# Patient Record
Sex: Female | Born: 1935 | Race: White | Hispanic: No | Marital: Married | Smoking: Former smoker
Health system: Southern US, Community
[De-identification: ages and names within clinical notes are randomized; demographics above are authoritative.]

## PROBLEM LIST (undated history)

## (undated) DIAGNOSIS — I471 Supraventricular tachycardia, unspecified: Secondary | ICD-10-CM

## (undated) DIAGNOSIS — C50919 Malignant neoplasm of unspecified site of unspecified female breast: Secondary | ICD-10-CM

## (undated) DIAGNOSIS — D649 Anemia, unspecified: Secondary | ICD-10-CM

## (undated) DIAGNOSIS — M1990 Unspecified osteoarthritis, unspecified site: Secondary | ICD-10-CM

## (undated) DIAGNOSIS — C9210 Chronic myeloid leukemia, BCR/ABL-positive, not having achieved remission: Secondary | ICD-10-CM

## (undated) HISTORY — PX: EXCISION MORTON'S NEUROMA: SHX5013

## (undated) HISTORY — PX: BREAST SURGERY: SHX581

## (undated) HISTORY — DX: Malignant neoplasm of unspecified site of unspecified female breast: C50.919

## (undated) HISTORY — DX: Unspecified osteoarthritis, unspecified site: M19.90

## (undated) HISTORY — DX: Anemia, unspecified: D64.9

## (undated) HISTORY — PX: KNEE SURGERY: SHX244

## (undated) HISTORY — PX: APPENDECTOMY: SHX54

## (undated) HISTORY — PX: COLONOSCOPY: SHX174

## (undated) HISTORY — DX: Chronic myeloid leukemia, BCR/ABL-positive, not having achieved remission: C92.10

## (undated) HISTORY — PX: ABDOMINAL HYSTERECTOMY: SHX81

## (undated) HISTORY — PX: OTHER SURGICAL HISTORY: SHX169

---

## 1991-05-01 DIAGNOSIS — C50919 Malignant neoplasm of unspecified site of unspecified female breast: Secondary | ICD-10-CM

## 1991-05-01 HISTORY — DX: Malignant neoplasm of unspecified site of unspecified female breast: C50.919

## 1998-06-14 ENCOUNTER — Other Ambulatory Visit: Admission: RE | Admit: 1998-06-14 | Discharge: 1998-06-14 | Payer: Self-pay | Admitting: *Deleted

## 1998-07-01 ENCOUNTER — Ambulatory Visit (HOSPITAL_COMMUNITY): Admission: RE | Admit: 1998-07-01 | Discharge: 1998-07-01 | Payer: Self-pay | Admitting: Gastroenterology

## 1999-08-31 ENCOUNTER — Other Ambulatory Visit: Admission: RE | Admit: 1999-08-31 | Discharge: 1999-08-31 | Payer: Self-pay | Admitting: *Deleted

## 2000-08-30 ENCOUNTER — Other Ambulatory Visit: Admission: RE | Admit: 2000-08-30 | Discharge: 2000-08-30 | Payer: Self-pay | Admitting: *Deleted

## 2001-09-08 ENCOUNTER — Other Ambulatory Visit: Admission: RE | Admit: 2001-09-08 | Discharge: 2001-09-08 | Payer: Self-pay | Admitting: *Deleted

## 2003-02-25 ENCOUNTER — Encounter: Payer: Self-pay | Admitting: Internal Medicine

## 2003-03-18 ENCOUNTER — Other Ambulatory Visit: Admission: RE | Admit: 2003-03-18 | Discharge: 2003-03-18 | Payer: Self-pay | Admitting: Oncology

## 2003-03-18 ENCOUNTER — Encounter (INDEPENDENT_AMBULATORY_CARE_PROVIDER_SITE_OTHER): Payer: Self-pay | Admitting: *Deleted

## 2003-03-19 ENCOUNTER — Ambulatory Visit (HOSPITAL_COMMUNITY): Admission: RE | Admit: 2003-03-19 | Discharge: 2003-03-19 | Payer: Self-pay | Admitting: Oncology

## 2003-07-07 ENCOUNTER — Other Ambulatory Visit: Admission: RE | Admit: 2003-07-07 | Discharge: 2003-07-07 | Payer: Self-pay | Admitting: Oncology

## 2003-08-12 ENCOUNTER — Other Ambulatory Visit: Admission: RE | Admit: 2003-08-12 | Discharge: 2003-08-12 | Payer: Self-pay | Admitting: *Deleted

## 2003-09-03 ENCOUNTER — Encounter: Payer: Self-pay | Admitting: Internal Medicine

## 2004-04-11 ENCOUNTER — Ambulatory Visit: Payer: Self-pay | Admitting: Internal Medicine

## 2004-04-13 ENCOUNTER — Ambulatory Visit: Payer: Self-pay | Admitting: Oncology

## 2004-07-07 ENCOUNTER — Ambulatory Visit: Payer: Self-pay | Admitting: Oncology

## 2004-07-21 ENCOUNTER — Encounter (INDEPENDENT_AMBULATORY_CARE_PROVIDER_SITE_OTHER): Payer: Self-pay | Admitting: Specialist

## 2004-07-21 ENCOUNTER — Ambulatory Visit (HOSPITAL_COMMUNITY): Admission: RE | Admit: 2004-07-21 | Discharge: 2004-07-21 | Payer: Self-pay | Admitting: Oncology

## 2004-08-16 ENCOUNTER — Ambulatory Visit: Payer: Self-pay | Admitting: Internal Medicine

## 2004-09-07 ENCOUNTER — Ambulatory Visit: Payer: Self-pay | Admitting: Oncology

## 2005-04-04 ENCOUNTER — Ambulatory Visit: Payer: Self-pay | Admitting: Oncology

## 2005-04-05 ENCOUNTER — Ambulatory Visit: Payer: Self-pay | Admitting: Internal Medicine

## 2005-05-18 ENCOUNTER — Ambulatory Visit: Payer: Self-pay | Admitting: Oncology

## 2005-08-20 ENCOUNTER — Ambulatory Visit: Payer: Self-pay | Admitting: Oncology

## 2005-08-27 ENCOUNTER — Ambulatory Visit (HOSPITAL_COMMUNITY): Admission: RE | Admit: 2005-08-27 | Discharge: 2005-08-27 | Payer: Self-pay | Admitting: Oncology

## 2005-08-27 ENCOUNTER — Encounter (INDEPENDENT_AMBULATORY_CARE_PROVIDER_SITE_OTHER): Payer: Self-pay | Admitting: Specialist

## 2006-03-29 ENCOUNTER — Ambulatory Visit: Payer: Self-pay | Admitting: Oncology

## 2006-04-02 LAB — CBC WITH DIFFERENTIAL (CANCER CENTER ONLY)
BASO%: 0.3 % (ref 0.0–2.0)
EOS%: 2 % (ref 0.0–7.0)
HCT: 35.2 % (ref 34.8–46.6)
LYMPH#: 1 10*3/uL (ref 0.9–3.3)
LYMPH%: 33.9 % (ref 14.0–48.0)
MCHC: 33.7 g/dL (ref 32.0–36.0)
MCV: 111 fL — ABNORMAL HIGH (ref 81–101)
MONO#: 0.3 10*3/uL (ref 0.1–0.9)
NEUT%: 53.4 % (ref 39.6–80.0)
Platelets: 153 10*3/uL (ref 145–400)
RDW: 11.4 % (ref 10.5–14.6)
WBC: 2.9 10*3/uL — ABNORMAL LOW (ref 3.9–10.0)

## 2006-04-04 LAB — COMPREHENSIVE METABOLIC PANEL
ALT: 18 U/L (ref 0–35)
Albumin: 4.7 g/dL (ref 3.5–5.2)
CO2: 27 mEq/L (ref 19–32)
Calcium: 9 mg/dL (ref 8.4–10.5)
Chloride: 104 mEq/L (ref 96–112)
Creatinine, Ser: 0.96 mg/dL (ref 0.40–1.20)
Potassium: 4.2 mEq/L (ref 3.5–5.3)
Sodium: 141 mEq/L (ref 135–145)
Total Protein: 7 g/dL (ref 6.0–8.3)

## 2006-04-04 LAB — LEUKOCYTE ALKALINE PHOS: Leukocyte Alkaline  Phos Stain: 152 — ABNORMAL HIGH (ref 33–149)

## 2006-06-25 ENCOUNTER — Emergency Department (HOSPITAL_COMMUNITY): Admission: EM | Admit: 2006-06-25 | Discharge: 2006-06-25 | Payer: Self-pay | Admitting: Emergency Medicine

## 2006-06-27 ENCOUNTER — Observation Stay (HOSPITAL_COMMUNITY): Admission: AD | Admit: 2006-06-27 | Discharge: 2006-06-28 | Payer: Self-pay | Admitting: Internal Medicine

## 2006-06-27 ENCOUNTER — Ambulatory Visit: Payer: Self-pay | Admitting: Internal Medicine

## 2006-06-28 ENCOUNTER — Ambulatory Visit: Payer: Self-pay | Admitting: Vascular Surgery

## 2006-07-10 ENCOUNTER — Ambulatory Visit: Payer: Self-pay | Admitting: Internal Medicine

## 2006-07-10 LAB — CONVERTED CEMR LAB
Cholesterol: 170 mg/dL (ref 0–200)
Glucose, Bld: 92 mg/dL (ref 70–99)
HDL: 63.4 mg/dL (ref >39.0)
LDL Cholesterol: 96 mg/dL (ref 0–99)
Total CHOL/HDL Ratio: 2.7
Triglycerides: 52 mg/dL (ref 0–149)
VLDL: 10 mg/dL (ref 0–40)

## 2006-07-15 ENCOUNTER — Ambulatory Visit: Payer: Self-pay | Admitting: Internal Medicine

## 2006-07-15 LAB — CONVERTED CEMR LAB
Basophils Absolute: 0 10*3/uL (ref 0.0–0.1)
Basophils Relative: 0.3 % (ref 0.0–1.0)
Eosinophils Absolute: 0 10*3/uL (ref 0.0–0.6)
Eosinophils Relative: 0.7 % (ref 0.0–5.0)
HCT: 33.1 % — ABNORMAL LOW (ref 36.0–46.0)
Hemoglobin: 11.4 g/dL — ABNORMAL LOW (ref 12.0–15.0)
Lymphocytes Relative: 21.2 % (ref 12.0–46.0)
MCHC: 34.5 g/dL (ref 30.0–36.0)
MCV: 112 fL — ABNORMAL HIGH (ref 78.0–100.0)
Monocytes Absolute: 0.2 10*3/uL (ref 0.2–0.7)
Monocytes Relative: 7.3 % (ref 3.0–11.0)
Neutro Abs: 2.2 10*3/uL (ref 1.4–7.7)
Neutrophils Relative %: 70.5 % (ref 43.0–77.0)
Platelets: 185 10*3/uL (ref 150–400)
RBC: 2.95 M/uL — ABNORMAL LOW (ref 3.87–5.11)
RDW: 13.9 % (ref 11.5–14.6)
WBC: 3.1 10*3/uL — ABNORMAL LOW (ref 4.5–10.5)

## 2006-07-16 ENCOUNTER — Encounter: Payer: Self-pay | Admitting: Internal Medicine

## 2006-07-18 ENCOUNTER — Ambulatory Visit: Payer: Self-pay | Admitting: Internal Medicine

## 2006-09-02 ENCOUNTER — Ambulatory Visit: Payer: Self-pay | Admitting: Oncology

## 2006-09-03 ENCOUNTER — Encounter: Payer: Self-pay | Admitting: Internal Medicine

## 2006-09-03 LAB — CBC WITH DIFFERENTIAL (CANCER CENTER ONLY)
BASO%: 0.3 % (ref 0.0–2.0)
EOS%: 3.6 % (ref 0.0–7.0)
LYMPH#: 0.8 10*3/uL — ABNORMAL LOW (ref 0.9–3.3)
MCHC: 34.2 g/dL (ref 32.0–36.0)
MONO#: 0.3 10*3/uL (ref 0.1–0.9)
NEUT#: 1.4 10*3/uL — ABNORMAL LOW (ref 1.5–6.5)
RDW: 12.4 % (ref 10.5–14.6)
WBC: 2.6 10*3/uL — ABNORMAL LOW (ref 3.9–10.0)

## 2006-09-09 LAB — COMPREHENSIVE METABOLIC PANEL
ALT: 18 U/L (ref 0–35)
AST: 26 U/L (ref 0–37)
Creatinine, Ser: 0.92 mg/dL (ref 0.40–1.20)
Total Bilirubin: 0.6 mg/dL (ref 0.3–1.2)

## 2006-09-13 ENCOUNTER — Ambulatory Visit (HOSPITAL_COMMUNITY): Admission: RE | Admit: 2006-09-13 | Discharge: 2006-09-13 | Payer: Self-pay | Admitting: Oncology

## 2006-09-13 ENCOUNTER — Encounter (INDEPENDENT_AMBULATORY_CARE_PROVIDER_SITE_OTHER): Payer: Self-pay | Admitting: Interventional Radiology

## 2007-02-14 ENCOUNTER — Telehealth (INDEPENDENT_AMBULATORY_CARE_PROVIDER_SITE_OTHER): Payer: Self-pay | Admitting: *Deleted

## 2007-02-18 DIAGNOSIS — L719 Rosacea, unspecified: Secondary | ICD-10-CM

## 2007-02-18 DIAGNOSIS — C9210 Chronic myeloid leukemia, BCR/ABL-positive, not having achieved remission: Secondary | ICD-10-CM

## 2007-02-20 ENCOUNTER — Ambulatory Visit: Payer: Self-pay | Admitting: Internal Medicine

## 2007-03-04 ENCOUNTER — Telehealth (INDEPENDENT_AMBULATORY_CARE_PROVIDER_SITE_OTHER): Payer: Self-pay | Admitting: *Deleted

## 2007-03-05 ENCOUNTER — Telehealth (INDEPENDENT_AMBULATORY_CARE_PROVIDER_SITE_OTHER): Payer: Self-pay | Admitting: *Deleted

## 2007-03-05 ENCOUNTER — Encounter: Payer: Self-pay | Admitting: Internal Medicine

## 2007-03-07 ENCOUNTER — Encounter (INDEPENDENT_AMBULATORY_CARE_PROVIDER_SITE_OTHER): Payer: Self-pay | Admitting: *Deleted

## 2007-03-11 ENCOUNTER — Telehealth (INDEPENDENT_AMBULATORY_CARE_PROVIDER_SITE_OTHER): Payer: Self-pay | Admitting: *Deleted

## 2007-03-12 ENCOUNTER — Telehealth (INDEPENDENT_AMBULATORY_CARE_PROVIDER_SITE_OTHER): Payer: Self-pay | Admitting: *Deleted

## 2007-04-08 ENCOUNTER — Ambulatory Visit: Payer: Self-pay | Admitting: Oncology

## 2007-04-08 LAB — CBC WITH DIFFERENTIAL/PLATELET
Basophils Absolute: 0.1 10*3/uL (ref 0.0–0.1)
Eosinophils Absolute: 0.1 10*3/uL (ref 0.0–0.5)
HGB: 10.7 g/dL — ABNORMAL LOW (ref 11.6–15.9)
MCV: 108.3 fL — ABNORMAL HIGH (ref 81.0–101.0)
MONO#: 0.4 10*3/uL (ref 0.1–0.9)
MONO%: 9.2 % (ref 0.0–13.0)
NEUT#: 2.3 10*3/uL (ref 1.5–6.5)
RBC: 2.81 10*6/uL — ABNORMAL LOW (ref 3.70–5.32)
RDW: 14.8 % — ABNORMAL HIGH (ref 11.3–14.5)
WBC: 4 10*3/uL (ref 3.9–10.0)

## 2007-04-09 ENCOUNTER — Ambulatory Visit: Payer: Self-pay | Admitting: Oncology

## 2007-04-09 ENCOUNTER — Encounter: Payer: Self-pay | Admitting: Internal Medicine

## 2007-04-11 ENCOUNTER — Encounter: Payer: Self-pay | Admitting: Internal Medicine

## 2007-04-14 LAB — COMPREHENSIVE METABOLIC PANEL
AST: 25 U/L (ref 0–37)
Albumin: 4 g/dL (ref 3.5–5.2)
BUN: 22 mg/dL (ref 6–23)
Calcium: 8.9 mg/dL (ref 8.4–10.5)
Chloride: 104 mEq/L (ref 96–112)
Glucose, Bld: 90 mg/dL (ref 70–99)
Potassium: 4 mEq/L (ref 3.5–5.3)
Sodium: 143 mEq/L (ref 135–145)
Total Protein: 6.8 g/dL (ref 6.0–8.3)

## 2007-04-14 LAB — BCR/ABL GENE REARRANGEMENT QNT, PCR: BCR/ABL Previous Quant 1: POSITIVE — AB

## 2007-05-05 ENCOUNTER — Ambulatory Visit: Payer: Self-pay | Admitting: Gastroenterology

## 2007-05-06 ENCOUNTER — Encounter: Payer: Self-pay | Admitting: Oncology

## 2007-05-06 ENCOUNTER — Other Ambulatory Visit: Admission: RE | Admit: 2007-05-06 | Discharge: 2007-05-06 | Payer: Self-pay | Admitting: Oncology

## 2007-05-06 LAB — CBC WITH DIFFERENTIAL/PLATELET
Basophils Absolute: 0 10*3/uL (ref 0.0–0.1)
Eosinophils Absolute: 0.1 10*3/uL (ref 0.0–0.5)
HCT: 31.9 % — ABNORMAL LOW (ref 34.8–46.6)
HGB: 11.2 g/dL — ABNORMAL LOW (ref 11.6–15.9)
LYMPH%: 32.8 % (ref 14.0–48.0)
MCV: 107.1 fL — ABNORMAL HIGH (ref 81.0–101.0)
MONO#: 0.5 10*3/uL (ref 0.1–0.9)
MONO%: 12.4 % (ref 0.0–13.0)
NEUT#: 1.9 10*3/uL (ref 1.5–6.5)
NEUT%: 51.9 % (ref 39.6–76.8)
Platelets: 184 10*3/uL (ref 145–400)
RBC: 2.98 10*6/uL — ABNORMAL LOW (ref 3.70–5.32)
WBC: 3.6 10*3/uL — ABNORMAL LOW (ref 3.9–10.0)

## 2007-05-13 ENCOUNTER — Ambulatory Visit: Payer: Self-pay | Admitting: Gastroenterology

## 2007-05-13 ENCOUNTER — Encounter: Payer: Self-pay | Admitting: Gastroenterology

## 2007-05-13 ENCOUNTER — Encounter: Payer: Self-pay | Admitting: Internal Medicine

## 2007-05-13 LAB — HM COLONOSCOPY

## 2007-05-20 ENCOUNTER — Ambulatory Visit: Payer: Self-pay | Admitting: Gastroenterology

## 2007-06-03 ENCOUNTER — Ambulatory Visit: Payer: Self-pay | Admitting: Oncology

## 2007-06-06 LAB — CBC WITH DIFFERENTIAL (CANCER CENTER ONLY)
BASO%: 0.5 % (ref 0.0–2.0)
HCT: 33.3 % — ABNORMAL LOW (ref 34.8–46.6)
LYMPH%: 26.1 % (ref 14.0–48.0)
MCH: 35.7 pg — ABNORMAL HIGH (ref 26.0–34.0)
MCHC: 33.7 g/dL (ref 32.0–36.0)
MCV: 106 fL — ABNORMAL HIGH (ref 81–101)
MONO#: 0.5 10*3/uL (ref 0.1–0.9)
MONO%: 12.5 % (ref 0.0–13.0)
NEUT%: 56 % (ref 39.6–80.0)
Platelets: 177 10*3/uL (ref 145–400)
RDW: 11.6 % (ref 10.5–14.6)

## 2007-07-01 ENCOUNTER — Encounter: Payer: Self-pay | Admitting: Internal Medicine

## 2007-07-09 ENCOUNTER — Ambulatory Visit: Payer: Self-pay | Admitting: Gastroenterology

## 2007-08-19 ENCOUNTER — Ambulatory Visit: Payer: Self-pay | Admitting: Oncology

## 2007-08-22 LAB — CBC WITH DIFFERENTIAL/PLATELET
BASO%: 1.9 % (ref 0.0–2.0)
HCT: 34.6 % — ABNORMAL LOW (ref 34.8–46.6)
MCHC: 35.3 g/dL (ref 32.0–36.0)
MONO#: 0.4 10*3/uL (ref 0.1–0.9)
NEUT%: 52 % (ref 39.6–76.8)
WBC: 3.8 10*3/uL — ABNORMAL LOW (ref 3.9–10.0)
lymph#: 1.2 10*3/uL (ref 0.9–3.3)

## 2007-08-22 LAB — COMPREHENSIVE METABOLIC PANEL
CO2: 27 mEq/L (ref 19–32)
Creatinine, Ser: 0.83 mg/dL (ref 0.40–1.20)
Glucose, Bld: 97 mg/dL (ref 70–99)
Sodium: 140 mEq/L (ref 135–145)
Total Bilirubin: 0.6 mg/dL (ref 0.3–1.2)
Total Protein: 7.1 g/dL (ref 6.0–8.3)

## 2007-09-01 ENCOUNTER — Ambulatory Visit: Payer: Self-pay | Admitting: Gastroenterology

## 2007-09-08 LAB — BCR/ABL

## 2007-10-01 ENCOUNTER — Ambulatory Visit: Payer: Self-pay | Admitting: Oncology

## 2007-10-06 ENCOUNTER — Encounter: Payer: Self-pay | Admitting: Internal Medicine

## 2007-10-06 ENCOUNTER — Encounter: Payer: Self-pay | Admitting: Gastroenterology

## 2007-10-08 ENCOUNTER — Telehealth: Payer: Self-pay | Admitting: Gastroenterology

## 2008-01-19 IMAGING — CT CT BIOPSY
1 series · 14 of 16 positions shown, 20 images · non-contrast
Comparison: none

CLINICAL DATA: Chronic myelogenous leukemia. The patient requires bone marrow biopsy to establish current status of disease. 
 CT-GUIDED BONE MARROW BIOPSY OF LEFT ILIAC BONE:
 Procedure:  Prior to the procedure, informed consent was obtained. 
 Sedation:  2 mg IV Versed, 100 mcg IV fentanyl 
 Total Moderate Sedation Time:  15 minutes
TECHNIQUE: The patient was placed in a prone position and preliminary CT performed of the bony pelvis without contrast. After choosing a site along the posterior left iliac ring, the skin was sterilely prepped and draped. Local anesthesia was provided with 1% lidocaine. 
 An 11-gauge bone-cutting needle was advanced into the posterior left iliac bone. After confirming position of the needle by CT fluoroscopy, both heparinized and nonheparinized aspirate samples were obtained.  This was followed by core biopsy with coaxial placement of a 14-gauge device.   Two core samples were obtained and submitted. After the procedure, the outer needle was removed. 
 Complications: None.

[Series 4: (hospital) 6.0 b30f · axial · 1.48mm/px · z∈[-88,-84]mm · 14 of 16 slices shown, 20 images]
[im 2/16  soft-tissue]
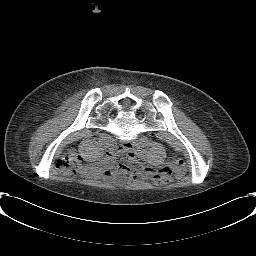
[im 2/16  bone]
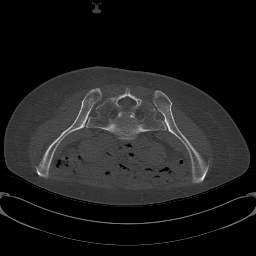
[im 3/16  soft-tissue]
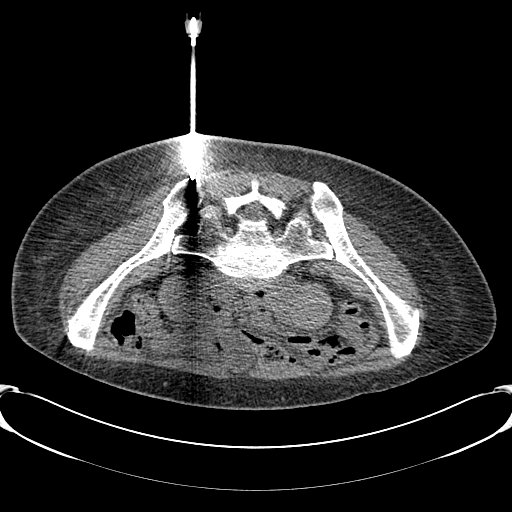
[im 4/16  soft-tissue]
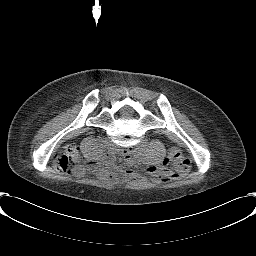
[im 5/16  soft-tissue]
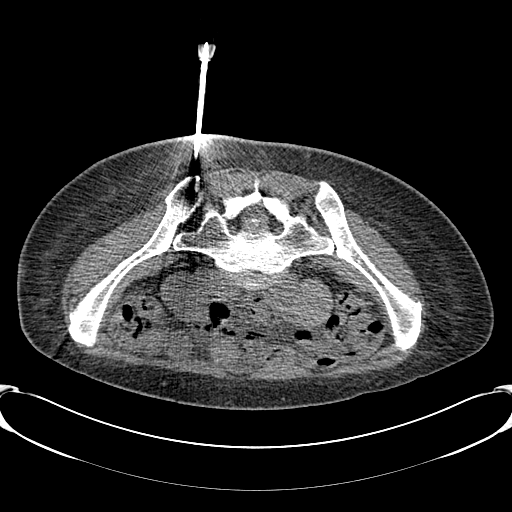
[im 6/16  soft-tissue]
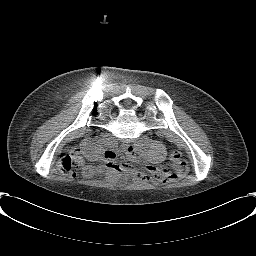
[im 7/16  soft-tissue]
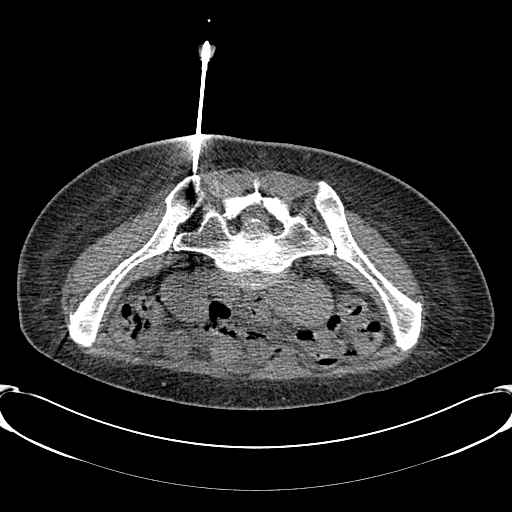
[im 8/16  soft-tissue]
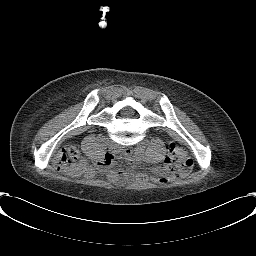
[im 9/16  soft-tissue]
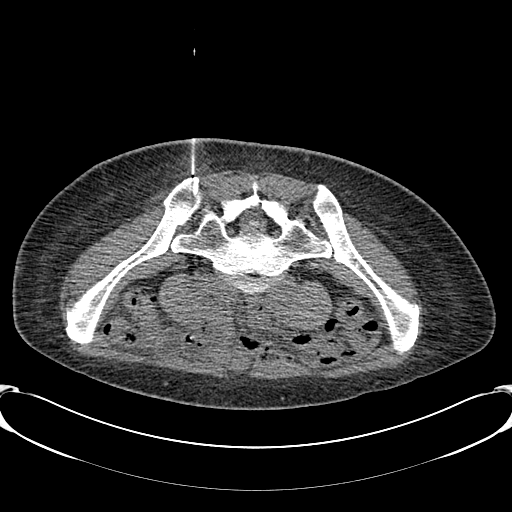
[im 9/16  lung]
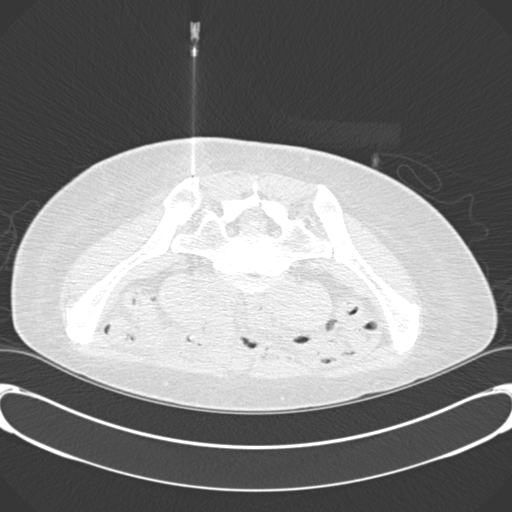
[im 10/16  soft-tissue]
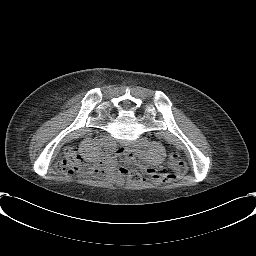
[im 10/16  bone]
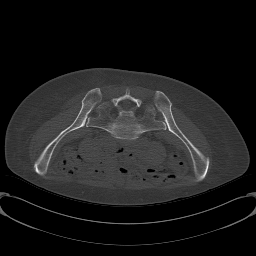
[im 11/16  soft-tissue]
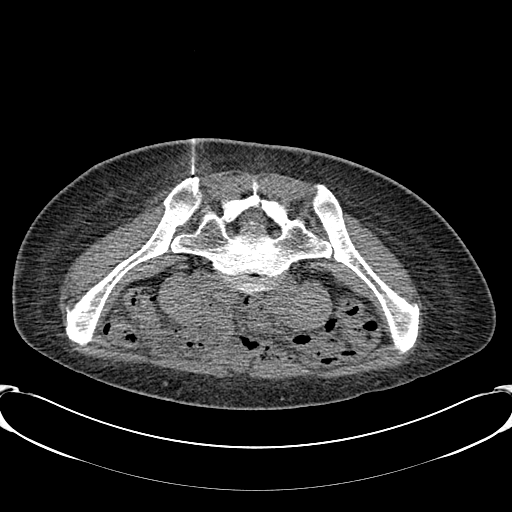
[im 12/16  soft-tissue]
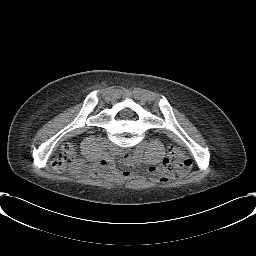
[im 13/16  soft-tissue]
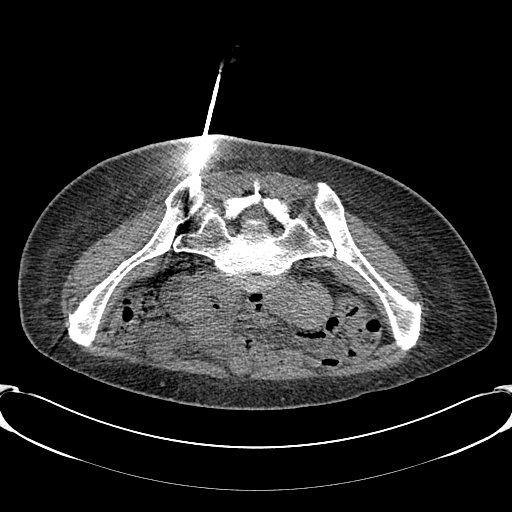
[im 13/16  lung]
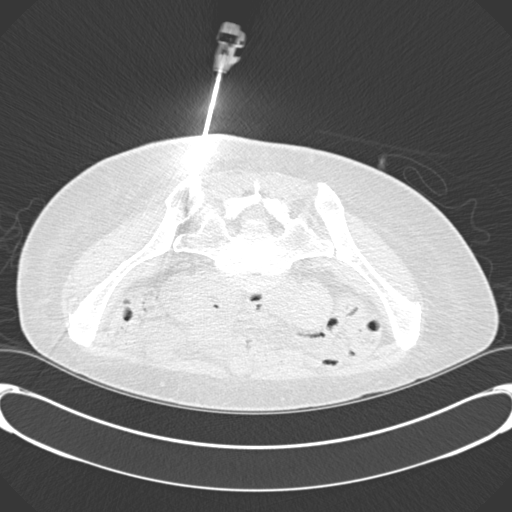
[im 14/16  soft-tissue]
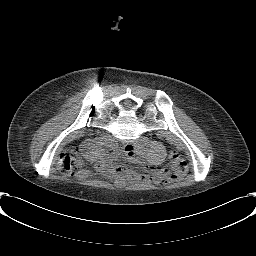
[im 14/16  lung]
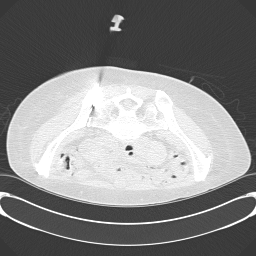
[im 15/16  soft-tissue]
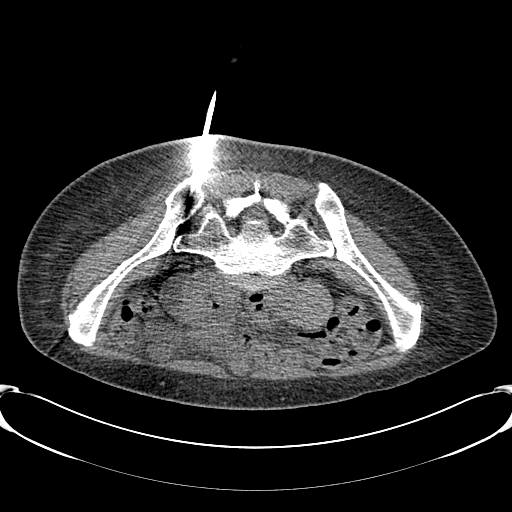
[im 15/16  lung]
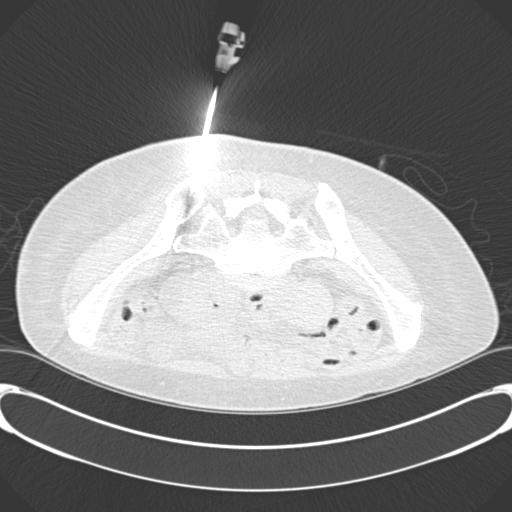

[14 of 16 positions shown; findings below may reference images not displayed]

FINDINGS: Gross inspection of the nonheparinized aspirate showed adequate particulate material. Two intact 14-gauge bone core samples were also able to be obtained.
IMPRESSION: CT-guided bone marrow biopsy of posterior left iliac bone. Both aspirate and core biopsy samples were obtained.

## 2008-04-06 ENCOUNTER — Ambulatory Visit: Payer: Self-pay | Admitting: Internal Medicine

## 2008-04-06 DIAGNOSIS — K5010 Crohn's disease of large intestine without complications: Secondary | ICD-10-CM | POA: Insufficient documentation

## 2008-04-06 DIAGNOSIS — K5730 Diverticulosis of large intestine without perforation or abscess without bleeding: Secondary | ICD-10-CM | POA: Insufficient documentation

## 2008-04-12 ENCOUNTER — Encounter: Payer: Self-pay | Admitting: Internal Medicine

## 2008-04-13 ENCOUNTER — Telehealth: Payer: Self-pay | Admitting: Gastroenterology

## 2008-04-15 ENCOUNTER — Ambulatory Visit: Payer: Self-pay | Admitting: Oncology

## 2008-04-15 ENCOUNTER — Encounter: Payer: Self-pay | Admitting: Gastroenterology

## 2008-04-19 ENCOUNTER — Encounter: Payer: Self-pay | Admitting: Internal Medicine

## 2008-04-27 ENCOUNTER — Ambulatory Visit: Payer: Self-pay | Admitting: Oncology

## 2008-04-30 HISTORY — PX: BILATERAL SALPINGOOPHORECTOMY: SHX1223

## 2008-04-30 HISTORY — PX: OTHER SURGICAL HISTORY: SHX169

## 2008-04-30 LAB — HM COLONOSCOPY

## 2008-05-06 ENCOUNTER — Encounter: Payer: Self-pay | Admitting: Internal Medicine

## 2008-05-06 ENCOUNTER — Encounter: Payer: Self-pay | Admitting: Gastroenterology

## 2008-05-14 DIAGNOSIS — K648 Other hemorrhoids: Secondary | ICD-10-CM | POA: Insufficient documentation

## 2008-05-19 ENCOUNTER — Ambulatory Visit: Payer: Self-pay | Admitting: Gastroenterology

## 2008-05-26 ENCOUNTER — Encounter: Payer: Self-pay | Admitting: Gastroenterology

## 2008-05-26 ENCOUNTER — Ambulatory Visit (HOSPITAL_COMMUNITY): Admission: RE | Admit: 2008-05-26 | Discharge: 2008-05-26 | Payer: Self-pay | Admitting: Gastroenterology

## 2008-06-30 ENCOUNTER — Ambulatory Visit: Payer: Self-pay | Admitting: Gastroenterology

## 2008-08-25 ENCOUNTER — Ambulatory Visit: Payer: Self-pay | Admitting: Hematology

## 2008-09-02 ENCOUNTER — Ambulatory Visit: Payer: Self-pay | Admitting: Oncology

## 2008-09-07 ENCOUNTER — Encounter: Payer: Self-pay | Admitting: Gastroenterology

## 2008-09-20 ENCOUNTER — Telehealth: Payer: Self-pay | Admitting: Gastroenterology

## 2008-10-08 ENCOUNTER — Telehealth: Payer: Self-pay | Admitting: Gastroenterology

## 2008-11-09 ENCOUNTER — Ambulatory Visit: Payer: Self-pay | Admitting: Hematology

## 2008-11-10 ENCOUNTER — Ambulatory Visit: Payer: Self-pay | Admitting: Gastroenterology

## 2008-11-10 DIAGNOSIS — N824 Other female intestinal-genital tract fistulae: Secondary | ICD-10-CM

## 2008-11-11 LAB — CBC WITH DIFFERENTIAL/PLATELET
Basophils Absolute: 0 10*3/uL (ref 0.0–0.1)
Eosinophils Absolute: 0 10*3/uL (ref 0.0–0.5)
HCT: 34.8 % (ref 34.8–46.6)
HGB: 12 g/dL (ref 11.6–15.9)
LYMPH%: 26.5 % (ref 14.0–49.7)
MCV: 111.8 fL — ABNORMAL HIGH (ref 79.5–101.0)
MONO%: 11 % (ref 0.0–14.0)
NEUT#: 2.7 10*3/uL (ref 1.5–6.5)
NEUT%: 61.3 % (ref 38.4–76.8)
Platelets: 175 10*3/uL (ref 145–400)

## 2008-11-15 ENCOUNTER — Encounter: Payer: Self-pay | Admitting: Internal Medicine

## 2008-11-18 ENCOUNTER — Ambulatory Visit: Payer: Self-pay | Admitting: Oncology

## 2008-11-22 ENCOUNTER — Encounter: Payer: Self-pay | Admitting: Gastroenterology

## 2009-01-04 ENCOUNTER — Encounter: Payer: Self-pay | Admitting: Internal Medicine

## 2009-04-04 ENCOUNTER — Ambulatory Visit: Payer: Self-pay | Admitting: Hematology

## 2009-04-06 ENCOUNTER — Encounter: Payer: Self-pay | Admitting: Internal Medicine

## 2009-04-06 LAB — COMPREHENSIVE METABOLIC PANEL
AST: 18 U/L (ref 0–37)
Alkaline Phosphatase: 54 U/L (ref 39–117)
BUN: 24 mg/dL — ABNORMAL HIGH (ref 6–23)
Creatinine, Ser: 1.02 mg/dL (ref 0.40–1.20)

## 2009-04-11 LAB — CONVERTED CEMR LAB
ALT: 16 units/L (ref 0–35)
AST: 17 units/L (ref 0–37)
Albumin: 4.5 g/dL (ref 3.5–5.2)
Alkaline Phosphatase: 53 units/L (ref 39–117)
Basophils Absolute: 0 10*3/uL (ref 0.0–0.1)
Basophils Relative: 0 % (ref 0–1)
Bilirubin, Direct: 0.2 mg/dL (ref 0.0–0.3)
Cholesterol: 176 mg/dL (ref 0–200)
Eosinophils Absolute: 0 10*3/uL (ref 0.0–0.7)
Eosinophils Relative: 1 % (ref 0–5)
HCT: 31.5 % — ABNORMAL LOW (ref 36.0–46.0)
HDL: 92 mg/dL (ref >39)
Hemoglobin: 10.5 g/dL — ABNORMAL LOW (ref 12.0–15.0)
Indirect Bilirubin: 0.5 mg/dL (ref 0.0–0.9)
LDL Cholesterol: 68 mg/dL (ref 0–99)
Lymphocytes Relative: 20 % (ref 12–46)
Lymphs Abs: 0.9 10*3/uL (ref 0.7–4.0)
MCHC: 33.3 g/dL (ref 30.0–36.0)
MCV: 105.7 fL — ABNORMAL HIGH (ref 78.0–100.0)
Monocytes Absolute: 0.6 10*3/uL (ref 0.1–1.0)
Monocytes Relative: 14 % — ABNORMAL HIGH (ref 3–12)
Neutro Abs: 2.9 10*3/uL (ref 1.7–7.7)
Neutrophils Relative %: 65 % (ref 43–77)
Platelets: 151 10*3/uL (ref 150–400)
RBC: 2.98 M/uL — ABNORMAL LOW (ref 3.87–5.11)
RDW: 16 % — ABNORMAL HIGH (ref 11.5–15.5)
TSH: 0.896 microintl units/mL (ref 0.350–4.500)
Total Bilirubin: 0.7 mg/dL (ref 0.3–1.2)
Total CHOL/HDL Ratio: 1.9
Total Protein: 7.1 g/dL (ref 6.0–8.3)
Triglycerides: 79 mg/dL (ref <150)
VLDL: 16 mg/dL (ref 0–40)
WBC: 4.5 10*3/uL (ref 4.0–10.5)

## 2009-04-13 ENCOUNTER — Encounter: Payer: Self-pay | Admitting: Internal Medicine

## 2009-04-14 ENCOUNTER — Encounter: Payer: Self-pay | Admitting: Internal Medicine

## 2009-04-14 DIAGNOSIS — H53129 Transient visual loss, unspecified eye: Secondary | ICD-10-CM

## 2009-04-14 DIAGNOSIS — E785 Hyperlipidemia, unspecified: Secondary | ICD-10-CM

## 2009-04-14 DIAGNOSIS — T451X5A Adverse effect of antineoplastic and immunosuppressive drugs, initial encounter: Secondary | ICD-10-CM

## 2009-04-14 DIAGNOSIS — D61810 Antineoplastic chemotherapy induced pancytopenia: Secondary | ICD-10-CM

## 2009-04-27 ENCOUNTER — Ambulatory Visit: Payer: Self-pay | Admitting: Oncology

## 2009-04-30 HISTORY — PX: OTHER SURGICAL HISTORY: SHX169

## 2009-05-03 ENCOUNTER — Encounter: Payer: Self-pay | Admitting: Gastroenterology

## 2009-05-09 ENCOUNTER — Ambulatory Visit (HOSPITAL_COMMUNITY): Admission: RE | Admit: 2009-05-09 | Discharge: 2009-05-09 | Payer: Self-pay | Admitting: Oncology

## 2009-05-12 ENCOUNTER — Encounter: Payer: Self-pay | Admitting: Gastroenterology

## 2009-05-12 LAB — CBC WITH DIFFERENTIAL (CANCER CENTER ONLY)
BASO#: 0 10*3/uL (ref 0.0–0.2)
BASO%: 0.7 % (ref 0.0–2.0)
EOS%: 11.9 % — ABNORMAL HIGH (ref 0.0–7.0)
Eosinophils Absolute: 0.5 10*3/uL (ref 0.0–0.5)
HCT: 36.2 % (ref 34.8–46.6)
HGB: 12.2 g/dL (ref 11.6–15.9)
LYMPH#: 1.1 10*3/uL (ref 0.9–3.3)
LYMPH%: 26.3 % (ref 14.0–48.0)
MCH: 36.8 pg — ABNORMAL HIGH (ref 26.0–34.0)
MCHC: 33.6 g/dL (ref 32.0–36.0)
MCV: 110 fL — ABNORMAL HIGH (ref 81–101)
MONO#: 0.5 10*3/uL (ref 0.1–0.9)
MONO%: 10.7 % (ref 0.0–13.0)
NEUT#: 2.2 10*3/uL (ref 1.5–6.5)
NEUT%: 50.4 % (ref 39.6–80.0)
Platelets: 189 10*3/uL (ref 145–400)
RBC: 3.31 10*6/uL — ABNORMAL LOW (ref 3.70–5.32)
RDW: 12.9 % (ref 10.5–14.6)
WBC: 4.3 10*3/uL (ref 3.9–10.0)

## 2009-05-26 ENCOUNTER — Encounter: Payer: Self-pay | Admitting: Internal Medicine

## 2009-05-26 ENCOUNTER — Encounter: Payer: Self-pay | Admitting: Gastroenterology

## 2009-06-10 ENCOUNTER — Encounter: Payer: Self-pay | Admitting: Internal Medicine

## 2009-06-17 ENCOUNTER — Ambulatory Visit: Payer: Self-pay | Admitting: Oncology

## 2009-06-23 ENCOUNTER — Encounter: Payer: Self-pay | Admitting: Internal Medicine

## 2009-06-24 ENCOUNTER — Encounter: Payer: Self-pay | Admitting: Internal Medicine

## 2009-06-24 ENCOUNTER — Ambulatory Visit: Payer: Self-pay | Admitting: Oncology

## 2009-06-24 LAB — CBC WITH DIFFERENTIAL/PLATELET
BASO%: 0.2 % (ref 0.0–2.0)
Basophils Absolute: 0 10*3/uL (ref 0.0–0.1)
EOS%: 1.5 % (ref 0.0–7.0)
Eosinophils Absolute: 0.1 10*3/uL (ref 0.0–0.5)
HCT: 31.7 % — ABNORMAL LOW (ref 34.8–46.6)
HGB: 10.9 g/dL — ABNORMAL LOW (ref 11.6–15.9)
LYMPH%: 21.2 % (ref 14.0–49.7)
MCH: 39.5 pg — ABNORMAL HIGH (ref 25.1–34.0)
MCHC: 34.3 g/dL (ref 31.5–36.0)
MCV: 115.4 fL — ABNORMAL HIGH (ref 79.5–101.0)
MONO#: 0.6 10*3/uL (ref 0.1–0.9)
MONO%: 12.7 % (ref 0.0–14.0)
NEUT#: 2.9 10*3/uL (ref 1.5–6.5)
NEUT%: 64.4 % (ref 38.4–76.8)
Platelets: 141 10*3/uL — ABNORMAL LOW (ref 145–400)
RBC: 2.75 10*6/uL — ABNORMAL LOW (ref 3.70–5.45)
RDW: 15 % — ABNORMAL HIGH (ref 11.2–14.5)
WBC: 4.4 10*3/uL (ref 3.9–10.3)
lymph#: 0.9 10*3/uL (ref 0.9–3.3)

## 2009-07-15 LAB — CBC WITH DIFFERENTIAL/PLATELET
BASO%: 0.3 % (ref 0.0–2.0)
Basophils Absolute: 0 10*3/uL (ref 0.0–0.1)
EOS%: 1.9 % (ref 0.0–7.0)
Eosinophils Absolute: 0.1 10*3/uL (ref 0.0–0.5)
HCT: 29.9 % — ABNORMAL LOW (ref 34.8–46.6)
HGB: 10.1 g/dL — ABNORMAL LOW (ref 11.6–15.9)
LYMPH%: 24.2 % (ref 14.0–49.7)
MCH: 38 pg — ABNORMAL HIGH (ref 25.1–34.0)
MCHC: 33.8 g/dL (ref 31.5–36.0)
MCV: 112.4 fL — ABNORMAL HIGH (ref 79.5–101.0)
MONO#: 0.5 10*3/uL (ref 0.1–0.9)
MONO%: 12.4 % (ref 0.0–14.0)
NEUT#: 2.3 10*3/uL (ref 1.5–6.5)
NEUT%: 61.2 % (ref 38.4–76.8)
Platelets: 125 10*3/uL — ABNORMAL LOW (ref 145–400)
RBC: 2.66 10*6/uL — ABNORMAL LOW (ref 3.70–5.45)
RDW: 14.7 % — ABNORMAL HIGH (ref 11.2–14.5)
WBC: 3.7 10*3/uL — ABNORMAL LOW (ref 3.9–10.3)
lymph#: 0.9 10*3/uL (ref 0.9–3.3)

## 2009-08-10 ENCOUNTER — Ambulatory Visit: Payer: Self-pay | Admitting: Oncology

## 2009-09-05 ENCOUNTER — Ambulatory Visit: Payer: Self-pay | Admitting: Oncology

## 2009-09-13 ENCOUNTER — Encounter: Payer: Self-pay | Admitting: Internal Medicine

## 2009-09-13 LAB — CBC WITH DIFFERENTIAL (CANCER CENTER ONLY)
BASO#: 0 10*3/uL (ref 0.0–0.2)
BASO%: 0.8 % (ref 0.0–2.0)
EOS%: 2.2 % (ref 0.0–7.0)
Eosinophils Absolute: 0.1 10*3/uL (ref 0.0–0.5)
HCT: 35.5 % (ref 34.8–46.6)
HGB: 12.3 g/dL (ref 11.6–15.9)
LYMPH#: 1.6 10*3/uL (ref 0.9–3.3)
LYMPH%: 32.5 % (ref 14.0–48.0)
MCH: 38.2 pg — ABNORMAL HIGH (ref 26.0–34.0)
MCHC: 34.7 g/dL (ref 32.0–36.0)
MCV: 110 fL — ABNORMAL HIGH (ref 81–101)
MONO#: 0.5 10*3/uL (ref 0.1–0.9)
MONO%: 11.2 % (ref 0.0–13.0)
NEUT#: 2.6 10*3/uL (ref 1.5–6.5)
NEUT%: 53.3 % (ref 39.6–80.0)
Platelets: 204 10*3/uL (ref 145–400)
RBC: 3.22 10*6/uL — ABNORMAL LOW (ref 3.70–5.32)
RDW: 10.7 % (ref 10.5–14.6)
WBC: 4.8 10*3/uL (ref 3.9–10.0)

## 2009-09-13 LAB — CMP (CANCER CENTER ONLY)
ALT(SGPT): 24 U/L (ref 10–47)
AST: 32 U/L (ref 11–38)
Albumin: 4 g/dL (ref 3.3–5.5)
Alkaline Phosphatase: 72 U/L (ref 26–84)
BUN, Bld: 23 mg/dL — ABNORMAL HIGH (ref 7–22)
CO2: 30 mEq/L (ref 18–33)
Calcium: 9.3 mg/dL (ref 8.0–10.3)
Chloride: 98 mEq/L (ref 98–108)
Creat: 1 mg/dl (ref 0.6–1.2)
Glucose, Bld: 94 mg/dL (ref 73–118)
Potassium: 4.3 mEq/L (ref 3.3–4.7)
Sodium: 135 mEq/L (ref 128–145)
Total Bilirubin: 0.8 mg/dl (ref 0.20–1.60)
Total Protein: 6.9 g/dL (ref 6.4–8.1)

## 2010-03-28 ENCOUNTER — Ambulatory Visit: Payer: Self-pay | Admitting: Oncology

## 2010-03-30 ENCOUNTER — Encounter: Payer: Self-pay | Admitting: Internal Medicine

## 2010-03-30 LAB — CBC WITH DIFFERENTIAL/PLATELET
BASO%: 0.5 % (ref 0.0–2.0)
Basophils Absolute: 0 10*3/uL (ref 0.0–0.1)
EOS%: 5.4 % (ref 0.0–7.0)
Eosinophils Absolute: 0.2 10*3/uL (ref 0.0–0.5)
HCT: 30.3 % — ABNORMAL LOW (ref 34.8–46.6)
HGB: 10.4 g/dL — ABNORMAL LOW (ref 11.6–15.9)
LYMPH%: 23.6 % (ref 14.0–49.7)
MCH: 39.5 pg — ABNORMAL HIGH (ref 25.1–34.0)
MCHC: 34.4 g/dL (ref 31.5–36.0)
MCV: 114.7 fL — ABNORMAL HIGH (ref 79.5–101.0)
MONO#: 0.5 10*3/uL (ref 0.1–0.9)
MONO%: 12.7 % (ref 0.0–14.0)
NEUT#: 2.5 10*3/uL (ref 1.5–6.5)
NEUT%: 57.8 % (ref 38.4–76.8)
Platelets: 158 10*3/uL (ref 145–400)
RBC: 2.64 10*6/uL — ABNORMAL LOW (ref 3.70–5.45)
RDW: 14.2 % (ref 11.2–14.5)
WBC: 4.2 10*3/uL (ref 3.9–10.3)
lymph#: 1 10*3/uL (ref 0.9–3.3)

## 2010-03-30 LAB — COMPREHENSIVE METABOLIC PANEL
ALT: 14 U/L (ref 0–35)
AST: 28 U/L (ref 0–37)
Albumin: 4.1 g/dL (ref 3.5–5.2)
Alkaline Phosphatase: 48 U/L (ref 39–117)
BUN: 23 mg/dL (ref 6–23)
CO2: 29 mEq/L (ref 19–32)
Calcium: 9 mg/dL (ref 8.4–10.5)
Chloride: 104 mEq/L (ref 96–112)
Creatinine, Ser: 0.94 mg/dL (ref 0.40–1.20)
Glucose, Bld: 98 mg/dL (ref 70–99)
Potassium: 4 mEq/L (ref 3.5–5.3)
Sodium: 140 mEq/L (ref 135–145)
Total Bilirubin: 0.6 mg/dL (ref 0.3–1.2)
Total Protein: 6.6 g/dL (ref 6.0–8.3)

## 2010-03-30 LAB — IRON AND TIBC
%SAT: 33 % (ref 20–55)
Iron: 100 ug/dL (ref 42–145)
TIBC: 304 ug/dL (ref 250–470)
UIBC: 204 ug/dL

## 2010-03-30 LAB — FERRITIN: Ferritin: 88 ng/mL (ref 10–291)

## 2010-04-07 LAB — BCR/ABL

## 2010-04-19 ENCOUNTER — Encounter: Payer: Self-pay | Admitting: Internal Medicine

## 2010-05-09 ENCOUNTER — Telehealth (INDEPENDENT_AMBULATORY_CARE_PROVIDER_SITE_OTHER): Payer: Self-pay | Admitting: *Deleted

## 2010-06-01 NOTE — Letter (Signed)
Summary: [name]  [facility]   Imported By: Lanelle Bal 06/27/2009 12:31:07  _____________________________________________________________________  External Attachment:    Type:   Image     Comment:   External Document

## 2010-06-01 NOTE — Letter (Signed)
Summary: [facility]   Imported By: Lanelle Bal 07/18/2009 12:08:40  _____________________________________________________________________  External Attachment:    Type:   Image     Comment:   External Document

## 2010-06-01 NOTE — Letter (Signed)
[facility]   Imported By: Lennie Odor 06/02/2009 11:40:05  _____________________________________________________________________  External Attachment:    Type:   Image     Comment:   External Document

## 2010-06-01 NOTE — Letter (Signed)
[facility]   Imported By: Lennie Odor 06/02/2009 11:38:17  _____________________________________________________________________  External Attachment:    Type:   Image     Comment:   External Document

## 2010-06-01 NOTE — Letter (Signed)
Summary: [facility]  [facility]   Imported By: Lanelle Bal 04/12/2010 12:15:21  _____________________________________________________________________  External Attachment:    Type:   Image     Comment:   External Document

## 2010-06-01 NOTE — Letter (Signed)
Summary: [name]  [facility]   Imported By: Lanelle Bal 06/10/2009 08:16:28  _____________________________________________________________________  External Attachment:    Type:   Image     Comment:   External Document

## 2010-06-01 NOTE — Letter (Signed)
[facility]   Imported By: Lanelle Bal 07/21/2009 10:49:24  _____________________________________________________________________  External Attachment:    Type:   Image     Comment:   External Document

## 2010-06-01 NOTE — Letter (Signed)
[facility]   Imported By: Lanelle Bal 10/06/2009 13:58:31  _____________________________________________________________________  External Attachment:    Type:   Image     Comment:   External Document

## 2010-06-01 NOTE — Progress Notes (Signed)
Summary: need lab orders and codes for 04-Jul-2022  Phone Note Call from Patient   Caller: Patient Reason for Call: Insurance Question Summary of Call: has cpx on 3/6, need lab orders and codes for 2022/07/04  --going to [facility]  thanks Initial call taken by: Jerolyn Shin,  May 09, 2010 4:29 PM  Follow-up for Phone Call        I spoke with patient and informed her that she should check with insurance first because for patient's age 74 and older labs are usually not covered prior to CPX, Patient will call back to reschedule.  Follow-up by: Shonna Chock CMA,  May 10, 2010 9:07 AM

## 2010-06-01 NOTE — Letter (Signed)
Summary: [facility]  [facility]   Imported By: Sherian Rein 06/09/2009 09:22:34  _____________________________________________________________________  External Attachment:    Type:   Image     Comment:   External Document

## 2010-07-04 ENCOUNTER — Encounter: Payer: Self-pay | Admitting: Internal Medicine

## 2010-07-04 ENCOUNTER — Encounter (INDEPENDENT_AMBULATORY_CARE_PROVIDER_SITE_OTHER): Payer: Medicare Other | Admitting: Internal Medicine

## 2010-07-04 ENCOUNTER — Other Ambulatory Visit: Payer: Self-pay | Admitting: Internal Medicine

## 2010-07-04 DIAGNOSIS — R6884 Jaw pain: Secondary | ICD-10-CM | POA: Insufficient documentation

## 2010-07-04 DIAGNOSIS — C9210 Chronic myeloid leukemia, BCR/ABL-positive, not having achieved remission: Secondary | ICD-10-CM

## 2010-07-04 DIAGNOSIS — Z0000 Encounter for general adult medical examination without abnormal findings: Secondary | ICD-10-CM

## 2010-07-04 DIAGNOSIS — E785 Hyperlipidemia, unspecified: Secondary | ICD-10-CM

## 2010-07-04 DIAGNOSIS — R1013 Epigastric pain: Secondary | ICD-10-CM

## 2010-07-04 DIAGNOSIS — E1110 Type 2 diabetes mellitus with ketoacidosis without coma: Secondary | ICD-10-CM

## 2010-07-04 DIAGNOSIS — Z136 Encounter for screening for cardiovascular disorders: Secondary | ICD-10-CM

## 2010-07-04 DIAGNOSIS — K5730 Diverticulosis of large intestine without perforation or abscess without bleeding: Secondary | ICD-10-CM

## 2010-07-04 LAB — CONVERTED CEMR LAB
Bilirubin Urine: NEGATIVE
Glucose, Urine, Semiquant: NEGATIVE
pH: 5

## 2010-07-05 LAB — CBC WITH DIFFERENTIAL/PLATELET
Basophils Absolute: 0 10*3/uL (ref 0.0–0.1)
Basophils Relative: 0.7 % (ref 0.0–3.0)
Eosinophils Absolute: 0.1 10*3/uL (ref 0.0–0.7)
Eosinophils Relative: 4.8 % (ref 0.0–5.0)
HCT: 30.8 % — ABNORMAL LOW (ref 36.0–46.0)
Hemoglobin: 10.4 g/dL — ABNORMAL LOW (ref 12.0–15.0)
Lymphocytes Relative: 48.8 % — ABNORMAL HIGH (ref 12.0–46.0)
Lymphs Abs: 1.1 10*3/uL (ref 0.7–4.0)
MCHC: 33.7 g/dL (ref 30.0–36.0)
MCV: 116.7 fl — ABNORMAL HIGH (ref 78.0–100.0)
Monocytes Absolute: 0.4 10*3/uL (ref 0.1–1.0)
Monocytes Relative: 16.5 % — ABNORMAL HIGH (ref 3.0–12.0)
Neutro Abs: 0.7 10*3/uL — ABNORMAL LOW (ref 1.4–7.7)
Neutrophils Relative %: 29.2 % — ABNORMAL LOW (ref 43.0–77.0)
Platelets: 132 10*3/uL — ABNORMAL LOW (ref 150.0–400.0)
RBC: 2.64 Mil/uL — ABNORMAL LOW (ref 3.87–5.11)
RDW: 15.1 % — ABNORMAL HIGH (ref 11.5–14.6)
WBC: 2.4 10*3/uL — ABNORMAL LOW (ref 4.5–10.5)

## 2010-07-05 LAB — HEPATIC FUNCTION PANEL
ALT: 15 U/L (ref 0–35)
AST: 29 U/L (ref 0–37)
Albumin: 4.1 g/dL (ref 3.5–5.2)
Alkaline Phosphatase: 41 U/L (ref 39–117)
Bilirubin, Direct: 0.1 mg/dL (ref 0.0–0.3)
Total Bilirubin: 0.6 mg/dL (ref 0.3–1.2)
Total Protein: 6 g/dL (ref 6.0–8.3)

## 2010-07-05 LAB — LIPID PANEL
Cholesterol: 181 mg/dL (ref 0–200)
HDL: 85.7 mg/dL (ref >39.00)
LDL Cholesterol: 86 mg/dL (ref 0–99)
Total CHOL/HDL Ratio: 2
Triglycerides: 47 mg/dL (ref 0.0–149.0)
VLDL: 9.4 mg/dL (ref 0.0–40.0)

## 2010-07-05 LAB — BASIC METABOLIC PANEL
BUN: 23 mg/dL (ref 6–23)
CO2: 28 mEq/L (ref 19–32)
Calcium: 9 mg/dL (ref 8.4–10.5)
Chloride: 106 mEq/L (ref 96–112)
Creatinine, Ser: 1.1 mg/dL (ref 0.4–1.2)
GFR: 53.86 mL/min — ABNORMAL LOW (ref >60.00)
Glucose, Bld: 96 mg/dL (ref 70–99)
Potassium: 4.9 mEq/L (ref 3.5–5.1)
Sodium: 140 mEq/L (ref 135–145)

## 2010-07-05 LAB — TSH: TSH: 2.11 u[IU]/mL (ref 0.35–5.50)

## 2010-07-11 NOTE — Assessment & Plan Note (Signed)
Summary: CPX-TO DISCUSS LABS//SPH/PH   Vital Signs:  Patient profile:   76 year old female Height:      61.75 inches Weight:      154.2 pounds BMI:     28.54 Temp:     98.2 degrees F oral Pulse rate:   76 / minute Resp:     14 per minute BP sitting:   114 / 72  (left arm) Cuff size:   regular  Vitals Entered By: Shonna Chock CMA (July 04, 2010 1:04 PM) CC: CPX with fasting labs , Ear pain, Abdominal pain Comments Patient states TDaP up-to-date through GYN. 4 years ago 2008  Vision Screening:Right Eye w/o correction: 20 / 40 Both eyes w/o correction:  20/ 40 Left eye with correction: 20 / 200       Vision Comments: Patient wears contact in left eye only: Left eye for reading only  Vision Entered By: Shonna Chock CMA (July 04, 2010 1:10 PM)   Primary Care Provider:  Marga Melnick, MD  CC:  CPX with fasting labs , Ear pain, and Abdominal pain.  History of Present Illness:    Here for Medicare AWV: 1.Risk factors based on Past M, S, F history:see Diagnoses; chart updated 2.Physical Activities: walks 6X/ week > 30 min w/o symptoms( see jaw pain below) 3.Depression/mood: no issues 4.Hearing: normal to whisper @ 6 ft 5ADL's:no limitations  6.Fall Risk: no issues 7.Home Safety:no issues  8.Height, weight, &visual acuity:see VS 9.Counseling: POA & Living Will in place 10.Labs ordered based on risk factors: see Orders 11.Referral Coordination: Gyn referral discussed; Mammograms 12/11; Glaucoma eval 05/11 Dr Elmer Picker 12. Care Plan: See Instructions 13. Cognitive Assessment: Oriented  X 3 ; memory & recall excellent; "WORLD" spelled backwards; mood & affect normal.     She has had   2 episodes of L jaw pain in  TMJ aea, last 3-4 weeks ago.  The patient denies ear discharge, sensation of fullness, hearing loss, tinnitus, fever, and jaw click.   The pain is described as intermittent.  The patient denies popping or crackling sounds, toothache, dizziness, and vertigo. Each episode  was followed by abd pain;she  denies nausea, vomiting, diarrhea, melena, and anorexia.  The location of the pain is mid  upper quadrant/ epigastric.  The pain is described as cramping in quality.  The patient denies the following symptoms: fever, weight loss, dysuria, and chest pain.  The pain is better with bicarbonate   of soda  in < 10 minutes  . P uncle : MI @ 7  Preventive Screening-Counseling & Management  Alcohol-Tobacco     Alcohol drinks/day: 1-2     Smoking Status: quit > 6 months     Packs/Day: <1.0     Year Started: 1956     Year Quit: 1962  Caffeine-Diet-Exercise     Caffeine use/day: 3 cups 1/2& 1/2  Hep-HIV-STD-Contraception     Dental Visit-last 6 months yes     Sun Exposure-Excessive: no  Safety-Violence-Falls     Seat Belt Use: yes     Smoke Detectors: yes      Blood Transfusions:  no.        Travel History:  2010 [place].    Current Medications (verified): 1)  Gleevec 100 Mg Tabs (Imatinib Mesylate) .... 4 By Mouth Once Daily 2)  Aspir-Low 81 Mg Tbec (Aspirin) .... Take 1 Tablet By Mouth Once A Day 3)  Multivitamins With Iron Liquid (Multiple Vitamin) .... Take 1 Tablet By  Mouth Once A Day 4)  Calcium 1200 Plus Vit D .... Take 1 Tablet By Mouth Once A Day 5)  Folic Acid 1 Mg Tabs (Folic Acid) .... Take 1 Tablet By Mouth Once A Day 6)  Lab Order  V70.0 .... Lipids, Hepatic Panel, Bmet, Cbc & Dif, Tsh  Allergies (verified): 1)  ! Pcn  Past History:  Past Medical History: HYPERTENSION (ICD-401.9) ROSACEA (ICD-695.3), controlled LEUKEMIA, MYELOID, CHRONIC (ICD-205.10), molecular remission, Dr Welton Flakes LEFT  SEGMENTAL COLITIS  DUE TO  DIVERTICULAR DISEASE, Dr Arlyce Dice + ANA, PMH of 2004, Warren Lacy, MD BREAST CANCER  1993 Hyperlipidemia: LDL 125 (946 total particls / 0 small dense particles ), TG  70, & HDL 71. LDL goal = < 160, ideally < 130.  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, Dr [facility] [place] , 02/02/2009; 04/2009 reversal of ileostomy  ; Arthroscopy R knee  Family History: M :colon cancer sister: uterine cancer  Family History of Diabetes: Cousin F:lip cancer ; P aunt : breast cancer; P uncle : MI @ 53  Social History: Married, 2 boys Alcohol use-yes-2 glasses  daily Regular exercise-yes Patient is a former smoker  Daily Caffeine : coffee Caffeine use/day:  3 cups 1/2& 1/2 Dental Care w/in 6 mos.:  yes Sun Exposure-Excessive:  no Seat Belt Use:  yes Blood Transfusions:  no Smoking Status:  quit > 6 months Packs/Day:  <1.0  Review of Systems  The patient denies anorexia, vision loss, decreased hearing, hoarseness, syncope, dyspnea on exertion, peripheral edema, prolonged cough, headaches, hemoptysis, melena, hematochezia, severe indigestion/heartburn, hematuria, suspicious skin lesions, unusual weight change, abnormal bleeding, enlarged lymph nodes, and angioedema.         Weight up 10 #. Intermittent loose stool, ? related to Gleevec.  Physical Exam  General:  well-nourished,in no acute distress; alert,appropriate and cooperative throughout examination Head:  Normocephalic and atraumatic without obvious abnormalities.  Eyes:  No corneal or conjunctival inflammation noted. Perrla. Funduscopic exam benign, without hemorrhages, exudates or papilledema. Ears:  External ear exam shows no significant lesions or deformities.  Otoscopic examination reveals clear canals, tympanic membranes are intact bilaterally without bulging, retraction, inflammation or discharge. Hearing is grossly normal bilaterally. Nose:  External nasal examination shows no deformity or inflammation. Nasal mucosa are pink and moist without lesions  or exudates. Mouth:  Oral mucosa and oropharynx without lesions or exudates.  Teeth in good repair.Clinically no active TMJ syndrome Neck:  No deformities, masses, or tenderness noted. Lungs:  Normal respiratory effort, chest expands symmetrically. Lungs are clear to auscultation, no crackles or wheezes. Heart:  Normal rate and regular rhythm. S1 and S2 normal without gallop, murmur, click, rub or other extra sounds. Abdomen:  Bowel sounds positive,abdomen soft and non-tender without masses, organomegaly or hernias noted. Aorta palpable w/o AAA Genitalia:  seen prev by Dr Particia Jasper group Msk:  No deformity or scoliosis noted of thoracic or lumbar spine.   Pulses:  R and L carotid,radial,dorsalis pedis and posterior tibial pulses are full and equal bilaterally Extremities:  No clubbing, cyanosis, edema, or deformity noted with normal full range of motion of all joints.   Neurologic:  alert & oriented X3 and DTRs symmetrical and normal except @ R knee (1/2+).   Skin:  Tiny cherry angiomata  on  back Cervical Nodes:  No lymphadenopathy noted Axillary Nodes:  No palpable lymphadenopathy Psych:  memory intact for recent and remote, normally interactive, and good eye contact.     Impression & Recommendations:  Problem # 1:  PREVENTIVE HEALTH CARE (ICD-V70.0)  Orders: Medicare -1st Annual Wellness Visit [phone])  Problem # 2:  JAW PAIN (ICD-784.92)  non exertional , 2 episodes < 10 min ; associated with  # 3  Orders: EKG w/ Interpretation (93000)  Problem # 3:  ABDOMINAL PAIN, EPIGASTRIC (ICD-789.06)  The following medications were removed from the medication list:    Lialda 1.2 Gm Tbec (Mesalamine) .Marland Kitchen... Take 2 by mouth once daily  Orders: TLB-CBC Platelet - w/Differential (85025-CBCD) EKG w/ Interpretation (93000)  Problem # 4:  LEUKEMIA, MYELOID, CHRONIC (ICD-205.10)  Orders: Venipuncture (40102) TLB-CBC Platelet - w/Differential (85025-CBCD)  Problem # 5:  DIVERTICULOSIS  OF COLON (ICD-562.10)  Orders: Venipuncture (72536)  Complete Medication List: 1)  Gleevec 100 Mg Tabs (Imatinib mesylate) .... 4 by mouth once daily 2)  Aspir-low 81 Mg Tbec (Aspirin) .... Take 1 tablet by mouth once a day 3)  Multivitamins With Iron Liquid (multiple Vitamin)  .... Take 1 tablet by mouth once a day 4)  Calcium 1200 Plus Vit D  .... Take 1 tablet by mouth once a day 5)  Folic Acid 1 Mg Tabs (Folic acid) .... Take 1 tablet by mouth once a day 6)  Lab Order V70.0  .... Lipids, hepatic panel, bmet, cbc & dif, tsh  Other Orders: UA Dipstick W/ Micro (manual) (64403) TLB-Lipid Panel (80061-LIPID) TLB-BMP (Basic Metabolic Panel-BMET) (80048-METABOL) TLB-Hepatic/Liver Function Pnl (80076-HEPATIC) TLB-TSH (Thyroid Stimulating Hormone) (47425-ZDG)  Patient Instructions: 1)  Use Align once daily as needed loose stools.Cort Aid on the itchy skin two times a day as needed . 2)  It is important that you exercise regularly at least 20 minutes 5 times a week. If you develop chest pain, have severe difficulty breathing, or feel very tired , stop exercising immediately and seek medical attention.Keep Diary of  any  jaw or abdominal symptoms; call if recurrent.Bone Density every 25 months.   Orders Added: 1)  UA Dipstick W/ Micro (manual) [81000] 2)  Venipuncture [36415] 3)  TLB-Lipid Panel [80061-LIPID] 4)  TLB-BMP (Basic Metabolic Panel-BMET) [80048-METABOL] 5)  TLB-CBC Platelet - w/Differential [85025-CBCD] 6)  TLB-Hepatic/Liver Function Pnl [80076-HEPATIC] 7)  TLB-TSH (Thyroid Stimulating Hormone) [84443-TSH] 8)  Medicare -1st Annual Wellness Visit [G0438] 9)  Est. Patient Level III [38756] 10)  EKG w/ Interpretation [93000]   Immunization History:  Tetanus/Td Immunization History:    Tetanus/Td:  historical (04/30/2006)  Pneumovax Immunization History:    Pneumovax:  historical (03/30/2002)   Immunization History:  Tetanus/Td Immunization History:    Tetanus/Td:   Historical (04/30/2006)  Pneumovax Immunization History:    Pneumovax:  Historical (03/30/2002)  Laboratory Results   Urine Tests   Date/Time Reported: July 04, 2010 11:48 AM   Routine Urinalysis   Color: yellow Appearance: Clear Glucose: negative   (Normal Range: Negative) Bilirubin: negative   (Normal Range: Negative) Ketone: negative   (Normal Range: Negative) Spec. Gravity: >=1.030   (Normal Range: 1.003-1.035) Blood: negative   (Normal Range: Negative) pH: 5.0   (Normal Range: 5.0-8.0) Protein: negative   (Normal Range: Negative) Urobilinogen: negative   (Normal Range: 0-1) Nitrite: negative   (Normal Range: Negative) Leukocyte Esterace: negative   (Normal Range: Negative)    Comments: Floydene Flock  July 04, 2010 11:48 AM

## 2010-07-16 LAB — APTT: aPTT: 25 seconds (ref 24–37)

## 2010-07-16 LAB — CBC
Platelets: 182 10*3/uL (ref 150–400)
RDW: 16.2 % — ABNORMAL HIGH (ref 11.5–15.5)
WBC: 3.8 10*3/uL — ABNORMAL LOW (ref 4.0–10.5)

## 2010-07-16 LAB — TISSUE HYBRIDIZATION (BONE MARROW)-NCBH

## 2010-07-16 LAB — PROTIME-INR: Prothrombin Time: 13 seconds (ref 11.6–15.2)

## 2010-07-16 LAB — BONE MARROW EXAM: Bone Marrow Exam: 18

## 2010-07-16 LAB — CHROMOSOME ANALYSIS, BONE MARROW

## 2010-09-11 ENCOUNTER — Other Ambulatory Visit: Payer: Self-pay | Admitting: Oncology

## 2010-09-11 ENCOUNTER — Encounter (HOSPITAL_BASED_OUTPATIENT_CLINIC_OR_DEPARTMENT_OTHER): Payer: Medicare Other | Admitting: Oncology

## 2010-09-11 DIAGNOSIS — C9210 Chronic myeloid leukemia, BCR/ABL-positive, not having achieved remission: Secondary | ICD-10-CM

## 2010-09-11 DIAGNOSIS — C50919 Malignant neoplasm of unspecified site of unspecified female breast: Secondary | ICD-10-CM

## 2010-09-11 LAB — CBC WITH DIFFERENTIAL/PLATELET
Eosinophils Absolute: 0.1 10*3/uL (ref 0.0–0.5)
HGB: 11.1 g/dL — ABNORMAL LOW (ref 11.6–15.9)
MCV: 115.3 fL — ABNORMAL HIGH (ref 79.5–101.0)
MONO%: 13.2 % (ref 0.0–14.0)
NEUT#: 1.9 10*3/uL (ref 1.5–6.5)
RBC: 2.81 10*6/uL — ABNORMAL LOW (ref 3.70–5.45)
RDW: 13.8 % (ref 11.2–14.5)
WBC: 3.6 10*3/uL — ABNORMAL LOW (ref 3.9–10.3)
lymph#: 1.1 10*3/uL (ref 0.9–3.3)

## 2010-09-11 LAB — COMPREHENSIVE METABOLIC PANEL
AST: 29 U/L (ref 0–37)
Albumin: 4.3 g/dL (ref 3.5–5.2)
Alkaline Phosphatase: 54 U/L (ref 39–117)
Calcium: 9.5 mg/dL (ref 8.4–10.5)
Chloride: 104 mEq/L (ref 96–112)
Glucose, Bld: 95 mg/dL (ref 70–99)
Potassium: 4.5 mEq/L (ref 3.5–5.3)
Sodium: 140 mEq/L (ref 135–145)
Total Protein: 6.5 g/dL (ref 6.0–8.3)

## 2010-09-12 NOTE — Letter (Signed)
May 05, 2007    [name] [name]   RE:  [name] [name], [name] [name]  MRN:  [id number]  /  DOB:  May 25, 1934   Dear [name]:   It is my pleasure to have treated you recently as a new patient in my  office.  I appreciate your confidence and the opportunity to participate  in your care.   Since I do have a busy inpatient endoscopy schedule and office schedule,  my office hours vary weekly.  I am, however, available for emergency  calls every day through my office.  If I cannot promptly meet an urgent  office appointment, another one of our gastroenterologists will be able  to assist you.   My well-trained staff are prepared to help you at all times.  For  emergencies after office hours, a physician from our gastroenterology  section is always available through my 24-hour answering service.   While you are under my care, I encourage discussion of your questions  and concerns, and I will be happy to return your calls as soon as I am  available.   Once again, I welcome you as a new patient and I look forward to a happy  and healthy relationship.    Sincerely,      Barbette Hair. Arlyce Dice, MD,FACG  Electronically Signed   RDK/MedQ  DD: 05/05/2007  DT: 05/05/2007  Job #: [id number]

## 2010-09-12 NOTE — Assessment & Plan Note (Signed)
[facility]                         GASTROENTEROLOGY OFFICE NOTE   NAME:Vecchio, EMILIJA BOHMAN                    MRN:          [id number]  DATE:07/09/2007                            DOB:          16-Feb-1935    PROBLEM:  Segmental colitis.   HISTORY OF PRESENT ILLNESS:  Ms. Druckenmiller has returned for scheduled  followup.  Since starting her Lialda, her bowel urgency symptoms have  significantly improved. Though she still has mild urgency, this is much  less than previously  she has two to three formed bowel movements every  morning. Thereafter, things tend to remain quiet.  She denies abdominal  pain or bleeding.  She has a segmental colitis associated with her  diverticular disease.   PHYSICAL EXAMINATION:  VITAL SIGNS:  Pulse 88.  Blood pressure 110/68.  Weight 142.   IMPRESSION:  Left sided colitis in association with severe diverticular  disease.  She is improved with Lialda therapy.  We had a lengthy  discussion regarding the possibility of fixed narrowing causing  obstructive symptoms, but she certainly is not at that point at this  time.   RECOMMENDATIONS:  Continue Lialda for one to two more months and then  try discontinuing.     Barbette Hair. Arlyce Dice, MD,FACG  Electronically Signed    RDK/MedQ  DD: 07/09/2007  DT: 07/10/2007  Job #: [id number]   cc:   Titus Dubin. Alwyn Ren, MD,FACP,FCCP

## 2010-09-12 NOTE — Letter (Signed)
May 05, 2007    Titus Dubin. Alwyn Ren, MD,FACP,FCCP  [address] [place], [place] [address]   RE:  BAILIE, CHRISTEN[place]Y  MRN:  [id number]  /  DOB:  11/09/1934   Dear Dr. Alwyn Ren:   Upon your kind referral, I had the pleasure of evaluating your patient  and I am pleased to offer my findings.  I saw [name] [name] in the  office today.  Enclosed is a copy of my progress note that details my  findings and recommendations.   Thank you for the opportunity to participate in your patient's care.    Sincerely,      Barbette Hair. Arlyce Dice, MD,FACG  Electronically Signed    RDK/MedQ  DD: 05/05/2007  DT: 05/05/2007  Job #: [id number]

## 2010-09-12 NOTE — Assessment & Plan Note (Signed)
[facility]ALTHCARE                         GASTROENTEROLOGY OFFICE NOTE   NAME:[name], [name] ANDY                    MRN:          [id number]  DATE:05/05/2007                            DOB:          Oct 18, 1934    REASON FOR CONSULTATION:  Frequent bowel movements.   HISTORY OF PRESENT ILLNESS:  [name] [name] is a pleasant 76 year old  white female referred through the courtesy of Dr. Alwyn Ren for evaluation.  She has a history of diverticulitis. Family history is pertinent for  mother who had colon cancer.  She has undergone periodic colonoscopy.  Last exam apparently was 2004.  Over the last few years she has had very  erratic bowels with frequent episodes of multiple, soft, or loose stools  daily.  This is accompanied by urgency.  She will occasionally waken  with the urge to defecate.  There is no history of melena or  hematochezia.  She recently had an episode of severe diarrhea in  November of 2008 for which she was treated with Cipro and Flagyl with  resolution back to her baseline.  The tendency for soft stools and  diarrhea are coincident with therapy with Gleevec for her chronic  leukemia.  She has a history of diverticulitis.   PAST MEDICAL HISTORY:  Pertinent for CML and breast cancer.  The patient  informed me that she anticipates changing her medication because of  recurrence.  She has hypertension.  She is status post hysterectomy and  partial mastectomy.   FAMILY HISTORY:  Noncontributory.   MEDICATIONS:  Medications include Gleevec, aspirin and GLUCOSAMINE.   ALLERGIES:  PENICILLIN (hives).   She does not smoke.  She drinks occasionally.  She is married and  retired.   REVIEW OF SYSTEMS:  Positive for joint pain.   PHYSICAL EXAMINATION:  Pulse: 76.  Blood pressure: 110/66.  Weight: 145.  HEENT: EOMI.  PERRLA.  Sclerae are anicteric.  Conjunctivae are pink.  NECK:  Supple without thyromegaly, adenopathy or carotid bruits.  CHEST:   Clear to auscultation and percussion without adventitious  sounds.  CARDIAC:  Regular rhythm; normal S1 S2.  There are no murmurs, gallops  or rubs.  ABDOMEN:  Bowel sounds are normoactive.  Abdomen is soft, nontender and  nondistended.  There are no abdominal masses, tenderness, splenic  enlargement or hepatomegaly.  EXTREMITIES:  Full range of motion.  No cyanosis, clubbing or edema.  RECTAL:  Deferred.   IMPRESSION:  1. Paretic bowels.  Symptoms certainly may be related to irritable      bowel syndrome.  Microscopic colitis is also a consideration.      Finally, symptoms are con incident with therapy with Gleevec, which      could be contributing, if not causing, her bowel complaints.  2. Family history of colorectal carcinoma.  3. Chronic myelocytic leukemia.  4. History of diverticulitis.  5. Questionable recent bacterial gastroenteritis.   RECOMMENDATIONS:  1. Continue fiber supplementation.  2. Fluoro-Q one daily for two weeks.  3. Colonoscopy for colorectal cancer screening.  I plan on taking      random biopsies to rule out microscopic colitis.  4. Reevaluate [name] [name] after her Gleevec has been discontinued and      pending results of the above.     Barbette Hair. Arlyce Dice, MD,FACG  Electronically Signed    RDK/MedQ  DD: 05/05/2007  DT: 05/05/2007  Job #: [id number]   cc:   Drue Second, MD  Titus Dubin. Alwyn Ren, MD,FACP,FCCP

## 2010-09-12 NOTE — Assessment & Plan Note (Signed)
[facility]                         GASTROENTEROLOGY OFFICE NOTE   NAME:[name], [name] HOLSAPPLE                    MRN:          [id number]  DATE:05/20/2007                            DOB:          08-15-1934    PROBLEM:  Bowel urgency.   [name] [name] has returned for re-evaluation.  Colonoscopy on May 13, 2007 demonstrated a segment of left colon that was abnormal,  characterized by marked edema with luminal narrowing.  This is an area  of severe diverticular changes.  Following the procedure she was  complaining of abdominal pain for several days.  She continues to have  urgency and passage of mucus.  Biopsies demonstrated focal edema and  rare dilated crypts but no chronic inflammation.  [name] [name] continues  to complain of urgency and passage of mucus.   EXAMINATION:  Pulse 100, blood pressure 108/58, weight 141.   IMPRESSION:  Segmental colitis associated with diverticular disease.  I  believe this is responsible for her symptoms.   RECOMMENDATIONS:  Trial of Lialda 2.4 grams a day.  If this is not  successful then I will try steroid enemas.     Barbette Hair. Arlyce Dice, MD,FACG  Electronically Signed    RDK/MedQ  DD: 05/20/2007  DT: 05/20/2007  Job #: [id number]   cc:   Titus Dubin. Alwyn Ren, MD,FACP,FCCP

## 2010-09-15 NOTE — Op Note (Signed)
NAMERONNITA, PAZ             ACCOUNT NO.:  [id number]   MEDICAL RECORD NO.:  [id number]          PATIENT TYPE:  OUT   LOCATION:  OMED                         FACILITY:  [facility]   PHYSICIAN:  Drue Second, MD       DATE OF BIRTH:  1934-07-22   DATE OF PROCEDURE:  07/21/2004  DATE OF DISCHARGE:                                 OPERATIVE REPORT   PROCEDURE:  Bone marrow biopsy and aspirate.   REASON:  Follow up for CML.   TECHNIQUE:  After obtaining a written informed consent, the patient had an  IV placed and placed in the prone position.  With constant monitoring and  conscious sedation, patient received 5 mg of IV Versed and 25 mg of Demerol.  The left posterior iliac crest was prepped and draped in the usual manner.  Lidocaine 2% was used for local anesthetic.  With an [place] aspirate  needle, an aspirate was obtained.  And with Jamshidi core biopsy needle, a  core biopsy was obtained.  Postprocedure, the patient had pressure dressing  applied to the left posterior iliac crest.   Specimens were sent for flow cytogenetics and routine stains.  Cytogenetics  were sent with [facility], looking for [facility] chromosome/bcl/abl.   The patient tolerated the procedure and was observed for an hour until she  was awake and alert and was discharged with husband, who is the driver.  The  patient has a follow up set up for a CBC in 1 month's time.  And the follow  up is in June or sooner if need arises.      KK/MEDQ  D:  07/21/2004  T:  07/21/2004  Job:  [id number]

## 2010-09-15 NOTE — Discharge Summary (Signed)
NAMECHANDLER, STOFER             ACCOUNT NO.:  [id number]   MEDICAL RECORD NO.:  [id number]          PATIENT TYPE:  INP   LOCATION:  4733                         FACILITY:  [facility]   PHYSICIAN:  Bruce Rexene Edison. Swords, MD    DATE OF BIRTH:  Apr 17, 1935   DATE OF ADMISSION:  06/27/2006  DATE OF DISCHARGE:  06/28/2006                         DISCHARGE SUMMARY - REFERRING   DISCHARGE DIAGNOSES:  1. Right lower extremity pain, unknown cause.  2. Chronic myelogenous leukemia.  3. History of breast cancer.  4. Elevated D-dimer.   DISCHARGE MEDICATION:  Gleevec at current home dose.   CONDITION ON DISCHARGE:  Improved.   FOLLOW UP:  Dr. Alwyn Ren 2-3 weeks.   HOSPITAL COURSE:  Patient admitted to the Hospitalists service on  June 27, 2006, from Dr. Frederik Pear.  Patient with concern of DVT.  D-  dimer was also elevated.  Patient underwent ultrasound to rule out DVT.  Preliminary report is that there is no deep venous thrombosis in the  right lower extremity.  Pain and swelling has resolved.  She was  initially treated with Lovenox.  There is no evidence of DVT, Lovenox  will be discontinued and she can go home.  Elevated D-dimer, unknown  cause, perhaps related to underlying hematologic malignancy.  I do not  think any further evaluation is necessary at this time.  Hypercoagulation screen was obtained.  All was negative.      Bruce Rexene Edison Swords, MD  Electronically Signed     BHS/MEDQ  D:  06/28/2006  T:  06/28/2006  Job:  [id number]

## 2010-09-15 NOTE — Letter (Signed)
July 18, 2006    Drue Second, MD  [address] [place], [place] [address]   RE:  [name] [name], [name] [name]  MRN:  [id number]  /  DOB:  Jun 08, 1934   Dear Dr. Welton Flakes:   I saw [name] [name] on March 17, concerned that she may be having blood in her  urine and her stool.  She also had some nausea and vomiting on March 16.  She questioned whether it might be related to fresh roasted beets that  she had eaten for dinner March 15 at a friend's home.  By history she  has had a colonoscopy in 2004 which showed diverticulosis.  She had a  past history of urinary tract infections with dysuria and pain but no  hematuria.   Her weight was stable.  She had no organomegaly, masses or  lymphadenopathy.  There was no tenderness at the flank or over the  abdomen.  Urinalysis was negative for blood.  Culture and sensitivity  revealed no significant infection.   Stool cards were  negative & she denied any GI symptoms when she  returned March 20.  I had asked for her to return because her hematocrit  was 33.1 @ the inital visit March 17.  Since you have been following  [name] [name] for her CML I have not been performing CBCs.  My most recent  hematocrit had been in May of 2005; at that time her hematocrit was  40.5.   The MCV was elevated at 112; MCHC was normal as was the RDW.  The  indices do not suggest an iron deficient anemia.   Again her exam was unremarkable.   Review of lab data you have kindly shared with me suggest that her  anemia is stable.  I defer evaluating the elevated MCV to you.   In talking to [name] [name] and her husband [name] [name] they are most effusive in  singing your praises.  They are greatly impressed with your intellect,  also the compassionate care you render.  I thank you for that; I have  known this nice couple for approximately 3 decades.    Sincerely,      Titus Dubin. Alwyn Ren, MD,FACP,FCCP  Electronically Signed    [facility]/MedQ  DD: 07/18/2006  DT: 07/18/2006  Job #: [id number]

## 2010-09-15 NOTE — H&P (Signed)
NAMEMELANEE, CORDIAL             ACCOUNT NO.:  [id number]   MEDICAL RECORD NO.:  [id number]          PATIENT TYPE:  OBV   LOCATION:  4733                         FACILITY:  [facility]   PHYSICIAN:  Titus Dubin. Hopper, MD,FACP,FCCPDATE OF BIRTH:  04-22-1935   DATE OF ADMISSION:  06/27/2006  DATE OF DISCHARGE:  06/28/2006                              HISTORY & PHYSICAL   [name] [name] is a 76 year old white female who was admitted to the  hospital with a painful, swollen leg with clinical suggestion of deep  venous thrombosis.  This is in the context of a prior history of breast  cancer in 1993 as well as chronic myelogenous leukemia.   She began to experience acute pain on February 26 @ approximately 2 p.m.  She had been working in an [employer] and questioned  whether she might have strained the leg.  She had stopped painting about  2 hours prior to the onset of the pain but the pain progressed over the  next 5 hours to the point that it was excruciating, prompting an  emergency room visit.  She was given a pain pill which she cannot  identify but which did not provide relief and then a shot.  Subsequently  she was given a prescription for oxycodone 7.5/325 mg.  She has had only  partial response to this medication.   At this time she is very uncomfortable despite the narcotic pain  medication.   PAST MEDICAL HISTORY:  1. Hysterectomy for dysplasia.  2. She has had an appendectomy.  3. A right lumpectomy was performed in 1993 followed by radiation.      She was on tamoxifen for 5 years.  4. Colonoscopy has revealed diverticulosis.  5. She had arthroscopy on the right knee.  6. She is being followed by Dr. Drue Second for chronic myelogenous      leukemia.  7. Hypertension.  8. Dyslipidemia.  9. Rosacea.  10.A false positive ANA.   FAMILY HISTORY:  Negative for diabetes, hypertension, heart attack,  stroke.  Her mother had colon cancer.   She quit smoking in  1964 but only smoked 5-6 years.  She drinks  socially.   She is allergic to PENICILLIN.   She is presently on:  1. Calcium and vitamin D.  2. Multivitamins.  3. Aspirin 325 mg.  4. Glucosamine 1500 mg daily.  5. Gleevec 400 mg daily.  6. Oxycodone as needed.  7. She takes Levsin sublingually as needed for abdominal discomfort.   REVIEW OF SYSTEMS:  Completed in toto.  She describes occasional  tightening pain in the chest but this preceded the leg pain.  The  chest tightening was intermittent during the week prior to the events  noted above.   She had nausea and vomiting on one occasion, which she attributed to the  hydrocodone.  She has also had some dyspepsia.  She denies any rectal  bleeding or melena.  She has had no genitourinary symptoms.  She denies  any shortness of breath.  She has also had no paresthesias or weakness.  She has had no fever,  chills, sweats or change in weight.   PHYSICAL EXAMINATION:  VITAL SIGNS:  Weight was 153, which is actually  up 7 pounds.  Pulse was 68 and regular, respiratory 17, and blood  pressure of 136/70.  GENERAL:  On exam, she tends to pace and is obviously uncomfortable.  I  have known this lady for years and she is incredibly stoic.  HEENT:  Pupils are small and fundi are difficult to visualize.  There is  no icterus or jaundice.  Tympanic membranes are dull.  The remainder of  otolaryngologic exam is unremarkable.  NECK:  Thyroid is normal to palpation without nodules.  CHEST:  Clear without splinting, or rales or rubs.  CARDIAC:  She has a grade 1/2 to 1 systolic murmur.  She has no carotid  bruits.  There is no aortic aneurysm although the aorta is palpable.  Pedal pulses are intact.  There is no edema.  ABDOMEN:  She has no organomegaly or masses or lymphadenopathy.  SKIN:  Warm and dry.  EXTREMITIES:  She does limp due to the pain in the leg.  She has  fusiform changes of the right knee without definite effusion.  [name] [name]'  sign  is suggestively positive on the right.  NEUROLOGIC:  She is alert and oriented with no neuropsychiatric  deficits.   She is now at admitted to telemetry for possible deep venous thrombosis.   She will receive Lovenox coverage for deep venous thrombosis and a CT  or doppler of the legs performed. Initial designation will be  observation unless definitive diagnosis requires prolonged or extended  hospitalization.      Titus Dubin. Alwyn Ren, MD,FACP,FCCP  Electronically Signed     [facility]/MEDQ  D:  06/28/2006  T:  06/29/2006  Job:  [id number]   cc:   Drue Second, MD

## 2011-01-17 LAB — BONE MARROW EXAM

## 2011-01-17 LAB — MISCELLANEOUS TEST

## 2011-01-17 LAB — TISSUE HYBRIDIZATION (BONE MARROW)-NCBH

## 2011-03-29 ENCOUNTER — Other Ambulatory Visit: Payer: Medicare Other | Admitting: Lab

## 2011-03-29 ENCOUNTER — Ambulatory Visit: Payer: Medicare Other | Admitting: Oncology

## 2011-04-05 ENCOUNTER — Other Ambulatory Visit: Payer: Self-pay | Admitting: Oncology

## 2011-04-05 ENCOUNTER — Ambulatory Visit (HOSPITAL_BASED_OUTPATIENT_CLINIC_OR_DEPARTMENT_OTHER): Payer: Medicare Other

## 2011-04-05 DIAGNOSIS — C9210 Chronic myeloid leukemia, BCR/ABL-positive, not having achieved remission: Secondary | ICD-10-CM

## 2011-04-05 DIAGNOSIS — C50919 Malignant neoplasm of unspecified site of unspecified female breast: Secondary | ICD-10-CM

## 2011-04-05 LAB — CBC WITH DIFFERENTIAL/PLATELET
Eosinophils Absolute: 0.1 10*3/uL (ref 0.0–0.5)
MONO#: 0.4 10*3/uL (ref 0.1–0.9)
NEUT#: 1.6 10*3/uL (ref 1.5–6.5)
RBC: 2.84 10*6/uL — ABNORMAL LOW (ref 3.70–5.45)
RDW: 14.2 % (ref 11.2–14.5)
WBC: 3.2 10*3/uL — ABNORMAL LOW (ref 3.9–10.3)
lymph#: 1 10*3/uL (ref 0.9–3.3)

## 2011-04-05 LAB — COMPREHENSIVE METABOLIC PANEL
Albumin: 4 g/dL (ref 3.5–5.2)
Alkaline Phosphatase: 43 U/L (ref 39–117)
CO2: 29 mEq/L (ref 19–32)
Glucose, Bld: 90 mg/dL (ref 70–99)
Potassium: 4.6 mEq/L (ref 3.5–5.3)
Sodium: 139 mEq/L (ref 135–145)
Total Protein: 6.2 g/dL (ref 6.0–8.3)

## 2011-04-05 LAB — LACTATE DEHYDROGENASE: LDH: 227 U/L (ref 94–250)

## 2011-04-06 ENCOUNTER — Telehealth: Payer: Self-pay | Admitting: *Deleted

## 2011-04-06 ENCOUNTER — Ambulatory Visit (HOSPITAL_BASED_OUTPATIENT_CLINIC_OR_DEPARTMENT_OTHER): Payer: Medicare Other | Admitting: Oncology

## 2011-04-06 ENCOUNTER — Encounter: Payer: Self-pay | Admitting: Oncology

## 2011-04-06 VITALS — BP 131/78 | HR 86 | Temp 97.8°F | Ht 61.7 in | Wt 157.5 lb

## 2011-04-06 DIAGNOSIS — M1990 Unspecified osteoarthritis, unspecified site: Secondary | ICD-10-CM

## 2011-04-06 DIAGNOSIS — C9211 Chronic myeloid leukemia, BCR/ABL-positive, in remission: Secondary | ICD-10-CM

## 2011-04-06 NOTE — Telephone Encounter (Signed)
gave patient appointment for 08-2012 printed out calendar and gave to the patient

## 2011-04-06 NOTE — Progress Notes (Signed)
OFFICE PROGRESS NOTE   Marga Melnick, MD, MD [phone] [facility] [address] [place] [address]  DIAGNOSIS: 77 year old female with CML originally diagnosed in 2004  PRIOR THERAPY: Patient has been on Gleevec 300 mg daily since diagnosis  CURRENT THERAPY: Gleevec 300 mg daily  INTERVAL HISTORY: [name] [name] 76 y.o. female returns for followup visit today overall she seems to be doing well they just returned from [place] where they have their summer home. She feels well has done well has had a wonderful Thanksgiving. She is denying any fevers chills night sweats headaches shortness of breath chest pains palpitations no myalgias or arthralgias. She has no hematuria hematochezia. Patient does occasionally have some diarrhea but she is using Imodium. Her diarrhea may be secondary to the Gleevec. Remainder of the 10 point review of systems is negative.  MEDICAL HISTORY: Past Medical History  Diagnosis Date  . Breast cancer   . Leukemia   . CML (chronic myeloid leukemia)   . Arthritis     ALLERGIES:  is allergic to penicillins.  MEDICATIONS:  Current Outpatient Prescriptions  Medication Sig Dispense Refill  . aspirin 81 MG tablet Take 81 mg by mouth daily.          SURGICAL HISTORY:  Past Surgical History  Procedure Date  . Cholecystectomy   . Knee surgery   . Breast surgery     REVIEW OF SYSTEMS:  Pertinent items are noted in HPI.   PHYSICAL EXAMINATION: General appearance: alert, cooperative and appears stated age Neck: no adenopathy, no carotid bruit, no JVD, supple, symmetrical, trachea midline and thyroid not enlarged, symmetric, no tenderness/mass/nodules Lymph nodes: Cervical, supraclavicular, and axillary nodes normal. Resp: clear to auscultation bilaterally and normal percussion bilaterally Back: symmetric, no curvature. ROM normal. No CVA tenderness. Cardio: regular rate and rhythm, S1, S2 normal, no murmur, click, rub or gallop GI: soft,  non-tender; bowel sounds normal; no masses,  no organomegaly Extremities: extremities normal, atraumatic, no cyanosis or edema Neurologic: Alert and oriented X 3, normal strength and tone. Normal symmetric reflexes. Normal coordination and gait  ECOG PERFORMANCE STATUS: 0 - Asymptomatic  Blood pressure 131/78, pulse 86, temperature 97.8 F (36.6 C), height 5' 1.7" (1.567 m), weight 157 lb 8 oz (71.442 kg).  LABORATORY DATA: Lab Results  Component Value Date   WBC 3.2* 04/05/2011   HGB 11.0* 04/05/2011   HCT 32.5* 04/05/2011   MCV 114.4* 04/05/2011   PLT 140* 04/05/2011      Chemistry      Component Value Date/Time   NA 139 04/05/2011 0953   NA 135 09/13/2009 0924   K 4.6 04/05/2011 0953   K 4.3 09/13/2009 0924   CL 102 04/05/2011 0953   CL 98 09/13/2009 0924   CO2 29 04/05/2011 0953   CO2 30 09/13/2009 0924   BUN 20 04/05/2011 0953   BUN 23* 09/13/2009 0924   CREATININE 1.02 04/05/2011 0953   CREATININE 1.0 09/13/2009 0924      Component Value Date/Time   CALCIUM 9.0 04/05/2011 0953   CALCIUM 9.3 09/13/2009 0924   ALKPHOS 43 04/05/2011 0953   ALKPHOS 72 09/13/2009 0924   AST 33 04/05/2011 0953   AST 32 09/13/2009 0924   ALT 17 04/05/2011 0953   BILITOT 0.7 04/05/2011 0953   BILITOT 0.80 09/13/2009 0924       RADIOGRAPHIC STUDIES:  No results found.  ASSESSMENT: 76 year old female with CML in remission currently on Gleevec. Overall she's doing well she  is tolerating the Gleevec quite well. We will send her blood for peripheral for BCR?ABL. She also has had a CBC her counts low pretty stable.   PLAN: See patient back in about 6 months time which will be in May 2013. At that time she will have a CBC C. matte and BCR/ABL analysis.   All questions were answered. The patient knows to call the clinic with any problems, questions or concerns. We can certainly see the patient much sooner if necessary.  I spent 25 minutes counseling the patient face to face. The total time spent in the  appointment was 30 minutes.    Drue Second, MD Medical/Oncology [facility] [phone] (beeper) [phone] (Office)  04/06/2011, 3:58 PM

## 2011-04-13 LAB — BCR/ABL

## 2011-05-01 HISTORY — PX: LEG TENDON SURGERY: SHX1004

## 2011-05-01 HISTORY — PX: TRIGGER FINGER RELEASE: SHX641

## 2011-05-08 ENCOUNTER — Telehealth: Payer: Self-pay | Admitting: *Deleted

## 2011-05-08 NOTE — Telephone Encounter (Signed)
Per MD notified pt she remains in remission

## 2011-05-10 ENCOUNTER — Telehealth: Payer: Self-pay | Admitting: *Deleted

## 2011-05-10 NOTE — Telephone Encounter (Signed)
Called pt , notified per MD, Blood work taken on 04/05/11 BCR-ABL PCR results -pt's CML remains in remission.

## 2011-05-14 ENCOUNTER — Other Ambulatory Visit: Payer: Self-pay | Admitting: Oncology

## 2011-05-15 ENCOUNTER — Encounter: Payer: Self-pay | Admitting: Internal Medicine

## 2011-06-14 ENCOUNTER — Other Ambulatory Visit: Payer: Self-pay | Admitting: Oncology

## 2011-07-09 ENCOUNTER — Encounter: Payer: Self-pay | Admitting: Internal Medicine

## 2011-07-09 ENCOUNTER — Ambulatory Visit (INDEPENDENT_AMBULATORY_CARE_PROVIDER_SITE_OTHER): Payer: Medicare Other | Admitting: Internal Medicine

## 2011-07-09 DIAGNOSIS — Z0000 Encounter for general adult medical examination without abnormal findings: Secondary | ICD-10-CM

## 2011-07-09 DIAGNOSIS — N951 Menopausal and female climacteric states: Secondary | ICD-10-CM

## 2011-07-09 DIAGNOSIS — E785 Hyperlipidemia, unspecified: Secondary | ICD-10-CM

## 2011-07-09 NOTE — Patient Instructions (Signed)
Preventive Health Care: Exercise  30-45  minutes a day, 3-4 days a week. Walking is especially valuable in preventing Osteoporosis. Eat a low-fat diet with lots of fruits and vegetables, up to 7-9 servings per day.Consume less than 30 grams of sugar per day from foods & drinks with High Fructose Corn Syrup as # 1,2,3 or #4 on label.  

## 2011-07-09 NOTE — Progress Notes (Signed)
Subjective:    Patient ID: [name] [name], female    DOB: 10-14-1934, 77 y.o.   MRN: [id number]  HPI Medicare Wellness Visit:  The following psychosocial & medical history were reviewed as required by Medicare.   Social history: caffeine: 3 cups of coffee/ day , alcohol: 14 glasses / week ,  tobacco use : quit 1964  & exercise : walking 30 min 6 X /week    Home & personal  safety / fall risk: no issues, activities of daily living:no limitations , seatbelt use : yes , and smoke alarm employment : yes .  Power of Attorney/Living Will status : in place  Vision ( as recorded per Nurse) & Hearing  evaluation :  See exam. Orientation :orientation X 3 , memory & recall :good ,  math testing: good ,and mood & affect :normal . Depression / anxiety: denied Travel history : 2009 [place] , immunization status :no shingles or Flu as per Dr Welton Flakes , transfusion history:  ? no, and preventive health surveillance ( colonoscopies, BMD , etc as per protocol/ [facility]): colonoscopy due 2014, Dental care:  Every 6 mos . Chart reviewed &  Updated. Active issues reviewed & addressed.       Review of Systems Patient reports no significant or persistent  vision/ hearing  changes, adenopathy,fever, weight change,  persistant / recurrent hoarseness , swallowing issues, chest pain,palpitations,edema,persistant /recurrent cough, hemoptysis, dyspnea( rest/ exertional/paroxysmal nocturnal), gastrointestinal bleeding(melena, rectal bleeding), abdominal pain, significant heartburn,  bowel changes,GU symptoms(dysuria, hematuria,pyuria, incontinence), Gyn symptoms(abnormal  bleeding , pain),  syncope, focal weakness, memory loss,numbness & tingling, skin/hair /nail changes,abnormal bruising or bleeding.  She has foot surgery scheduled this week for left foot pain which has decreased her walking in the recent past.  Lab studies completed 04/05/11 were reviewed. Her leukopenia and anemia were stable. Platelet count was minimally  reduced.  Her last bone density was 3 years ago; it was normal     Objective:   Physical Exam Gen.: Healthy and well-nourished in appearance. Alert, appropriate and cooperative throughout exam. Head: Normocephalic without obvious abnormalities  Eyes: No corneal or conjunctival inflammation noted. Pupils equal round reactive to light and accommodation.  Extraocular motion intact. Vision grossly normal; lens worn on left. Ears: External  ear exam reveals no significant lesions or deformities. Canals clear .TMs normal. Hearing is grossly normal bilaterally. Nose: External nasal exam reveals no deformity or inflammation. Nasal mucosa are pink and moist. No lesions or exudates noted.   Mouth: Oral mucosa and oropharynx reveal no lesions or exudates. Teeth in good repair. Neck: No deformities, masses, or tenderness noted. Range of motion &Thyroid normal . Lungs: Normal respiratory effort; chest expands symmetrically. Lungs are clear to auscultation without rales, wheezes, or increased work of breathing. Heart: Normal rate and rhythm. Normal S1 and S2. No gallop, click, or rub. S4 w/o murmur. Abdomen: Bowel sounds normal; abdomen soft and nontender. No masses, organomegaly or hernias noted. Aorta palpable ; no AAA  Genitalia: S/P TAH & BSO   .  Musculoskeletal/extremities: Minor lordosis noted of  the thoracic  spine. No clubbing, cyanosis, edema, or deformity noted. Range of motion  normal .Tone & strength  Normal.Joints;mild OA changes. Nail health  good. Vascular: Carotid, radial artery, dorsalis pedis and  posterior tibial pulses are full and equal. No bruits present. Neurologic: Alert and oriented x3. Deep tendon reflexes symmetrical and normal except 1/2 + R knee          Skin: Intact without suspicious lesions or rashes. Lymph: No cervical, axillary lymphadenopathy present. Psych: Mood and affect are normal.  Normally interactive                                                                                        Assessment & Plan:  #1 Medicare Wellness Exam; criteria met ; data entered #2 Problem List reviewed ; Assessment/ Recommendations made Plan: see Orders

## 2011-08-06 ENCOUNTER — Other Ambulatory Visit: Payer: Self-pay | Admitting: Oncology

## 2011-08-22 ENCOUNTER — Other Ambulatory Visit: Payer: Self-pay | Admitting: Oncology

## 2011-08-22 ENCOUNTER — Telehealth: Payer: Self-pay | Admitting: Gastroenterology

## 2011-08-22 NOTE — Telephone Encounter (Signed)
Pt states she is taking Gleevac for her Leukemia and it is working well. She states that a side effect she is having from the Gleevac is diarrhea. Pt wants to know if she can take 1/2 an Imodium daily for an extended period of time? States it seems to help. Please advise.

## 2011-08-22 NOTE — Telephone Encounter (Signed)
Left message for pt to call back  °

## 2011-08-22 NOTE — Telephone Encounter (Signed)
1-2 Imodium every 6 hours as needed

## 2011-08-27 NOTE — Telephone Encounter (Signed)
Left message for pt to call back  °

## 2011-08-28 NOTE — Telephone Encounter (Signed)
Left message for pt to call back.  Pt had an appt with Dr. Arlyce Dice today. She did not return the call.

## 2011-08-29 ENCOUNTER — Encounter: Payer: Self-pay | Admitting: Gastroenterology

## 2011-08-29 ENCOUNTER — Ambulatory Visit (INDEPENDENT_AMBULATORY_CARE_PROVIDER_SITE_OTHER): Payer: Medicare Other | Admitting: Gastroenterology

## 2011-08-29 VITALS — BP 122/60 | HR 80 | Ht 62.0 in | Wt 154.8 lb

## 2011-08-29 DIAGNOSIS — R197 Diarrhea, unspecified: Secondary | ICD-10-CM | POA: Insufficient documentation

## 2011-08-29 DIAGNOSIS — K5010 Crohn's disease of large intestine without complications: Secondary | ICD-10-CM

## 2011-08-29 DIAGNOSIS — Z800 Family history of malignant neoplasm of digestive organs: Secondary | ICD-10-CM

## 2011-08-29 NOTE — Progress Notes (Signed)
History of Present Illness: [name] [name] is a 77 year old white female with CML her for evaluation of diarrhea. 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 She has CML and takes Gleevac.  Since that time she's had intermittent diarrhea. Several times a week she may have a solid stool followed by loose stool. This is accompanied by urgency and occasional incontinence. There is no history of melena or hematochezia. History is pertinent for mother with colon cancer in her 64s. Last colonoscopy was 2010 which demonstrated severe diverticular disease.    Past Medical History  Diagnosis Date  . Breast cancer     S/P lumpectomy , radiation, & Tamoxifen  . CML (chronic myeloid leukemia)     Dr Park Breed  . Arthritis     DJD   Past Surgical History  Procedure Date  . Knee surgery     Arthroscopy, right knee   . Breast surgery     Right lumpectomy, s/p radiation 1993 and oral  Tamoxifen x 5 years ([id number])   . Appendectomy   . Abdominal hysterectomy     no BSO, for Dysplasia   . Colonoscopy 03/2003    diverticulosis, Dr.Espen Bethel  . Sigmoid colon resection 2010     Dr Edwyna Shell, [place]  . Ileostomy reversal     3 mos after above  . Bilateral salpingoophorectomy 2010    with bowel resection  . Gastrocslide     left leg   family history includes Breast cancer in her paternal aunt; Cancer in her father; Colon cancer (age of onset:75) in her mother; Diabetes in her cousin; Heart attack (age of onset:65) in her paternal uncle; and Uterine cancer in her sister. Current Outpatient Prescriptions  Medication Sig Dispense Refill  . aspirin 81 MG tablet Take 81 mg by mouth daily.        . [place]-Glucosamine-Vit D (GLUCOSAMINE COMPLEX PO) Take by mouth 2 (two) times daily.      Marland Kitchen CALCIUM-VITAMIN D PO Take by mouth 2 (two) times daily.      . folic acid (FOLVITE) 1 MG tablet TAKE 1 TABLET BY MOUTH EVERY DAY  90 tablet  0  . GLEEVEC 100 MG tablet  TAKE 3 TO 4 TABLETS BY MOUTH DAILY OR AS DIRECTED BY PHYSICIAN  120 tablet  0  . Multiple Vitamin (MULTIVITAMIN) tablet Take 1 tablet by mouth daily.      . Probiotic Product (ALIGN) 4 MG CAPS Take by mouth daily.       Allergies as of 08/29/2011 - Review Complete 08/29/2011  Allergen Reaction Noted  . Penicillins      reports that she quit smoking about 49 years ago. She has never used smokeless tobacco. She reports that she drinks about 8.4 ounces of alcohol per week. She reports that she does not use illicit drugs.     Review of Systems: Pertinent positive and negative review of systems were noted in the above HPI section. All other review of systems were otherwise negative.  Vital signs were reviewed in today's medical record Physical Exam: General: Well developed , well nourished, no acute distress Head: Normocephalic and atraumatic Eyes:  sclerae anicteric, EOMI Ears: Normal auditory acuity Mouth: No deformity or lesions Neck: Supple, no masses or thyromegaly Lungs: Clear throughout to auscultation Heart: Regular rate and rhythm; no murmurs, rubs or bruits Abdomen: Soft, non tender and non distended. No masses, hepatosplenomegaly or hernias noted. Normal Bowel sounds Rectal:deferred Musculoskeletal: Symmetrical with no  gross deformities  Skin: No lesions on visible extremities Pulses:  Normal pulses noted Extremities: No clubbing, cyanosis, edema or deformities noted Neurological: Alert oriented x 4, grossly nonfocal Cervical Nodes:  No significant cervical adenopathy Inguinal Nodes: No significant inguinal adenopathy Psychological:  Alert and cooperative. Normal mood and affect

## 2011-08-29 NOTE — Assessment & Plan Note (Signed)
Diarrhea is nonspecific.  Recommendations #1 Imodium when necessary #2 fiber supplementation

## 2011-08-29 NOTE — Patient Instructions (Signed)
Follow up as needed

## 2011-08-29 NOTE — Assessment & Plan Note (Signed)
Plan followup colonoscopy 2015 

## 2011-09-10 ENCOUNTER — Ambulatory Visit (HOSPITAL_BASED_OUTPATIENT_CLINIC_OR_DEPARTMENT_OTHER): Payer: Medicare Other | Admitting: Oncology

## 2011-09-10 ENCOUNTER — Encounter: Payer: Self-pay | Admitting: Oncology

## 2011-09-10 ENCOUNTER — Ambulatory Visit: Payer: Medicare Other | Admitting: Oncology

## 2011-09-10 ENCOUNTER — Other Ambulatory Visit: Payer: Self-pay | Admitting: Medical Oncology

## 2011-09-10 ENCOUNTER — Other Ambulatory Visit (HOSPITAL_BASED_OUTPATIENT_CLINIC_OR_DEPARTMENT_OTHER): Payer: Medicare Other | Admitting: Lab

## 2011-09-10 ENCOUNTER — Telehealth: Payer: Self-pay | Admitting: *Deleted

## 2011-09-10 VITALS — BP 127/81 | HR 88 | Temp 98.2°F | Ht 62.0 in | Wt 154.4 lb

## 2011-09-10 DIAGNOSIS — C9211 Chronic myeloid leukemia, BCR/ABL-positive, in remission: Secondary | ICD-10-CM

## 2011-09-10 DIAGNOSIS — M1990 Unspecified osteoarthritis, unspecified site: Secondary | ICD-10-CM

## 2011-09-10 DIAGNOSIS — C9210 Chronic myeloid leukemia, BCR/ABL-positive, not having achieved remission: Secondary | ICD-10-CM

## 2011-09-10 LAB — CBC WITH DIFFERENTIAL/PLATELET
Basophils Absolute: 0 10*3/uL (ref 0.0–0.1)
EOS%: 1.6 % (ref 0.0–7.0)
HGB: 11.3 g/dL — ABNORMAL LOW (ref 11.6–15.9)
LYMPH%: 27.8 % (ref 14.0–49.7)
MCH: 39.1 pg — ABNORMAL HIGH (ref 25.1–34.0)
MCV: 115.5 fL — ABNORMAL HIGH (ref 79.5–101.0)
MONO%: 11.4 % (ref 0.0–14.0)
RDW: 13.1 % (ref 11.2–14.5)

## 2011-09-10 LAB — COMPREHENSIVE METABOLIC PANEL
Alkaline Phosphatase: 58 U/L (ref 39–117)
BUN: 21 mg/dL (ref 6–23)
Creatinine, Ser: 0.99 mg/dL (ref 0.50–1.10)
Glucose, Bld: 99 mg/dL (ref 70–99)
Sodium: 140 mEq/L (ref 135–145)
Total Bilirubin: 0.7 mg/dL (ref 0.3–1.2)

## 2011-09-10 MED ORDER — IMATINIB MESYLATE 100 MG PO TABS: 100.0000 mg | ORAL_TABLET | Freq: Every day | ORAL | Status: DC

## 2011-09-10 MED ORDER — FOLIC ACID 1 MG PO TABS: 1.0000 mg | ORAL_TABLET | Freq: Every day | ORAL | Status: DC

## 2011-09-10 NOTE — Progress Notes (Signed)
OFFICE PROGRESS NOTE   Marga Melnick, MD, MD [phone] [facility] [address] [place] [address]  DIAGNOSIS: 77 year old female with CML originally diagnosed in 2004  PRIOR THERAPY: Patient has been on Gleevec 300 mg daily since diagnosis  CURRENT THERAPY: Gleevec 300 mg daily  INTERVAL HISTORY: [name] [name] 77 y.o. female returns for followup visit today overall she seems to be doing well. They leaving for [place] where they have their summer home.She is denying any fevers chills night sweats headaches shortness of breath chest pains palpitations no myalgias or arthralgias. She has no hematuria hematochezia. Patient does occasionally have some diarrhea but she is using Imodium. Her diarrhea may be secondary to the Gleevec. Remainder of the 10 point review of systems is negative.  MEDICAL HISTORY: Past Medical History  Diagnosis Date  . Breast cancer     S/P lumpectomy , radiation, & Tamoxifen  . CML (chronic myeloid leukemia)     Dr Park Breed  . Arthritis     DJD    ALLERGIES:  is allergic to penicillins.  MEDICATIONS:  Current Outpatient Prescriptions  Medication Sig Dispense Refill  . aspirin 81 MG tablet Take 81 mg by mouth daily.        . Boswellia-Glucosamine-Vit D (GLUCOSAMINE COMPLEX PO) Take by mouth 2 (two) times daily.      Marland Kitchen CALCIUM-VITAMIN D PO Take by mouth 2 (two) times daily.      Marland Kitchen loperamide (IMODIUM A-D) 2 MG tablet Take 1 mg by mouth daily.      . Multiple Vitamin (MULTIVITAMIN) tablet Take 1 tablet by mouth daily.      . Probiotic Product (ALIGN) 4 MG CAPS Take by mouth daily.      . folic acid (FOLVITE) 1 MG tablet Take 1 tablet (1 mg total) by mouth daily.  90 tablet  12  . imatinib (GLEEVEC) 100 MG tablet Take 1 tablet (100 mg total) by mouth daily. Take with meals and large glass of water.Caution:Chemotherapy  120 tablet  12    SURGICAL HISTORY:  Past Surgical History  Procedure Date  . Knee surgery     Arthroscopy, right knee   .  Breast surgery     Right lumpectomy, s/p radiation 1993 and oral  Tamoxifen x 5 years ([id number])   . Appendectomy   . Abdominal hysterectomy     no BSO, for Dysplasia   . Colonoscopy 03/2003    diverticulosis, Dr.Kaplan  . Sigmoid colon resection 2010     Dr Edwyna Shell, [place]  . Ileostomy reversal     3 mos after above  . Bilateral salpingoophorectomy 2010    with bowel resection  . Gastrocslide     left leg    REVIEW OF SYSTEMS:  Pertinent items are noted in HPI.   PHYSICAL EXAMINATION: General appearance: alert, cooperative and appears stated age Neck: no adenopathy, no carotid bruit, no JVD, supple, symmetrical, trachea midline and thyroid not enlarged, symmetric, no tenderness/mass/nodules Lymph nodes: Cervical, supraclavicular, and axillary nodes normal. Resp: clear to auscultation bilaterally and normal percussion bilaterally Back: symmetric, no curvature. ROM normal. No CVA tenderness. Cardio: regular rate and rhythm, S1, S2 normal, no murmur, click, rub or gallop GI: soft, non-tender; bowel sounds normal; no masses,  no organomegaly Extremities: extremities normal, atraumatic, no cyanosis or edema Neurologic: Alert and oriented X 3, normal strength and tone. Normal symmetric reflexes. Normal coordination and gait  ECOG PERFORMANCE STATUS: 0 - Asymptomatic  Blood pressure 127/81, pulse 88,  temperature 98.2 F (36.8 C), temperature source Oral, height 5\' 2"  (1.575 m), weight 154 lb 6.4 oz (70.035 kg).  LABORATORY DATA: Lab Results  Component Value Date   WBC 3.2* 04/05/2011   HGB 11.0* 04/05/2011   HCT 32.5* 04/05/2011   MCV 114.4* 04/05/2011   PLT 140* 04/05/2011      Chemistry      Component Value Date/Time   NA 139 04/05/2011 0953   NA 135 09/13/2009 0924   K 4.6 04/05/2011 0953   K 4.3 09/13/2009 0924   CL 102 04/05/2011 0953   CL 98 09/13/2009 0924   CO2 29 04/05/2011 0953   CO2 30 09/13/2009 0924   BUN 20 04/05/2011 0953   BUN 23* 09/13/2009 0924   CREATININE  1.02 04/05/2011 0953   CREATININE 1.0 09/13/2009 0924      Component Value Date/Time   CALCIUM 9.0 04/05/2011 0953   CALCIUM 9.3 09/13/2009 0924   ALKPHOS 43 04/05/2011 0953   ALKPHOS 72 09/13/2009 0924   AST 33 04/05/2011 0953   AST 32 09/13/2009 0924   ALT 17 04/05/2011 0953   BILITOT 0.7 04/05/2011 0953   BILITOT 0.80 09/13/2009 0924       RADIOGRAPHIC STUDIES:  No results found.  ASSESSMENT: 77 year old female with CML in remission currently on Gleevec. Overall she's doing well she is tolerating the Gleevec quite well. We will send her blood for peripheral for BCR/ABL. She also has had a CBC her counts low pretty stable.   PLAN: See patient back in about 6 months time which will be in November 2013. At that time she will have a CBC CMET and BCR/ABL analysis. She is going to [place] and will return to [place] at the end of November, after Thanksgiving.   All questions were answered. The patient knows to call the clinic with any problems, questions or concerns. We can certainly see the patient much sooner if necessary.  I spent 25 minutes counseling the patient face to face. The total time spent in the appointment was 30 minutes.    Drue Second, MD Medical/Oncology [facility] [phone] (beeper) [phone] (Office)  09/10/2011, 12:48 PM

## 2011-09-10 NOTE — Telephone Encounter (Signed)
gave patient appointment for 02-2012 printed out calendar and gave to the patient 

## 2011-09-15 ENCOUNTER — Encounter: Payer: Self-pay | Admitting: Internal Medicine

## 2011-09-15 DIAGNOSIS — M8580 Other specified disorders of bone density and structure, unspecified site: Secondary | ICD-10-CM | POA: Insufficient documentation

## 2011-09-25 ENCOUNTER — Other Ambulatory Visit: Payer: Self-pay | Admitting: *Deleted

## 2011-09-25 NOTE — Telephone Encounter (Signed)
Pt currently in [place], s/w pharmacist Brayton Caves to clarify- Per MD pt to continue to take 300mg  Gleevac daily.  Brayton Caves advised Pt's rx will be filled as instructed by MD.

## 2011-09-26 ENCOUNTER — Telehealth: Payer: Self-pay | Admitting: *Deleted

## 2011-09-26 NOTE — Telephone Encounter (Signed)
Message copied by Cooper Render on Wed Sep 26, 2011  1:06 PM ------      Message from: [name] [name]      Created: Wed Sep 26, 2011  7:05 AM       Call patient: she is in remission from her CML

## 2011-09-26 NOTE — Telephone Encounter (Signed)
Per MD, attempted to notify pt she is in remission from her CML. Unable to reach pt. LMOVM requesting call back.

## 2011-10-02 ENCOUNTER — Encounter: Payer: Self-pay | Admitting: Internal Medicine

## 2011-10-18 ENCOUNTER — Telehealth: Payer: Self-pay | Admitting: *Deleted

## 2011-10-18 NOTE — Telephone Encounter (Signed)
Can we fax a prescription for CBC q month x 6 months to the [facility].

## 2011-10-18 NOTE — Telephone Encounter (Signed)
Rx for CBC q months x 6months faxed to [facility] [phone].

## 2011-10-18 NOTE — Telephone Encounter (Signed)
Pt called LMOVM  " Do I need to have my labs checked once a month?" Pt requested lab order be faxed to [facility] [phone] Pt currently in [place] for the summer.  Will review with MD F/U 03/24/12 - lab/MD

## 2011-10-23 ENCOUNTER — Telehealth: Payer: Self-pay | Admitting: *Deleted

## 2011-10-23 NOTE — Telephone Encounter (Signed)
[facility] in [place] called for diagnosis code for labs drawn at their labs.  205.10 given for CML.

## 2011-10-25 ENCOUNTER — Other Ambulatory Visit: Payer: Self-pay | Admitting: *Deleted

## 2011-10-25 MED ORDER — GLEEVEC 100 MG PO TABS: ORAL_TABLET | ORAL | Status: DC

## 2011-10-25 NOTE — Telephone Encounter (Signed)
Pt called concerned about refill for Gleevac.  Pt currently in [place]. Rx to be sent to [facility] in [place] not [facility] in [place].  Reviewed with Dr. Welton Flakes for Rx clarification.  Per MD, VO Gleevac-  100mg  tablets,  pt to take 3 tablets daily or as directed by MD. Quantity # 120,  Refills 12. Rx Sent to [facility] in [place], [place]

## 2011-11-23 ENCOUNTER — Telehealth: Payer: Self-pay | Admitting: Medical Oncology

## 2011-11-23 NOTE — Telephone Encounter (Signed)
LMOVM, per MD, patient doing well, keep same dosage of Gleevac (3 tablets daily).  Instructed patient to call with confirmation of message and with any questions.

## 2011-12-21 ENCOUNTER — Encounter: Payer: Self-pay | Admitting: Oncology

## 2012-03-11 ENCOUNTER — Telehealth: Payer: Self-pay | Admitting: *Deleted

## 2012-03-11 NOTE — Telephone Encounter (Signed)
Labs received via fax , reviewed by MD. Notified pt per MD to continue same dose of Gleevac she is currently taking.

## 2012-03-21 ENCOUNTER — Encounter: Payer: Self-pay | Admitting: Oncology

## 2012-03-24 ENCOUNTER — Ambulatory Visit: Payer: Medicare Other | Admitting: Oncology

## 2012-03-24 ENCOUNTER — Other Ambulatory Visit: Payer: Medicare Other | Admitting: Lab

## 2012-04-07 ENCOUNTER — Ambulatory Visit: Payer: Medicare Other | Admitting: Oncology

## 2012-04-10 ENCOUNTER — Encounter: Payer: Self-pay | Admitting: Oncology

## 2012-04-10 ENCOUNTER — Ambulatory Visit (HOSPITAL_BASED_OUTPATIENT_CLINIC_OR_DEPARTMENT_OTHER): Payer: Medicare Other | Admitting: Oncology

## 2012-04-10 ENCOUNTER — Telehealth: Payer: Self-pay | Admitting: Oncology

## 2012-04-10 ENCOUNTER — Other Ambulatory Visit (HOSPITAL_BASED_OUTPATIENT_CLINIC_OR_DEPARTMENT_OTHER): Payer: Medicare Other | Admitting: Lab

## 2012-04-10 VITALS — BP 142/77 | HR 80 | Temp 97.9°F | Resp 20 | Ht 62.0 in | Wt 157.5 lb

## 2012-04-10 DIAGNOSIS — C9211 Chronic myeloid leukemia, BCR/ABL-positive, in remission: Secondary | ICD-10-CM

## 2012-04-10 DIAGNOSIS — C9210 Chronic myeloid leukemia, BCR/ABL-positive, not having achieved remission: Secondary | ICD-10-CM

## 2012-04-10 LAB — COMPREHENSIVE METABOLIC PANEL (CC13)
ALT: 22 U/L (ref 0–55)
Albumin: 3.7 g/dL (ref 3.5–5.0)
BUN: 19 mg/dL (ref 7.0–26.0)
CO2: 29 mEq/L (ref 22–29)
Calcium: 9.4 mg/dL (ref 8.4–10.4)
Chloride: 104 mEq/L (ref 98–107)
Creatinine: 1.1 mg/dL (ref 0.6–1.1)
Potassium: 4.3 mEq/L (ref 3.5–5.1)

## 2012-04-10 LAB — CBC WITH DIFFERENTIAL/PLATELET
Eosinophils Absolute: 0 10*3/uL (ref 0.0–0.5)
HCT: 33.1 % — ABNORMAL LOW (ref 34.8–46.6)
HGB: 11.3 g/dL — ABNORMAL LOW (ref 11.6–15.9)
LYMPH%: 26.7 % (ref 14.0–49.7)
MONO#: 0.4 10*3/uL (ref 0.1–0.9)
NEUT#: 1.9 10*3/uL (ref 1.5–6.5)
NEUT%: 60 % (ref 38.4–76.8)
Platelets: 130 10*3/uL — ABNORMAL LOW (ref 145–400)
WBC: 3.2 10*3/uL — ABNORMAL LOW (ref 3.9–10.3)
lymph#: 0.9 10*3/uL (ref 0.9–3.3)

## 2012-04-10 NOTE — Telephone Encounter (Signed)
gve the pt her may 2014 appt calendar 

## 2012-04-10 NOTE — Progress Notes (Signed)
OFFICE PROGRESS NOTE   Marga Melnick, MD [phone] [address] [facility] [address] [place] [address]  DIAGNOSIS: 77 year old female with CML originally diagnosed in 2004  PRIOR THERAPY: Patient has been on Gleevec 300 mg daily since diagnosis  CURRENT THERAPY: Gleevec 300 mg daily  INTERVAL HISTORY: [name] [name] 77 y.o. female returns for followup visit today overall she is doing well feeling well. She's tolerating the Gleevec. Her white counts look stable. She does have chronic diarrhea she is on Imodium she is followed by Dr. Arlyce Dice for this. She otherwise denies any nausea vomiting fevers chills night sweats headaches no easy bruising no abdominal pain no distention. No peripheral paresthesias. No chest pains. Remainder of the 10 point review of systems is negative.  MEDICAL HISTORY: Past Medical History  Diagnosis Date  . Breast cancer     S/P lumpectomy , radiation, & Tamoxifen  . CML (chronic myeloid leukemia)     Dr Park Breed  . Arthritis     DJD    ALLERGIES:  is allergic to penicillins.  MEDICATIONS:  Current Outpatient Prescriptions  Medication Sig Dispense Refill  . aspirin 81 MG tablet Take 81 mg by mouth daily.        . Boswellia-Glucosamine-Vit D (GLUCOSAMINE COMPLEX PO) Take by mouth 2 (two) times daily.      Marland Kitchen CALCIUM-VITAMIN D PO Take by mouth 2 (two) times daily.      . folic acid (FOLVITE) 1 MG tablet Take 1 tablet (1 mg total) by mouth daily.  90 tablet  12  . GLEEVEC 100 MG tablet Take 3 tablets (100mg  each) daily for a  total of 300mg  daily or as directed by MD.  120 tablet  12  . loperamide (IMODIUM A-D) 2 MG tablet Take 1 mg by mouth daily.      . Multiple Vitamin (MULTIVITAMIN) tablet Take 1 tablet by mouth daily.      . Probiotic Product (ALIGN) 4 MG CAPS Take by mouth daily.        SURGICAL HISTORY:  Past Surgical History  Procedure Date  . Knee surgery     Arthroscopy, right knee   . Breast surgery     Right lumpectomy, s/p radiation  1993 and oral  Tamoxifen x 5 years ([id number])   . Appendectomy   . Abdominal hysterectomy     no BSO, for Dysplasia   . Colonoscopy 03/2003    diverticulosis, Dr.Kaplan  . Sigmoid colon resection 2010     Dr Edwyna Shell, [place]  . Ileostomy reversal     3 mos after above  . Bilateral salpingoophorectomy 2010    with bowel resection  . Gastrocslide     left leg    REVIEW OF SYSTEMS:  Pertinent items are noted in HPI.   PHYSICAL EXAMINATION: General appearance: alert, cooperative and appears stated age Neck: no adenopathy, no carotid bruit, no JVD, supple, symmetrical, trachea midline and thyroid not enlarged, symmetric, no tenderness/mass/nodules Lymph nodes: Cervical, supraclavicular, and axillary nodes normal. Resp: clear to auscultation bilaterally and normal percussion bilaterally Back: symmetric, no curvature. ROM normal. No CVA tenderness. Cardio: regular rate and rhythm, S1, S2 normal, no murmur, click, rub or gallop GI: soft, non-tender; bowel sounds normal; no masses,  no organomegaly Extremities: extremities normal, atraumatic, no cyanosis or edema Neurologic: Alert and oriented X 3, normal strength and tone. Normal symmetric reflexes. Normal coordination and gait  ECOG PERFORMANCE STATUS: 0 - Asymptomatic  Blood pressure 142/77, pulse 80, temperature 97.9  F (36.6 C), temperature source Oral, resp. rate 20, height 5\' 2"  (1.575 m), weight 157 lb 8 oz (71.442 kg).  LABORATORY DATA: Lab Results  Component Value Date   WBC 3.2* 04/10/2012   HGB 11.3* 04/10/2012   HCT 33.1* 04/10/2012   MCV 115.3* 04/10/2012   PLT 130* 04/10/2012      Chemistry      Component Value Date/Time   NA 141 04/10/2012 1150   NA 140 09/10/2011 1143   NA 135 09/13/2009 0924   K 4.3 04/10/2012 1150   K 4.5 09/10/2011 1143   K 4.3 09/13/2009 0924   CL 104 04/10/2012 1150   CL 102 09/10/2011 1143   CL 98 09/13/2009 0924   CO2 29 04/10/2012 1150   CO2 30 09/10/2011 1143   CO2 30 09/13/2009 0924    BUN 19.0 04/10/2012 1150   BUN 21 09/10/2011 1143   BUN 23* 09/13/2009 0924   CREATININE 1.1 04/10/2012 1150   CREATININE 0.99 09/10/2011 1143   CREATININE 1.0 09/13/2009 0924      Component Value Date/Time   CALCIUM 9.4 04/10/2012 1150   CALCIUM 9.4 09/10/2011 1143   CALCIUM 9.3 09/13/2009 0924   ALKPHOS 61 04/10/2012 1150   ALKPHOS 58 09/10/2011 1143   ALKPHOS 72 09/13/2009 0924   AST 34 04/10/2012 1150   AST 39* 09/10/2011 1143   AST 32 09/13/2009 0924   ALT 22 04/10/2012 1150   ALT 29 09/10/2011 1143   BILITOT 0.78 04/10/2012 1150   BILITOT 0.7 09/10/2011 1143   BILITOT 0.80 09/13/2009 0924       RADIOGRAPHIC STUDIES:  No results found.  ASSESSMENT: 77 year old female with CML in remission currently on Gleevec. Overall she's doing well she is tolerating the Gleevec quite well. We will send her blood for peripheral for BCR/ABL. She also has had a CBC her counts low pretty stable.   PLAN: See patient back in about 6 months time which will be in May 2014. At that time she will have a CBC CMET and BCR/ABL analysis. After which she will be gone back to [place]  All questions were answered. The patient knows to call the clinic with any problems, questions or concerns. We can certainly see the patient much sooner if necessary.  I spent 25 minutes counseling the patient face to face. The total time spent in the appointment was 30 minutes.    Drue Second, MD Medical/Oncology [facility] [phone] (beeper) [phone] (Office)  04/10/2012, 1:08 PM

## 2012-04-14 ENCOUNTER — Other Ambulatory Visit: Payer: Self-pay | Admitting: *Deleted

## 2012-04-14 ENCOUNTER — Telehealth: Payer: Self-pay | Admitting: *Deleted

## 2012-04-14 MED ORDER — GLEEVEC 100 MG PO TABS: ORAL_TABLET | ORAL | Status: DC

## 2012-04-14 NOTE — Telephone Encounter (Signed)
Pt called requesting Gleevac Rx refill for a 3 month supply as she receives a lower copay. Will review with MD

## 2012-04-14 NOTE — Telephone Encounter (Signed)
Called patient pateint confirmed over the phone the same date just different time on 09-04-2012 with dr.khan instead of dr.magrinat

## 2012-04-14 NOTE — Telephone Encounter (Signed)
Ok to give 3 month supply?

## 2012-04-14 NOTE — Telephone Encounter (Signed)
Refill for Gleevac 100mg  tablets pt to take 3 tablets days Q 90 ( 53month supply) sent to pt pharmacy.

## 2012-05-02 ENCOUNTER — Telehealth: Payer: Self-pay | Admitting: Medical Oncology

## 2012-05-02 NOTE — Telephone Encounter (Signed)
Pt called LVMOM regarding new health care insurance with [facility] and needing authorization for her Gleevac prescription for a 3 month supply. States she can receive a discount under a new plan for retirees if she orders 3 months, but needs MD to authorize. Informed patient will forward message to Thelma Barge, authorization specialist. Pt expressed verbal understanding.

## 2012-05-02 NOTE — Telephone Encounter (Signed)
Please see if Lanora Manis can help [place] with this

## 2012-06-09 ENCOUNTER — Encounter: Payer: Self-pay | Admitting: Internal Medicine

## 2012-06-14 ENCOUNTER — Other Ambulatory Visit: Payer: Self-pay

## 2012-07-18 ENCOUNTER — Encounter: Payer: Self-pay | Admitting: Internal Medicine

## 2012-07-18 ENCOUNTER — Ambulatory Visit (INDEPENDENT_AMBULATORY_CARE_PROVIDER_SITE_OTHER): Payer: Medicare Other | Admitting: Internal Medicine

## 2012-07-18 VITALS — BP 122/74 | HR 78 | Temp 97.9°F | Resp 12 | Ht 61.5 in | Wt 155.0 lb

## 2012-07-18 DIAGNOSIS — Z0000 Encounter for general adult medical examination without abnormal findings: Secondary | ICD-10-CM

## 2012-07-18 DIAGNOSIS — M899 Disorder of bone, unspecified: Secondary | ICD-10-CM

## 2012-07-18 DIAGNOSIS — M8580 Other specified disorders of bone density and structure, unspecified site: Secondary | ICD-10-CM

## 2012-07-18 DIAGNOSIS — C9210 Chronic myeloid leukemia, BCR/ABL-positive, not having achieved remission: Secondary | ICD-10-CM

## 2012-07-18 DIAGNOSIS — E785 Hyperlipidemia, unspecified: Secondary | ICD-10-CM

## 2012-07-18 DIAGNOSIS — Z853 Personal history of malignant neoplasm of breast: Secondary | ICD-10-CM | POA: Insufficient documentation

## 2012-07-18 LAB — TSH: TSH: 0.91 u[IU]/mL (ref 0.35–5.50)

## 2012-07-18 NOTE — Patient Instructions (Addendum)
Review and correct the record as indicated. Please share record with all medical staff seen.  

## 2012-07-18 NOTE — Progress Notes (Signed)
Subjective:    Patient ID: [name] [name], female    DOB: Oct 14, 1934, 78 y.o.   MRN: [id number]  HPI Medicare Wellness Visit:  Psychosocial & medical history were reviewed as required by Medicare (abuse,antisocial behavioral risks,firearm risk).  Social history: caffeine:none  , alcohol:14 glasses wine / week   ,  tobacco use:  Quit 1964 Exercise :  Walking 2 mpd 3X/ week No home & personal  safety / fall risk Activities of daily living: no limitations  Seatbelt  and smoke alarm employed. Power of Attorney/Living Will status : in place Ophthalmology exam current Hearing evaluation not current Orientation :oriented X 3  Memory & recall :good Math testing:good Mood & affect : normal . Depression / anxiety: denied Travel history : last 2009 [place] Immunization status : Shingles not allowed by Dr Welton Flakes Transfusion history:  none  Preventive health surveillance ( colonoscopies, BMD , mammograms,PAP as per protocol/ SOC): current / colonoscopy / mammogram /BMD/ PAP due Dental care:  Every 6 mos. Chart reviewed &  Updated. Active issues reviewed & addressed.      Review of Systems   Extensive labs done by Dr. Welton Flakes reviewed.  Surveillance studies are up to date; she has had significant osteopenia on bone density; it is due 08/2013. Vitamin D level is not up to date. She has not taken bisphosphonates to date.     Objective:   Physical Exam Gen.: Healthy and well-nourished in appearance. Alert, appropriate and cooperative throughout exam.Appears younger than stated age  Head: Normocephalic without obvious abnormalities  Eyes: No corneal or conjunctival inflammation noted. Pupils equal round reactive to light and accommodation. Extraocular motion intact. Vision grossly normal OD ; contact OS. Ears: External  ear exam reveals no significant lesions or deformities. Canals clear .TMs normal. Hearing is grossly normal bilaterally. Nose: External nasal exam reveals no deformity or  inflammation. Nasal mucosa are pink and moist. No lesions or exudates noted.   Mouth: Oral mucosa and oropharynx reveal no lesions or exudates. Teeth in good repair. Neck: No deformities, masses, or tenderness noted. Range of motion slightly decreased. Thyroid normal. Lungs: Normal respiratory effort; chest expands symmetrically. Lungs are clear to auscultation without rales, wheezes, or increased work of breathing. Heart: Normal rate and rhythm. Normal S1 and S2. No gallop, click, or rub. No murmur. Abdomen: Bowel sounds normal; abdomen soft and nontender. No masses, organomegaly or hernias noted.Aorta palpable ; no AAA Genitalia:As per Gyn                                  Musculoskeletal/extremities: No deformity or scoliosis noted of  the thoracic or lumbar spine.  No clubbing, cyanosis, edema, or significant extremity  deformity noted. Range of motion decreased R knee .Tone & strength  Normal. Joints normal. Nail health good.L hand in surgical dressing Able to lie down & sit up w/o help. Negative SLR bilaterally Vascular: Carotid, radial artery, dorsalis pedis and  posterior tibial pulses are full and equal. No bruits present. Neurologic: Alert and oriented x3. Deep tendon reflex decreased R knee      Skin: Intact without suspicious lesions or rashes. Lymph: No cervical, axillary lymphadenopathy present. Psych: Mood and affect are normal. Normally interactive  Assessment & Plan:  #1 Medicare Wellness Exam; criteria met ; data entered #2 Problem List reviewed ; Assessment/ Recommendations made Plan: see Orders

## 2012-07-22 LAB — VITAMIN D 1,25 DIHYDROXY: Vitamin D 1, 25 (OH)2 Total: 52 pg/mL (ref 18–72)

## 2012-07-26 ENCOUNTER — Other Ambulatory Visit: Payer: Self-pay | Admitting: Oncology

## 2012-07-30 ENCOUNTER — Encounter: Payer: Self-pay | Admitting: Internal Medicine

## 2012-07-30 ENCOUNTER — Ambulatory Visit (INDEPENDENT_AMBULATORY_CARE_PROVIDER_SITE_OTHER): Payer: Medicare Other | Admitting: Internal Medicine

## 2012-07-30 VITALS — BP 124/78 | HR 75 | Wt 155.6 lb

## 2012-07-30 DIAGNOSIS — M5412 Radiculopathy, cervical region: Secondary | ICD-10-CM

## 2012-07-30 DIAGNOSIS — R7309 Other abnormal glucose: Secondary | ICD-10-CM | POA: Insufficient documentation

## 2012-07-30 DIAGNOSIS — M5010 Cervical disc disorder with radiculopathy, unspecified cervical region: Secondary | ICD-10-CM

## 2012-07-30 MED ORDER — GABAPENTIN 100 MG PO CAPS: ORAL_CAPSULE | ORAL | Status: DC

## 2012-07-30 NOTE — Patient Instructions (Addendum)
Use a cervical memory foam pillow to prevent hyperextension or hyperflexion of the cervical spine. Order for x-rays entered into  the computer; these will be performed at [address] [facility]. across from [facility]. No appointment is necessary.

## 2012-07-30 NOTE — Progress Notes (Signed)
  Subjective:    Patient ID: [name] [name], female    DOB: Nov 02, 1934, 78 y.o.   MRN: [id number]  HPI The pain has been present for 4-5 days twice in past  6 weeks in  L occiput & L upper shoulder without associated injury or trigger . It is described as  sharp up to level 6. The pain does not radiate into LUE.   The discomfort lasts  hours . It is exacerbated by LLD position & @ computer. No associated redness, swelling, stiffness, skin color change, or  temperature change. "Waves" of tingling from L posterolateral neck to upper trapezius & anteriorly to clavicle. The pain was treated with NSAIDS & heat  ; response was significant           Review of Systems Constitutional: no fever, chills, sweats, change in weight  Musculoskeletal:no  muscle cramps or pain Neuro: no weakness; incontinence (stool/urine); no numbness  tingling Heme:no lymphadenopathy; abnormal bruising or bleeding        Objective:   Physical Exam  Gen.: Healthy and well-nourished in appearance. Alert, appropriate and cooperative throughout exam.  Neck: No deformities, masses, or tenderness noted. Range of motion slightly decreased. Thyroid normal. Neck is supple with no meningeal signs Eyes: EOMI , FOV WNL.                           Musculoskeletal/extremities: No deformity or scoliosis noted of  the thoracic or lumbar spine. No clubbing, cyanosis, edema, or significant extremity  deformity noted. Range of motion normal .Tone & strength  Normal to opposition.Joints normal. Nail health good. Able to lie down & sit up w/o help. Negative SLR bilaterally. No meningeal signs with range of motion testing of lower extremities Vascular: Carotid & radial artery  pulses are full and equal. No bruits present. Neurologic: Alert and oriented x3. Deep tendon reflexes symmetrical and normal. No cranial nerve deficit present Skin: Intact without suspicious lesions or rashes. Lymph: No cervical, axillary lymphadenopathy  present. Psych: Mood and affect are normal. Normally interactive                                                                                      Assessment & Plan:  #1 left cervical radiculopathy suggested at the level of 4-5 with associated pain and tingling. No neurologic deficit on exam.  Plan: See orders and recommendations

## 2012-07-31 ENCOUNTER — Ambulatory Visit (INDEPENDENT_AMBULATORY_CARE_PROVIDER_SITE_OTHER)
Admission: RE | Admit: 2012-07-31 | Discharge: 2012-07-31 | Disposition: A | Discharge status: Home / Self Care | Payer: Medicare Other | Source: Ambulatory Visit | Attending: Internal Medicine | Admitting: Internal Medicine

## 2012-07-31 ENCOUNTER — Other Ambulatory Visit: Payer: Self-pay | Admitting: Medical Oncology

## 2012-07-31 ENCOUNTER — Telehealth: Payer: Self-pay | Admitting: *Deleted

## 2012-07-31 ENCOUNTER — Encounter: Payer: Self-pay | Admitting: Internal Medicine

## 2012-07-31 DIAGNOSIS — M5412 Radiculopathy, cervical region: Secondary | ICD-10-CM

## 2012-07-31 DIAGNOSIS — M5010 Cervical disc disorder with radiculopathy, unspecified cervical region: Secondary | ICD-10-CM

## 2012-07-31 MED ORDER — GLEEVEC 100 MG PO TABS: 300.0000 mg | ORAL_TABLET | Freq: Every day | ORAL | Status: DC

## 2012-07-31 NOTE — Telephone Encounter (Signed)
Patient left message on triage requesting clarification on Neurontin. Called pt back lmovm with clear instruction on how to correctly take medication.

## 2012-07-31 NOTE — Telephone Encounter (Signed)
Per MD, ok to refill pt's request for Gleevec for 3 month supply. Refill sent to pt's listed pharmacy. Pt informed. Knows to call office with any questions or concerns.

## 2012-09-04 ENCOUNTER — Other Ambulatory Visit (HOSPITAL_BASED_OUTPATIENT_CLINIC_OR_DEPARTMENT_OTHER): Payer: Medicare Other | Admitting: Lab

## 2012-09-04 ENCOUNTER — Telehealth: Payer: Self-pay | Admitting: Oncology

## 2012-09-04 ENCOUNTER — Other Ambulatory Visit: Payer: Medicare Other | Admitting: Lab

## 2012-09-04 ENCOUNTER — Ambulatory Visit: Payer: Medicare Other | Admitting: Oncology

## 2012-09-04 ENCOUNTER — Ambulatory Visit (HOSPITAL_BASED_OUTPATIENT_CLINIC_OR_DEPARTMENT_OTHER): Payer: Medicare Other | Admitting: Oncology

## 2012-09-04 VITALS — BP 118/71 | HR 88 | Temp 98.4°F | Resp 20 | Ht 61.5 in | Wt 154.9 lb

## 2012-09-04 DIAGNOSIS — C9211 Chronic myeloid leukemia, BCR/ABL-positive, in remission: Secondary | ICD-10-CM

## 2012-09-04 DIAGNOSIS — C9210 Chronic myeloid leukemia, BCR/ABL-positive, not having achieved remission: Secondary | ICD-10-CM

## 2012-09-04 LAB — CBC WITH DIFFERENTIAL/PLATELET
BASO%: 0.4 % (ref 0.0–2.0)
Eosinophils Absolute: 0.1 10*3/uL (ref 0.0–0.5)
LYMPH%: 27.2 % (ref 14.0–49.7)
MCHC: 35 g/dL (ref 31.5–36.0)
MONO#: 0.5 10*3/uL (ref 0.1–0.9)
NEUT#: 2.4 10*3/uL (ref 1.5–6.5)
Platelets: 152 10*3/uL (ref 145–400)
RBC: 2.79 10*6/uL — ABNORMAL LOW (ref 3.70–5.45)
RDW: 13.6 % (ref 11.2–14.5)
WBC: 4.1 10*3/uL (ref 3.9–10.3)

## 2012-09-04 LAB — COMPREHENSIVE METABOLIC PANEL (CC13)
ALT: 18 U/L (ref 0–55)
AST: 31 U/L (ref 5–34)
CO2: 27 mEq/L (ref 22–29)
Calcium: 9.3 mg/dL (ref 8.4–10.4)
Chloride: 105 mEq/L (ref 98–107)
Creatinine: 1.1 mg/dL (ref 0.6–1.1)
Sodium: 141 mEq/L (ref 136–145)
Total Bilirubin: 0.76 mg/dL (ref 0.20–1.20)
Total Protein: 6.9 g/dL (ref 6.4–8.3)

## 2012-09-04 MED ORDER — GLEEVEC 100 MG PO TABS: 300.0000 mg | ORAL_TABLET | Freq: Every day | ORAL | Status: DC

## 2012-09-04 NOTE — Patient Instructions (Signed)
Doing well  Continue gleevec 300 mg daily   I will see you back in December

## 2012-09-08 ENCOUNTER — Ambulatory Visit: Payer: Medicare Other | Admitting: Oncology

## 2012-09-08 ENCOUNTER — Other Ambulatory Visit: Payer: Medicare Other | Admitting: Lab

## 2012-09-08 NOTE — Progress Notes (Signed)
OFFICE PROGRESS NOTE   Marga Melnick, MD [phone] [address]. [facility] [address] [place] [address]  DIAGNOSIS: 78 year old female with CML originally diagnosed in 2004  PRIOR THERAPY: Patient has been on Gleevec 300 mg daily since diagnosis  CURRENT THERAPY: Gleevec 300 mg daily  INTERVAL HISTORY: [name] [name] 78 y.o. female returns for followup visit today overall she is doing well feeling well. She's tolerating the Gleevec. Her white counts look stable. She does have chronic diarrhea she is on Imodium she is followed by Dr. Arlyce Dice for this. She otherwise denies any nausea vomiting fevers chills night sweats headaches no easy bruising no abdominal pain no distention. No peripheral paresthesias. No chest pains. Remainder of the 10 point review of systems is negative.  MEDICAL HISTORY: Past Medical History  Diagnosis Date  . Breast cancer     S/P lumpectomy , radiation, & Tamoxifen  . CML (chronic myeloid leukemia)     Dr Park Breed  . Arthritis     DJD    ALLERGIES:  is allergic to penicillins.  MEDICATIONS:  Current Outpatient Prescriptions  Medication Sig Dispense Refill  . aspirin 81 MG tablet Take 81 mg by mouth daily.        . Boswellia-Glucosamine-Vit D (GLUCOSAMINE COMPLEX PO) Take by mouth 2 (two) times daily.      . folic acid (FOLVITE) 1 MG tablet Take 1 tablet (1 mg total) by mouth daily.  90 tablet  12  . GLEEVEC 100 MG tablet Take 3 tablets (300 mg total) by mouth daily. Take with meals and large glass of water.Caution:Chemotherapy  270 tablet  3  . loperamide (IMODIUM A-D) 2 MG tablet Take 1 mg by mouth daily.      . Multiple Vitamin (MULTIVITAMIN) tablet Take 1 tablet by mouth daily.      Marland Kitchen CALCIUM-VITAMIN D PO Take by mouth 2 (two) times daily.      Marland Kitchen gabapentin (NEURONTIN) 100 MG capsule One pill every eight hours as needed; dose may be increased by one pill each dose after 72 hours if only partially effective  30 capsule  2   No current  facility-administered medications for this visit.    SURGICAL HISTORY:  Past Surgical History  Procedure Laterality Date  . Knee surgery      Arthroscopy, right knee   . Breast surgery      Right lumpectomy, s/p radiation 1993 and oral  Tamoxifen x 5 years ([id number])   . Appendectomy    . Abdominal hysterectomy      no BSO, for Dysplasia   . Colonoscopy  03/2003    diverticulosis, Dr.Kaplan  . Sigmoid colon resection  2010     Dr Edwyna Shell, [place]  . Ileostomy reversal      3 mos after above  . Bilateral salpingoophorectomy  2010    with bowel resection  . Gastrocslide      left leg; Dr Lestine Box    REVIEW OF SYSTEMS:  Pertinent items are noted in HPI.   PHYSICAL EXAMINATION: General appearance: alert, cooperative and appears stated age Neck: no adenopathy, no carotid bruit, no JVD, supple, symmetrical, trachea midline and thyroid not enlarged, symmetric, no tenderness/mass/nodules Lymph nodes: Cervical, supraclavicular, and axillary nodes normal. Resp: clear to auscultation bilaterally and normal percussion bilaterally Back: symmetric, no curvature. ROM normal. No CVA tenderness. Cardio: regular rate and rhythm, S1, S2 normal, no murmur, click, rub or gallop GI: soft, non-tender; bowel sounds normal; no masses,  no organomegaly Extremities:  extremities normal, atraumatic, no cyanosis or edema Neurologic: Alert and oriented X 3, normal strength and tone. Normal symmetric reflexes. Normal coordination and gait  ECOG PERFORMANCE STATUS: 0 - Asymptomatic  Blood pressure 118/71, pulse 88, temperature 98.4 F (36.9 C), temperature source Oral, resp. rate 20, height 5' 1.5" (1.562 m), weight 154 lb 14.4 oz (70.262 kg).  LABORATORY DATA: Lab Results  Component Value Date   WBC 4.1 09/04/2012   HGB 11.1* 09/04/2012   HCT 31.7* 09/04/2012   MCV 113.5* 09/04/2012   PLT 152 09/04/2012      Chemistry      Component Value Date/Time   NA 141 09/04/2012 1006   NA 140 09/10/2011 1143   NA  135 09/13/2009 0924   K 4.1 09/04/2012 1006   K 4.5 09/10/2011 1143   K 4.3 09/13/2009 0924   CL 105 09/04/2012 1006   CL 102 09/10/2011 1143   CL 98 09/13/2009 0924   CO2 27 09/04/2012 1006   CO2 30 09/10/2011 1143   CO2 30 09/13/2009 0924   BUN 29.1* 09/04/2012 1006   BUN 21 09/10/2011 1143   BUN 23* 09/13/2009 0924   CREATININE 1.1 09/04/2012 1006   CREATININE 0.99 09/10/2011 1143   CREATININE 1.0 09/13/2009 0924      Component Value Date/Time   CALCIUM 9.3 09/04/2012 1006   CALCIUM 9.4 09/10/2011 1143   CALCIUM 9.3 09/13/2009 0924   ALKPHOS 60 09/04/2012 1006   ALKPHOS 58 09/10/2011 1143   ALKPHOS 72 09/13/2009 0924   AST 31 09/04/2012 1006   AST 39* 09/10/2011 1143   AST 32 09/13/2009 0924   ALT 18 09/04/2012 1006   ALT 29 09/10/2011 1143   BILITOT 0.76 09/04/2012 1006   BILITOT 0.7 09/10/2011 1143   BILITOT 0.80 09/13/2009 0924       RADIOGRAPHIC STUDIES:  No results found.  ASSESSMENT: 78 year old female with CML in remission currently on Gleevec. Overall she's doing well she is tolerating the Gleevec quite well. We will send her blood for peripheral for BCR/ABL. She also has had a CBC her counts low pretty stable.   PLAN: See patient back in about 6 months time which will be in December 2014. At that time she will have a CBC CMET and BCR/ABL analysis. After which she will be gone back to [place]  All questions were answered. The patient knows to call the clinic with any problems, questions or concerns. We can certainly see the patient much sooner if necessary.  I spent 25 minutes counseling the patient face to face. The total time spent in the appointment was 30 minutes.    Drue Second, MD Medical/Oncology [facility] [phone] (beeper) [phone] (Office)  09/08/2012, 12:43 AM

## 2012-09-19 LAB — BCR/ABL

## 2012-11-27 ENCOUNTER — Telehealth: Payer: Self-pay | Admitting: Emergency Medicine

## 2012-11-27 NOTE — Telephone Encounter (Signed)
Recent labs reviewed per Dr Welton Flakes, informed patient to continue current dose of Hydrea. Also gave patient results of BCR testing per Dr Welton Flakes patient is in remission. Patient verbalized understanding and no voiced no further questions or concerns.

## 2012-12-08 ENCOUNTER — Other Ambulatory Visit: Payer: Self-pay | Admitting: *Deleted

## 2012-12-08 DIAGNOSIS — C9211 Chronic myeloid leukemia, BCR/ABL-positive, in remission: Secondary | ICD-10-CM

## 2012-12-08 MED ORDER — FOLIC ACID 1 MG PO TABS: 1.0000 mg | ORAL_TABLET | Freq: Every day | ORAL | Status: DC

## 2012-12-10 ENCOUNTER — Encounter: Payer: Self-pay | Admitting: Oncology

## 2013-01-06 ENCOUNTER — Telehealth: Payer: Self-pay | Admitting: Emergency Medicine

## 2013-01-06 NOTE — Telephone Encounter (Signed)
Left message for patient instructing her per Dr Welton Flakes to continue current dose of Gleevec. Instructed patient to call with any questions or concerns.

## 2013-01-16 ENCOUNTER — Telehealth: Payer: Self-pay | Admitting: Emergency Medicine

## 2013-01-16 NOTE — Telephone Encounter (Signed)
Called patient and left message for patient to continue same dose of Gleevec per Dr Welton Flakes. Instructed patient to call with any questions or concerns.

## 2013-02-02 ENCOUNTER — Encounter: Payer: Self-pay | Admitting: Oncology

## 2013-02-27 ENCOUNTER — Encounter: Payer: Self-pay | Admitting: Internal Medicine

## 2013-02-27 ENCOUNTER — Ambulatory Visit (INDEPENDENT_AMBULATORY_CARE_PROVIDER_SITE_OTHER): Payer: Medicare Other | Admitting: Internal Medicine

## 2013-02-27 VITALS — BP 124/77 | HR 86 | Temp 98.7°F | Resp 12 | Wt 157.6 lb

## 2013-02-27 DIAGNOSIS — M1710 Unilateral primary osteoarthritis, unspecified knee: Secondary | ICD-10-CM

## 2013-02-27 DIAGNOSIS — C9210 Chronic myeloid leukemia, BCR/ABL-positive, not having achieved remission: Secondary | ICD-10-CM

## 2013-02-27 DIAGNOSIS — R197 Diarrhea, unspecified: Secondary | ICD-10-CM

## 2013-02-27 DIAGNOSIS — M1711 Unilateral primary osteoarthritis, right knee: Secondary | ICD-10-CM

## 2013-02-27 DIAGNOSIS — Z01810 Encounter for preprocedural cardiovascular examination: Secondary | ICD-10-CM

## 2013-02-27 NOTE — Assessment & Plan Note (Signed)
She's actually  describing intermittent loose stool but no frank diarrhea. This would not be an active issue which would preclude surgery

## 2013-02-27 NOTE — Assessment & Plan Note (Signed)
Current CBC and differential would be needed prior to surgery. It must be verified that the anemia and thrombocytopenia are not significant. She may be a candidate for autologous blood transfusions. This should be discussed with Dr Welton Flakes

## 2013-02-27 NOTE — Assessment & Plan Note (Signed)
Symptoms and findings warrant surgical intervention.

## 2013-02-27 NOTE — Patient Instructions (Signed)
Please notify Dr. France Ravens  Nurse that preoperative clearance has been completed and is in Epic for review.  Please discuss the questions concerning autologous transfusions with him and Dr Welton Flakes.

## 2013-02-27 NOTE — Progress Notes (Signed)
Subjective:    Patient ID: [name] [name], female    DOB: [birth year], 78 y.o.   MRN: [id number]  HPI  Total knee replacement is to be scheduled on the right by Dr. Thomasena Edis for end-stage degenerative joint disease. This is in the context of anterior cruciate ligament and meniscal tear in 1990. That time she had arthroscopic repair. She's also had intra-articular steroids as well as serial injections of rooster comb. She also is on the supplement glucosamine.  She has difficulty climbing stairs; she has pain at night. The symptoms are progressing.     Review of Systems  Her bone mineral density is up to date. She had -1.8 T score @ femoral neck in May of 2013. A heart healthy diet is followed; exercise encompasses 35-40  minutes 5 times per week as walking  without symptoms. Specifically denied are  chest pain, palpitations, dyspnea, or claudication. She has elevated cholesterol but this is related to extremely high HDL which would be protective. Family history is negative  for premature coronary disease.  She is followed by Dr. Welton Flakes for chronic myeloid leukemia. Her most recent CBC and differential was performed 09/04/12. This exhibited a mild anemia at 31.7 which is essentially stable. Additionally she's had minimal thrombocytopenia in the past with platelet counts as low as 130,000.  TSH was therapeutic in March of this year. A1c was nondiabetic in April.  She continues to have intermittent loose stool which she contributes to her chemotherapeutic agents. There is a question of irritable bowel in her past history.          Objective:   Physical Exam Gen.: Healthy and well-nourished in appearance. Alert, appropriate and cooperative throughout exam.Appears younger than stated age  Head: Normocephalic without obvious abnormalities Eyes: No corneal or conjunctival inflammation noted. No icterus Nose: External nasal exam reveals no deformity or inflammation. Nasal mucosa are pink and  moist. No lesions or exudates noted.   Mouth: Oral mucosa and oropharynx reveal no lesions or exudates. Teeth in good repair. Neck: No deformities, masses, or tenderness noted. Thyroid normal. Lungs: Normal respiratory effort; chest expands symmetrically. Lungs are clear to auscultation without rales, wheezes, or increased work of breathing. Heart: Normal rate and rhythm. Normal S 2 ; accentuated S1. No gallop, click, or rub. No murmur. Abdomen: Bowel sounds normal; abdomen soft and nontender. No masses, organomegaly or hernias noted.                                  Musculoskeletal/extremities: No deformity or scoliosis noted of  the thoracic or lumbar spine.   No clubbing, cyanosis, edema, or significant extremity  deformity noted. Range of motion normal .Tone & strength  Normal. Fusiform enlargement of the right knee with crepitus.  Nail health good. Able to lie down & sit up w/o help. Negative SLR bilaterally Vascular: Carotid, radial artery, dorsalis pedis and  posterior tibial pulses are full and equal. No bruits present. Neurologic: Alert and oriented x3. Deep tendon reflexes symmetrical and normal.          Skin: Intact without suspicious lesions or rashes. Lymph: No cervical, axillary lymphadenopathy present. Psych: Mood and affect are normal. Normally interactive  Assessment & Plan:  See Current Assessment & Plan in Problem List under specific Diagnosis

## 2013-03-05 ENCOUNTER — Other Ambulatory Visit: Payer: Self-pay

## 2013-03-06 ENCOUNTER — Other Ambulatory Visit: Payer: Medicare Other

## 2013-03-20 ENCOUNTER — Other Ambulatory Visit: Payer: Self-pay | Admitting: Orthopedic Surgery

## 2013-04-07 ENCOUNTER — Ambulatory Visit (HOSPITAL_BASED_OUTPATIENT_CLINIC_OR_DEPARTMENT_OTHER): Payer: Medicare Other | Admitting: Oncology

## 2013-04-07 ENCOUNTER — Other Ambulatory Visit (HOSPITAL_BASED_OUTPATIENT_CLINIC_OR_DEPARTMENT_OTHER): Payer: Medicare Other | Admitting: Lab

## 2013-04-07 ENCOUNTER — Encounter: Payer: Self-pay | Admitting: Oncology

## 2013-04-07 ENCOUNTER — Telehealth: Payer: Self-pay | Admitting: Oncology

## 2013-04-07 VITALS — BP 108/74 | HR 77 | Temp 97.8°F | Resp 18 | Ht 61.0 in | Wt 148.0 lb

## 2013-04-07 DIAGNOSIS — C9211 Chronic myeloid leukemia, BCR/ABL-positive, in remission: Secondary | ICD-10-CM

## 2013-04-07 DIAGNOSIS — Z853 Personal history of malignant neoplasm of breast: Secondary | ICD-10-CM

## 2013-04-07 DIAGNOSIS — C9210 Chronic myeloid leukemia, BCR/ABL-positive, not having achieved remission: Secondary | ICD-10-CM

## 2013-04-07 LAB — COMPREHENSIVE METABOLIC PANEL (CC13)
ALT: 22 U/L (ref 0–55)
AST: 34 U/L (ref 5–34)
Alkaline Phosphatase: 51 U/L (ref 40–150)
Anion Gap: 11 mEq/L (ref 3–11)
BUN: 22.4 mg/dL (ref 7.0–26.0)
Calcium: 9.4 mg/dL (ref 8.4–10.4)
Creatinine: 1 mg/dL (ref 0.6–1.1)
Glucose: 95 mg/dl (ref 70–140)
Total Bilirubin: 0.63 mg/dL (ref 0.20–1.20)
Total Protein: 6.8 g/dL (ref 6.4–8.3)

## 2013-04-07 LAB — CBC WITH DIFFERENTIAL/PLATELET
BASO%: 0.9 % (ref 0.0–2.0)
EOS%: 2.3 % (ref 0.0–7.0)
HCT: 33.8 % — ABNORMAL LOW (ref 34.8–46.6)
LYMPH%: 41.8 % (ref 14.0–49.7)
MCH: 38.2 pg — ABNORMAL HIGH (ref 25.1–34.0)
MCHC: 34 g/dL (ref 31.5–36.0)
MONO%: 13 % (ref 0.0–14.0)
NEUT%: 42 % (ref 38.4–76.8)
Platelets: 191 10*3/uL (ref 145–400)
RBC: 3.01 10*6/uL — ABNORMAL LOW (ref 3.70–5.45)

## 2013-04-07 NOTE — Progress Notes (Signed)
OFFICE PROGRESS NOTE   Marga Melnick, MD [phone] W. [facility] [address] [place] [address]  DIAGNOSIS: 78 year old female with CML originally diagnosed in 2004  PRIOR THERAPY: Patient has been on Gleevec 300 mg daily since diagnosis  CURRENT THERAPY: Gleevec 300 mg daily  INTERVAL HISTORY: [name] [name] 78 y.o. female returns for followup visit today overall she is doing well feeling well. She's tolerating the Gleevec. Patient is planning on having left knee surgery in January. Throughout this she will continue the weekly back. Otherwise patient is doing well no fevers chills night sweats headaches shortness of breath chest pains palpitations no easy bruising no bleeding she has not noticed any lymphadenopathy. No rashes. Remainder of the 10 point review of systems is negative.  MEDICAL HISTORY: Past Medical History  Diagnosis Date  . Breast cancer     S/P lumpectomy , radiation, & Tamoxifen  . CML (chronic myeloid leukemia)     Dr Park Breed  . Arthritis     DJD    ALLERGIES:  is allergic to penicillins.  MEDICATIONS:  Current Outpatient Prescriptions  Medication Sig Dispense Refill  . aspirin 81 MG tablet Take 81 mg by mouth daily.        . Boswellia-Glucosamine-Vit D (GLUCOSAMINE COMPLEX PO) Take by mouth 2 (two) times daily.      Marland Kitchen CALCIUM-VITAMIN D PO Take by mouth 2 (two) times daily.      . folic acid (FOLVITE) 1 MG tablet Take 1 tablet (1 mg total) by mouth daily.  90 tablet  1  . GLEEVEC 100 MG tablet Take 3 tablets (300 mg total) by mouth daily. Take with meals and large glass of water.Caution:Chemotherapy  270 tablet  3  . loperamide (IMODIUM A-D) 2 MG tablet Take 1 mg by mouth daily.      . Multiple Vitamin (MULTIVITAMIN) tablet Take 1 tablet by mouth daily.       No current facility-administered medications for this visit.    SURGICAL HISTORY:  Past Surgical History  Procedure Laterality Date  . Knee surgery      Arthroscopy, right knee   .  Breast surgery      Right lumpectomy, s/p radiation 1993 and oral  Tamoxifen x 5 years ([id number])   . Appendectomy    . Abdominal hysterectomy      no BSO, for Dysplasia   . Colonoscopy  03/2003    diverticulosis, Dr.Kaplan  . Sigmoid colon resection  2010     Dr Edwyna Shell, [place]  . Ileostomy reversal      3 mos after above  . Bilateral salpingoophorectomy  2010    with bowel resection  . Gastrocslide      left leg; Dr Lestine Box    REVIEW OF SYSTEMS:  Pertinent items are noted in HPI.   PHYSICAL EXAMINATION: General appearance: alert, cooperative and appears stated age Neck: no adenopathy, no carotid bruit, no JVD, supple, symmetrical, trachea midline and thyroid not enlarged, symmetric, no tenderness/mass/nodules Lymph nodes: Cervical, supraclavicular, and axillary nodes normal. Resp: clear to auscultation bilaterally and normal percussion bilaterally Back: symmetric, no curvature. ROM normal. No CVA tenderness. Cardio: regular rate and rhythm, S1, S2 normal, no murmur, click, rub or gallop GI: soft, non-tender; bowel sounds normal; no masses,  no organomegaly Extremities: extremities normal, atraumatic, no cyanosis or edema Neurologic: Alert and oriented X 3, normal strength and tone. Normal symmetric reflexes. Normal coordination and gait  ECOG PERFORMANCE STATUS: 0 - Asymptomatic  Blood pressure  108/74, pulse 77, temperature 97.8 F (36.6 C), temperature source Oral, resp. rate 18, height 5\' 1"  (1.549 m), weight 148 lb (67.132 kg).  LABORATORY DATA: Lab Results  Component Value Date   WBC 3.7* 04/07/2013   HGB 11.5* 04/07/2013   HCT 33.8* 04/07/2013   MCV 112.3* 04/07/2013   PLT 191 04/07/2013      Chemistry      Component Value Date/Time   NA 141 04/07/2013 0837   NA 140 09/10/2011 1143   NA 135 09/13/2009 0924   K 4.4 04/07/2013 0837   K 4.5 09/10/2011 1143   K 4.3 09/13/2009 0924   CL 105 09/04/2012 1006   CL 102 09/10/2011 1143   CL 98 09/13/2009 0924   CO2 27  04/07/2013 0837   CO2 30 09/10/2011 1143   CO2 30 09/13/2009 0924   BUN 22.4 04/07/2013 0837   BUN 21 09/10/2011 1143   BUN 23* 09/13/2009 0924   CREATININE 1.0 04/07/2013 0837   CREATININE 0.99 09/10/2011 1143   CREATININE 1.0 09/13/2009 0924      Component Value Date/Time   CALCIUM 9.4 04/07/2013 0837   CALCIUM 9.4 09/10/2011 1143   CALCIUM 9.3 09/13/2009 0924   ALKPHOS 51 04/07/2013 0837   ALKPHOS 58 09/10/2011 1143   ALKPHOS 72 09/13/2009 0924   AST 34 04/07/2013 0837   AST 39* 09/10/2011 1143   AST 32 09/13/2009 0924   ALT 22 04/07/2013 0837   ALT 29 09/10/2011 1143   ALT 24 09/13/2009 0924   BILITOT 0.63 04/07/2013 0837   BILITOT 0.7 09/10/2011 1143   BILITOT 0.80 09/13/2009 0924       RADIOGRAPHIC STUDIES:  No results found.  ASSESSMENT: 78 year old female with CML in remission currently on Gleevec. Overall she's doing well she is tolerating the Gleevec quite well. We will send her blood for peripheral for BCR/ABL. She also has had a CBC her counts low pretty stable.   PLAN: See patient back in about 6 months time which will be in may 2015. At that time she will have a CBC CMET and BCR/ABL analysis. After which she will be gone back to [place]. Patient will proceed with her knee surgery in January  All questions were answered. The patient knows to call the clinic with any problems, questions or concerns. We can certainly see the patient much sooner if necessary.  I spent 25 minutes counseling the patient face to face. The total time spent in the appointment was 30 minutes.    Drue Second, MD Medical/Oncology [facility] [phone] (beeper) [phone] (Office)  04/07/2013, 10:20 AM

## 2013-04-17 LAB — BCR/ABL

## 2013-04-17 NOTE — H&P (Signed)
TOTAL KNEE ADMISSION H&P  Patient is being admitted for right total knee arthroplasty.  Subjective:  Chief Complaint:right knee pain.  HPI: [name] [name], 78 y.o. female, has a history of pain and functional disability in the right knee due to arthritis and has failed non-surgical conservative treatments for greater than 12 weeks to includeNSAID's and/or analgesics, corticosteriod injections, viscosupplementation injections, supervised PT with diminished ADL's post treatment and weight reduction as appropriate.  Onset of symptoms was gradual, starting 4 years ago with gradually worsening course since that time. The patient noted prior procedures on the knee to include  arthroscopy on the right knee(s).  Patient currently rates pain in the right knee(s) at 7 out of 10 with activity. Patient has worsening of pain with activity and weight bearing, pain that interferes with activities of daily living, pain with passive range of motion and joint swelling.  Patient has evidence of subchondral sclerosis, periarticular osteophytes, joint subluxation and joint space narrowing by imaging studies. This patient has had Osteoarthritis. There is no active infection.  Patient Active Problem List   Diagnosis Date Noted  . 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 Other abnormal glucose 07/30/2012  . Breast cancer 07/18/2012  . Osteopenia 09/15/2011  . Diarrhea 08/29/2011  . Family history of malignant neoplasm of gastrointestinal tract 08/29/2011  . HYPERLIPIDEMIA 04/14/2009  . ANEMIA 04/14/2009  . VISUAL IMPAIRMENT, TRANSIENT 04/14/2009  . RECTOVAGINAL FISTULA 11/10/2008  . DIVERTICULOSIS OF COLON 04/06/2008  . LEUKEMIA, MYELOID, CHRONIC 02/18/2007  . ROSACEA 02/18/2007   Past Medical History  Diagnosis Date  . Breast cancer     S/P lumpectomy , radiation, & Tamoxifen  . CML (chronic myeloid leukemia)     Dr Park Breed  . Arthritis     DJD    Past Surgical History  Procedure Laterality Date  . Knee surgery       Arthroscopy, right knee   . Breast surgery      Right lumpectomy, s/p radiation 1993 and oral  Tamoxifen x 5 years ([id number])   . Appendectomy    . Abdominal hysterectomy      no BSO, for Dysplasia   . Colonoscopy  03/2003    diverticulosis, Dr.Kaplan  . Sigmoid colon resection  2010     Dr Edwyna Shell, [place]  . Ileostomy reversal      3 mos after above  . Bilateral salpingoophorectomy  2010    with bowel resection  . Gastrocslide      left leg; Dr Lestine Box    No prescriptions prior to admission   Allergies  Allergen Reactions  . Penicillins     Hives Because of a history of documented adverse serious drug reaction;Medi Alert bracelet  is recommended    History  Substance Use Topics  . Smoking status: Former Smoker    Quit date: 04/30/1962  . Smokeless tobacco: Never Used     Comment: Age 68   . Alcohol Use: 8.4 oz/week    14 Glasses of wine per week     Comment: 2 glasses wine in evening    Family History  Problem Relation Age of Onset  . Colon cancer Mother 63  . Uterine cancer Sister   . Diabetes Cousin   . Cancer Father     Lip cancer  . Breast cancer Paternal Aunt   . Heart attack Paternal Uncle 73  . Stroke Neg Hx      Review of Systems  Constitutional: Negative.   HENT: Negative.   Eyes:  Negative.   Respiratory: Negative.   Cardiovascular: Negative.   Gastrointestinal: Negative.   Genitourinary: Negative.   Musculoskeletal: Positive for joint pain.  Skin: Negative.   Neurological: Negative.   Endo/Heme/Allergies:       Hx Leukemia  Psychiatric/Behavioral: Negative.     Objective:  Physical Exam  Constitutional: She is oriented to person, place, and time. She appears well-developed and well-nourished.  HENT:  Head: Normocephalic.  Eyes: EOM are normal.  Neck: Normal range of motion.  Cardiovascular: Normal rate, regular rhythm and intact distal pulses.   Respiratory: Effort normal and breath sounds normal.  GI: Soft. Bowel sounds are  normal.  Genitourinary:  Deferred  Musculoskeletal: She exhibits edema and tenderness.  Right Knee  Neurological: She is alert and oriented to person, place, and time.  Skin: Skin is warm and dry.  Psychiatric: Her behavior is normal.    Vital signs in last 24 hours:    Labs:   Estimated body mass index is 29.30 kg/(m^2) as calculated from the following:   Height as of 09/04/12: 5' 1.5" (1.562 m).   Weight as of 02/27/13: 71.487 kg (157 lb 9.6 oz).   Imaging Review Plain radiographs demonstrate moderate degenerative joint disease of the right knee(s). The overall alignment ismild varus. The bone quality appears to be good for age and reported activity level.  Assessment/Plan:  End stage arthritis, right knee   The patient history, physical examination, clinical judgment of the provider and imaging studies are consistent with end stage degenerative joint disease of the right knee(s) and total knee arthroplasty is deemed medically necessary. The treatment options including medical management, injection therapy arthroscopy and arthroplasty were discussed at length. The risks and benefits of total knee arthroplasty were presented and reviewed. The risks due to aseptic loosening, infection, stiffness, patella tracking problems, thromboembolic complications and other imponderables were discussed. The patient acknowledged the explanation, agreed to proceed with the plan and consent was signed. Patient is being admitted for inpatient treatment for surgery, pain control, PT, OT, prophylactic antibiotics, VTE prophylaxis, progressive ambulation and ADL's and discharge planning. The patient is planning to be discharged home with home health services

## 2013-04-21 ENCOUNTER — Encounter (HOSPITAL_COMMUNITY): Payer: Self-pay | Admitting: Pharmacy Technician

## 2013-04-28 ENCOUNTER — Ambulatory Visit (HOSPITAL_COMMUNITY)
Admission: RE | Admit: 2013-04-28 | Discharge: 2013-04-28 | Disposition: A | Discharge status: Home / Self Care | Payer: Medicare Other | Source: Ambulatory Visit | Attending: Specialist | Admitting: Specialist

## 2013-04-28 ENCOUNTER — Encounter (HOSPITAL_COMMUNITY): Payer: Self-pay

## 2013-04-28 ENCOUNTER — Encounter (HOSPITAL_COMMUNITY)
Admission: RE | Admit: 2013-04-28 | Discharge: 2013-04-28 | Disposition: A | Discharge status: Home / Self Care | Payer: Medicare Other | Source: Ambulatory Visit | Attending: Specialist | Admitting: Specialist

## 2013-04-28 ENCOUNTER — Telehealth: Payer: Self-pay | Admitting: Internal Medicine

## 2013-04-28 DIAGNOSIS — Z01812 Encounter for preprocedural laboratory examination: Secondary | ICD-10-CM | POA: Insufficient documentation

## 2013-04-28 DIAGNOSIS — M1710 Unilateral primary osteoarthritis, unspecified knee: Secondary | ICD-10-CM | POA: Insufficient documentation

## 2013-04-28 DIAGNOSIS — Z01818 Encounter for other preprocedural examination: Secondary | ICD-10-CM | POA: Insufficient documentation

## 2013-04-28 LAB — COMPREHENSIVE METABOLIC PANEL
ALT: 18 U/L (ref 0–35)
AST: 37 U/L (ref 0–37)
Alkaline Phosphatase: 58 U/L (ref 39–117)
BUN: 24 mg/dL — ABNORMAL HIGH (ref 6–23)
CO2: 29 mEq/L (ref 19–32)
Calcium: 9 mg/dL (ref 8.4–10.5)
Potassium: 4.5 mEq/L (ref 3.7–5.3)
Sodium: 142 mEq/L (ref 137–147)
Total Protein: 6.4 g/dL (ref 6.0–8.3)

## 2013-04-28 LAB — URINALYSIS, ROUTINE W REFLEX MICROSCOPIC
Glucose, UA: NEGATIVE mg/dL
Hgb urine dipstick: NEGATIVE
Leukocytes, UA: NEGATIVE
Nitrite: NEGATIVE
Protein, ur: NEGATIVE mg/dL
pH: 6 (ref 5.0–8.0)

## 2013-04-28 LAB — CBC
Hemoglobin: 10.2 g/dL — ABNORMAL LOW (ref 12.0–15.0)
MCH: 38.2 pg — ABNORMAL HIGH (ref 26.0–34.0)
MCHC: 34.9 g/dL (ref 30.0–36.0)
Platelets: 123 10*3/uL — ABNORMAL LOW (ref 150–400)
RBC: 2.67 MIL/uL — ABNORMAL LOW (ref 3.87–5.11)
WBC: 2.7 10*3/uL — ABNORMAL LOW (ref 4.0–10.5)

## 2013-04-28 LAB — APTT: aPTT: 28 seconds (ref 24–37)

## 2013-04-28 LAB — SURGICAL PCR SCREEN
MRSA, PCR: NEGATIVE
Staphylococcus aureus: NEGATIVE

## 2013-04-28 LAB — PROTIME-INR: INR: 0.99 (ref 0.00–1.49)

## 2013-04-28 NOTE — Progress Notes (Signed)
EKG  10/14 EPIC

## 2013-04-28 NOTE — Progress Notes (Signed)
Faxed CBC, CMET to Dr Thomasena Edis by [facility]

## 2013-04-28 NOTE — Telephone Encounter (Signed)
Please advise 

## 2013-04-28 NOTE — Progress Notes (Signed)
PST NOTE-  Patient states desires to continue Gleevec morning of surgery even though causes some nausea- no vomiting.  States was told its anesthesia decision.  Spoke with Dr Regan Lemming while patient in our lab and states is OK- patient and husband informed.  Instructed her to experiment with clear liquids between now and surgery day to see if a particular one would help with the nausea

## 2013-04-28 NOTE — Patient Instructions (Addendum)
20 [name] [name]  04/28/2013   Your procedure is scheduled on:  05/05/13  TUESDAY  Report to [facility] at    12 NOON  Call this number if you have problems the morning of surgery: [phone]       Remember:   Do not eat food After Midnight. Monday NIGHT--- MAY HAVE CLEAR LIQUIDS Tuesday MORNING UNTIL 0830am, THEN NOTHING BY MOUTH   Take these medicines the morning of surgery with A SIP OF WATER:  NONE 1440- per Dr Regan Lemming, make take Gleevec morning of surgery  .  Contacts, dentures or partial plates can not be worn to surgery  Leave suitcase in the car. After surgery it may be brought to your room.  For patients admitted to the hospital, checkout time is 11:00 AM day of  discharge.             SPECIAL INSTRUCTIONS- SEE [facility] PREPARING FOR SURGERY INSTRUCTION SHEET-     DO NOT WEAR JEWELRY, LOTIONS, POWDERS, OR PERFUMES.  WOMEN-- DO NOT SHAVE LEGS OR UNDERARMS FOR 12 HOURS BEFORE SHOWERS. MEN MAY SHAVE FACE.  Patients discharged the day of surgery will not be allowed to drive home. IF going home the day of surgery, you must have a driver and someone to stay with you for the first 24 hours  Name and phone number of your driver:        ADMISSION                                                                Please read over the following fact sheets that you were given: MRSA Information, Incentive Spirometry Sheet, Blood Transfusion Sheet  Information                                                                                   [name]  PST [phone]  [phone]                 FAILURE TO FOLLOW THESE INSTRUCTIONS MAY RESULT IN  CANCELLATION   OF YOUR SURGERY                                                  Patient Signature _____________________________

## 2013-04-28 NOTE — Telephone Encounter (Signed)
Plain Mucinex (NOT D) for thick secretions ;force NON dairy fluids .  Nasacort AQ OTC 1 spray in each nostril twice a day as needed. Use the "crossover" technique into opposite nostril spraying toward opposite ear @ 45 degree angle, not straight up into nostril.  Use a Neti pot daily only  as needed for significant sinus congestion; going from open side to congested side . Zicam Melts or Zinc lozenges as per package label for scratchy throat . Complementary options include  vitamin C 2000 mg daily; & Echinacea for 4-7 days. Report persistent or progressive fever; discolored nasal or chest secretions; or frontal headache or facial  pain.

## 2013-04-28 NOTE — Telephone Encounter (Signed)
Patient states that she has knee replacement sx 1 week from today and currently has a cough and runny nose. She was told her her pre-surgical clearance to check with Dr. Alwyn Ren to see if could be treated. Please advise.

## 2013-04-29 ENCOUNTER — Encounter: Payer: Self-pay | Admitting: Oncology

## 2013-04-29 ENCOUNTER — Ambulatory Visit (INDEPENDENT_AMBULATORY_CARE_PROVIDER_SITE_OTHER): Payer: 59 | Admitting: Emergency Medicine

## 2013-04-29 ENCOUNTER — Other Ambulatory Visit: Payer: Self-pay | Admitting: Orthopedic Surgery

## 2013-04-29 VITALS — BP 118/64 | HR 71 | Temp 98.0°F | Resp 18 | Ht 61.5 in | Wt 154.2 lb

## 2013-04-29 DIAGNOSIS — J069 Acute upper respiratory infection, unspecified: Secondary | ICD-10-CM

## 2013-04-29 DIAGNOSIS — D61818 Other pancytopenia: Secondary | ICD-10-CM

## 2013-04-29 LAB — POCT CBC
Granulocyte percent: 64.6 %G (ref 37–80)
Hemoglobin: 10.1 g/dL — AB (ref 12.2–16.2)
Lymph, poc: 0.8 (ref 0.6–3.4)
MCH, POC: 35.2 pg — AB (ref 27–31.2)
MCHC: 30.5 g/dL — AB (ref 31.8–35.4)
MPV: 7.6 fL (ref 0–99.8)
POC Granulocyte: 2.2 (ref 2–6.9)
POC MID %: 11.4 %M (ref 0–12)
Platelet Count, POC: 94 10*3/uL — AB (ref 142–424)
RBC: 2.87 M/uL — AB (ref 4.04–5.48)
WBC: 3.4 10*3/uL — AB (ref 4.6–10.2)

## 2013-04-29 MED ORDER — HYDROCOD POLST-CHLORPHEN POLST 10-8 MG/5ML PO LQCR: 5.0000 mL | Freq: Every evening | ORAL | Status: AC | PRN

## 2013-04-29 MED ORDER — LEVOFLOXACIN 500 MG PO TABS: 500.0000 mg | ORAL_TABLET | Freq: Every day | ORAL | Status: DC

## 2013-04-29 MED ORDER — GUAIFENESIN ER 600 MG PO TB12: 600.0000 mg | ORAL_TABLET | Freq: Two times a day (BID) | ORAL | Status: DC

## 2013-04-29 NOTE — Progress Notes (Unsigned)
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 Scheduled for knee replacement by Dr Cleophas Dunker on 05/05/13. Routine CBC shows counts decreased from her baseline. Counts safe enough for surgery but I will decrease her gleevec to 200 mg daily. I left message on her cell phone.  No answer at home number. Our pharmacist will get a supply of 100 mg tabs for the holiday weekend for me to dispense to her.

## 2013-04-29 NOTE — Patient Instructions (Signed)
Push fluids.  Use nasal saline spray to help with moisture of mucus membranes.  Call the surgeon on Friday to tell them the status - if you are feeling better I think it will be ok to have the surgery - if you are not feeling better a reschedule might be best.

## 2013-04-29 NOTE — Progress Notes (Signed)
Pre Victorino Dike at [facility], [place] with Dr Thomasena Edis is aware of abnormal CBC,chemistries faxed yesterday

## 2013-04-29 NOTE — Progress Notes (Signed)
LATE ENTRY FOR 04/28/13-  At PST visit patient is coughing- non productive with no fever, no nasal sectetions, "sniffles"  States was with sick grandchildren this past week.  Instructed to follow up with PCP

## 2013-04-29 NOTE — Progress Notes (Signed)
   Subjective:    Patient ID: [name] [name], female    DOB: 01/09/1935, 78 y.o.   MRN: [id number]  HPI Pt presents to clinic with cols symptoms that started yesterday.  She has nasal congestion with clear rhinorrhea and she has a cough with clear sputum.  She is concerned that she has a knee replacement schedule for Tuesday and she wants to make sure is better so she can have the surgery.  OTC meds - none Sick contacts - grandchildren Flu vaccine this fall  Review of Systems  Constitutional: Positive for chills. Negative for fever.  HENT: Positive for congestion, postnasal drip and rhinorrhea (clear).   Respiratory: Positive for cough (clear). Negative for shortness of breath and wheezing.   Gastrointestinal: Negative for nausea, vomiting and diarrhea.  Musculoskeletal: Negative for myalgias.  Neurological: Positive for headaches (mild).       Objective:   Physical Exam  Vitals reviewed. Constitutional: She is oriented to person, place, and time. She appears well-developed and well-nourished.  HENT:  Head: Normocephalic and atraumatic.  Right Ear: External ear normal.  Left Ear: External ear normal.  Eyes: Conjunctivae are normal.  Neck: Normal range of motion.  Cardiovascular: Normal rate, regular rhythm and normal heart sounds.   No murmur heard. Pulmonary/Chest: Effort normal and breath sounds normal. She has no wheezes.  Lymphadenopathy:    She has cervical adenopathy (AC enlarged).  Neurological: She is alert and oriented to person, place, and time.  Skin: Skin is warm and dry.  Psychiatric: She has a normal mood and affect. Her behavior is normal. Judgment and thought content normal.       Assessment & Plan:  URI (upper respiratory infection) - Plan: POCT CBC, guaiFENesin (MUCINEX) 600 MG 12 hr tablet, chlorpheniramine-HYDROcodone (TUSSIONEX PENNKINETIC ER) 10-8 MG/5ML LQCR  Pancytopenia - Plan: levofloxacin (LEVAQUIN) 500 MG tablet  Due to pancytopenia and  immuno-supression from Gleevec and upcoming surgery we will cover with abx for sinus and lungs - we will do symptomatic treatment and depending on her status on Friday that will decide if surgery is a good idea.  I think that if she is feeling better by Friday she will be ok for surgery.  If she continues to worsen it may be best to post-pone.  D/w Dr Edrick Kins PA-C 04/29/2013 6:29 PM

## 2013-04-29 NOTE — Telephone Encounter (Signed)
Spoke with the pt informed her of Dr. Frederik Pear recommendation below.  Pt understood and agreed.  Pt asked if we had any appt's today and informed the pt that we did not.  Pt stated that she may need to be seen because she is to have surgery in 6 days and if this is not cleared up she will not be able to have surgery.  Informed the pt she could go to Urgent Care, and she stated that her other doctor told her that as well, so she will go.  Informed her to go to [facility] or [facility].  Pt agreed.//AB/CMA

## 2013-04-30 HISTORY — PX: CATARACT EXTRACTION, BILATERAL: SHX1313

## 2013-05-01 ENCOUNTER — Encounter: Payer: Self-pay | Admitting: Oncology

## 2013-05-01 ENCOUNTER — Encounter (HOSPITAL_COMMUNITY): Payer: Self-pay | Admitting: Anesthesiology

## 2013-05-01 ENCOUNTER — Telehealth: Payer: Self-pay | Admitting: *Deleted

## 2013-05-01 ENCOUNTER — Other Ambulatory Visit: Payer: Self-pay | Admitting: *Deleted

## 2013-05-01 MED ORDER — MAGIC MOUTHWASH: 5.0000 mL | Freq: Three times a day (TID) | ORAL | Status: DC | PRN

## 2013-05-01 NOTE — Telephone Encounter (Signed)
Magic Mouthwash 90 cc  5 cc gargled well & swallowed

## 2013-05-01 NOTE — Addendum Note (Signed)
Addended by: Peggyann Shoals on: 05/01/2013 11:17 AM   Modules accepted: Orders

## 2013-05-01 NOTE — Progress Notes (Signed)
I reached patient at home and reviewed lab.  A second CBC was done through an urgent care center on 04/29/13 and shows improvement in the white count to her baseline but persistent decrease in hemoglobin and platelet count. She has not started any new meds.  I advised her to decrease her gleevec to 200 mg daily until further instruction. She will have a repeat CBC on 1/5.  If platelets the same or higher, she is clear for surgery on Tuesday 05/05/13.

## 2013-05-01 NOTE — Telephone Encounter (Signed)
Patient called and stated she was seen at [facility] on 04/29/13 and was given Levaquin. She states that know her tongue is white and she would like to have magic mouthwash. Please advise. Pt has surgery scheduled for Tuesday.

## 2013-05-04 ENCOUNTER — Encounter (HOSPITAL_COMMUNITY)
Admission: RE | Admit: 2013-05-04 | Discharge: 2013-05-04 | Disposition: A | Discharge status: Home / Self Care | Payer: Medicare Other | Source: Ambulatory Visit | Attending: Specialist | Admitting: Specialist

## 2013-05-04 DIAGNOSIS — Z01812 Encounter for preprocedural laboratory examination: Secondary | ICD-10-CM | POA: Insufficient documentation

## 2013-05-04 DIAGNOSIS — Z01818 Encounter for other preprocedural examination: Secondary | ICD-10-CM | POA: Insufficient documentation

## 2013-05-04 LAB — CBC
HCT: 30.3 % — ABNORMAL LOW (ref 36.0–46.0)
Hemoglobin: 10.4 g/dL — ABNORMAL LOW (ref 12.0–15.0)
MCH: 37.3 pg — AB (ref 26.0–34.0)
MCHC: 34.3 g/dL (ref 30.0–36.0)
MCV: 108.6 fL — AB (ref 78.0–100.0)
PLATELETS: 111 10*3/uL — AB (ref 150–400)
RBC: 2.79 MIL/uL — ABNORMAL LOW (ref 3.87–5.11)
RDW: 14.3 % (ref 11.5–15.5)
WBC: 3.2 10*3/uL — ABNORMAL LOW (ref 4.0–10.5)

## 2013-05-04 NOTE — Pre-Procedure Instructions (Signed)
05-04-12 1220- CBC done today viewable in Epic faxed to [phone] module Dr. Theda Sers office.W. Floy Sabina

## 2013-05-04 NOTE — Progress Notes (Signed)
05-04-12 1220 CBC results done today are viewable in Epic.

## 2013-05-05 ENCOUNTER — Inpatient Hospital Stay (HOSPITAL_COMMUNITY): Admission: RE | Admit: 2013-05-05 | Payer: Medicare Other | Source: Ambulatory Visit | Admitting: Specialist

## 2013-05-05 ENCOUNTER — Inpatient Hospital Stay: Admit: 2013-05-05 | Payer: Self-pay | Admitting: Specialist

## 2013-05-05 ENCOUNTER — Other Ambulatory Visit: Payer: Self-pay | Admitting: Emergency Medicine

## 2013-05-05 ENCOUNTER — Encounter (HOSPITAL_COMMUNITY): Admission: RE | Payer: Self-pay | Source: Ambulatory Visit

## 2013-05-05 DIAGNOSIS — C50919 Malignant neoplasm of unspecified site of unspecified female breast: Secondary | ICD-10-CM

## 2013-05-05 LAB — TYPE AND SCREEN
ABO/RH(D): O POS
Antibody Screen: NEGATIVE

## 2013-05-05 SURGERY — ARTHROPLASTY, KNEE, TOTAL
Anesthesia: Spinal | Site: Knee | Laterality: Right

## 2013-05-05 SURGERY — ARTHROPLASTY, KNEE, TOTAL
Anesthesia: Choice | Site: Knee | Laterality: Right

## 2013-05-12 ENCOUNTER — Ambulatory Visit (HOSPITAL_BASED_OUTPATIENT_CLINIC_OR_DEPARTMENT_OTHER): Payer: Medicare Other | Admitting: Oncology

## 2013-05-12 ENCOUNTER — Encounter: Payer: Self-pay | Admitting: Oncology

## 2013-05-12 ENCOUNTER — Other Ambulatory Visit (HOSPITAL_BASED_OUTPATIENT_CLINIC_OR_DEPARTMENT_OTHER): Payer: Medicare Other

## 2013-05-12 VITALS — BP 128/76 | HR 98 | Temp 98.2°F | Resp 20 | Ht 61.5 in | Wt 143.5 lb

## 2013-05-12 DIAGNOSIS — C9211 Chronic myeloid leukemia, BCR/ABL-positive, in remission: Secondary | ICD-10-CM

## 2013-05-12 DIAGNOSIS — C50919 Malignant neoplasm of unspecified site of unspecified female breast: Secondary | ICD-10-CM

## 2013-05-12 DIAGNOSIS — C9210 Chronic myeloid leukemia, BCR/ABL-positive, not having achieved remission: Secondary | ICD-10-CM

## 2013-05-12 LAB — CBC WITH DIFFERENTIAL/PLATELET
BASO%: 0.6 % (ref 0.0–2.0)
Basophils Absolute: 0 10*3/uL (ref 0.0–0.1)
EOS%: 0.4 % (ref 0.0–7.0)
Eosinophils Absolute: 0 10*3/uL (ref 0.0–0.5)
HEMATOCRIT: 30.4 % — AB (ref 34.8–46.6)
HGB: 10.3 g/dL — ABNORMAL LOW (ref 11.6–15.9)
LYMPH%: 20.3 % (ref 14.0–49.7)
MCH: 38.3 pg — AB (ref 25.1–34.0)
MCHC: 33.8 g/dL (ref 31.5–36.0)
MCV: 113.2 fL — ABNORMAL HIGH (ref 79.5–101.0)
MONO#: 0.5 10*3/uL (ref 0.1–0.9)
MONO%: 14.3 % — ABNORMAL HIGH (ref 0.0–14.0)
NEUT#: 2.4 10*3/uL (ref 1.5–6.5)
NEUT%: 64.4 % (ref 38.4–76.8)
PLATELETS: 167 10*3/uL (ref 145–400)
RBC: 2.69 10*6/uL — ABNORMAL LOW (ref 3.70–5.45)
RDW: 13.8 % (ref 11.2–14.5)
WBC: 3.8 10*3/uL — ABNORMAL LOW (ref 3.9–10.3)
lymph#: 0.8 10*3/uL — ABNORMAL LOW (ref 0.9–3.3)

## 2013-05-17 ENCOUNTER — Encounter: Payer: Self-pay | Admitting: Oncology

## 2013-05-17 NOTE — Progress Notes (Signed)
OFFICE PROGRESS NOTE   Unice Cobble, MD [phone] [facility] [place] [address]  DIAGNOSIS: 79 year old female with CML originally diagnosed in 2004  PRIOR THERAPY: Patient has been on [place] 300 mg daily since diagnosisPatient recently dropped her platelet count and weakly that was changed to 200 mg daily.  CURRENT THERAPY: Gleevec 200 mg daily  INTERVAL HISTORY: AALANI AIKENS 79 y.o. female returns for followup visit today to discuss her upcoming surgery and her blood count. Most recently patient had a CBC performed during an acute illness and she was noted to have a platelet count of 94,000. Subsequently was 111,000 today it is now normal. She no longer is suffering from URI. Patient is on Gleevec 200 mg daily. I have recommended that we continue this for now until after her upcoming knee surgery. She overall feels well she has no bleeding problems no infections now. She denies any headaches double vision blurring of vision fevers chills or night sweats. Remainder of the 10 point review of systems is unremarkable.  MEDICAL HISTORY: Past Medical History  Diagnosis Date  . Breast cancer     S/P lumpectomy , radiation, & Tamoxifen  . CML (chronic myeloid leukemia)     Dr Chancy Milroy  . Arthritis     DJD  . Cough 04/27/13    x 24 hours- no fever    ALLERGIES:  is allergic to penicillins.  MEDICATIONS:  Current Outpatient Prescriptions  Medication Sig Dispense Refill  . aspirin 81 MG tablet Take 81 mg by mouth daily.      Marland Kitchen CALCIUM-VITAMIN D PO Take 1 tablet by mouth 2 (two) times daily.       . diclofenac sodium (VOLTAREN) 1 % GEL Apply 1 g topically 4 (four) times daily. As needed      . folic acid (FOLVITE) 1 MG tablet Take 1 mg by mouth daily.      Marland Kitchen imatinib (GLEEVEC) 100 MG tablet Take 200 mg by mouth daily with breakfast. Take with meals and large glass of water.Caution:Chemotherapy      . loperamide (IMODIUM A-D) 2 MG tablet Take 1 mg by mouth  daily.       No current facility-administered medications for this visit.    SURGICAL HISTORY:  Past Surgical History  Procedure Laterality Date  . Knee surgery      Arthroscopy, right knee   . Breast surgery      Right lumpectomy, s/p radiation 1993 and oral  Tamoxifen x 5 years ([id number])   . Appendectomy    . Abdominal hysterectomy      no BSO, for Dysplasia   . Colonoscopy  03/2003    diverticulosis, Dr.Kaplan  . Sigmoid colon resection  2010     Dr Arlyce Dice, [place]  . Ileostomy reversal      3 mos after above  . Bilateral salpingoophorectomy  2010    with bowel resection  . Gastrocslide      left leg; Dr Beola Cord    REVIEW OF SYSTEMS:  Pertinent items are noted in HPI.   PHYSICAL EXAMINATION: General appearance: alert, cooperative and appears stated age Neck: no adenopathy, no carotid bruit, no JVD, supple, symmetrical, trachea midline and thyroid not enlarged, symmetric, no tenderness/mass/nodules Lymph nodes: Cervical, supraclavicular, and axillary nodes normal. Resp: clear to auscultation bilaterally and normal percussion bilaterally Back: symmetric, no curvature. ROM normal. No CVA tenderness. Cardio: regular rate and rhythm, S1, S2 normal, no murmur, click, rub or gallop GI:  soft, non-tender; bowel sounds normal; no masses,  no organomegaly Extremities: extremities normal, atraumatic, no cyanosis or edema Neurologic: Alert and oriented X 3, normal strength and tone. Normal symmetric reflexes. Normal coordination and gait  ECOG PERFORMANCE STATUS: 0 - Asymptomatic  Blood pressure 128/76, pulse 98, temperature 98.2 F (36.8 C), temperature source Oral, resp. rate 20, height 5' 1.5" (1.562 m), weight 143 lb 8 oz (65.091 kg).  LABORATORY DATA: Lab Results  Component Value Date   WBC 3.8* 05/12/2013   HGB 10.3* 05/12/2013   HCT 30.4* 05/12/2013   MCV 113.2* 05/12/2013   PLT 167 05/12/2013      Chemistry      Component Value Date/Time   NA 142 04/28/2013 1440    NA 141 04/07/2013 0837   NA 135 09/13/2009 0924   K 4.5 04/28/2013 1440   K 4.4 04/07/2013 0837   K 4.3 09/13/2009 0924   CL 104 04/28/2013 1440   CL 105 09/04/2012 1006   CL 98 09/13/2009 0924   CO2 29 04/28/2013 1440   CO2 27 04/07/2013 0837   CO2 30 09/13/2009 0924   BUN 24* 04/28/2013 1440   BUN 22.4 04/07/2013 0837   BUN 23* 09/13/2009 0924   CREATININE 0.96 04/28/2013 1440   CREATININE 1.0 04/07/2013 0837   CREATININE 1.0 09/13/2009 0924      Component Value Date/Time   CALCIUM 9.0 04/28/2013 1440   CALCIUM 9.4 04/07/2013 0837   CALCIUM 9.3 09/13/2009 0924   ALKPHOS 58 04/28/2013 1440   ALKPHOS 51 04/07/2013 0837   ALKPHOS 72 09/13/2009 0924   AST 37 04/28/2013 1440   AST 34 04/07/2013 0837   AST 32 09/13/2009 0924   ALT 18 04/28/2013 1440   ALT 22 04/07/2013 0837   ALT 24 09/13/2009 0924   BILITOT 0.3 04/28/2013 1440   BILITOT 0.63 04/07/2013 0837   BILITOT 0.80 09/13/2009 0924       RADIOGRAPHIC STUDIES:  No results found.  ASSESSMENT: 79 year old female with CML in remission currently on Gleevec. Overall she's doing well she is tolerating the Robinson quite well. We will send her blood for peripheral for BCR/ABL. She also has had a CBC her counts low pretty stable.  Patient will proceed with her scheduled upcoming orthopedic surgery. She will continue Gleevec 200 mg daily.   PLAN: I will plan on seeing the patient back in March and April after she has had her surgery. At that time we will plan on increasing the dose of Gleevec back to 300 mg daily.  All questions were answered. The patient knows to call the clinic with any problems, questions or concerns. We can certainly see the patient much sooner if necessary.  I spent 25 minutes counseling the patient face to face. The total time spent in the appointment was 30 minutes.    Marcy Panning, MD Medical/Oncology [facility] [phone] (beeper) [phone] (Office)  05/17/2013, 5:52 PM

## 2013-05-18 ENCOUNTER — Telehealth: Payer: Self-pay | Admitting: Oncology

## 2013-05-18 NOTE — Telephone Encounter (Signed)
s/w pt re appt for 3/25

## 2013-05-27 ENCOUNTER — Other Ambulatory Visit: Payer: Self-pay | Admitting: Orthopedic Surgery

## 2013-06-01 NOTE — H&P (Signed)
TOTAL KNEE ADMISSION H&P  Patient is being admitted for right total knee arthroplasty.  Subjective:  Chief Complaint:right knee pain.  HPI: [name] [name], 79 y.o. female, has a history of pain and functional disability in the right knee due to arthritis and has failed non-surgical conservative treatments for greater than 12 weeks to includeNSAID's and/or analgesics, corticosteriod injections, viscosupplementation injections, flexibility and strengthening excercises, weight reduction as appropriate and activity modification.  Onset of symptoms was gradual, starting 3 years ago with gradually worsening course since that time. The patient noted no past surgery on the right knee(s).  Patient currently rates pain in the right knee(s) at 5 out of 10 with activity. Patient has worsening of pain with activity and weight bearing, pain with passive range of motion and joint swelling.  Patient has evidence of subchondral cysts, subchondral sclerosis, joint subluxation and joint space narrowing by imaging studies. This patient has had Osteoarthritis. There is no active infection.  Patient Active Problem List   Diagnosis Date Noted  . 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 Other abnormal glucose 07/30/2012  . Breast cancer 07/18/2012  . Osteopenia 09/15/2011  . Diarrhea 08/29/2011  . Family history of malignant neoplasm of gastrointestinal tract 08/29/2011  . HYPERLIPIDEMIA 04/14/2009  . ANEMIA 04/14/2009  . VISUAL IMPAIRMENT, TRANSIENT 04/14/2009  . RECTOVAGINAL FISTULA 11/10/2008  . DIVERTICULOSIS OF COLON 04/06/2008  . LEUKEMIA, MYELOID, CHRONIC 02/18/2007  . ROSACEA 02/18/2007   Past Medical History  Diagnosis Date  . Breast cancer     S/P lumpectomy , radiation, & Tamoxifen  . CML (chronic myeloid leukemia)     Dr Chancy Milroy  . Arthritis     DJD  . Cough 04/27/13    x 24 hours- no fever    Past Surgical History  Procedure Laterality Date  . Knee surgery      Arthroscopy, right knee   . Breast  surgery      Right lumpectomy, s/p radiation 1993 and oral  Tamoxifen x 5 years ([id number])   . Appendectomy    . Abdominal hysterectomy      no BSO, for Dysplasia   . Colonoscopy  03/2003    diverticulosis, Dr.Kaplan  . Sigmoid colon resection  2010     Dr Arlyce Dice, [place]  . Ileostomy reversal      3 mos after above  . Bilateral salpingoophorectomy  2010    with bowel resection  . Gastrocslide      left leg; Dr Beola Cord    No prescriptions prior to admission   Allergies  Allergen Reactions  . Penicillins     Hives Because of a history of documented adverse serious drug reaction;Medi Alert bracelet  is recommended    History  Substance Use Topics  . Smoking status: Former Smoker    Types: Cigarettes    Quit date: 04/30/1962  . Smokeless tobacco: Never Used     Comment: Age 5   . Alcohol Use: 8.4 oz/week    14 Glasses of wine per week     Comment: 2 glasses wine in evening    Family History  Problem Relation Age of Onset  . Colon cancer Mother 56  . Uterine cancer Sister   . Diabetes Cousin   . Cancer Father     Lip cancer  . Breast cancer Paternal Aunt   . Heart attack Paternal Uncle 64  . Stroke Neg Hx      Review of Systems  Constitutional: Negative.   HENT: Negative.  Eyes: Negative.   Respiratory: Negative.   Cardiovascular: Negative.   Gastrointestinal: Negative.   Genitourinary: Negative.   Musculoskeletal: Positive for joint pain.  Skin: Negative.   Neurological: Negative.   Endo/Heme/Allergies: Negative.   Psychiatric/Behavioral: Negative.     Objective:  Physical Exam  Constitutional: She is oriented to person, place, and time. She appears well-developed.  HENT:  Head: Normocephalic.  Eyes: EOM are normal.  Neck: Normal range of motion.  Cardiovascular: Normal rate, regular rhythm, normal heart sounds and intact distal pulses.   Respiratory: Effort normal and breath sounds normal.  GI: Soft. Bowel sounds are normal.  Genitourinary:   Deferred  Musculoskeletal: She exhibits edema and tenderness.  Right knee  Neurological: She is alert and oriented to person, place, and time.  Skin: Skin is warm and dry.  Psychiatric: Her behavior is normal.    Vital signs in last 24 hours:    Labs:   Estimated body mass index is 26.68 kg/(m^2) as calculated from the following:   Height as of 05/12/13: 5' 1.5" (1.562 m).   Weight as of 05/12/13: 65.091 kg (143 lb 8 oz).   Imaging Review Plain radiographs demonstrate severe degenerative joint disease of the right knee(s). The overall alignment ismild varus. The bone quality appears to be good for age and reported activity level.  Assessment/Plan:  End stage arthritis, right knee   The patient history, physical examination, clinical judgment of the provider and imaging studies are consistent with end stage degenerative joint disease of the right knee(s) and total knee arthroplasty is deemed medically necessary. The treatment options including medical management, injection therapy arthroscopy and arthroplasty were discussed at length. The risks and benefits of total knee arthroplasty were presented and reviewed. The risks due to aseptic loosening, infection, stiffness, patella tracking problems, thromboembolic complications and other imponderables were discussed. The patient acknowledged the explanation, agreed to proceed with the plan and consent was signed. Patient is being admitted for inpatient treatment for surgery, pain control, PT, OT, prophylactic antibiotics, VTE prophylaxis, progressive ambulation and ADL's and discharge planning. The patient is planning to be discharged home with home health services

## 2013-06-03 ENCOUNTER — Encounter (HOSPITAL_COMMUNITY): Payer: Self-pay | Admitting: Pharmacy Technician

## 2013-06-05 ENCOUNTER — Encounter (HOSPITAL_COMMUNITY)
Admission: RE | Admit: 2013-06-05 | Discharge: 2013-06-05 | Disposition: A | Discharge status: Home / Self Care | Payer: Medicare Other | Source: Ambulatory Visit | Attending: Specialist | Admitting: Specialist

## 2013-06-05 ENCOUNTER — Encounter (HOSPITAL_COMMUNITY): Payer: Self-pay

## 2013-06-05 DIAGNOSIS — Z01812 Encounter for preprocedural laboratory examination: Secondary | ICD-10-CM | POA: Insufficient documentation

## 2013-06-05 LAB — SURGICAL PCR SCREEN
MRSA, PCR: NEGATIVE
Staphylococcus aureus: NEGATIVE

## 2013-06-05 LAB — URINALYSIS, ROUTINE W REFLEX MICROSCOPIC
Bilirubin Urine: NEGATIVE
Glucose, UA: NEGATIVE mg/dL
Hgb urine dipstick: NEGATIVE
KETONES UR: NEGATIVE mg/dL
Leukocytes, UA: NEGATIVE
NITRITE: NEGATIVE
PH: 5.5 (ref 5.0–8.0)
Protein, ur: NEGATIVE mg/dL
SPECIFIC GRAVITY, URINE: 1.027 (ref 1.005–1.030)
Urobilinogen, UA: 0.2 mg/dL (ref 0.0–1.0)

## 2013-06-05 LAB — CBC
HCT: 30.7 % — ABNORMAL LOW (ref 36.0–46.0)
Hemoglobin: 10.6 g/dL — ABNORMAL LOW (ref 12.0–15.0)
MCH: 37.7 pg — ABNORMAL HIGH (ref 26.0–34.0)
MCHC: 34.5 g/dL (ref 30.0–36.0)
MCV: 109.3 fL — ABNORMAL HIGH (ref 78.0–100.0)
Platelets: 127 10*3/uL — ABNORMAL LOW (ref 150–400)
RBC: 2.81 MIL/uL — ABNORMAL LOW (ref 3.87–5.11)
RDW: 14.7 % (ref 11.5–15.5)
WBC: 4 10*3/uL (ref 4.0–10.5)

## 2013-06-05 LAB — BASIC METABOLIC PANEL
BUN: 24 mg/dL — ABNORMAL HIGH (ref 6–23)
CO2: 27 mEq/L (ref 19–32)
Calcium: 9.3 mg/dL (ref 8.4–10.5)
Chloride: 102 mEq/L (ref 96–112)
Creatinine, Ser: 0.99 mg/dL (ref 0.50–1.10)
GFR, EST AFRICAN AMERICAN: 63 mL/min — AB (ref >90)
GFR, EST NON AFRICAN AMERICAN: 54 mL/min — AB (ref >90)
Glucose, Bld: 89 mg/dL (ref 70–99)
POTASSIUM: 4.3 meq/L (ref 3.7–5.3)
Sodium: 141 mEq/L (ref 137–147)

## 2013-06-05 LAB — APTT: APTT: 28 s (ref 24–37)

## 2013-06-05 LAB — PROTIME-INR
INR: 1.04 (ref 0.00–1.49)
Prothrombin Time: 13.4 seconds (ref 11.6–15.2)

## 2013-06-05 NOTE — Patient Instructions (Addendum)
[place]  06/05/2013   Your procedure is scheduled on: 06/11/13  Report to [place] at 12:30 PM.  Call this number if you have problems the morning of surgery 336-: [phone]   Remember:   Do not eat food After Midnight, clear liquids from midnight until 9:00am on 06/11/13 then nothing.    Do not wear jewelry, make-up or nail polish.  Do not wear lotions, powders, or perfumes. You may wear deodorant.  Do not shave 48 hours prior to surgery. Men may shave face and neck.  Do not bring valuables to the hospital.  Contacts, dentures or bridgework may not be worn into surgery.  Leave suitcase in the car. After surgery it may be brought to your room.  For patients admitted to the hospital, checkout time is 11:00 AM the day of discharge.   Please read over the following fact sheets that you were given: MRSA Information, incentive spirometry fact sheet, blood fact sheet Paulette Blanch, RN  pre op nurse call if needed [phone]    FAILURE TO [place]   Patient Signature: ___________________________________________

## 2013-06-05 NOTE — Progress Notes (Signed)
Chest x-ray 04/28/13 on EPIC, EKG 02/27/13 on EPIC

## 2013-06-11 ENCOUNTER — Inpatient Hospital Stay (HOSPITAL_COMMUNITY)
Admission: RE | Admit: 2013-06-11 | Discharge: 2013-06-14 | DRG: 470 | Disposition: A | Discharge status: Home Health | Payer: Medicare Other | Source: Ambulatory Visit | Attending: Specialist | Admitting: Specialist

## 2013-06-11 ENCOUNTER — Encounter (HOSPITAL_COMMUNITY)
Admission: RE | Disposition: A | Discharge status: Home Health | Payer: Medicare Other | Source: Ambulatory Visit | Attending: Specialist

## 2013-06-11 ENCOUNTER — Inpatient Hospital Stay (HOSPITAL_COMMUNITY): Payer: Medicare Other | Admitting: Anesthesiology

## 2013-06-11 ENCOUNTER — Encounter (HOSPITAL_COMMUNITY): Payer: Medicare Other | Admitting: Anesthesiology

## 2013-06-11 ENCOUNTER — Encounter (HOSPITAL_COMMUNITY): Payer: Self-pay | Admitting: *Deleted

## 2013-06-11 DIAGNOSIS — Z923 Personal history of irradiation: Secondary | ICD-10-CM

## 2013-06-11 DIAGNOSIS — Z853 Personal history of malignant neoplasm of breast: Secondary | ICD-10-CM

## 2013-06-11 DIAGNOSIS — Z01812 Encounter for preprocedural laboratory examination: Secondary | ICD-10-CM

## 2013-06-11 DIAGNOSIS — C9211 Chronic myeloid leukemia, BCR/ABL-positive, in remission: Secondary | ICD-10-CM | POA: Diagnosis present

## 2013-06-11 DIAGNOSIS — D696 Thrombocytopenia, unspecified: Secondary | ICD-10-CM | POA: Diagnosis not present

## 2013-06-11 DIAGNOSIS — M899 Disorder of bone, unspecified: Secondary | ICD-10-CM | POA: Diagnosis present

## 2013-06-11 DIAGNOSIS — M949 Disorder of cartilage, unspecified: Secondary | ICD-10-CM

## 2013-06-11 DIAGNOSIS — M1710 Unilateral primary osteoarthritis, unspecified knee: Principal | ICD-10-CM | POA: Diagnosis present

## 2013-06-11 DIAGNOSIS — Z87891 Personal history of nicotine dependence: Secondary | ICD-10-CM

## 2013-06-11 DIAGNOSIS — D62 Acute posthemorrhagic anemia: Secondary | ICD-10-CM | POA: Diagnosis not present

## 2013-06-11 DIAGNOSIS — Z96659 Presence of unspecified artificial knee joint: Secondary | ICD-10-CM

## 2013-06-11 DIAGNOSIS — I959 Hypotension, unspecified: Secondary | ICD-10-CM | POA: Diagnosis not present

## 2013-06-11 HISTORY — PX: TOTAL KNEE ARTHROPLASTY: SHX125

## 2013-06-11 SURGERY — ARTHROPLASTY, KNEE, TOTAL
Anesthesia: Spinal | Site: Knee | Laterality: Right

## 2013-06-11 MED ORDER — LACTATED RINGERS IV SOLN: INTRAVENOUS | Status: DC

## 2013-06-11 MED ORDER — EPHEDRINE SULFATE 50 MG/ML IJ SOLN
INTRAMUSCULAR | Status: DC | PRN
  Administered 2013-06-11: 7.5 mg via INTRAVENOUS

## 2013-06-11 MED ORDER — KETOROLAC TROMETHAMINE 30 MG/ML IJ SOLN
INTRAMUSCULAR | Status: AC
  Filled 2013-06-11: qty 1

## 2013-06-11 MED ORDER — STERILE WATER FOR IRRIGATION IR SOLN
Status: DC | PRN
  Administered 2013-06-11: 1500 mL

## 2013-06-11 MED ORDER — BUPIVACAINE LIPOSOME 1.3 % IJ SUSP
20.0000 mL | Freq: Once | INTRAMUSCULAR | Status: DC
  Filled 2013-06-11: qty 20

## 2013-06-11 MED ORDER — POTASSIUM CHLORIDE IN NACL 20-0.9 MEQ/L-% IV SOLN
INTRAVENOUS | Status: DC
  Administered 2013-06-11 – 2013-06-13 (×2): via INTRAVENOUS
  Filled 2013-06-11 (×4): qty 1000

## 2013-06-11 MED ORDER — OXYCODONE HCL 5 MG PO TABS
5.0000 mg | ORAL_TABLET | ORAL | Status: DC | PRN
  Administered 2013-06-11 (×2): 5 mg via ORAL
  Administered 2013-06-12 (×2): 10 mg via ORAL
  Administered 2013-06-12: 5 mg via ORAL
  Filled 2013-06-11: qty 2
  Filled 2013-06-11 (×3): qty 1
  Filled 2013-06-11: qty 2

## 2013-06-11 MED ORDER — SENNA 8.6 MG PO TABS
1.0000 | ORAL_TABLET | Freq: Two times a day (BID) | ORAL | Status: DC
  Administered 2013-06-12 – 2013-06-14 (×3): 8.6 mg via ORAL

## 2013-06-11 MED ORDER — PHENOL 1.4 % MT LIQD: 1.0000 | OROMUCOSAL | Status: DC | PRN

## 2013-06-11 MED ORDER — BUPIVACAINE LIPOSOME 1.3 % IJ SUSP
INTRAMUSCULAR | Status: DC | PRN
  Administered 2013-06-11: 20 mL

## 2013-06-11 MED ORDER — METOCLOPRAMIDE HCL 10 MG PO TABS: 5.0000 mg | ORAL_TABLET | Freq: Three times a day (TID) | ORAL | Status: DC | PRN

## 2013-06-11 MED ORDER — PROPOFOL INFUSION 10 MG/ML OPTIME
INTRAVENOUS | Status: DC | PRN
  Administered 2013-06-11: 120 ug/kg/min via INTRAVENOUS

## 2013-06-11 MED ORDER — LACTATED RINGERS IV SOLN
INTRAVENOUS | Status: DC | PRN
  Administered 2013-06-11 (×2): via INTRAVENOUS

## 2013-06-11 MED ORDER — ONDANSETRON HCL 4 MG PO TABS: 4.0000 mg | ORAL_TABLET | Freq: Four times a day (QID) | ORAL | Status: DC | PRN

## 2013-06-11 MED ORDER — ONDANSETRON HCL 4 MG/2ML IJ SOLN
4.0000 mg | Freq: Four times a day (QID) | INTRAMUSCULAR | Status: DC | PRN
  Administered 2013-06-12: 4 mg via INTRAVENOUS
  Filled 2013-06-11 (×2): qty 2

## 2013-06-11 MED ORDER — HYDROMORPHONE HCL PF 1 MG/ML IJ SOLN
0.5000 mg | INTRAMUSCULAR | Status: DC | PRN
  Administered 2013-06-12: 1 mg via INTRAVENOUS
  Filled 2013-06-11: qty 1

## 2013-06-11 MED ORDER — VANCOMYCIN HCL IN DEXTROSE 1-5 GM/200ML-% IV SOLN
1000.0000 mg | INTRAVENOUS | Status: AC
  Administered 2013-06-11: 1000 mg via INTRAVENOUS

## 2013-06-11 MED ORDER — CHLORHEXIDINE GLUCONATE 4 % EX LIQD: 60.0000 mL | Freq: Once | CUTANEOUS | Status: DC

## 2013-06-11 MED ORDER — MENTHOL 3 MG MT LOZG
1.0000 | LOZENGE | OROMUCOSAL | Status: DC | PRN
  Filled 2013-06-11: qty 9

## 2013-06-11 MED ORDER — DIPHENHYDRAMINE HCL 12.5 MG/5ML PO ELIX: 12.5000 mg | ORAL_SOLUTION | ORAL | Status: DC | PRN

## 2013-06-11 MED ORDER — METOCLOPRAMIDE HCL 5 MG/ML IJ SOLN: 5.0000 mg | Freq: Three times a day (TID) | INTRAMUSCULAR | Status: DC | PRN

## 2013-06-11 MED ORDER — FERROUS SULFATE 325 (65 FE) MG PO TABS
325.0000 mg | ORAL_TABLET | Freq: Three times a day (TID) | ORAL | Status: DC
  Administered 2013-06-12 – 2013-06-14 (×5): 325 mg via ORAL
  Filled 2013-06-11 (×10): qty 1

## 2013-06-11 MED ORDER — LIDOCAINE HCL 1 % IJ SOLN
INTRAMUSCULAR | Status: AC
  Filled 2013-06-11: qty 20

## 2013-06-11 MED ORDER — BACLOFEN 10 MG PO TABS
10.0000 mg | ORAL_TABLET | Freq: Three times a day (TID) | ORAL | Status: DC | PRN
  Administered 2013-06-11: 10 mg via ORAL
  Filled 2013-06-11: qty 1

## 2013-06-11 MED ORDER — ALUM & MAG HYDROXIDE-SIMETH 200-200-20 MG/5ML PO SUSP: 30.0000 mL | ORAL | Status: DC | PRN

## 2013-06-11 MED ORDER — MIDAZOLAM HCL 2 MG/2ML IJ SOLN
INTRAMUSCULAR | Status: AC
  Filled 2013-06-11: qty 2

## 2013-06-11 MED ORDER — ENOXAPARIN SODIUM 30 MG/0.3ML ~~LOC~~ SOLN
30.0000 mg | Freq: Two times a day (BID) | SUBCUTANEOUS | Status: DC
  Administered 2013-06-12 – 2013-06-14 (×5): 30 mg via SUBCUTANEOUS
  Filled 2013-06-11 (×7): qty 0.3

## 2013-06-11 MED ORDER — KETOROLAC TROMETHAMINE 30 MG/ML IJ SOLN
INTRAMUSCULAR | Status: DC | PRN
  Administered 2013-06-11: 30 mg via INTRAMUSCULAR

## 2013-06-11 MED ORDER — BUPIVACAINE-EPINEPHRINE PF 0.25-1:200000 % IJ SOLN
INTRAMUSCULAR | Status: AC
  Filled 2013-06-11: qty 30

## 2013-06-11 MED ORDER — SODIUM CHLORIDE 0.9 % IR SOLN
Status: DC | PRN
  Administered 2013-06-11: 1000 mL

## 2013-06-11 MED ORDER — FENTANYL CITRATE 0.05 MG/ML IJ SOLN
INTRAMUSCULAR | Status: DC | PRN
  Administered 2013-06-11: 50 ug via INTRAVENOUS

## 2013-06-11 MED ORDER — FENTANYL CITRATE 0.05 MG/ML IJ SOLN
INTRAMUSCULAR | Status: AC
  Filled 2013-06-11: qty 2

## 2013-06-11 MED ORDER — ACETAMINOPHEN 325 MG PO TABS: 650.0000 mg | ORAL_TABLET | Freq: Four times a day (QID) | ORAL | Status: DC | PRN

## 2013-06-11 MED ORDER — SODIUM CHLORIDE 0.9 % IJ SOLN
INTRAMUSCULAR | Status: AC
  Filled 2013-06-11: qty 50

## 2013-06-11 MED ORDER — PROPOFOL 10 MG/ML IV BOLUS
INTRAVENOUS | Status: AC
  Filled 2013-06-11: qty 20

## 2013-06-11 MED ORDER — VANCOMYCIN HCL IN DEXTROSE 1-5 GM/200ML-% IV SOLN
1000.0000 mg | Freq: Two times a day (BID) | INTRAVENOUS | Status: AC
  Administered 2013-06-12: 1000 mg via INTRAVENOUS
  Filled 2013-06-11: qty 200

## 2013-06-11 MED ORDER — PROPOFOL 10 MG/ML IV BOLUS
INTRAVENOUS | Status: DC | PRN
  Administered 2013-06-11: 30 mg via INTRAVENOUS

## 2013-06-11 MED ORDER — SODIUM CHLORIDE 0.9 % IV SOLN: INTRAVENOUS | Status: DC

## 2013-06-11 MED ORDER — IMATINIB MESYLATE 100 MG PO TABS
200.0000 mg | ORAL_TABLET | Freq: Every day | ORAL | Status: DC
  Administered 2013-06-12 – 2013-06-14 (×3): 200 mg via ORAL
  Filled 2013-06-11 (×5): qty 2

## 2013-06-11 MED ORDER — POVIDONE-IODINE 7.5 % EX SOLN: Freq: Once | CUTANEOUS | Status: DC

## 2013-06-11 MED ORDER — BUPIVACAINE HCL (PF) 0.75 % IJ SOLN
INTRAMUSCULAR | Status: DC | PRN
  Administered 2013-06-11: 15 mg via INTRATHECAL

## 2013-06-11 MED ORDER — BISACODYL 5 MG PO TBEC: 5.0000 mg | DELAYED_RELEASE_TABLET | Freq: Every day | ORAL | Status: DC | PRN

## 2013-06-11 MED ORDER — VANCOMYCIN HCL IN DEXTROSE 1-5 GM/200ML-% IV SOLN
INTRAVENOUS | Status: AC
  Filled 2013-06-11: qty 200

## 2013-06-11 MED ORDER — 0.9 % SODIUM CHLORIDE (POUR BTL) OPTIME
TOPICAL | Status: DC | PRN
  Administered 2013-06-11: 1000 mL

## 2013-06-11 MED ORDER — ACETAMINOPHEN 650 MG RE SUPP: 650.0000 mg | Freq: Four times a day (QID) | RECTAL | Status: DC | PRN

## 2013-06-11 MED ORDER — MIDAZOLAM HCL 5 MG/5ML IJ SOLN
INTRAMUSCULAR | Status: DC | PRN
  Administered 2013-06-11: 2 mg via INTRAVENOUS

## 2013-06-11 MED ORDER — POLYETHYLENE GLYCOL 3350 17 G PO PACK: 17.0000 g | PACK | Freq: Every day | ORAL | Status: DC | PRN

## 2013-06-11 MED ORDER — NORMAL SALINE FLUSH 0.9 % IV SOLN
INTRAVENOUS | Status: DC | PRN
Start: 2013-06-11 — End: 2013-06-11
  Administered 2013-06-11: 14 mL

## 2013-06-11 MED ORDER — BUPIVACAINE-EPINEPHRINE 0.25% -1:200000 IJ SOLN
INTRAMUSCULAR | Status: DC | PRN
  Administered 2013-06-11: 25 mL

## 2013-06-11 MED ORDER — FLEET ENEMA 7-19 GM/118ML RE ENEM: 1.0000 | ENEMA | Freq: Once | RECTAL | Status: AC | PRN

## 2013-06-11 SURGICAL SUPPLY — 65 items
ADH SKN CLS APL DERMABOND .7 (GAUZE/BANDAGES/DRESSINGS) ×1
BAG SPEC THK2 15X12 ZIP CLS (MISCELLANEOUS) ×2
BAG ZIPLOCK 12X15 (MISCELLANEOUS) ×6 IMPLANT
BANDAGE ELASTIC 4 VELCRO ST LF (GAUZE/BANDAGES/DRESSINGS) ×3 IMPLANT
BANDAGE ELASTIC 6 VELCRO ST LF (GAUZE/BANDAGES/DRESSINGS) ×3 IMPLANT
BANDAGE ESMARK 6X9 LF (GAUZE/BANDAGES/DRESSINGS) ×1 IMPLANT
BLADE SAG 18X100X1.27 (BLADE) ×3 IMPLANT
BLADE SAW SGTL 13.0X1.19X90.0M (BLADE) ×3 IMPLANT
BNDG CMPR 9X6 STRL LF SNTH (GAUZE/BANDAGES/DRESSINGS) ×1
BNDG ESMARK 6X9 LF (GAUZE/BANDAGES/DRESSINGS) ×3
CAPT RP KNEE ×2 IMPLANT
CEMENT HV SMART SET (Cement) ×4 IMPLANT
CUFF TOURN SGL QUICK 34 (TOURNIQUET CUFF) ×3
CUFF TRNQT CYL 34X4X40X1 (TOURNIQUET CUFF) ×1 IMPLANT
DERMABOND ADVANCED (GAUZE/BANDAGES/DRESSINGS) ×2
DERMABOND ADVANCED .7 DNX12 (GAUZE/BANDAGES/DRESSINGS) ×1 IMPLANT
DRAPE EXTREMITY T 121X128X90 (DRAPE) ×3 IMPLANT
DRAPE LG THREE QUARTER DISP (DRAPES) ×3 IMPLANT
DRAPE POUCH INSTRU U-SHP 10X18 (DRAPES) ×3 IMPLANT
DRAPE U-SHAPE 47X51 STRL (DRAPES) ×3 IMPLANT
DRSG AQUACEL AG ADV 3.5X10 (GAUZE/BANDAGES/DRESSINGS) ×3 IMPLANT
DRSG TEGADERM 4X4.75 (GAUZE/BANDAGES/DRESSINGS) ×3 IMPLANT
DURAPREP 26ML APPLICATOR (WOUND CARE) ×3 IMPLANT
ELECT REM PT RETURN 9FT ADLT (ELECTROSURGICAL) ×3
ELECTRODE REM PT RTRN 9FT ADLT (ELECTROSURGICAL) ×1 IMPLANT
EVACUATOR 1/8 PVC DRAIN (DRAIN) ×3 IMPLANT
FACESHIELD LNG OPTICON STERILE (SAFETY) ×15 IMPLANT
GAUZE SPONGE 2X2 8PLY STRL LF (GAUZE/BANDAGES/DRESSINGS) ×1 IMPLANT
GLOVE BIOGEL PI IND STRL 8 (GLOVE) ×2 IMPLANT
GLOVE BIOGEL PI INDICATOR 8 (GLOVE) ×4
GLOVE SURG ORTHO 8.0 STRL STRW (GLOVE) ×3 IMPLANT
GLOVE SURG ORTHO 9.0 STRL STRW (GLOVE) ×3 IMPLANT
GLOVE SURG SS PI 7.5 STRL IVOR (GLOVE) ×3 IMPLANT
GOWN STRL REUS W/TWL XL LVL3 (GOWN DISPOSABLE) ×6 IMPLANT
HANDPIECE INTERPULSE COAX TIP (DISPOSABLE) ×3
IMMOBILIZER KNEE 20 (SOFTGOODS) ×3 IMPLANT
KIT BASIN OR (CUSTOM PROCEDURE TRAY) ×3 IMPLANT
NDL SAFETY ECLIPSE 18X1.5 (NEEDLE) ×1 IMPLANT
NEEDLE HYPO 18GX1.5 SHARP (NEEDLE) ×3
NS IRRIG 1000ML POUR BTL (IV SOLUTION) ×3 IMPLANT
PACK TOTAL JOINT (CUSTOM PROCEDURE TRAY) ×3 IMPLANT
POSITIONER SURGICAL ARM (MISCELLANEOUS) ×3 IMPLANT
SET HNDPC FAN SPRY TIP SCT (DISPOSABLE) ×1 IMPLANT
SET PAD KNEE POSITIONER (MISCELLANEOUS) ×3 IMPLANT
SPONGE GAUZE 2X2 STER 10/PKG (GAUZE/BANDAGES/DRESSINGS) ×2
SPONGE LAP 18X18 X RAY DECT (DISPOSABLE) IMPLANT
SPONGE SURGIFOAM ABS GEL 100 (HEMOSTASIS) IMPLANT
STOCKINETTE 6  STRL (DRAPES) ×2
STOCKINETTE 6 STRL (DRAPES) ×1 IMPLANT
SUCTION FRAZIER 12FR DISP (SUCTIONS) ×3 IMPLANT
SUT BONE WAX W31G (SUTURE) IMPLANT
SUT MNCRL AB 3-0 PS2 18 (SUTURE) ×3 IMPLANT
SUT VIC AB 1 CT1 27 (SUTURE) ×12
SUT VIC AB 1 CT1 27XBRD ANTBC (SUTURE) ×4 IMPLANT
SUT VIC AB 2-0 CT1 27 (SUTURE) ×6
SUT VIC AB 2-0 CT1 TAPERPNT 27 (SUTURE) ×2 IMPLANT
SUT VLOC 180 0 24IN GS25 (SUTURE) ×3 IMPLANT
SYR 50ML LL SCALE MARK (SYRINGE) ×3 IMPLANT
TAPE STRIPS DRAPE STRL (GAUZE/BANDAGES/DRESSINGS) ×3 IMPLANT
TOWEL OR 17X26 10 PK STRL BLUE (TOWEL DISPOSABLE) ×3 IMPLANT
TOWEL OR NON WOVEN STRL DISP B (DISPOSABLE) IMPLANT
TOWER CARTRIDGE SMART MIX (DISPOSABLE) ×3 IMPLANT
TRAY FOLEY CATH 14FRSI W/METER (CATHETERS) ×3 IMPLANT
WATER STERILE IRR 1500ML POUR (IV SOLUTION) ×6 IMPLANT
WRAP KNEE MAXI GEL POST OP (GAUZE/BANDAGES/DRESSINGS) ×3 IMPLANT

## 2013-06-11 NOTE — Anesthesia Procedure Notes (Signed)
Spinal  Patient location during procedure: OR Start time: 06/11/2013 3:43 PM End time: 06/11/2013 3:50 PM Staffing Anesthesiologist: Rod Mae L Performed by: anesthesiologist  Preanesthetic Checklist Completed: patient identified, site marked, surgical consent, pre-op evaluation, timeout performed, IV checked, risks and benefits discussed and monitors and equipment checked Spinal Block Patient position: sitting Prep: Betadine Patient monitoring: heart rate, continuous pulse ox and blood pressure Approach: right paramedian Location: L2-3 Injection technique: single-shot Needle Needle type: Spinocan  Needle gauge: 22 G Needle length: 9 cm Assessment Sensory level: T6 Additional Notes Expiration date of kit checked and confirmed. Patient tolerated procedure well, without complications.

## 2013-06-11 NOTE — Anesthesia Preprocedure Evaluation (Signed)
Anesthesia Evaluation  Patient identified by MRN, date of birth, ID band Patient awake    Reviewed: Allergy & Precautions, H&P , NPO status , Patient's Chart, lab work & pertinent test results  Airway Mallampati: II TM Distance: >3 FB Neck ROM: full    Dental no notable dental hx. (+) Teeth Intact, Dental Advisory Given   Pulmonary neg pulmonary ROS, former smoker,  breath sounds clear to auscultation  Pulmonary exam normal       Cardiovascular Exercise Tolerance: Good negative cardio ROS  Rhythm:regular Rate:Normal     Neuro/Psych negative neurological ROS  negative psych ROS   GI/Hepatic negative GI ROS, Neg liver ROS,   Endo/Other  negative endocrine ROS  Renal/GU negative Renal ROS  negative genitourinary   Musculoskeletal   Abdominal   Peds  Hematology negative hematology ROS (+)   Anesthesia Other Findings Chronic myeloid leukemia.  Breast Cancer.  Reproductive/Obstetrics negative OB ROS                           Anesthesia Physical Anesthesia Plan  ASA: III  Anesthesia Plan: Spinal   Post-op Pain Management:    Induction:   Airway Management Planned: Simple Face Mask  Additional Equipment:   Intra-op Plan:   Post-operative Plan:   Informed Consent: I have reviewed the patients History and Physical, chart, labs and discussed the procedure including the risks, benefits and alternatives for the proposed anesthesia with the patient or authorized representative who has indicated his/her understanding and acceptance.   Dental Advisory Given  Plan Discussed with: CRNA and Surgeon  Anesthesia Plan Comments:         Anesthesia Quick Evaluation

## 2013-06-11 NOTE — Op Note (Signed)
DATE OF SURGERY:  06/11/2013  TIME: 6:17 PM  PATIENT NAME:  [name] [name]    AGE: 79 y.o.   PRE-OPERATIVE DIAGNOSIS:  right knee osteoarthritis  POST-OPERATIVE DIAGNOSIS:  right knee osteoarthritis  PROCEDURE:  Procedure(s): RIGHT TOTAL KNEE ARTHROPLASTY  SURGEON:  Harlean Regula ANDREW  ASSISTANT:  Bryson Stilwell, PA-C, present and scrubbed throughout the case, critical for assistance with exposure, retraction, instrumentation, and closure.  OPERATIVE IMPLANTS: Depuy PFC Sigma Rotating Platform.  Femur size 3, Tibia size 4, Patella size 35 3-peg oval button, with a 10 mm polyethylene insert.   PREOPERATIVE INDICATIONS:   [name] [name] is a 79 y.o. year old female with end stage bone on bone arthritis of the knee who failed conservative treatment and elected for Total Knee Arthroplasty.   The risks, benefits, and alternatives were discussed at length including but not limited to the risks of infection, bleeding, nerve injury, stiffness, blood clots, the need for revision surgery, cardiopulmonary complications, among others, and they were willing to proceed.  OPERATIVE DESCRIPTION:  The patient was brought to the operative room and placed in a supine position.  Spinal anesthesia was administered.  IV antibiotics were given.  The lower extremity was prepped and draped in the usual sterile fashion.  Time out was performed.  The leg was elevated and exsanguinated and the tourniquet was inflated.  Anterior quadriceps tendon splitting approach was performed.  The patella was retracted and osteophytes were removed.  The anterior horn of the medial and lateral meniscus was removed and cruciate ligaments resected.   The distal femur was opened with the drill and the intramedullary distal femoral cutting jig was utilized, set at 5 degrees resecting 10 mm off the distal femur.  Care was taken to protect the collateral ligaments.  The distal femoral sizing jig was applied, taking  care to avoid notching.  Then the 4-in-1 cutting jig was applied and the anterior and posterior femur was cut, along with the chamfer cuts.    Then the extramedullary tibial cutting jig was utilized making the appropriate cut using the anterior tibial crest as a reference building in appropriate posterior slope.  Care was taken during the cut to protect the medial and collateral ligaments.  The proximal tibia was removed along with the posterior horns of the menisci.   The posterior medial femoral osteophytes and posterior lateral femoral osteophytes were removed.    The flexion gap was then measured and was symmetric with the extension gap, measured at 10.  I completed the distal femoral preparation using the appropriate jig to prepare the box.  The patella was then measured, and cut with the saw.    The proximal tibia sized and prepared accordingly with the reamer and the punch, and then all components were trialed with the trial insert.  The knee was found to have excellent balance and full motion.    The above named components were then cemented into place and all excess cement was removed.  The trial polyethylene component was in place during cementation, and then was exchanged for the real polyethylene component.    The knee was easily taken through a range of motion and the patella tracked well and the knee irrigated copiously and the parapatellar and subcutaneous tissue closed with vicryl, and monocryl with steri strips for the skin.  The arthrotomy was closed at 90 of flexion. The wounds were dressed with sterile gauze and the tourniquet released and the patient was awakened and returned to the PACU  in stable and satisfactory condition.  There were no complications.  Total tourniquet time was 90 minutes.60 cc esperal,marcaine,toradol periosteal injection. V lock arthrotomy closure.

## 2013-06-11 NOTE — Interval H&P Note (Signed)
History and Physical Interval Note:  06/11/2013 3:29 PM  [name] [name]  has presented today for surgery, with the diagnosis of right knee osteoarthritis  The various methods of treatment have been discussed with the patient and family. After consideration of risks, benefits and other options for treatment, the patient has consented to  Procedure(s): RIGHT TOTAL KNEE ARTHROPLASTY (Right) as a surgical intervention .  The patient's history has been reviewed, patient examined, no change in status, stable for surgery.  I have reviewed the patient's chart and labs.  Questions were answered to the patient's satisfaction.     Criag Wicklund ANDREW

## 2013-06-11 NOTE — Anesthesia Postprocedure Evaluation (Signed)
  Anesthesia Post-op Note  Patient: [name] [name]  Procedure(s) Performed: Procedure(s) (LRB): RIGHT TOTAL KNEE ARTHROPLASTY (Right)  Patient Location: PACU  Anesthesia Type: Spinal  Level of Consciousness: awake and alert   Airway and Oxygen Therapy: Patient Spontanous Breathing  Post-op Pain: mild  Post-op Assessment: Post-op Vital signs reviewed, Patient's Cardiovascular Status Stable, Respiratory Function Stable, Patent Airway and No signs of Nausea or vomiting  Last Vitals:  Filed Vitals:   06/11/13 1845  BP: 113/50  Pulse: 67  Temp:   Resp: 16    Post-op Vital Signs: stable   Complications: No apparent anesthesia complications

## 2013-06-11 NOTE — Transfer of Care (Signed)
Immediate Anesthesia Transfer of Care Note  Patient: [name] [name]  Procedure(s) Performed: Procedure(s): RIGHT TOTAL KNEE ARTHROPLASTY (Right)  Patient Location: PACU  Anesthesia Type:Regional and Spinal  Level of Consciousness: awake, sedated and patient cooperative  Airway & Oxygen Therapy: Patient Spontanous Breathing and Patient connected to face mask oxygen  Post-op Assessment: Report given to PACU RN and Post -op Vital signs reviewed and stable  Post vital signs: Reviewed and stable  Complications: No apparent anesthesia complications

## 2013-06-12 ENCOUNTER — Encounter (HOSPITAL_COMMUNITY): Payer: Self-pay | Admitting: Specialist

## 2013-06-12 LAB — BASIC METABOLIC PANEL
BUN: 13 mg/dL (ref 6–23)
CO2: 26 meq/L (ref 19–32)
CREATININE: 0.79 mg/dL (ref 0.50–1.10)
Calcium: 7.9 mg/dL — ABNORMAL LOW (ref 8.4–10.5)
Chloride: 104 mEq/L (ref 96–112)
GFR calc Af Amer: >90 mL/min (ref >90)
GFR calc non Af Amer: 79 mL/min — ABNORMAL LOW (ref >90)
Glucose, Bld: 94 mg/dL (ref 70–99)
Potassium: 3.8 mEq/L (ref 3.7–5.3)
Sodium: 138 mEq/L (ref 137–147)

## 2013-06-12 LAB — CBC
HEMATOCRIT: 21.4 % — AB (ref 36.0–46.0)
Hemoglobin: 7.3 g/dL — ABNORMAL LOW (ref 12.0–15.0)
MCH: 37.6 pg — ABNORMAL HIGH (ref 26.0–34.0)
MCHC: 34.1 g/dL (ref 30.0–36.0)
MCV: 110.3 fL — ABNORMAL HIGH (ref 78.0–100.0)
Platelets: 75 10*3/uL — ABNORMAL LOW (ref 150–400)
RBC: 1.94 MIL/uL — AB (ref 3.87–5.11)
RDW: 15.1 % (ref 11.5–15.5)
WBC: 3.5 10*3/uL — AB (ref 4.0–10.5)

## 2013-06-12 MED ORDER — HYDROCODONE-ACETAMINOPHEN 5-325 MG PO TABS
1.0000 | ORAL_TABLET | ORAL | Status: DC | PRN
  Administered 2013-06-12 – 2013-06-14 (×6): 2 via ORAL
  Filled 2013-06-12 (×6): qty 2

## 2013-06-12 MED ORDER — OXYCODONE-ACETAMINOPHEN 5-325 MG PO TABS: 1.0000 | ORAL_TABLET | ORAL | Status: DC | PRN

## 2013-06-12 MED ORDER — BACLOFEN 10 MG PO TABS: 10.0000 mg | ORAL_TABLET | Freq: Three times a day (TID) | ORAL | Status: DC | PRN

## 2013-06-12 MED ORDER — FERROUS SULFATE 325 (65 FE) MG PO TABS: 325.0000 mg | ORAL_TABLET | Freq: Three times a day (TID) | ORAL | Status: DC

## 2013-06-12 MED ORDER — ASPIRIN EC 325 MG PO TBEC: 325.0000 mg | DELAYED_RELEASE_TABLET | Freq: Two times a day (BID) | ORAL | Status: DC

## 2013-06-12 MED ORDER — SODIUM CHLORIDE 0.9 % IV BOLUS (SEPSIS)
250.0000 mL | Freq: Once | INTRAVENOUS | Status: AC
  Administered 2013-06-12: 250 mL via INTRAVENOUS

## 2013-06-12 MED ORDER — SODIUM CHLORIDE 0.9 % IV BOLUS (SEPSIS)
500.0000 mL | Freq: Once | INTRAVENOUS | Status: AC
  Administered 2013-06-12: 500 mL via INTRAVENOUS

## 2013-06-12 MED FILL — Sodium Chloride Flush IV Soln 0.9%: INTRAVENOUS | Qty: 14 | Status: AC

## 2013-06-12 NOTE — Progress Notes (Signed)
CSW consulted for SNF placement. PN reviewed. PT is recommending HHPT following hospital d/c. RNCM will assist with d/c planning.  Willet Schleifer LCSW [phone] 

## 2013-06-12 NOTE — Progress Notes (Signed)
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 Today: Hgb 7.3,  pltc 75 K.  However, pt is s/p TKA on 2/12 so acute blood loss anemia is expected.    Confirmed with patient's hematology / oncology physician, Dr. Humphrey Rolls, that she is actively following patient's labs during the current hospitalization.  Clayburn Pert, PharmD, BCPS Pager: [phone] 06/12/2013  1:18 PM

## 2013-06-12 NOTE — Care Management Note (Signed)
  Page 2 of 2   06/12/2013     4:08:05 PM   CARE MANAGEMENT NOTE 06/12/2013  Patient:  HAVANA, BALDWIN   Account Number:  [id number]  Date Initiated:  06/12/2013  Documentation initiated by:  Sherrin Daisy  Subjective/Objective Assessment:   dx rt knee osteoarthritis; total knee replacemnt    Reerral rom doctor's office to [place] for hh services. [place] Go rep states she is also arranging needed CPM from [facility].     Action/Plan:   CM spoke with patient. She wishes to return to her home where spouse will be caregiver.  She will need DME.   Anticipated DC Date:  06/14/2013   Anticipated DC Plan:  [place]  In-house referral  Clinical Social Worker      DC Planning Services  CM consult      [facility] Choice  HOME HEALTH  DURABLE MEDICAL EQUIPMENT   Choice offered to / List presented to:  C-1 Patient        [place] arranged  [place]   Status of service:  In process, will continue to follow Medicare Important Message given?   (If response is "NO", the following Medicare IM given date fields will be blank) Date Medicare IM given:   Date Additional Medicare IM given:    Discharge Disposition:    Per UR Regulation:    If discussed at Long Length of Stay Meetings, dates discussed:    Comments:  06/12/2012 Sherrin Daisy BSN RN CCM [phone] [facility] notiied o DME need.

## 2013-06-12 NOTE — Evaluation (Signed)
Physical Therapy Evaluation Patient Details Name: [name] [name] MRN: [id number] DOB: July 06, 1934 Today's Date: 06/12/2013 Time: 1140-1210 PT Time Calculation (min): 30 min  PT Assessment / Plan / Recommendation History of Present Illness  pt was admitted for R TKA  Clinical Impression  Pt s/p R TKR presents with decreased R LE strength/ROM and post op pain limiting functional mobility.  Pt should progress to d/c home with family assist and HHPT follow up    PT Assessment  Patient needs continued PT services    Follow Up Recommendations  Home health PT    Does the patient have the potential to tolerate intense rehabilitation      Barriers to Discharge        Equipment Recommendations  Rolling walker with 5" wheels    Recommendations for Other Services OT consult   Frequency 7X/week    Precautions / Restrictions Precautions Precautions: Knee Required Braces or Orthoses: Knee Immobilizer - Right Knee Immobilizer - Right: Discontinue once straight leg raise with < 10 degree lag Restrictions Weight Bearing Restrictions: No Other Position/Activity Restrictions: WBAT   Pertinent Vitals/Pain 6/10; premed, cold packs provided      Mobility  Bed Mobility Overal bed mobility: Needs Assistance Bed Mobility: Supine to Sit Supine to sit: Min assist;Mod assist General bed mobility comments: cues for sequence and use of LLE to self assist Transfers Overall transfer level: Needs assistance Equipment used: Rolling walker (2 wheeled) Transfers: Sit to/from Stand Sit to Stand: Min assist General transfer comment: cues for UE and LE position Ambulation/Gait Ambulation/Gait assistance: Min assist;Mod assist Ambulation Distance (Feet): 35 Feet Assistive device: Rolling walker (2 wheeled) Gait Pattern/deviations: Step-to pattern;Decreased step length - right;Decreased step length - left;Antalgic;Trunk flexed;Shuffle Gait velocity: decr General Gait Details: cues for posture,  sequence, position from RW and stride length    Exercises Total Joint Exercises Ankle Circles/Pumps: AROM;Both;15 reps;Supine   PT Diagnosis:    PT Problem List: Decreased strength;Decreased range of motion;Decreased activity tolerance;Decreased mobility;Decreased knowledge of use of DME;Pain;Decreased knowledge of precautions PT Treatment Interventions: DME instruction;Gait training;Stair training;Functional mobility training;Therapeutic activities;Therapeutic exercise;Patient/family education     PT Goals(Current goals can be found in the care plan section) Acute Rehab PT Goals Patient Stated Goal: get back to being active PT Goal Formulation: With patient Time For Goal Achievement: 06/17/13 Potential to Achieve Goals: Good  Visit Information  Last PT Received On: 06/12/13 Assistance Needed: +1 History of Present Illness: pt was admitted for R TKA       Prior [place] expects to be discharged to:: Private residence Living Arrangements: Spouse/significant other Available Help at Discharge: Family Type of Home: House Home Access: Stairs to enter Technical brewer of Steps: 1+1 Entrance Stairs-Rails: None Home Layout: One level Home Equipment: None Prior Function Level of Independence: Independent Communication Communication: No difficulties Dominant Hand: Right    Cognition  Cognition Arousal/Alertness: Awake/alert Behavior During Therapy: WFL for tasks assessed/performed Overall Cognitive Status: Within Functional Limits for tasks assessed (a little difficulty processing twice:  likely meds)    Extremity/Trunk Assessment Upper Extremity Assessment Upper Extremity Assessment: Overall WFL for tasks assessed Lower Extremity Assessment Lower Extremity Assessment: RLE deficits/detail RLE Deficits / Details: 2-/5 quads with AAROM at knee -10 - 35 Cervical / Trunk Assessment Cervical / Trunk Assessment: Normal   Balance    End of  Session PT - End of Session Equipment Utilized During Treatment: Gait belt;Right knee immobilizer Activity Tolerance: Patient tolerated treatment well Patient left: in  chair;with call bell/phone within reach Nurse Communication: Mobility status  GP     [name] [name] 06/12/2013, 3:05 PM

## 2013-06-12 NOTE — Progress Notes (Signed)
Subjective: 1 Day Post-Op Procedure(s) (LRB): RIGHT TOTAL KNEE ARTHROPLASTY (Right) Patient reports pain as well controlled. Soreness in right knee. Reports a good night.  Denies any SOB, CP, or palpations. No N/V/F/C. No BM at this time. Does report a appetite.   Objective: Vital signs in last 24 hours: Temp:  [97.5 F (36.4 C)-97.8 F (36.6 C)] 97.6 F (36.4 C) (02/13 0446) Pulse Rate:  [59-95] 67 (02/13 0446) Resp:  [14-18] 16 (02/13 0446) BP: (82-119)/(48-87) 82/51 mmHg (02/13 0640) SpO2:  [97 %-100 %] 100 % (02/13 0446) Weight:  [65.318 kg (144 lb)] 65.318 kg (144 lb) (02/12 2218)  Intake/Output from previous day: 02/12 0701 - 02/13 0700 In: 4131.3 [P.O.:600; I.V.:3181.3; IV Piggyback:350] Out: 1475 [Urine:705; Drains:620; Blood:150] Intake/Output this shift:     Recent Labs  06/12/13 0452  HGB 7.3*    Recent Labs  06/12/13 0452  WBC 3.5*  RBC 1.94*  HCT 21.4*  PLT 75*    Recent Labs  06/12/13 0452  NA 138  K 3.8  CL 104  CO2 26  BUN 13  CREATININE 0.79  GLUCOSE 94  CALCIUM 7.9*   No results found for this basename: LABPT, INR,  in the last 72 hours  Well nourished. Alert and oriented x3. RRR, Lungs clear, BS x4. Abdomen soft and non tender. Right Calf soft and non tender. Right knee dressing C/D/I. No DVT signs. Compartment soft. No signs of infection.  Right LE neurovascular intact.  Drain D/C'ed. Tip was intact. Pt tolerated well. Pressure held until hemostasis was obtained.  Assessment/Plan: 1 Day Post-Op Procedure(s) (LRB): RIGHT TOTAL KNEE ARTHROPLASTY (Right) Up with PT Start CPM today Plan for D/c home Sunday Continue current care.  Hypotension: Monitor VS  Anemia and Thrombocytopenia: Monitor labs Patient is asymptomatic Dr. Humphrey Rolls will be following.   Frieda Arnall L 06/12/2013, 7:29 AM

## 2013-06-12 NOTE — Progress Notes (Signed)
Physical Therapy Treatment Patient Details Name: [name] [name] MRN: [id number] DOB: 1935-03-31 Today's Date: 06/12/2013 Time: 5093-2671 PT Time Calculation (min): 29 min  PT Assessment / Plan / Recommendation  History of Present Illness pt was admitted for R TKA   PT Comments   Pt motivated and cooperative but ltd by nausea with OOB activity  Follow Up Recommendations  Home health PT     Does the patient have the potential to tolerate intense rehabilitation     Barriers to Discharge        Equipment Recommendations  Rolling walker with 5" wheels    Recommendations for Other Services OT consult  Frequency 7X/week   Progress towards PT Goals Progress towards PT goals: Progressing toward goals  Plan Current plan remains appropriate    Precautions / Restrictions Precautions Precautions: Knee Required Braces or Orthoses: Knee Immobilizer - Right Knee Immobilizer - Right: Discontinue once straight leg raise with < 10 degree lag Restrictions Weight Bearing Restrictions: No Other Position/Activity Restrictions: WBAT   Pertinent Vitals/Pain 5-6/10  Premed, ice packs provided, RN aware    Mobility  Bed Mobility Overal bed mobility: Needs Assistance Bed Mobility: Sit to Supine Supine to sit: Min assist;Mod assist Sit to supine: Mod assist General bed mobility comments: cues for sequence and use of LLE to self assist Transfers Overall transfer level: Needs assistance Equipment used: Rolling walker (2 wheeled) Transfers: Sit to/from Stand Sit to Stand: Min assist General transfer comment: cues for UE and LE position Ambulation/Gait Ambulation/Gait assistance: Min assist Ambulation Distance (Feet): 35 Feet (and 4) Assistive device: Rolling walker (2 wheeled) Gait Pattern/deviations: Step-to pattern;Decreased step length - right;Decreased step length - left;Shuffle;Antalgic;Trunk flexed Gait velocity: decr General Gait Details: cues for posture, sequence, position from RW  and stride length    Exercises Total Joint Exercises Ankle Circles/Pumps: AROM;Both;15 reps;Supine Quad Sets: AROM;Both;10 reps;Supine Heel Slides: AAROM;Right;10 reps;Supine Straight Leg Raises: AAROM;Right;10 reps;Supine   PT Diagnosis:    PT Problem List: Decreased strength;Decreased range of motion;Decreased activity tolerance;Decreased mobility;Decreased knowledge of use of DME;Pain;Decreased knowledge of precautions PT Treatment Interventions: DME instruction;Gait training;Stair training;Functional mobility training;Therapeutic activities;Therapeutic exercise;Patient/family education   PT Goals (current goals can now be found in the care plan section) Acute Rehab PT Goals Patient Stated Goal: get back to being active PT Goal Formulation: With patient Time For Goal Achievement: 06/17/13 Potential to Achieve Goals: Good  Visit Information  Last PT Received On: 06/12/13 Assistance Needed: +1 History of Present Illness: pt was admitted for R TKA    Subjective Data  Subjective: I'm still a little nauseous Patient Stated Goal: get back to being active   Cognition  Cognition Arousal/Alertness: Awake/alert Behavior During Therapy: WFL for tasks assessed/performed Overall Cognitive Status: Within Functional Limits for tasks assessed    Balance     End of Session PT - End of Session Equipment Utilized During Treatment: Gait belt;Right knee immobilizer Activity Tolerance: Patient tolerated treatment well;Other (comment) (nausea) Patient left: in bed;with call bell/phone within reach Nurse Communication: Mobility status   GP     Saumya Hukill 06/12/2013, 3:23 PM

## 2013-06-12 NOTE — Progress Notes (Signed)
Utilization review completed.  

## 2013-06-12 NOTE — Evaluation (Signed)
Occupational Therapy Evaluation Patient Details Name: [name] [name] MRN: [id number] DOB: 06-03-1934 Today's Date: 06/12/2013 Time: 1950-9326 OT Time Calculation (min): 34 min  OT Assessment / Plan / Recommendation History of present illness pt was admitted for R TKA   Clinical Impression   Pt was admitted for the above surgery.  She will benefit from skilled OT in the acute setting to complete education for bathroom transfers, DME, and ADLs.  Goals in acute are for overall supervision to min guard A.    OT Assessment  Patient needs continued OT Services    Follow Up Recommendations  No OT follow up    Barriers to Discharge      Equipment Recommendations  3 in 1 bedside comode    Recommendations for Other Services    Frequency  Min 2X/week    Precautions / Restrictions Precautions Precautions: Knee Required Braces or Orthoses: Knee Immobilizer - Right Restrictions Weight Bearing Restrictions: No   Pertinent Vitals/Pain 4/10 R knee.  Repositioned and reapplied ice.  RN aware    ADL  Grooming: Teeth care;Set up Where Assessed - Grooming: Unsupported sitting Upper Body Bathing: Set up Where Assessed - Upper Body Bathing: Unsupported sitting Lower Body Bathing: Moderate assistance Where Assessed - Lower Body Bathing: Supported sit to stand Upper Body Dressing: Minimal assistance (iv) Where Assessed - Upper Body Dressing: Unsupported sitting Lower Body Dressing: Moderate assistance Where Assessed - Lower Body Dressing: Supported sit to Sales promotion account executive and Hygiene: Moderate assistance Where Assessed - Toileting Clothing Manipulation and Hygiene: Sit to stand from 3-in-1 or toilet Equipment Used: Rolling walker Transfers/Ambulation Related to ADLs: sit to stand for adls.  Pt states pain not as well controlled as earlier:  RN came in and aware ADL Comments: Pt with decreased HgB today, 7.3.  Performed adl from chair, sit to stand.  she does not have  a reacher at home and educated about uses for adls.  Husband home and will help as needed.  Will educate about bathroom transfers on next visit.    OT Diagnosis: Generalized weakness  OT Problem List: Decreased strength;Decreased activity tolerance;Decreased knowledge of use of DME or AE;Pain OT Treatment Interventions: Self-care/ADL training;DME and/or AE instruction;Patient/family education   OT Goals(Current goals can be found in the care plan section) Acute Rehab OT Goals Patient Stated Goal: get back to being active OT Goal Formulation: With patient Time For Goal Achievement: 06/19/13 Potential to Achieve Goals: Good ADL Goals Pt Will Transfer to Toilet: with supervision;bedside commode;ambulating Pt Will Perform Toileting - Clothing Manipulation and hygiene: with supervision;sit to/from stand Pt Will Perform Tub/Shower Transfer: with min guard assist;Shower transfer;3 in 1;ambulating Additional ADL Goal #1: pt will verbalize vs demonstrate with supervision use of reacher for LB ADLs  Visit Information  Last OT Received On: 06/12/13 Assistance Needed: +1 History of Present Illness: pt was admitted for R TKA       Prior [place] expects to be discharged to:: Private residence Living Arrangements: Spouse/significant other Home Equipment: None Prior Function Level of Independence: Independent Communication Communication: No difficulties Dominant Hand: Right         Vision/Perception     Cognition  Cognition Arousal/Alertness: Awake/alert Behavior During Therapy: WFL for tasks assessed/performed Overall Cognitive Status: Within Functional Limits for tasks assessed (a little difficulty processing twice:  likely meds)    Extremity/Trunk Assessment Upper Extremity Assessment Upper Extremity Assessment: Overall WFL for tasks assessed  Mobility Transfers Overall transfer level: Needs assistance Equipment used: Rolling walker (2  wheeled) Transfers: Sit to/from Stand Sit to Stand: Min assist General transfer comment: cues for UE and LE position     Exercise     Balance     End of Session OT - End of Session Activity Tolerance: Patient tolerated treatment well Patient left: in chair;with call bell/phone within reach  [place] 06/12/2013, 2:26 PM Lesle Chris, OTR/L [phone] 06/12/2013

## 2013-06-12 NOTE — Progress Notes (Signed)
Informed PA, Wyatt Portela of low BP that has remained this AM.    PA ordered at 500 mL NS bolus, and informed RN to inform him of any changes.  PA also informed to leave foley catheter in place until blood pressure stabilizes.

## 2013-06-12 NOTE — Progress Notes (Signed)
Notified Bryson PA about BP of 84/48 manually. 250cc NS bolus ordered. He stated that he will see pt in a few hours. Will continue to monitor.

## 2013-06-13 DIAGNOSIS — R5383 Other fatigue: Secondary | ICD-10-CM

## 2013-06-13 DIAGNOSIS — R5381 Other malaise: Secondary | ICD-10-CM

## 2013-06-13 DIAGNOSIS — D696 Thrombocytopenia, unspecified: Secondary | ICD-10-CM

## 2013-06-13 DIAGNOSIS — G8929 Other chronic pain: Secondary | ICD-10-CM

## 2013-06-13 DIAGNOSIS — M25569 Pain in unspecified knee: Secondary | ICD-10-CM

## 2013-06-13 DIAGNOSIS — R42 Dizziness and giddiness: Secondary | ICD-10-CM

## 2013-06-13 DIAGNOSIS — D649 Anemia, unspecified: Secondary | ICD-10-CM

## 2013-06-13 LAB — PREPARE RBC (CROSSMATCH)

## 2013-06-13 LAB — CBC
HEMATOCRIT: 20.7 % — AB (ref 36.0–46.0)
Hemoglobin: 7.1 g/dL — ABNORMAL LOW (ref 12.0–15.0)
MCH: 37.6 pg — ABNORMAL HIGH (ref 26.0–34.0)
MCHC: 34.3 g/dL (ref 30.0–36.0)
MCV: 109.5 fL — ABNORMAL HIGH (ref 78.0–100.0)
Platelets: 74 10*3/uL — ABNORMAL LOW (ref 150–400)
RBC: 1.89 MIL/uL — AB (ref 3.87–5.11)
RDW: 15.1 % (ref 11.5–15.5)
WBC: 4.3 10*3/uL (ref 4.0–10.5)

## 2013-06-13 MED ORDER — FUROSEMIDE 10 MG/ML IJ SOLN
20.0000 mg | Freq: Once | INTRAMUSCULAR | Status: AC
  Administered 2013-06-13: 20 mg via INTRAVENOUS
  Filled 2013-06-13: qty 2

## 2013-06-13 NOTE — Progress Notes (Addendum)
3Subjective: 2 Days Post-Op Procedure(s) (LRB): RIGHT TOTAL KNEE ARTHROPLASTY (Right) Patient reports pain as 3 on 0-10 scale.   No CP SOB dizziness Objective: Vital signs in last 24 hours: Temp:  [98 F (36.7 C)-99.8 F (37.7 C)] 98.5 F (36.9 C) (02/14 0500) Pulse Rate:  [74-89] 74 (02/14 0500) Resp:  [16] 16 (02/14 0500) BP: (84-125)/(58-91) 96/62 mmHg (02/14 0500) SpO2:  [96 %-100 %] 97 % (02/14 0500)  Intake/Output from previous day: 02/13 0701 - 02/14 0700 In: 2520 [P.O.:720; I.V.:1800] Out: 900 [Urine:900] Intake/Output this shift: Total I/O In: -  Out: 300 [Urine:300]   Recent Labs  06/12/13 0452 06/13/13 0545  HGB 7.3* 7.1*    Recent Labs  06/12/13 0452 06/13/13 0545  WBC 3.5* 4.3  RBC 1.94* 1.89*  HCT 21.4* 20.7*  PLT 75* 74*    Recent Labs  06/12/13 0452  NA 138  K 3.8  CL 104  CO2 26  BUN 13  CREATININE 0.79  GLUCOSE 94  CALCIUM 7.9*   No results found for this basename: LABPT, INR,  in the last 72 hours  Neurologically intact Sensation intact distally Intact pulses distally Dorsiflexion/Plantar flexion intact Compartment soft  Assessment/Plan: 2 Days Post-Op Procedure(s) (LRB): RIGHT TOTAL KNEE ARTHROPLASTY (Right) Advance diet Up with therapy Asx anemia though hgb 7.1 Onc was to follow. Will consult Med as no on call for Onc. Discuss transfusion parameters Discussed possible transfusion with patient.  Johonna Binette C 06/13/2013, 8:52 AM Note Discussed with medicine to consult. Transfuse due to trend, anticoag and unable to moniter at home. Found Onc on call Dr. Alvy Bimler. She rec transfuse 2 units and will follow.

## 2013-06-13 NOTE — Consult Note (Signed)
[facility] CONSULT NOTE  Patient Care Team: Hendricks Limes, MD as PCP - General  CHIEF COMPLAINTS/PURPOSE OF CONSULTATION:  Severe anemia, recent knee surgery  HISTORY OF PRESENTING ILLNESS:  [name] [name] 79 y.o. female is here because of knee replacement surgery. This patient have chronic right knee pain due to severe arthritis and has failed nonsurgical conservative treatment for severe right knee pain She has background history of CML and was seen by Dr. Humphrey Rolls. She has been on [place] for over 10 years. The last BCR/ABL showed that the patient had achieved major molecular remission. Her baseline CBC show chronic mild anemia, leukopenia fluctuation of platelet count between normal to borderline low Since her knee surgery, her blood count had dropped significantly with platelet count down to 74,000 and hemoglobin to 7.1g this morning I was consulted by Dr. Tonita Cong to follow-up on the patient's abnormal CBC The patient has been complaining of profound weakness and mild dizziness today. Denies chest pain and SOB The patient denies any recent signs or symptoms of bleeding such as spontaneous epistaxis, hematuria or hematochezia.  MEDICAL HISTORY:  Past Medical History  Diagnosis Date  . Arthritis     DJD  . Breast cancer 1993    S/P lumpectomy , radiation, & Tamoxifen  . CML (chronic myeloid leukemia)     Dr Chancy Milroy    SURGICAL HISTORY: Past Surgical History  Procedure Laterality Date  . Knee surgery  mid 80's    Arthroscopy, right knee   . Breast surgery      Right lumpectomy, s/p radiation 1993 and oral  Tamoxifen x 5 years ([id number])   . Colonoscopy  03/2003, last 2010    diverticulosis, Dr.Kaplan  . Sigmoid colon resection  2010     Dr Arlyce Dice, [place]  . Ileostomy reversal  2011    3 mos after above  . Bilateral salpingoophorectomy  2010    with bowel resection  . Gastrocslide      left leg; Dr Beola Cord  . Abdominal hysterectomy  ~1989    no BSO, for  Dysplasia   . Appendectomy  ~1989  . Leg tendon surgery Left 2013    "cut on leg and fixed something"  . Trigger finger release Left 2013    thumb  . Excision morton's neuroma Left mid 80's    foot  . Total knee arthroplasty Right 06/11/2013    Procedure: RIGHT TOTAL KNEE ARTHROPLASTY;  Surgeon: Sydnee Cabal, MD;  Location: [facility];  Service: Orthopedics;  Laterality: Right;    SOCIAL HISTORY: History   Social History  . Marital Status: Married    Spouse Name: N/A    Number of Children: N/A  . Years of Education: N/A   Occupational History  . Not on file.   Social History Main Topics  . Smoking status: Former Smoker -- 5 years    Types: Cigarettes    Quit date: 04/30/1962  . Smokeless tobacco: Never Used     Comment: Age 32   . Alcohol Use: 8.4 oz/week    14 Glasses of wine per week     Comment: 2 glasses wine in evening  . Drug Use: No  . Sexual Activity: Yes   Other Topics Concern  . Not on file   Social History Narrative  . No narrative on file    FAMILY HISTORY: Family History  Problem Relation Age of Onset  . Colon cancer Mother 45  . Uterine cancer Sister   .  Diabetes Cousin   . Cancer Father     Lip cancer  . Breast cancer Paternal Aunt   . Heart attack Paternal Uncle 96  . Stroke Neg Hx     ALLERGIES:  is allergic to penicillins.  MEDICATIONS:  Current Facility-Administered Medications  Medication Dose Route Frequency Provider Last Rate Last Dose  . 0.9 % NaCl with KCl 20 mEq/ L  infusion   Intravenous Continuous Bryson L Stilwell, PA-C 75 mL/hr at 06/13/13 0349    . acetaminophen (TYLENOL) tablet 650 mg  650 mg Oral Q6H PRN Bryson L Stilwell, PA-C       Or  . acetaminophen (TYLENOL) suppository 650 mg  650 mg Rectal Q6H PRN Bryson L Stilwell, PA-C      . alum & mag hydroxide-simeth (MAALOX/MYLANTA) 200-200-20 MG/5ML suspension 30 mL  30 mL Oral Q4H PRN Bryson L Stilwell, PA-C      . baclofen (LIORESAL) tablet 10 mg  10 mg Oral TID PRN Lajean Manes, PA-C   10 mg at 06/11/13 2326  . bisacodyl (DULCOLAX) EC tablet 5 mg  5 mg Oral Daily PRN Bryson L Stilwell, PA-C      . diphenhydrAMINE (BENADRYL) 12.5 MG/5ML elixir 12.5-25 mg  12.5-25 mg Oral Q4H PRN Bryson L Stilwell, PA-C      . enoxaparin (LOVENOX) injection 30 mg  30 mg Subcutaneous Q12H Bryson L Stilwell, PA-C   30 mg at 06/13/13 0837  . ferrous sulfate tablet 325 mg  325 mg Oral TID PC Bryson L Stilwell, PA-C   325 mg at 06/13/13 1309  . furosemide (LASIX) injection 20 mg  20 mg Intravenous Once Johnn Hai, MD      . HYDROcodone-acetaminophen (NORCO/VICODIN) 5-325 MG per tablet 1-2 tablet  1-2 tablet Oral Q4H PRN Lajean Manes, PA-C   2 tablet at 06/13/13 1309  . HYDROmorphone (DILAUDID) injection 0.5-1 mg  0.5-1 mg Intravenous Q3H PRN Bryson L Stilwell, PA-C   1 mg at 06/12/13 1454  . imatinib (GLEEVEC) tablet 200 mg  200 mg Oral Q breakfast Bryson L Stilwell, PA-C   200 mg at 06/13/13 6433  . menthol-cetylpyridinium (CEPACOL) lozenge 3 mg  1 lozenge Oral PRN Bryson L Stilwell, PA-C       Or  . phenol (CHLORASEPTIC) mouth spray 1 spray  1 spray Mouth/Throat PRN Bryson L Stilwell, PA-C      . metoCLOPramide (REGLAN) tablet 5-10 mg  5-10 mg Oral Q8H PRN Bryson L Stilwell, PA-C       Or  . metoCLOPramide (REGLAN) injection 5-10 mg  5-10 mg Intravenous Q8H PRN Bryson L Stilwell, PA-C      . ondansetron (ZOFRAN) tablet 4 mg  4 mg Oral Q6H PRN Bryson L Stilwell, PA-C       Or  . ondansetron (ZOFRAN) injection 4 mg  4 mg Intravenous Q6H PRN Bryson L Stilwell, PA-C   4 mg at 06/12/13 1218  . polyethylene glycol (MIRALAX / GLYCOLAX) packet 17 g  17 g Oral Daily PRN Bryson L Stilwell, PA-C      . senna (SENOKOT) tablet 8.6 mg  1 tablet Oral BID Bryson L Stilwell, PA-C   8.6 mg at 06/13/13 1034    REVIEW OF SYSTEMS:   Constitutional: Denies fevers, chills or abnormal night sweats Eyes: Denies blurriness of vision, double vision or watery eyes Ears, nose, mouth, throat,  and face: Denies mucositis or sore throat Respiratory: Denies cough, dyspnea or wheezes Cardiovascular: Denies palpitation, chest  discomfort or lower extremity swelling Gastrointestinal:  Denies nausea, heartburn or change in bowel habits Skin: Denies abnormal skin rashes Lymphatics: Denies new lymphadenopathy or easy bruising Neurological:Denies numbness, tingling or new weaknesses Behavioral/Psych: Mood is stable, no new changes  All other systems were reviewed with the patient and are negative.  PHYSICAL EXAMINATION: ECOG PERFORMANCE STATUS: 4 - Bedbound  Filed Vitals:   06/13/13 0800  BP: 103/56  Pulse: 74  Temp: 98.1 F (36.7 C)  Resp: 16   Filed Weights   06/11/13 2218  Weight: 144 lb (65.318 kg)    GENERAL:alert, no distress and comfortable SKIN: skin color is pale, texture, turgor are normal, no rashes or significant lesions EYES: normal, conjunctiva are pale and non-injected, sclera clear OROPHARYNX:no exudate, no erythema and lips, buccal mucosa, and tongue normal  NECK: supple, thyroid normal size, non-tender, without nodularity LYMPH:  no palpable lymphadenopathy in the cervical, axillary or inguinal LUNGS: clear to auscultation and percussion with normal breathing effort HEART: regular rate & rhythm and no murmurs and no lower extremity edema ABDOMEN:abdomen soft, non-tender and normal bowel sounds Musculoskeletal:no cyanosis of digits and no clubbing  PSYCH: alert & oriented x 3 with fluent speech NEURO: no focal motor/sensory deficits  LABORATORY DATA:  I have reviewed the data as listed Lab Results  Component Value Date   WBC 4.3 06/13/2013   HGB 7.1* 06/13/2013   HCT 20.7* 06/13/2013   MCV 109.5* 06/13/2013   PLT 74* 06/13/2013   Lab Results  Component Value Date   NA 138 06/12/2013   K 3.8 06/12/2013   CL 104 06/12/2013   CO2 26 06/12/2013   ASSESSMENT & PLAN:  #1 Severe anemia This is related to post-operative blood loss on background history of  CML I recommend transfusion of 2 units of blood now The patient is unlikely able to recover the blood loss without transfusion She is currently symptomatic from anemia OK to discharge the patient over the next few days if hemoglobin is >8g #2 Severe thrombocytopenia This is due to consumption process from recent surgery. Transfusion is not necessary. Her last platelet drop to less than 10,000 or she has active bleeding. There is no contraindication to proceed with DVT prophylaxis as long as her blood count remains greater than 50,000 #3 CML There is no contraindication for her to continue Imatinib  I will let Dr. Foye Clock knows about her inpatient progress. If the patient remain in the hospital on Monday, I will get Dr. Humphrey Rolls to see the patient next week.  All questions were answered. The patient knows to call the clinic with any problems, questions or concerns.   [name] Meenan, MD @T @ 1:51 PM    Oh

## 2013-06-13 NOTE — Progress Notes (Signed)
Occupational Therapy Treatment Patient Details Name: [name] [name] MRN: [id number] DOB: 30-Sep-1934 Today's Date: 06/13/2013 Time: 3151-7616 OT Time Calculation (min): 46 min  OT Assessment / Plan / Recommendation  History of present illness pt was admitted for R TKA   OT comments  Pt making excellent progress. Completing ADL and mobility for ADL @ S level. Completed all education regarding AE/DME and compensatory techniques. Pt appropriate for D/C home with S of husband.  Follow Up Recommendations  No OT follow up;Supervision/Assistance - 24 hour    Barriers to Discharge       Equipment Recommendations  3 in 1 bedside comode    Recommendations for Other Services    Frequency     Progress towards OT Goals Progress towards OT goals: Goals met/education completed, patient discharged from [place] All goals met and education completed, patient discharged from OT services    Precautions / Restrictions Precautions Precautions: Knee Knee Immobilizer - Right: Discontinue once straight leg raise with < 10 degree lag Restrictions Other Position/Activity Restrictions: WBAT   Pertinent Vitals/Pain no apparent distress     ADL  Lower Body Bathing: Supervision/safety Where Assessed - Lower Body Bathing: Unsupported sit to stand Upper Body Dressing: Set up Where Assessed - Upper Body Dressing: Unsupported sitting Lower Body Dressing: Supervision/safety;Set up Where Assessed - Lower Body Dressing: Unsupported sit to stand Toilet Transfer: Supervision/safety Toilet Transfer Method: Sit to stand Toileting - Clothing Manipulation and Hygiene: Supervision/safety Where Assessed - Best boy and Hygiene: Sit to stand from 3-in-1 or toilet Tub/Shower Transfer: Min guard Tub/Shower Transfer Method: Stand pivot;Ambulating Warden/ranger: Walk in Engineer, site Used: Rolling walker;Gait belt Transfers/Ambulation Related to ADLs: S ADL Comments:  Educated on home safety to reduce risk of falls and increae independence    OT Diagnosis:    OT Problem List:   OT Treatment Interventions:     OT Goals(current goals can now be found in the care plan section) Acute Rehab OT Goals Patient Stated Goal: get back to being active OT Goal Formulation: With patient Time For Goal Achievement: 06/19/13 Potential to Achieve Goals: Good ADL Goals Pt Will Transfer to Toilet: with supervision;bedside commode;ambulating Pt Will Perform Toileting - Clothing Manipulation and hygiene: with supervision;sit to/from stand Pt Will Perform Tub/Shower Transfer: with min guard assist;Shower transfer;3 in 1;ambulating Additional ADL Goal #1: pt will verbalize vs demonstrate with supervision use of reacher for LB ADLs  Visit Information  Last OT Received On: 06/13/13 Assistance Needed: +1 History of Present Illness: pt was admitted for R TKA    Subjective Data      Prior Functioning       Cognition  Cognition Arousal/Alertness: Awake/alert Behavior During Therapy: WFL for tasks assessed/performed Overall Cognitive Status: Within Functional Limits for tasks assessed    Mobility  Bed Mobility Overal bed mobility: Needs Assistance Bed Mobility: Supine to Sit;Sit to Supine Supine to sit: Supervision Sit to supine: Supervision General bed mobility comments: educated on use of sheet to move RLE Transfers Overall transfer level: Needs assistance Equipment used: Rolling walker (2 wheeled) Transfers: Sit to/from [facility] Sit to Stand: Supervision Stand pivot transfers: Supervision    Exercises  Other Exercises Other Exercises: educated pt on importance of periods of terminal extension throghot the day as tolerated.  Other Exercises: Reinforced importance of use of ice   Balance    End of Session OT - End of Session Equipment Utilized During Treatment: Rolling walker Activity Tolerance: Patient tolerated treatment  well Patient left: in bed;with call bell/phone within reach Nurse Communication: Mobility status;Other (comment) (blister R lateral thigh)  GO     [name],HILLARY 06/13/2013, 10:37 AM Maurie Boettcher, OTR/L  [phone] 06/13/2013

## 2013-06-13 NOTE — Progress Notes (Signed)
Physical Therapy Treatment Patient Details Name: [name] [name] MRN: [id number] DOB: 1934-05-02 Today's Date: 06/13/2013 Time: 8144-8185 PT Time Calculation (min): 23 min  PT Assessment / Plan / Recommendation  History of Present Illness pt was admitted for R TKA   PT Comments     Follow Up Recommendations  Home health PT     Does the patient have the potential to tolerate intense rehabilitation     Barriers to Discharge        Equipment Recommendations  Rolling walker with 5" wheels    Recommendations for Other Services OT consult  Frequency 7X/week   Progress towards PT Goals Progress towards PT goals: Progressing toward goals  Plan Current plan remains appropriate    Precautions / Restrictions Precautions Precautions: Knee Required Braces or Orthoses: Knee Immobilizer - Right Knee Immobilizer - Right: Discontinue once straight leg raise with < 10 degree lag Restrictions Weight Bearing Restrictions: No Other Position/Activity Restrictions: WBAT   Pertinent Vitals/Pain 2-3/10; premed    Mobility  Bed Mobility Overal bed mobility: Needs Assistance Bed Mobility: Supine to Sit;Sit to Supine Supine to sit: Supervision Sit to supine: Min guard General bed mobility comments: pt utilized sheet to assist R LE OOB and attempted legs crossed back into bed Transfers Overall transfer level: Needs assistance Equipment used: Rolling walker (2 wheeled) Transfers: Sit to/from Stand Sit to Stand: Supervision Stand pivot transfers: Supervision General transfer comment: cues for UE and LE position Ambulation/Gait Ambulation/Gait assistance: Min guard Ambulation Distance (Feet): 100 Feet (twice) Assistive device: Rolling walker (2 wheeled) Gait Pattern/deviations: Step-to pattern;Step-through pattern;Decreased step length - right;Decreased step length - left;Shuffle;Antalgic;Trunk flexed Gait velocity: decr General Gait Details: cues for posture, sequence, position from RW  and stride length    Exercises     PT Diagnosis:    PT Problem List:   PT Treatment Interventions:     PT Goals (current goals can now be found in the care plan section) Acute Rehab PT Goals Patient Stated Goal: get back to being active PT Goal Formulation: With patient Time For Goal Achievement: 06/17/13 Potential to Achieve Goals: Good  Visit Information  Last PT Received On: 06/13/13 Assistance Needed: +1 History of Present Illness: pt was admitted for R TKA    Subjective Data  Subjective: I want to get up and move before they start my blood Patient Stated Goal: get back to being active   Cognition  Cognition Arousal/Alertness: Awake/alert Behavior During Therapy: WFL for tasks assessed/performed Overall Cognitive Status: Within Functional Limits for tasks assessed    Balance     End of Session PT - End of Session Equipment Utilized During Treatment: Gait belt Activity Tolerance: Patient tolerated treatment well Patient left: in bed;with call bell/phone within reach Nurse Communication: Mobility status   GP     [name] [name] 06/13/2013, 3:09 PM

## 2013-06-13 NOTE — Progress Notes (Signed)
Physical Therapy Treatment Patient Details Name: [name] [name] MRN: [id number] DOB: Sep 26, 1934 Today's Date: 06/13/2013 Time: 8676-1950 PT Time Calculation (min): 33 min  PT Assessment / Plan / Recommendation  History of Present Illness pt was admitted for R TKA   PT Comments   OOB deferred, pt just up with OT and transfusion about to be started  Follow Up Recommendations  Home health PT     Does the patient have the potential to tolerate intense rehabilitation     Barriers to Discharge        Equipment Recommendations  Rolling walker with 5" wheels    Recommendations for Other Services OT consult  Frequency 7X/week   Progress towards PT Goals Progress towards PT goals: Progressing toward goals  Plan Current plan remains appropriate    Precautions / Restrictions Precautions Precautions: Knee Knee Immobilizer - Right: Discontinue once straight leg raise with < 10 degree lag Restrictions Weight Bearing Restrictions: No Other Position/Activity Restrictions: WBAT   Pertinent Vitals/Pain 4/10; premed, ice packs provided    Mobility  Bed Mobility Overal bed mobility: Needs Assistance Bed Mobility: Sit to Supine Supine to sit: Supervision Sit to supine: Supervision General bed mobility comments: educated on use of sheet to move RLE Transfers Overall transfer level: Needs assistance Equipment used: Rolling walker (2 wheeled) Transfers: Sit to/from [facility] Sit to Stand: Supervision Stand pivot transfers: Supervision    Exercises Total Joint Exercises Ankle Circles/Pumps: AROM;Both;15 reps;Supine Quad Sets: AROM;Both;Supine;20 reps Heel Slides: AAROM;Right;Supine;20 reps Straight Leg Raises: AAROM;Right;Supine;20 reps Other Exercises Other Exercises: educated pt on importance of periods of terminal extension throghot the day as tolerated.  Other Exercises: Reinforced importance of use of ice   PT Diagnosis:    PT Problem List:   PT Treatment  Interventions:     PT Goals (current goals can now be found in the care plan section) Acute Rehab PT Goals Patient Stated Goal: get back to being active PT Goal Formulation: With patient Time For Goal Achievement: 06/17/13 Potential to Achieve Goals: Good  Visit Information  Last PT Received On: 06/13/13 Assistance Needed: +1 History of Present Illness: pt was admitted for R TKA    Subjective Data  Patient Stated Goal: get back to being active   Cognition  Cognition Arousal/Alertness: Awake/alert Behavior During Therapy: WFL for tasks assessed/performed Overall Cognitive Status: Within Functional Limits for tasks assessed    Balance     End of Session PT - End of Session Equipment Utilized During Treatment: Gait belt;Right knee immobilizer Activity Tolerance: Patient tolerated treatment well;Other (comment) Patient left: in bed;with call bell/phone within reach;with family/visitor present Nurse Communication: Mobility status   GP     [name] [name] 06/13/2013, 11:49 AM

## 2013-06-14 LAB — CBC
HCT: 26.4 % — ABNORMAL LOW (ref 36.0–46.0)
HEMOGLOBIN: 9.3 g/dL — AB (ref 12.0–15.0)
MCH: 35.1 pg — AB (ref 26.0–34.0)
MCHC: 35.2 g/dL (ref 30.0–36.0)
MCV: 99.6 fL (ref 78.0–100.0)
Platelets: 86 10*3/uL — ABNORMAL LOW (ref 150–400)
RBC: 2.65 MIL/uL — AB (ref 3.87–5.11)
RDW: 19.5 % — ABNORMAL HIGH (ref 11.5–15.5)
WBC: 5 10*3/uL (ref 4.0–10.5)

## 2013-06-14 LAB — TYPE AND SCREEN
ABO/RH(D): O POS
Antibody Screen: NEGATIVE
UNIT DIVISION: 0
Unit division: 0

## 2013-06-14 NOTE — Progress Notes (Signed)
Physical Therapy Treatment Patient Details Name: [name] [name] MRN: [id number] DOB: Nov 06, 1934 Today's Date: 06/14/2013 Time: 1157-2620 PT Time Calculation (min): 23 min  PT Assessment / Plan / Recommendation  History of Present Illness pt was admitted for R TKA   PT Comments   Reviewed use of ice, HEP, knee precautions and  progression of therapies HHPT to OPPT;  Will see pt for stair training when husband arrives  Follow Up Recommendations  Home health PT     Does the patient have the potential to tolerate intense rehabilitation     Barriers to Discharge        Equipment Recommendations  Rolling walker with 5" wheels    Recommendations for Other Services    Frequency 7X/week   Progress towards PT Goals Progress towards PT goals: Progressing toward goals  Plan Current plan remains appropriate    Precautions / Restrictions Precautions Precautions: Knee Required Braces or Orthoses: Knee Immobilizer - Right Knee Immobilizer - Right: Discontinue once straight leg raise with < 10 degree lag Restrictions Other Position/Activity Restrictions: WBAT   Pertinent Vitals/Pain Had meds 60min before session, pain mostly controlled per pt, only incr with ex; ice to knee after PT    Mobility       Exercises Total Joint Exercises Ankle Circles/Pumps: AROM;Both;15 reps;Supine Quad Sets: AROM;Both;Supine;20 reps (pt doing I'ly) Short Arc Quad: AROM;AAROM;Right;15 reps Heel Slides: AAROM;Right;Supine;20 reps Straight Leg Raises: AAROM;Right;Supine;20 reps Goniometric ROM: 5-83   PT Diagnosis:    PT Problem List:   PT Treatment Interventions:     PT Goals (current goals can now be found in the care plan section) Acute Rehab PT Goals Patient Stated Goal: get back to being active PT Goal Formulation: With patient Time For Goal Achievement: 06/17/13 Potential to Achieve Goals: Good  Visit Information  Last PT Received On: 06/14/13 Assistance Needed: +1 History of Present  Illness: pt was admitted for R TKA    Subjective Data  Subjective: I want my husband to be here Patient Stated Goal: get back to being active   Cognition  Cognition Arousal/Alertness: Awake/alert Behavior During Therapy: WFL for tasks assessed/performed Overall Cognitive Status: Within Functional Limits for tasks assessed    Balance     End of Session PT - End of Session Equipment Utilized During Treatment: Gait belt Activity Tolerance: Patient tolerated treatment well Patient left: in bed;with call bell/phone within reach Nurse Communication: Mobility status   GP     [facility] 06/14/2013, 9:58 AM

## 2013-06-14 NOTE — Plan of Care (Signed)
Problem: Consults Goal: Diagnosis- Total Joint Replacement Outcome: Completed/Met Date Met:  06/14/13 Primary Total Knee RIGHT  Problem: Discharge Progression Outcomes Goal: Barriers To Progression Addressed/Resolved Outcome: Completed/Met Date Met:  06/14/13 Decreased Hgb. 2 units PRBCs given.

## 2013-06-14 NOTE — Progress Notes (Signed)
Physical Therapy Treatment Patient Details Name: [name] [name] MRN: [id number] DOB: 09-08-1934 Today's Date: 06/14/2013 Time: 1050-1108 PT Time Calculation (min): 18 min PT Assessment / Plan / Recommendation  History of Present Illness pt was admitted for R TKA   PT Comments   Pt doing well; assisted pt with dressing in preparation for home, donning, doffing of KI and reinforced techniques, standing balance safety with RW during clothing manipulation  Follow Up Recommendations  Home health PT     Does the patient have the potential to tolerate intense rehabilitation     Barriers to Discharge        Equipment Recommendations  Rolling walker with 5" wheels    Recommendations for Other Services    Frequency     Progress towards PT Goals Progress towards PT goals: Progressing toward goals  Plan Current plan remains appropriate    Precautions / Restrictions Precautions Precautions: Knee Required Braces or Orthoses: Knee Immobilizer - Right Knee Immobilizer - Right: Discontinue once straight leg raise with < 10 degree lag (reviewed with pt and husband don/doffing) Restrictions Weight Bearing Restrictions: No Other Position/Activity Restrictions: WBAT   Pertinent Vitals/Pain     Mobility  Bed Mobility Overal bed mobility: Needs Assistance Bed Mobility: Supine to Sit Supine to sit: Supervision Sit to supine: Supervision General bed mobility comments: for safety Transfers Overall transfer level: Needs assistance Equipment used: Rolling walker (2 wheeled) Transfers: Sit to/from Stand Sit to Stand: Supervision (repeated x 3) General transfer comment: cues for UE and LE position Ambulation/Gait Ambulation/Gait assistance: Supervision;Min guard Ambulation Distance (Feet): 100 Feet Assistive device: Rolling walker (2 wheeled) Gait Pattern/deviations: Step-to pattern;Step-through pattern;Antalgic Gait velocity: decr General Gait Details: cues for posture, sequence,  position from RW and stride length Stairs: Yes Stairs assistance: Min guard Stair Management: Backwards;With walker;Step to pattern Number of Stairs: 1 General stair comments: cues for sequence and technique    Exercises     PT Diagnosis:    PT Problem List:   PT Treatment Interventions:     PT Goals (current goals can now be found in the care plan section) Acute Rehab PT Goals Time For Goal Achievement: 06/17/13 Potential to Achieve Goals: Good  Visit Information  Last PT Received On: 06/14/13 Assistance Needed: +1 History of Present Illness: pt was admitted for R TKA    Subjective Data      Cognition  Cognition Arousal/Alertness: Awake/alert Behavior During Therapy: WFL for tasks assessed/performed Overall Cognitive Status: Within Functional Limits for tasks assessed    Balance     End of Session PT - End of Session Equipment Utilized During Treatment: Gait belt Activity Tolerance: Patient tolerated treatment well Patient left: in chair;with call bell/phone within reach;with family/visitor present Nurse Communication: Mobility status CPM Right Knee CPM Right Knee: Off   GP     [facility] 06/14/2013, 2:26 PM

## 2013-06-14 NOTE — Progress Notes (Signed)
   Subjective: 3 Days Post-Op Procedure(s) (LRB): RIGHT TOTAL KNEE ARTHROPLASTY (Right)   Patient reports pain as mild, pain controlled. No events throughout the night. Received 2 units of blood yesterday.  Objective:   VITALS:   Filed Vitals:   06/14/13 0607  BP: 107/65  Pulse: 75  Temp: 98.4 F (36.9 C)  Resp: 18    Neurovascular intact Dorsiflexion/Plantar flexion intact Incision: dressing C/D/I No cellulitis present Compartment soft  LABS  Recent Labs  06/12/13 0452 06/13/13 0545 06/14/13 0510  HGB 7.3* 7.1* 9.3*  HCT 21.4* 20.7* 26.4*  WBC 3.5* 4.3 5.0  PLT 75* 74* 86*     Recent Labs  06/12/13 0452  NA 138  K 3.8  BUN 13  CREATININE 0.79  GLUCOSE 94     Assessment/Plan: 3 Days Post-Op Procedure(s) (LRB): RIGHT TOTAL KNEE ARTHROPLASTY (Right) Up with therapy Discharge home with home health Follow up in 2 weeks at [facility]. Follow up with Dr. Theda Sers in 2 weeks.  Contact information:  [facility] [address], [address] [place]   PAC  06/14/2013, 8:34 AM

## 2013-06-14 NOTE — Progress Notes (Signed)
Pt stable, scripts, d/c instructions given with no questions/concerns voiced by pt.  Pt transported via wheelchair to private vehicle by NT and husband.

## 2013-06-16 NOTE — Discharge Summary (Signed)
Physician Discharge Summary  Patient ID: [name] [name] MRN: [id number] DOB/AGE: 11/28/1934 79 y.o.  Admit date: 06/11/2013 Discharge date: 06/16/2013  Admission Diagnoses: Knee OA (Right)  Discharge Diagnoses:  Active Problems:   S/P total knee arthroplasty   Discharged Condition: Stable  Hospital Course:  [name] [name] is a 79 y.o. who was admitted to [facility]. They were brought to the operating room on 06/11/2013 and underwent Procedure(s): RIGHT TOTAL KNEE ARTHROPLASTY.  Patient tolerated the procedure well and was later transferred to the recovery room and then to the orthopaedic floor for postoperative care.  They were given PO and IV analgesics for pain control following their surgery.  They were given 24 hours of postoperative antibiotics of  Anti-infectives   Start     Dose/Rate Route Frequency Ordered Stop   06/12/13 0600  vancomycin (VANCOCIN) IVPB 1000 mg/200 mL premix     1,000 mg 200 mL/hr over 60 Minutes Intravenous On call to O.R. 06/11/13 1240 06/11/13 1602   06/12/13 0300  vancomycin (VANCOCIN) IVPB 1000 mg/200 mL premix     1,000 mg 200 mL/hr over 60 Minutes Intravenous Every 12 hours 06/11/13 2014 06/12/13 0428     and started on DVT prophylaxis in the form of Lovenox.   PT and OT were ordered for total joint protocol.  Discharge planning consulted to help with postop disposition and equipment needs.  Patient had a Good night on the evening of surgery and started to get up OOB with therapy on day one.  Hemovac drain was pulled without difficulty.  Continued to work with therapy into day two.  Dressing C/D/I. Two units of PRBC transfused, for anemia. By day three, the patient had progressed with therapy and meeting their goals.  Incision was healing well.  Patient was seen in rounds and was ready to go home.  Consults: Internal Medicine  Significant Diagnostic Studies: Routine TKA  Treatments: Routine TKA  Discharge Exam: Blood pressure 97/60,  pulse 78, temperature 98.7 F (37.1 C), temperature source Oral, resp. rate 18, height 5' 1.5" (1.562 m), weight 65.318 kg (144 lb), SpO2 97.00%.   Disposition: 06-Home-Health Care Svc  Discharge Orders   Future Appointments Provider Department Dept Phone   07/22/2013 2:30 PM Deatra Robinson, MD [facility] [phone]   08/31/2013 2:30 PM [facility] [phone]   08/31/2013 3:00 PM Deatra Robinson, MD [facility] [phone]   Future Orders Complete By Expires   Call MD / Call 911  As directed    Comments:     If you experience chest pain or shortness of breath, CALL 911 and be transported to the hospital emergency room.  If you develope a fever above 101 F, pus (white drainage) or increased drainage or redness at the wound, or calf pain, call your surgeon's office.   Constipation Prevention  As directed    Comments:     Drink plenty of fluids.  Prune juice may be helpful.  You may use a stool softener, such as Colace (over the counter) 100 mg twice a day.  Use MiraLax (over the counter) for constipation as needed.   CPM  As directed    Comments:     Continuous passive motion machine (CPM):      Use the CPM from 0 to 60 for 6-8 hours per day.      You may increase by 10 degree per  day.  You may break it up into 2 or 3 sessions per day.      Use CPM for 2 weeks or until you are told to stop.   Diet - low sodium heart healthy  As directed    Discharge instructions  As directed    Comments:     Call and make an appointment for 2 weeks from now [phone]). Leave dressing in place, may remove the drain site dressing on the side. Place a band aid and neosporin over the site. May shower. Start home PT and CPM as directed. Follow D/C instructions. Take medications as directed. Weight bear as tolerated with assistive devices. Follow up with Dr. Humphrey Rolls as scheduled and PRN.   Do not put a  pillow under the knee. Place it under the heel.  As directed    Increase activity slowly as tolerated  As directed    TED hose  As directed    Comments:     Use stockings (TED hose) for 2 weeks on bilateral leg(s).  You may remove them at night for sleeping.       Medication List    STOP taking these medications       aspirin 81 MG tablet  Replaced by:  aspirin EC 325 MG tablet     loperamide 2 MG tablet  Commonly known as:  IMODIUM A-D      TAKE these medications       ALIGN 4 MG Caps  Take 1 capsule by mouth daily.     aspirin EC 325 MG tablet  Take 1 tablet (325 mg total) by mouth 2 (two) times daily.     baclofen 10 MG tablet  Commonly known as:  LIORESAL  Take 1 tablet (10 mg total) by mouth 3 (three) times daily as needed for muscle spasms.     CALCIUM-VITAMIN D PO  Take 1 tablet by mouth 2 (two) times daily.     diclofenac sodium 1 % Gel  Commonly known as:  VOLTAREN  Apply 1 g topically 4 (four) times daily.     ferrous sulfate 325 (65 FE) MG tablet  Take 1 tablet (325 mg total) by mouth 3 (three) times daily after meals.     folic acid 1 MG tablet  Commonly known as:  FOLVITE  Take 1 mg by mouth daily.     GLEEVEC 100 MG tablet  Generic drug:  imatinib  Take 200 mg by mouth daily with breakfast. Take with meals and large glass of water.Caution:Chemotherapy     glucosamine-chondroitin 500-400 MG tablet  Take 1 tablet by mouth 2 (two) times daily.     oxyCODONE-acetaminophen 5-325 MG per tablet  Commonly known as:  ROXICET  Take 1-2 tablets by mouth every 4 (four) hours as needed for severe pain.           Follow-up Information   Follow up with [facility])    Contact information:   [address] [place] [address] [phone]       Follow up with Cynda Familia, MD. Schedule an appointment as soon as possible for a visit in 2 weeks.   Specialty:   Orthopedic Surgery   Contact information:   [address] [place] [address] [phone]       Signed: Lajean Manes 06/16/2013, 5:44 PM

## 2013-07-03 ENCOUNTER — Encounter: Payer: Self-pay | Admitting: Internal Medicine

## 2013-07-22 ENCOUNTER — Ambulatory Visit (HOSPITAL_BASED_OUTPATIENT_CLINIC_OR_DEPARTMENT_OTHER): Payer: Medicare Other | Admitting: Oncology

## 2013-07-22 ENCOUNTER — Ambulatory Visit (HOSPITAL_BASED_OUTPATIENT_CLINIC_OR_DEPARTMENT_OTHER): Payer: Medicare Other

## 2013-07-22 ENCOUNTER — Telehealth: Payer: Self-pay | Admitting: Oncology

## 2013-07-22 ENCOUNTER — Encounter: Payer: Self-pay | Admitting: Oncology

## 2013-07-22 VITALS — BP 122/74 | HR 85 | Temp 98.1°F | Resp 18 | Ht 61.5 in | Wt 130.3 lb

## 2013-07-22 DIAGNOSIS — C9210 Chronic myeloid leukemia, BCR/ABL-positive, not having achieved remission: Secondary | ICD-10-CM

## 2013-07-22 DIAGNOSIS — C9211 Chronic myeloid leukemia, BCR/ABL-positive, in remission: Secondary | ICD-10-CM

## 2013-07-22 LAB — COMPREHENSIVE METABOLIC PANEL (CC13)
ALT: 16 U/L (ref 0–55)
ANION GAP: 11 meq/L (ref 3–11)
AST: 28 U/L (ref 5–34)
Albumin: 4 g/dL (ref 3.5–5.0)
Alkaline Phosphatase: 55 U/L (ref 40–150)
BUN: 18.3 mg/dL (ref 7.0–26.0)
CHLORIDE: 104 meq/L (ref 98–109)
CO2: 28 meq/L (ref 22–29)
Calcium: 9.8 mg/dL (ref 8.4–10.4)
Creatinine: 1 mg/dL (ref 0.6–1.1)
Glucose: 109 mg/dl (ref 70–140)
Potassium: 4.1 mEq/L (ref 3.5–5.1)
Sodium: 143 mEq/L (ref 136–145)
TOTAL PROTEIN: 6.5 g/dL (ref 6.4–8.3)
Total Bilirubin: 0.37 mg/dL (ref 0.20–1.20)

## 2013-07-22 LAB — CBC WITH DIFFERENTIAL/PLATELET
BASO%: 0.5 % (ref 0.0–2.0)
Basophils Absolute: 0 10*3/uL (ref 0.0–0.1)
EOS%: 2.8 % (ref 0.0–7.0)
Eosinophils Absolute: 0.1 10*3/uL (ref 0.0–0.5)
HCT: 31.9 % — ABNORMAL LOW (ref 34.8–46.6)
HGB: 10.4 g/dL — ABNORMAL LOW (ref 11.6–15.9)
LYMPH#: 1 10*3/uL (ref 0.9–3.3)
LYMPH%: 23.9 % (ref 14.0–49.7)
MCH: 35 pg — ABNORMAL HIGH (ref 25.1–34.0)
MCHC: 32.6 g/dL (ref 31.5–36.0)
MCV: 107.4 fL — ABNORMAL HIGH (ref 79.5–101.0)
MONO#: 0.5 10*3/uL (ref 0.1–0.9)
MONO%: 11.6 % (ref 0.0–14.0)
NEUT#: 2.6 10*3/uL (ref 1.5–6.5)
NEUT%: 61.2 % (ref 38.4–76.8)
Platelets: 144 10*3/uL — ABNORMAL LOW (ref 145–400)
RBC: 2.97 10*6/uL — ABNORMAL LOW (ref 3.70–5.45)
RDW: 17.5 % — AB (ref 11.2–14.5)
WBC: 4.3 10*3/uL (ref 3.9–10.3)

## 2013-07-22 NOTE — Progress Notes (Signed)
[place] OFFICE PROGRESS NOTE  Patient Care Team: Hendricks Limes, MD as PCP - General  DIAGNOSIS: 79 year old female with CML originally diagnosed in 2004   SUMMARY OF ONCOLOGIC HISTORY: Patient has been on [place] 300 mg daily since diagnosisPatient recently dropped her platelet count and weakly that was changed to 200 mg daily.   CURRENT THERAPY: Gleevec 200 mg daily   INTERVAL HISTORY: [name] [name] 79 y.o. female returns for followup visit today. She had her right knee replaced. She is healing very well. She did develop significant anemia. She currently has been on Gleevec 200 mg daily. Her CBC and platelet count looks terrific today. I do think we may be able to increase the dose. She otherwise is ambulatory. She is going through physical therapy for the knee. She has not had any bleeding or easy bruising no recurrent infections.  I have reviewed the past medical history, past surgical history, social history and family history with the patient and they are unchanged from previous note.  ALLERGIES:  is allergic to penicillins.  MEDICATIONS:  Current Outpatient Prescriptions  Medication Sig Dispense Refill  . aspirin 81 MG chewable tablet Chew 81 mg by mouth daily.      Marland Kitchen CALCIUM-VITAMIN D PO Take 1 tablet by mouth 2 (two) times daily.       . diclofenac sodium (VOLTAREN) 1 % GEL Apply 1 g topically 4 (four) times daily.       Marland Kitchen ESTRACE VAGINAL 0.1 MG/GM vaginal cream       . folic acid (FOLVITE) 1 MG tablet Take 1 mg by mouth daily.      Marland Kitchen glucosamine-chondroitin 500-400 MG tablet Take 1 tablet by mouth 2 (two) times daily.      Marland Kitchen ibuprofen (ADVIL,MOTRIN) 200 MG tablet Take 200 mg by mouth every 6 (six) hours as needed for moderate pain.      Marland Kitchen imatinib (GLEEVEC) 100 MG tablet Take 200 mg by mouth daily with breakfast. Take with meals and large glass of water.Caution:Chemotherapy      . oxyCODONE-acetaminophen (ROXICET) 5-325 MG per tablet Take 1-2 tablets  by mouth every 4 (four) hours as needed for severe pain.  60 tablet  0  . Probiotic Product (ALIGN) 4 MG CAPS Take 1 capsule by mouth daily.       No current facility-administered medications for this visit.    REVIEW OF SYSTEMS:   Constitutional: Denies fevers, chills or abnormal weight loss Eyes: Denies blurriness of vision Ears, nose, mouth, throat, and face: Denies mucositis or sore throat Respiratory: Denies cough, dyspnea or wheezes Cardiovascular: Denies palpitation, chest discomfort or lower extremity swelling Gastrointestinal:  Denies nausea, heartburn or change in bowel habits Skin: Denies abnormal skin rashes Lymphatics: Denies new lymphadenopathy or easy bruising Neurological:Denies numbness, tingling or new weaknesses Behavioral/Psych: Mood is stable, no new changes  All other systems were reviewed with the patient and are negative.  PHYSICAL EXAMINATION: ECOG PERFORMANCE STATUS: 1 - Symptomatic but completely ambulatory  Filed Vitals:   07/22/13 1449  BP: 122/74  Pulse: 85  Temp: 98.1 F (36.7 C)  Resp: 18   Filed Weights   07/22/13 1449  Weight: 130 lb 4.8 oz (59.104 kg)    GENERAL:alert, no distress and comfortable SKIN: skin color, texture, turgor are normal, no rashes or significant lesions EYES: normal, Conjunctiva are pink and non-injected, sclera clear OROPHARYNX:no exudate, no erythema and lips, buccal mucosa, and tongue normal  NECK: supple, thyroid normal size, non-tender,  without nodularity LYMPH:  no palpable lymphadenopathy in the cervical, axillary or inguinal LUNGS: clear to auscultation and percussion with normal breathing effort HEART: regular rate & rhythm and no murmurs and no lower extremity edema ABDOMEN:abdomen soft, non-tender and normal bowel sounds Musculoskeletal:no cyanosis of digits and no clubbing  NEURO: alert & oriented x 3 with fluent speech, no focal motor/sensory deficits  LABORATORY DATA:  I have reviewed the data as  listed    Component Value Date/Time   NA 143 07/22/2013 1530   NA 138 06/12/2013 0452   NA 135 09/13/2009 0924   K 4.1 07/22/2013 1530   K 3.8 06/12/2013 0452   K 4.3 09/13/2009 0924   CL 104 06/12/2013 0452   CL 105 09/04/2012 1006   CL 98 09/13/2009 0924   CO2 28 07/22/2013 1530   CO2 26 06/12/2013 0452   CO2 30 09/13/2009 0924   GLUCOSE 109 07/22/2013 1530   GLUCOSE 94 06/12/2013 0452   GLUCOSE 96 09/04/2012 1006   GLUCOSE 94 09/13/2009 0924   BUN 18.3 07/22/2013 1530   BUN 13 06/12/2013 0452   BUN 23* 09/13/2009 0924   CREATININE 1.0 07/22/2013 1530   CREATININE 0.79 06/12/2013 0452   CREATININE 1.0 09/13/2009 0924   CALCIUM 9.8 07/22/2013 1530   CALCIUM 7.9* 06/12/2013 0452   CALCIUM 9.3 09/13/2009 0924   PROT 6.5 07/22/2013 1530   PROT 6.4 04/28/2013 1440   PROT 6.9 09/13/2009 0924   ALBUMIN 4.0 07/22/2013 1530   ALBUMIN 3.6 04/28/2013 1440   AST 28 07/22/2013 1530   AST 37 04/28/2013 1440   AST 32 09/13/2009 0924   ALT 16 07/22/2013 1530   ALT 18 04/28/2013 1440   ALT 24 09/13/2009 0924   ALKPHOS 55 07/22/2013 1530   ALKPHOS 58 04/28/2013 1440   ALKPHOS 72 09/13/2009 0924   BILITOT 0.37 07/22/2013 1530   BILITOT 0.3 04/28/2013 1440   BILITOT 0.80 09/13/2009 0924   GFRNONAA 79* 06/12/2013 0452   GFRAA >90 06/12/2013 0452    No results found for this basename: SPEP, UPEP,  kappa and lambda light chains    Lab Results  Component Value Date   WBC 4.3 07/22/2013   NEUTROABS 2.6 07/22/2013   HGB 10.4* 07/22/2013   HCT 31.9* 07/22/2013   MCV 107.4* 07/22/2013   PLT 144* 07/22/2013      Chemistry      Component Value Date/Time   NA 143 07/22/2013 1530   NA 138 06/12/2013 0452   NA 135 09/13/2009 0924   K 4.1 07/22/2013 1530   K 3.8 06/12/2013 0452   K 4.3 09/13/2009 0924   CL 104 06/12/2013 0452   CL 105 09/04/2012 1006   CL 98 09/13/2009 0924   CO2 28 07/22/2013 1530   CO2 26 06/12/2013 0452   CO2 30 09/13/2009 0924   BUN 18.3 07/22/2013 1530   BUN 13 06/12/2013 0452   BUN 23* 09/13/2009 0924    CREATININE 1.0 07/22/2013 1530   CREATININE 0.79 06/12/2013 0452   CREATININE 1.0 09/13/2009 0924      Component Value Date/Time   CALCIUM 9.8 07/22/2013 1530   CALCIUM 7.9* 06/12/2013 0452   CALCIUM 9.3 09/13/2009 0924   ALKPHOS 55 07/22/2013 1530   ALKPHOS 58 04/28/2013 1440   ALKPHOS 72 09/13/2009 0924   AST 28 07/22/2013 1530   AST 37 04/28/2013 1440   AST 32 09/13/2009 0924   ALT 16 07/22/2013 1530   ALT 18 04/28/2013 1440  ALT 24 09/13/2009 0924   BILITOT 0.37 07/22/2013 1530   BILITOT 0.3 04/28/2013 1440   BILITOT 0.80 09/13/2009 0924       RADIOGRAPHIC STUDIES: I have personally reviewed the radiological images as listed and agreed with the findings in the report. No results found.    ASSESSMENT & PLAN:  79 year old female with  #1 CML in remission currently on Gleevec. She's been on 200 mg on a daily basis in preparation for her surgery. I will now have her increase the dose to 300 mg daily and we will check her CBC again in May when she has a followup appointment with me.  #2 status post right knee surgery. She is recovering from this very nicely.  #3 followup in 2 months prior to her leaving for [place] This Encounter  Procedures  . CBC with Differential    Standing Status: Future     Number of Occurrences: 1     Standing Expiration Date: 07/22/2014  . Comprehensive metabolic panel (Cmet) - [facility]    Standing Status: Future     Number of Occurrences: 1     Standing Expiration Date: 07/22/2014   All questions were answered. The patient knows to call the clinic with any problems, questions or concerns. No barriers to learning was detected. I spent 10 minutes counseling the patient face to face. The total time spent in the appointment was 15 minutes and more than 50% was on counseling and review of test results and coordination of care     Marcy Panning, MD 07/22/2013 4:14 PM

## 2013-07-22 NOTE — Telephone Encounter (Signed)
PT SENT BACK TO LAB AND GIVEN APPT SCHEDULE FOR MAY. PER 3/25 POF BACK TO LAB AND F/U AS SCHEDULED.

## 2013-07-23 ENCOUNTER — Telehealth: Payer: Self-pay

## 2013-07-23 NOTE — Telephone Encounter (Signed)
Per KK note - patient to go back to 300 mg daily of gleevec.  Pt voiced understanding.

## 2013-07-23 NOTE — Telephone Encounter (Signed)
Message copied by Prentiss Bells on Thu Jul 23, 2013  9:25 AM ------      Message from: Deatra Robinson      Created: Wed Jul 22, 2013  4:20 PM       Patient can go back to 300 mg of gleevec daily ------

## 2013-07-23 NOTE — Telephone Encounter (Signed)
LMOVM requested pt call back to clinic with number.

## 2013-07-27 ENCOUNTER — Other Ambulatory Visit (INDEPENDENT_AMBULATORY_CARE_PROVIDER_SITE_OTHER): Payer: Medicare Other

## 2013-07-27 ENCOUNTER — Ambulatory Visit (INDEPENDENT_AMBULATORY_CARE_PROVIDER_SITE_OTHER): Payer: Medicare Other | Admitting: Internal Medicine

## 2013-07-27 ENCOUNTER — Encounter: Payer: Self-pay | Admitting: Internal Medicine

## 2013-07-27 VITALS — BP 120/70 | HR 92 | Temp 97.7°F | Ht 61.25 in | Wt 127.8 lb

## 2013-07-27 DIAGNOSIS — M8580 Other specified disorders of bone density and structure, unspecified site: Secondary | ICD-10-CM

## 2013-07-27 DIAGNOSIS — E785 Hyperlipidemia, unspecified: Secondary | ICD-10-CM

## 2013-07-27 DIAGNOSIS — R7309 Other abnormal glucose: Secondary | ICD-10-CM

## 2013-07-27 DIAGNOSIS — M899 Disorder of bone, unspecified: Secondary | ICD-10-CM

## 2013-07-27 DIAGNOSIS — Z0000 Encounter for general adult medical examination without abnormal findings: Secondary | ICD-10-CM

## 2013-07-27 DIAGNOSIS — M949 Disorder of cartilage, unspecified: Secondary | ICD-10-CM

## 2013-07-27 LAB — TSH: TSH: 1.23 u[IU]/mL (ref 0.35–5.50)

## 2013-07-27 LAB — HEMOGLOBIN A1C: Hgb A1c MFr Bld: 5.4 % (ref 4.6–6.5)

## 2013-07-27 NOTE — Progress Notes (Signed)
Pre visit review using our clinic review tool, if applicable. No additional management support is needed unless otherwise documented below in the visit note. 

## 2013-07-27 NOTE — Assessment & Plan Note (Signed)
Dr Herbert Deaner monitoring cataracts

## 2013-07-27 NOTE — Assessment & Plan Note (Signed)
TSH 

## 2013-07-27 NOTE — Progress Notes (Signed)
Subjective:    Patient ID: [name] [name], female    DOB: 04-11-1935, 79 y.o.   MRN: [id number]  HPI Medicare Wellness Visit: Psychosocial and medical history were reviewed as required by Medicare (history related to abuse, antisocial behavior , firearm risk). Social history: Caffeine: all decaf , Alcohol: no , Tobacco BUL:AGTX 1964 Exercise: in Rehab for knee Personal safety/fall risk:no Limitations of activities of daily living:no Seatbelt/ smoke alarm use:yes Healthcare Power of Attorney/Living Will status: UTD Ophthalmologic exam status:UTD , cataract surgery pending Hearing evaluation status:not UTD Orientation: Oriented X 3 Memory and recall: good Spelling  testing: good Depression/anxiety assessment: no Foreign travel [place] 8/14 Immunization status for influenza/pneumonia/ shingles /tetanus:NO shingles  Due to leukemia Transfusion history:2010 & 2015 Preventive health care maintenance status: Colonoscopy/BMD/mammogram/Pap as per protocol/standard care: BMD due 6/15; ? Colonoscopy status Dental care:every 6 mos. Gyn pending Chart reviewed and updated. Active issues reviewed and addressed as documented below.    Review of Systems  Dr Laurelyn Sickle data reviewed;Gleevac dose increased.Anemia improved post op  Her nonfasting glucose was 109 on 07/22/13. A first cousin did have type I juvenile onset diabetes.  Her lipid status was reviewed; based on the advanced cholesterol testing her LDL goal is less than 160, ideally less than 130. TSH is overdue.  She has osteopenia; she would be due for followup bone density in June 2015     Objective:   Physical Exam Gen.: Healthy and well-nourished in appearance. Alert, appropriate and cooperative throughout exam. Appears younger than stated age  Head: Normocephalic without obvious abnormalities  Eyes: No corneal or conjunctival inflammation noted. Pupils equal round reactive to light and accommodation. Extraocular motion  intact.  Ears: External  ear exam reveals no significant lesions or deformities. Canals clear .TMs normal. Hearing is grossly normal bilaterally. Nose: External nasal exam reveals no deformity or inflammation. Nasal mucosa are pink and moist. No lesions or exudates noted.   Mouth: Oral mucosa and oropharynx reveal no lesions or exudates. Teeth in good repair. Neck: No deformities, masses, or tenderness noted. Range of motion decreased. Thyroid normal. Lungs: Normal respiratory effort; chest expands symmetrically. Lungs are clear to auscultation without rales, wheezes, or increased work of breathing. Heart: Normal rate and rhythm. Accentuated S1 and S2. No gallop, click, or rub. No murmur. Abdomen: Bowel sounds normal; abdomen soft and nontender. No masses, organomegaly or hernias noted. Genitalia:   As per Gyn                              Musculoskeletal/extremities: No deformity or scoliosis noted of  the thoracic or lumbar spine.   No clubbing, cyanosis, edema, or significant extremity  deformity noted. Range of motion decreased R knee > L..Fingernail  health good. Able to lie down & sit up w/o help. Negative SLR bilaterally Vascular: Carotid, radial artery, dorsalis pedis and  posterior tibial pulses are full and equal. No bruits present. Neurologic: Alert and oriented x3. Deep tendon reflexes symmetrical and normal.     Skin: Intact without suspicious lesions or rashes. Lymph: No cervical, axillary lymphadenopathy present. Psych: Mood and affect are normal. Normally interactive  Assessment & Plan:  #1 Medicare Wellness Exam; criteria met ; data entered #2 Problem List/Diagnoses reviewed  See Current Assessment & Plan in Problem List under specific Diagnosis

## 2013-07-27 NOTE — Assessment & Plan Note (Signed)
A1c

## 2013-07-27 NOTE — Patient Instructions (Signed)
Your next office appointment will be determined based upon review of your pending labs. Those instructions will be transmitted to you through My Chart . 

## 2013-08-25 ENCOUNTER — Telehealth: Payer: Self-pay | Admitting: Oncology

## 2013-08-25 NOTE — Telephone Encounter (Signed)
kk out - pt to see NG 5/14. s/w pt and she is leaving town 5/8 and will not return until fall. message to NG re see pt prior to 5/8. pt aware i will call back re appt.

## 2013-08-26 ENCOUNTER — Telehealth: Payer: Self-pay | Admitting: Hematology and Oncology

## 2013-08-26 ENCOUNTER — Ambulatory Visit (HOSPITAL_BASED_OUTPATIENT_CLINIC_OR_DEPARTMENT_OTHER): Payer: Medicare Other | Admitting: Hematology and Oncology

## 2013-08-26 ENCOUNTER — Encounter: Payer: Self-pay | Admitting: Hematology and Oncology

## 2013-08-26 ENCOUNTER — Ambulatory Visit (HOSPITAL_BASED_OUTPATIENT_CLINIC_OR_DEPARTMENT_OTHER): Payer: Medicare Other

## 2013-08-26 VITALS — BP 97/67 | HR 78 | Temp 98.0°F | Resp 18 | Ht 61.25 in | Wt 128.1 lb

## 2013-08-26 DIAGNOSIS — D649 Anemia, unspecified: Secondary | ICD-10-CM

## 2013-08-26 DIAGNOSIS — C9210 Chronic myeloid leukemia, BCR/ABL-positive, not having achieved remission: Secondary | ICD-10-CM

## 2013-08-26 DIAGNOSIS — Z853 Personal history of malignant neoplasm of breast: Secondary | ICD-10-CM

## 2013-08-26 DIAGNOSIS — D696 Thrombocytopenia, unspecified: Secondary | ICD-10-CM

## 2013-08-26 DIAGNOSIS — D539 Nutritional anemia, unspecified: Secondary | ICD-10-CM

## 2013-08-26 DIAGNOSIS — R197 Diarrhea, unspecified: Secondary | ICD-10-CM

## 2013-08-26 DIAGNOSIS — D72819 Decreased white blood cell count, unspecified: Secondary | ICD-10-CM

## 2013-08-26 LAB — COMPREHENSIVE METABOLIC PANEL (CC13)
ALBUMIN: 3.8 g/dL (ref 3.5–5.0)
ALT: 14 U/L (ref 0–55)
ANION GAP: 8 meq/L (ref 3–11)
AST: 29 U/L (ref 5–34)
Alkaline Phosphatase: 50 U/L (ref 40–150)
BUN: 20.6 mg/dL (ref 7.0–26.0)
CALCIUM: 9.7 mg/dL (ref 8.4–10.4)
CHLORIDE: 106 meq/L (ref 98–109)
CO2: 28 mEq/L (ref 22–29)
Creatinine: 0.9 mg/dL (ref 0.6–1.1)
GLUCOSE: 92 mg/dL (ref 70–140)
Potassium: 4.4 mEq/L (ref 3.5–5.1)
Sodium: 142 mEq/L (ref 136–145)
Total Bilirubin: 0.39 mg/dL (ref 0.20–1.20)
Total Protein: 6.3 g/dL — ABNORMAL LOW (ref 6.4–8.3)

## 2013-08-26 LAB — CBC WITH DIFFERENTIAL/PLATELET
BASO%: 1 % (ref 0.0–2.0)
BASOS ABS: 0 10*3/uL (ref 0.0–0.1)
EOS ABS: 0.5 10*3/uL (ref 0.0–0.5)
EOS%: 13.7 % — ABNORMAL HIGH (ref 0.0–7.0)
HEMATOCRIT: 30.5 % — AB (ref 34.8–46.6)
HEMOGLOBIN: 10.1 g/dL — AB (ref 11.6–15.9)
LYMPH#: 0.8 10*3/uL — AB (ref 0.9–3.3)
LYMPH%: 23.7 % (ref 14.0–49.7)
MCH: 35.6 pg — ABNORMAL HIGH (ref 25.1–34.0)
MCHC: 33.2 g/dL (ref 31.5–36.0)
MCV: 107.2 fL — AB (ref 79.5–101.0)
MONO#: 0.5 10*3/uL (ref 0.1–0.9)
MONO%: 13 % (ref 0.0–14.0)
NEUT#: 1.7 10*3/uL (ref 1.5–6.5)
NEUT%: 48.6 % (ref 38.4–76.8)
Platelets: 125 10*3/uL — ABNORMAL LOW (ref 145–400)
RBC: 2.84 10*6/uL — ABNORMAL LOW (ref 3.70–5.45)
RDW: 15.8 % — ABNORMAL HIGH (ref 11.2–14.5)
WBC: 3.5 10*3/uL — ABNORMAL LOW (ref 3.9–10.3)

## 2013-08-26 LAB — LACTATE DEHYDROGENASE (CC13): LDH: 249 U/L — AB (ref 125–245)

## 2013-08-26 NOTE — Telephone Encounter (Signed)
per response from NG she can see pt today. s/w pt due to conflicting appt she will come for lb @ 1215pm and come back to see NG @ 3:15pm. NG desk nurse and [place] aware. KK pt - LC will make sure labs get to NG.

## 2013-08-26 NOTE — Progress Notes (Signed)
[facility] FOLLOW-UP progress notes  Patient Care Team: Hendricks Limes, MD as PCP - General  CHIEF COMPLAINTS/PURPOSE OF VISIT:  CML on Gleevec  HISTORY OF PRESENTING ILLNESS:  [name] [name] 79 y.o. female was transferred to my care after her prior physician is on medical leave.  I reviewed the patient's records extensive and collaborated the history with the patient. Summary of her history is as follows: This patient was diagnosed with breast cancer in 1993. At the time she had lumpectomy and radiation therapy. I do not know the stage of her breast cancer but she has no evidence of recurrence. The patient was diagnosed with CML after routine blood work revealed leukocytosis. She was placed on Gleevec long-term. Her last BCR/ABL from December 2014 showed that she is a molecular remission. She has mild diarrhea on Gleevec. He denies any recent infection. She has some easy bruising but no bleeding. She tolerated knee surgery well recently.  MEDICAL HISTORY:  Past Medical History  Diagnosis Date  . Arthritis     DJD  . Breast cancer 1993    S/P lumpectomy , radiation, & Tamoxifen  . CML (chronic myeloid leukemia)     Dr Chancy Milroy  . Anemia requiring transfusions 2010 & 2015    SURGICAL HISTORY: Past Surgical History  Procedure Laterality Date  . Knee surgery  mid 80's    Arthroscopy, right knee   . Breast surgery      Right lumpectomy, s/p radiation 1993 and oral  Tamoxifen x 5 years ([id number])   . Colonoscopy  03/2003, last 2010    diverticulosis, Dr.Kaplan  . Sigmoid colon resection  2010     Dr Arlyce Dice, [place] for fistula & diverticulitis  . Ileostomy reversal  2011    3 mos after above  . Bilateral salpingoophorectomy  2010    with bowel resection  . Gastrocslide      left leg; Dr Beola Cord  . Abdominal hysterectomy  ~1989    no BSO, for Dysplasia   . Appendectomy  ~1989  . Leg tendon surgery Left 2013    "cut on leg and fixed something"  .  Trigger finger release Left 2013    thumb  . Excision morton's neuroma Left mid 80's    foot  . Total knee arthroplasty Right 06/11/2013    Procedure: RIGHT TOTAL KNEE ARTHROPLASTY;  Surgeon: Sydnee Cabal, MD;  Location: [facility];  Service: Orthopedics;  Laterality: Right;    SOCIAL HISTORY: History   Social History  . Marital Status: Married    Spouse Name: N/A    Number of Children: N/A  . Years of Education: N/A   Occupational History  . Not on file.   Social History Main Topics  . Smoking status: Former Smoker -- 5 years    Types: Cigarettes    Quit date: 04/30/1962  . Smokeless tobacco: Never Used     Comment: smoked 1957-1964, up to 1/2 ppd  . Alcohol Use: 2.4 oz/week    4 Glasses of wine per week     Comment: none  . Drug Use: No  . Sexual Activity: Yes   Other Topics Concern  . Not on file   Social History Narrative  . No narrative on file    FAMILY HISTORY: Family History  Problem Relation Age of Onset  . Colon cancer Mother 23  . Uterine cancer Sister   . Diabetes Cousin   . Cancer Father     Lip cancer  .  Breast cancer Paternal Aunt   . Heart attack Paternal Uncle 63  . Stroke Neg Hx     ALLERGIES:  is allergic to penicillins.  MEDICATIONS:  Current Outpatient Prescriptions  Medication Sig Dispense Refill  . aspirin 81 MG chewable tablet Chew 81 mg by mouth daily.      Marland Kitchen CALCIUM-VITAMIN D PO Take 1 tablet by mouth 2 (two) times daily.       . diclofenac sodium (VOLTAREN) 1 % GEL Apply 1 g topically 4 (four) times daily.       Marland Kitchen ESTRACE VAGINAL 0.1 MG/GM vaginal cream       . folic acid (FOLVITE) 1 MG tablet Take 1 mg by mouth daily.      Marland Kitchen glucosamine-chondroitin 500-400 MG tablet Take 1 tablet by mouth 2 (two) times daily.      Marland Kitchen ibuprofen (ADVIL,MOTRIN) 200 MG tablet Take 200 mg by mouth every 6 (six) hours as needed for moderate pain.      Marland Kitchen imatinib (GLEEVEC) 100 MG tablet Take 300 mg by mouth daily with breakfast. Take with meals and  large glass of water.Caution:Chemotherapy      . oxyCODONE-acetaminophen (ROXICET) 5-325 MG per tablet Take 1-2 tablets by mouth every 4 (four) hours as needed for severe pain.  60 tablet  0  . Probiotic Product (ALIGN) 4 MG CAPS Take 1 capsule by mouth daily.       No current facility-administered medications for this visit.    REVIEW OF SYSTEMS:   Constitutional: Denies fevers, chills or abnormal night sweats Eyes: Denies blurriness of vision, double vision or watery eyes Ears, nose, mouth, throat, and face: Denies mucositis or sore throat Respiratory: Denies cough, dyspnea or wheezes Cardiovascular: Denies palpitation, chest discomfort or lower extremity swelling Gastrointestinal:  Denies nausea, heartburn or change in bowel habits Skin: Denies abnormal skin rashes Lymphatics: Denies new lymphadenopathy  Neurological:Denies numbness, tingling or new weaknesses Behavioral/Psych: Mood is stable, no new changes  All other systems were reviewed with the patient and are negative.  PHYSICAL EXAMINATION: ECOG PERFORMANCE STATUS: 1 - Symptomatic but completely ambulatory  Filed Vitals:   08/26/13 1524  BP: 97/67  Pulse: 78  Temp: 98 F (36.7 C)  Resp: 18   Filed Weights   08/26/13 1524  Weight: 128 lb 1.6 oz (58.106 kg)    GENERAL:alert, no distress and comfortable SKIN: skin color, texture, turgor are normal, no rashes or significant lesions EYES: normal, conjunctiva are pink and non-injected, sclera clear OROPHARYNX:no exudate, normal lips, buccal mucosa, and tongue  NECK: supple, thyroid normal size, non-tender, without nodularity LYMPH:  no palpable lymphadenopathy in the cervical, axillary or inguinal LUNGS: clear to auscultation and percussion with normal breathing effort HEART: regular rate & rhythm and no murmurs without lower extremity edema ABDOMEN:abdomen soft, non-tender and normal bowel sounds. No palpable splenomegaly Musculoskeletal:no cyanosis of digits and no  clubbing  PSYCH: alert & oriented x 3 with fluent speech NEURO: no focal motor/sensory deficits  LABORATORY DATA:  I have reviewed the data as listed Lab Results  Component Value Date   WBC 3.5* 08/26/2013   HGB 10.1* 08/26/2013   HCT 30.5* 08/26/2013   MCV 107.2* 08/26/2013   PLT 125* 08/26/2013    Recent Labs  04/28/13 1440 06/05/13 1359 06/12/13 0452 07/22/13 1530 08/26/13 1235  NA 142 141 138 143 142  K 4.5 4.3 3.8 4.1 4.4  CL 104 102 104  --   --   CO2 29 27 26  28  28  GLUCOSE 98 89 94 109 92  BUN 24* 24* 13 18.3 20.6  CREATININE 0.96 0.99 0.79 1.0 0.9  CALCIUM 9.0 9.3 7.9* 9.8 9.7  GFRNONAA 56* 54* 79*  --   --   GFRAA 65* 63* >90  --   --   PROT 6.4  --   --  6.5 6.3*  ALBUMIN 3.6  --   --  4.0 3.8  AST 37  --   --  28 29  ALT 18  --   --  16 14  ALKPHOS 58  --   --  55 50  BILITOT 0.3  --   --  0.37 0.39   ASSESSMENT & PLAN:  #1 remote history of breast cancer Stage is unknown. Her last mammogram on 05/11/2013 show no evidence of recurrence #2 CML #3 leukopenia #4 anemia #5 thrombocytopenia Her last BCR/ABL showed she is in molecular remission. The pancytopenia is likely related to chronic therapy with Gleevec. She is not symptomatic. I would check a serum B12 level with her next visit. I gave her standing order to have CBC checked in [place] on a monthly basis and have them faxed to me for review. She will continue on current dose of Gleevec at 300 milligrams daily and will see back in 6 months. #6 diarrhea This is due to minor side effects from [place]. She will continue Imodium daily. Orders Placed This Encounter  Procedures  . CBC with Differential    Standing Status: Future     Number of Occurrences:      Standing Expiration Date: 08/26/2014  . Comprehensive metabolic panel    Standing Status: Future     Number of Occurrences:      Standing Expiration Date: 08/26/2014  . BCR-ABL    With RT-PCR technique    Standing Status: Future     Number of  Occurrences:      Standing Expiration Date: 08/26/2014    All questions were answered. The patient knows to call the clinic with any problems, questions or concerns. I spent 25 minutes counseling the patient face to face. The total time spent in the appointment was 30 minutes and more than 50% was on counseling.     Heath Lark, MD 08/26/2013 4:24 PM

## 2013-08-27 ENCOUNTER — Telehealth: Payer: Self-pay | Admitting: Hematology and Oncology

## 2013-08-27 NOTE — Telephone Encounter (Signed)
S/w the pt and she is aware of her nov appts per her request. Pt said that it is ok to just give her the appt over the phone since she will not be going to [facility] for another week.

## 2013-08-31 ENCOUNTER — Other Ambulatory Visit: Payer: Medicare Other

## 2013-08-31 ENCOUNTER — Ambulatory Visit: Payer: Medicare Other | Admitting: Oncology

## 2013-09-08 LAB — BCR/ABL (LIO MMD)

## 2013-09-09 ENCOUNTER — Telehealth: Payer: Self-pay | Admitting: *Deleted

## 2013-09-09 NOTE — Telephone Encounter (Signed)
Copy of lab results mailed to patient's [place] address

## 2013-09-10 ENCOUNTER — Ambulatory Visit: Payer: Medicare Other | Admitting: Hematology and Oncology

## 2013-09-10 ENCOUNTER — Other Ambulatory Visit: Payer: Medicare Other

## 2013-09-16 ENCOUNTER — Telehealth: Payer: Self-pay | Admitting: *Deleted

## 2013-09-16 NOTE — Telephone Encounter (Signed)
Pt reviewing her BCR ABL results and asking for interpretation of some results she does not understand. Results given to Dr. Alvy Bimler for review.

## 2013-09-16 NOTE — Telephone Encounter (Signed)
Was able to explain results to pt after Dr. Alvy Bimler clarified and interpreted results for this RN.  Pt verbalized understanding and satisfied w/ explanation.

## 2013-10-08 ENCOUNTER — Encounter: Payer: Self-pay | Admitting: *Deleted

## 2013-10-08 NOTE — Progress Notes (Signed)
CBC drawn on 5/29 received. Placed on Dr. Calton Dach desk for review.

## 2013-10-09 ENCOUNTER — Telehealth: Payer: Self-pay | Admitting: *Deleted

## 2013-10-09 NOTE — Telephone Encounter (Signed)
Message copied by Cathlean Cower on Fri Oct 09, 2013 10:31 AM ------      Message from: [facility], [place]: Thu Oct 08, 2013  3:57 PM       Please let her know we have received labs, stable.      Continue the same ------

## 2013-10-09 NOTE — Telephone Encounter (Signed)
Informed pt we received labs, they are stable,  Continue to have done monthly as ordered. She verbalized understanding.

## 2013-10-19 ENCOUNTER — Encounter: Payer: Self-pay | Admitting: Hematology and Oncology

## 2013-11-04 ENCOUNTER — Other Ambulatory Visit: Payer: Self-pay | Admitting: *Deleted

## 2013-11-04 MED ORDER — IMATINIB MESYLATE 100 MG PO TABS: 300.0000 mg | ORAL_TABLET | Freq: Every day | ORAL | Status: DC

## 2013-11-13 ENCOUNTER — Telehealth: Payer: Self-pay

## 2013-11-13 NOTE — Telephone Encounter (Signed)
LMOVM - per Dr. Alvy Bimler, CBC results ok.  Patient to call clinic for any questions.

## 2013-11-13 NOTE — Telephone Encounter (Signed)
Lab report from [place] [facility] 10/30/13 sent to scan.

## 2013-11-24 ENCOUNTER — Encounter: Payer: Self-pay | Admitting: Hematology and Oncology

## 2013-12-22 ENCOUNTER — Other Ambulatory Visit: Payer: Self-pay | Admitting: Hematology and Oncology

## 2013-12-22 ENCOUNTER — Other Ambulatory Visit: Payer: Self-pay | Admitting: *Deleted

## 2013-12-22 DIAGNOSIS — C9210 Chronic myeloid leukemia, BCR/ABL-positive, not having achieved remission: Secondary | ICD-10-CM

## 2013-12-22 MED ORDER — FOLIC ACID 1 MG PO TABS
1.0000 mg | ORAL_TABLET | Freq: Every day | ORAL | Status: DC
Start: 2013-12-22 — End: 2014-04-06

## 2013-12-25 ENCOUNTER — Encounter: Payer: Self-pay | Admitting: Hematology and Oncology

## 2014-01-07 ENCOUNTER — Telehealth: Payer: Self-pay | Admitting: *Deleted

## 2014-01-07 ENCOUNTER — Other Ambulatory Visit: Payer: Self-pay | Admitting: Hematology and Oncology

## 2014-01-07 NOTE — Telephone Encounter (Signed)
I placed POF labs for 10/6 and see me 10/13

## 2014-01-07 NOTE — Telephone Encounter (Signed)
Pt left VM states will be returning to [place] at the end of this month from [place].  She asks if Dr. Alvy Bimler wants to make appt to see her?

## 2014-01-08 ENCOUNTER — Telehealth: Payer: Self-pay | Admitting: *Deleted

## 2014-01-08 ENCOUNTER — Telehealth: Payer: Self-pay | Admitting: Hematology and Oncology

## 2014-01-08 NOTE — Telephone Encounter (Signed)
Pt left VM states cannot make it on 10/06 and 10/13 as scheduled. She will be out of town.  She can make it from 10/15 thru 10/28.   POF sent to scheduler to r/s.

## 2014-01-08 NOTE — Telephone Encounter (Signed)
lvm for pt regarding to OCT appt....amiled pt appt sched/avs and letter

## 2014-01-11 ENCOUNTER — Telehealth: Payer: Self-pay | Admitting: Hematology and Oncology

## 2014-01-11 NOTE — Telephone Encounter (Signed)
s.w. pt and advised on OCT appt changes.Marland KitchenMarland KitchenMarland KitchenMarland Kitchenpt ok adn aware

## 2014-01-13 ENCOUNTER — Encounter: Payer: Self-pay | Admitting: Hematology and Oncology

## 2014-01-13 ENCOUNTER — Encounter: Payer: Self-pay | Admitting: *Deleted

## 2014-01-13 NOTE — Progress Notes (Signed)
Faxed gleevec pa form to [facility] °

## 2014-01-13 NOTE — Progress Notes (Signed)
[facility], [id number], approved gleevec 100mg  from 01/13/14-01/14/15

## 2014-01-13 NOTE — Progress Notes (Signed)
Prior authorization request received via fax from [facility]. Forwarded to managed care department.

## 2014-01-15 ENCOUNTER — Telehealth: Payer: Self-pay | Admitting: Hematology and Oncology

## 2014-01-15 NOTE — Telephone Encounter (Signed)
pt called to confirm appt....pt ok and aware °

## 2014-01-28 ENCOUNTER — Encounter: Payer: Self-pay | Admitting: Hematology and Oncology

## 2014-01-29 ENCOUNTER — Encounter: Payer: Self-pay | Admitting: Gastroenterology

## 2014-02-02 ENCOUNTER — Other Ambulatory Visit: Payer: Medicare Other

## 2014-02-09 ENCOUNTER — Ambulatory Visit: Payer: Medicare Other | Admitting: Hematology and Oncology

## 2014-02-10 ENCOUNTER — Other Ambulatory Visit (HOSPITAL_BASED_OUTPATIENT_CLINIC_OR_DEPARTMENT_OTHER): Payer: Medicare Other

## 2014-02-10 DIAGNOSIS — C9210 Chronic myeloid leukemia, BCR/ABL-positive, not having achieved remission: Secondary | ICD-10-CM

## 2014-02-10 DIAGNOSIS — D72819 Decreased white blood cell count, unspecified: Secondary | ICD-10-CM

## 2014-02-10 DIAGNOSIS — D539 Nutritional anemia, unspecified: Secondary | ICD-10-CM

## 2014-02-10 DIAGNOSIS — D649 Anemia, unspecified: Secondary | ICD-10-CM

## 2014-02-10 DIAGNOSIS — C9290 Myeloid leukemia, unspecified, not having achieved remission: Secondary | ICD-10-CM

## 2014-02-10 DIAGNOSIS — D696 Thrombocytopenia, unspecified: Secondary | ICD-10-CM

## 2014-02-10 LAB — CBC WITH DIFFERENTIAL/PLATELET
BASO%: 0.6 % (ref 0.0–2.0)
Basophils Absolute: 0 10*3/uL (ref 0.0–0.1)
EOS%: 1.9 % (ref 0.0–7.0)
Eosinophils Absolute: 0.1 10*3/uL (ref 0.0–0.5)
HCT: 33.6 % — ABNORMAL LOW (ref 34.8–46.6)
HGB: 11.1 g/dL — ABNORMAL LOW (ref 11.6–15.9)
LYMPH#: 0.9 10*3/uL (ref 0.9–3.3)
LYMPH%: 24.2 % (ref 14.0–49.7)
MCH: 38.4 pg — AB (ref 25.1–34.0)
MCHC: 33.2 g/dL (ref 31.5–36.0)
MCV: 115.6 fL — ABNORMAL HIGH (ref 79.5–101.0)
MONO#: 0.3 10*3/uL (ref 0.1–0.9)
MONO%: 8.6 % (ref 0.0–14.0)
NEUT#: 2.4 10*3/uL (ref 1.5–6.5)
NEUT%: 64.7 % (ref 38.4–76.8)
Platelets: 132 10*3/uL — ABNORMAL LOW (ref 145–400)
RBC: 2.9 10*6/uL — AB (ref 3.70–5.45)
RDW: 14 % (ref 11.2–14.5)
WBC: 3.7 10*3/uL — AB (ref 3.9–10.3)

## 2014-02-10 LAB — VITAMIN B12: Vitamin B-12: 473 pg/mL (ref 211–911)

## 2014-02-10 LAB — COMPREHENSIVE METABOLIC PANEL (CC13)
ALT: 33 U/L (ref 0–55)
ANION GAP: 7 meq/L (ref 3–11)
AST: 33 U/L (ref 5–34)
Albumin: 3.8 g/dL (ref 3.5–5.0)
Alkaline Phosphatase: 48 U/L (ref 40–150)
BUN: 24.5 mg/dL (ref 7.0–26.0)
CALCIUM: 9.4 mg/dL (ref 8.4–10.4)
CHLORIDE: 106 meq/L (ref 98–109)
CO2: 28 meq/L (ref 22–29)
CREATININE: 0.9 mg/dL (ref 0.6–1.1)
Glucose: 97 mg/dl (ref 70–140)
Potassium: 4.4 mEq/L (ref 3.5–5.1)
SODIUM: 141 meq/L (ref 136–145)
TOTAL PROTEIN: 6.3 g/dL — AB (ref 6.4–8.3)
Total Bilirubin: 0.58 mg/dL (ref 0.20–1.20)

## 2014-02-12 ENCOUNTER — Other Ambulatory Visit: Payer: Medicare Other

## 2014-02-18 ENCOUNTER — Telehealth: Payer: Self-pay | Admitting: Hematology and Oncology

## 2014-02-18 ENCOUNTER — Ambulatory Visit (HOSPITAL_BASED_OUTPATIENT_CLINIC_OR_DEPARTMENT_OTHER): Payer: Medicare Other | Admitting: Hematology and Oncology

## 2014-02-18 ENCOUNTER — Encounter: Payer: Self-pay | Admitting: Hematology and Oncology

## 2014-02-18 VITALS — BP 122/62 | HR 71 | Temp 98.3°F | Resp 20 | Ht 61.25 in | Wt 121.5 lb

## 2014-02-18 DIAGNOSIS — D61818 Other pancytopenia: Secondary | ICD-10-CM

## 2014-02-18 DIAGNOSIS — C9210 Chronic myeloid leukemia, BCR/ABL-positive, not having achieved remission: Secondary | ICD-10-CM

## 2014-02-18 DIAGNOSIS — T451X5A Adverse effect of antineoplastic and immunosuppressive drugs, initial encounter: Secondary | ICD-10-CM

## 2014-02-18 DIAGNOSIS — C9290 Myeloid leukemia, unspecified, not having achieved remission: Secondary | ICD-10-CM

## 2014-02-18 DIAGNOSIS — Z853 Personal history of malignant neoplasm of breast: Secondary | ICD-10-CM

## 2014-02-18 DIAGNOSIS — T50905A Adverse effect of unspecified drugs, medicaments and biological substances, initial encounter: Secondary | ICD-10-CM

## 2014-02-18 DIAGNOSIS — D6959 Other secondary thrombocytopenia: Secondary | ICD-10-CM

## 2014-02-18 DIAGNOSIS — D701 Agranulocytosis secondary to cancer chemotherapy: Secondary | ICD-10-CM | POA: Insufficient documentation

## 2014-02-18 DIAGNOSIS — D630 Anemia in neoplastic disease: Secondary | ICD-10-CM

## 2014-02-18 NOTE — Assessment & Plan Note (Signed)

## 2014-02-18 NOTE — Telephone Encounter (Signed)
pt called to cx NOV appt saw MD today....done

## 2014-02-18 NOTE — Telephone Encounter (Signed)
Gave avs & cal for April & may 2016.

## 2014-02-18 NOTE — Assessment & Plan Note (Signed)
This is likely due to recent treatment. The patient denies recent history of bleeding such as epistaxis, hematuria or hematochezia. She is asymptomatic from the low platelet count. I will observe for now.  she does not require transfusion now. I will continue the chemotherapy at current dose without dosage adjustment.  If the thrombocytopenia gets progressive worse in the future, I might have to delay her treatment or adjust the chemotherapy dose.   

## 2014-02-18 NOTE — Assessment & Plan Note (Signed)
Her recent mammogram was negative for recurrence. 

## 2014-02-18 NOTE — Progress Notes (Signed)
[facility] OFFICE PROGRESS NOTE  Patient Care Team: Hendricks Limes, MD as PCP - General  SUMMARY OF ONCOLOGIC HISTORY: This patient was diagnosed with breast cancer in 1993. At the time she had lumpectomy and radiation therapy. I do not know the stage of her breast cancer but she has no evidence of recurrence. The patient was diagnosed with CML after routine blood work revealed leukocytosis. She was placed on Gleevec long-term. Her last BCR/ABL from December 2014 showed that she is a molecular remission. She has mild diarrhea on Gleevec.  INTERVAL HISTORY: Please see below for problem oriented charting. She tolerated Gleevec well. Denies recent infection. She denies any recent abnormal breast examination, palpable mass, abnormal breast appearance or nipple changes   REVIEW OF SYSTEMS:   Constitutional: Denies fevers, chills or abnormal weight loss Eyes: Denies blurriness of vision Ears, nose, mouth, throat, and face: Denies mucositis or sore throat Respiratory: Denies cough, dyspnea or wheezes Cardiovascular: Denies palpitation, chest discomfort or lower extremity swelling Gastrointestinal:  Denies nausea, heartburn or change in bowel habits Skin: Denies abnormal skin rashes Lymphatics: Denies new lymphadenopathy or easy bruising Neurological:Denies numbness, tingling or new weaknesses Behavioral/Psych: Mood is stable, no new changes  All other systems were reviewed with the patient and are negative.  I have reviewed the past medical history, past surgical history, social history and family history with the patient and they are unchanged from previous note.  ALLERGIES:  is allergic to penicillins.  MEDICATIONS:  Current Outpatient Prescriptions  Medication Sig Dispense Refill  . aspirin 81 MG chewable tablet Chew 81 mg by mouth daily.      Marland Kitchen CALCIUM-VITAMIN D PO Take 1 tablet by mouth 2 (two) times daily.       . diclofenac sodium (VOLTAREN) 1 % GEL Apply 1  g topically 4 (four) times daily.       Marland Kitchen ESTRACE VAGINAL 0.1 MG/GM vaginal cream       . folic acid (FOLVITE) 1 MG tablet Take 1 tablet (1 mg total) by mouth daily.  90 tablet  0  . GLEEVEC 100 MG tablet TAKE 3 TABLETS BY MOUTH EVERY DAY WITH MEALS AND A GLASS OF WATER  270 tablet  0  . glucosamine-chondroitin 500-400 MG tablet Take 1 tablet by mouth 2 (two) times daily.      Marland Kitchen ibuprofen (ADVIL,MOTRIN) 200 MG tablet Take 200 mg by mouth every 6 (six) hours as needed for moderate pain.      . Multiple Vitamin (MULTIVITAMIN) tablet Take 1 tablet by mouth daily.      . Probiotic Product (ALIGN) 4 MG CAPS Take 1 capsule by mouth daily.       No current facility-administered medications for this visit.    PHYSICAL EXAMINATION: ECOG PERFORMANCE STATUS: 0 - Asymptomatic  Filed Vitals:   02/18/14 1320  BP: 122/62  Pulse: 71  Temp: 98.3 F (36.8 C)  Resp: 20   Filed Weights   02/18/14 1320  Weight: 121 lb 8 oz (55.112 kg)    GENERAL:alert, no distress and comfortable SKIN: skin color, texture, turgor are normal, no rashes or significant lesions EYES: normal, Conjunctiva are pink and non-injected, sclera clear Musculoskeletal:no cyanosis of digits and no clubbing  NEURO: alert & oriented x 3 with fluent speech, no focal motor/sensory deficits  LABORATORY DATA:  I have reviewed the data as listed    Component Value Date/Time   NA 141 02/10/2014 0914   NA 138 06/12/2013 0452  NA 135 09/13/2009 0924   K 4.4 02/10/2014 0914   K 3.8 06/12/2013 0452   K 4.3 09/13/2009 0924   CL 104 06/12/2013 0452   CL 105 09/04/2012 1006   CL 98 09/13/2009 0924   CO2 28 02/10/2014 0914   CO2 26 06/12/2013 0452   CO2 30 09/13/2009 0924   GLUCOSE 97 02/10/2014 0914   GLUCOSE 94 06/12/2013 0452   GLUCOSE 96 09/04/2012 1006   GLUCOSE 94 09/13/2009 0924   BUN 24.5 02/10/2014 0914   BUN 13 06/12/2013 0452   BUN 23* 09/13/2009 0924   CREATININE 0.9 02/10/2014 0914   CREATININE 0.79 06/12/2013 0452   CREATININE  1.0 09/13/2009 0924   CALCIUM 9.4 02/10/2014 0914   CALCIUM 7.9* 06/12/2013 0452   CALCIUM 9.3 09/13/2009 0924   PROT 6.3* 02/10/2014 0914   PROT 6.4 04/28/2013 1440   PROT 6.9 09/13/2009 0924   ALBUMIN 3.8 02/10/2014 0914   ALBUMIN 3.6 04/28/2013 1440   AST 33 02/10/2014 0914   AST 37 04/28/2013 1440   AST 32 09/13/2009 0924   ALT 33 02/10/2014 0914   ALT 18 04/28/2013 1440   ALT 24 09/13/2009 0924   ALKPHOS 48 02/10/2014 0914   ALKPHOS 58 04/28/2013 1440   ALKPHOS 72 09/13/2009 0924   BILITOT 0.58 02/10/2014 0914   BILITOT 0.3 04/28/2013 1440   BILITOT 0.80 09/13/2009 0924   GFRNONAA 79* 06/12/2013 0452   GFRAA >90 06/12/2013 0452    No results found for this basename: SPEP,  UPEP,   kappa and lambda light chains    Lab Results  Component Value Date   WBC 3.7* 02/10/2014   NEUTROABS 2.4 02/10/2014   HGB 11.1* 02/10/2014   HCT 33.6* 02/10/2014   MCV 115.6* 02/10/2014   PLT 132* 02/10/2014      Chemistry      Component Value Date/Time   NA 141 02/10/2014 0914   NA 138 06/12/2013 0452   NA 135 09/13/2009 0924   K 4.4 02/10/2014 0914   K 3.8 06/12/2013 0452   K 4.3 09/13/2009 0924   CL 104 06/12/2013 0452   CL 105 09/04/2012 1006   CL 98 09/13/2009 0924   CO2 28 02/10/2014 0914   CO2 26 06/12/2013 0452   CO2 30 09/13/2009 0924   BUN 24.5 02/10/2014 0914   BUN 13 06/12/2013 0452   BUN 23* 09/13/2009 0924   CREATININE 0.9 02/10/2014 0914   CREATININE 0.79 06/12/2013 0452   CREATININE 1.0 09/13/2009 0924      Component Value Date/Time   CALCIUM 9.4 02/10/2014 0914   CALCIUM 7.9* 06/12/2013 0452   CALCIUM 9.3 09/13/2009 0924   ALKPHOS 48 02/10/2014 0914   ALKPHOS 58 04/28/2013 1440   ALKPHOS 72 09/13/2009 0924   AST 33 02/10/2014 0914   AST 37 04/28/2013 1440   AST 32 09/13/2009 0924   ALT 33 02/10/2014 0914   ALT 18 04/28/2013 1440   ALT 24 09/13/2009 0924   BILITOT 0.58 02/10/2014 0914   BILITOT 0.3 04/28/2013 1440   BILITOT 0.80 09/13/2009 0924     ASSESSMENT & PLAN:   Chronic myeloid leukemia Her last BCR/ABL showed she is in molecular remission.  The pancytopenia is likely related to chronic therapy with Gleevec. She is not symptomatic. She will continue on current dose of Gleevec at 300 milligrams daily and will see back in 6 months.   Breast cancer, right breast Her recent mammogram was negative for recurrence.  Anemia in  neoplastic disease This is likely due to recent treatment. The patient denies recent history of bleeding such as epistaxis, hematuria or hematochezia. She is asymptomatic from the anemia. I will observe for now.  She does not require transfusion now. I will continue the chemotherapy at current dose without dosage adjustment.  If the anemia gets progressive worse in the future, I might have to delay her treatment or adjust the chemotherapy dose.   Thrombocytopenia due to drugs This is likely due to recent treatment. The patient denies recent history of bleeding such as epistaxis, hematuria or hematochezia. She is asymptomatic from the low platelet count. I will observe for now.  she does not require transfusion now. I will continue the chemotherapy at current dose without dosage adjustment.  If the thrombocytopenia gets progressive worse in the future, I might have to delay her treatment or adjust the chemotherapy dose.    Leukopenia due to antineoplastic chemotherapy This is likely due to recent treatment. The patient denies recent history of fevers, cough, chills, diarrhea or dysuria. She is asymptomatic from the leukopenia. I will observe for now.  I will continue the chemotherapy at current dose without dosage adjustment.  If the leukopenia gets progressive worse in the future, I might have to delay her treatment or adjust the chemotherapy dose.      Orders Placed This Encounter  Procedures  . Comprehensive metabolic panel    Standing Status: Future     Number of Occurrences:      Standing Expiration Date: 03/25/2015  .  BCR-ABL    With RT-PCR technique    Standing Status: Future     Number of Occurrences:      Standing Expiration Date: 03/25/2015  . CBC with Differential    Standing Status: Future     Number of Occurrences:      Standing Expiration Date: 03/25/2015   All questions were answered. The patient knows to call the clinic with any problems, questions or concerns. No barriers to learning was detected. I spent 25 minutes counseling the patient face to face. The total time spent in the appointment was 30 minutes and more than 50% was on counseling and review of test results     [facility], [place], MD 02/18/2014 8:12 PM

## 2014-02-18 NOTE — Assessment & Plan Note (Signed)
Her last BCR/ABL showed she is in molecular remission.  The pancytopenia is likely related to chronic therapy with Gleevec. She is not symptomatic. She will continue on current dose of Gleevec at 300 milligrams daily and will see back in 6 months. 

## 2014-02-18 NOTE — Assessment & Plan Note (Signed)
This is likely due to recent treatment. The patient denies recent history of fevers, cough, chills, diarrhea or dysuria. She is asymptomatic from the leukopenia. I will observe for now.  I will continue the chemotherapy at current dose without dosage adjustment.  If the leukopenia gets progressive worse in the future, I might have to delay her treatment or adjust the chemotherapy dose.   

## 2014-02-22 ENCOUNTER — Telehealth: Payer: Self-pay | Admitting: *Deleted

## 2014-02-22 NOTE — Telephone Encounter (Signed)
Informed pt of BCR ABL results are good per Dr. Alvy Bimler.

## 2014-02-24 LAB — BCR/ABL (LIO MMD)

## 2014-03-02 ENCOUNTER — Other Ambulatory Visit: Payer: Medicare Other

## 2014-03-02 ENCOUNTER — Ambulatory Visit: Payer: Medicare Other | Admitting: Hematology and Oncology

## 2014-03-03 ENCOUNTER — Encounter: Payer: Self-pay | Admitting: Gastroenterology

## 2014-03-03 ENCOUNTER — Telehealth: Payer: Self-pay | Admitting: Gastroenterology

## 2014-03-16 ENCOUNTER — Encounter: Payer: Self-pay | Admitting: Internal Medicine

## 2014-04-06 ENCOUNTER — Other Ambulatory Visit: Payer: Self-pay | Admitting: Hematology and Oncology

## 2014-04-19 ENCOUNTER — Other Ambulatory Visit: Payer: Self-pay | Admitting: Hematology and Oncology

## 2014-05-03 ENCOUNTER — Ambulatory Visit (AMBULATORY_SURGERY_CENTER): Payer: Medicare Other | Admitting: *Deleted

## 2014-05-03 VITALS — Ht 61.5 in | Wt 122.2 lb

## 2014-05-03 DIAGNOSIS — Z800 Family history of malignant neoplasm of digestive organs: Secondary | ICD-10-CM

## 2014-05-03 MED ORDER — NA SULFATE-K SULFATE-MG SULF 17.5-3.13-1.6 GM/177ML PO SOLN: 1.0000 | Freq: Once | ORAL | Status: DC

## 2014-05-03 NOTE — Progress Notes (Signed)
Denies allergies to eggs or soy products. Denies complications with sedation or anesthesia. Denies O2 use. Denies use of diet or weight loss medications.  Emmi instructions given for colonoscopy.  

## 2014-05-07 ENCOUNTER — Encounter: Payer: Medicare Other | Admitting: Gastroenterology

## 2014-05-10 NOTE — Telephone Encounter (Signed)
Not needed

## 2014-05-18 ENCOUNTER — Encounter: Payer: Self-pay | Admitting: Gastroenterology

## 2014-05-18 ENCOUNTER — Ambulatory Visit (AMBULATORY_SURGERY_CENTER): Payer: Medicare Other | Admitting: Gastroenterology

## 2014-05-18 VITALS — BP 119/54 | HR 57 | Temp 97.7°F | Resp 16 | Ht 61.6 in | Wt 122.0 lb

## 2014-05-18 DIAGNOSIS — D124 Benign neoplasm of descending colon: Secondary | ICD-10-CM

## 2014-05-18 DIAGNOSIS — Z800 Family history of malignant neoplasm of digestive organs: Secondary | ICD-10-CM

## 2014-05-18 DIAGNOSIS — D122 Benign neoplasm of ascending colon: Secondary | ICD-10-CM

## 2014-05-18 DIAGNOSIS — K5730 Diverticulosis of large intestine without perforation or abscess without bleeding: Secondary | ICD-10-CM

## 2014-05-18 DIAGNOSIS — Z1211 Encounter for screening for malignant neoplasm of colon: Secondary | ICD-10-CM

## 2014-05-18 MED ORDER — SODIUM CHLORIDE 0.9 % IV SOLN: 500.0000 mL | INTRAVENOUS | Status: DC

## 2014-05-18 NOTE — Progress Notes (Signed)
Report to PACU, RN, vss, BBS= Clear.  

## 2014-05-18 NOTE — Patient Instructions (Signed)
YOU HAD AN ENDOSCOPIC PROCEDURE TODAY AT THE [facility]: Refer to the procedure report that was given to you for any specific questions about what was found during the examination.  If the procedure report does not answer your questions, please call your gastroenterologist to clarify.  If you requested that your care partner not be given the details of your procedure findings, then the procedure report has been included in a sealed envelope for you to review at your convenience later.  YOU SHOULD EXPECT: Some feelings of bloating in the abdomen. Passage of more gas than usual.  Walking can help get rid of the air that was put into your GI tract during the procedure and reduce the bloating. If you had a lower endoscopy (such as a colonoscopy or flexible sigmoidoscopy) you may notice spotting of blood in your stool or on the toilet paper. If you underwent a bowel prep for your procedure, then you may not have a normal bowel movement for a few days.  DIET: Your first meal following the procedure should be a light meal and then it is ok to progress to your normal diet.  A half-sandwich or bowl of soup is an example of a good first meal.  Heavy or fried foods are harder to digest and may make you feel nauseous or bloated.  Likewise meals heavy in dairy and vegetables can cause extra gas to form and this can also increase the bloating.  Drink plenty of fluids but you should avoid alcoholic beverages for 24 hours.  ACTIVITY: Your care partner should take you home directly after the procedure.  You should plan to take it easy, moving slowly for the rest of the day.  You can resume normal activity the day after the procedure however you should NOT DRIVE or use heavy machinery for 24 hours (because of the sedation medicines used during the test).    SYMPTOMS TO REPORT IMMEDIATELY: A gastroenterologist can be reached at any hour.  During normal business hours, 8:30 AM to 5:00 PM Monday through Friday,  call [phone].  After hours and on weekends, please call the GI answering service at [phone] who will take a message and have the physician on call contact you.   Following lower endoscopy (colonoscopy or flexible sigmoidoscopy):  Excessive amounts of blood in the stool  Significant tenderness or worsening of abdominal pains  Swelling of the abdomen that is new, acute  Fever of 100F or higher    FOLLOW UP: If any biopsies were taken you will be contacted by phone or by letter within the next 1-3 weeks.  Call your gastroenterologist if you have not heard about the biopsies in 3 weeks.  Our staff will call the home number listed on your records the next business day following your procedure to check on you and address any questions or concerns that you may have at that time regarding the information given to you following your procedure. This is a courtesy call and so if there is no answer at the home number and we have not heard from you through the emergency physician on call, we will assume that you have returned to your regular daily activities without incident.  SIGNATURES/CONFIDENTIALITY: You and/or your care partner have signed paperwork which will be entered into your electronic medical record.  These signatures attest to the fact that that the information above on your After Visit Summary has been reviewed and is understood.  Full responsibility of the confidentiality   of this discharge information lies with you and/or your care-partner.     

## 2014-05-18 NOTE — Progress Notes (Signed)
Called to room to assist during endoscopic procedure.  Patient ID and intended procedure confirmed with present staff. Received instructions for my participation in the procedure from the performing physician.  

## 2014-05-18 NOTE — Op Note (Addendum)
[facility]  [address] [place], [address]   COLONOSCOPY PROCEDURE REPORT  PATIENT: [name], [name]  MR#: [id number] BIRTHDATE: 02/25/1935 , 79  yrs. old GENDER: female ENDOSCOPIST: Inda Castle, MD REFERRED KN:[id number] Linna Darner, M.D. PROCEDURE DATE:  05/18/2014 PROCEDURE:   Colonoscopy with cold biopsy polypectomy and Colonoscopy with snare polypectomy First Screening Colonoscopy - Avg.  risk and is 50 yrs.  old or older - No.  Prior Negative Screening - Now for repeat screening. Above average risk  History of Adenoma - Now for follow-up colonoscopy & has been > or = to 3 yrs.  N/A  Polyps Removed Today? Yes. ASA CLASS:   Class II INDICATIONS:patient's immediate family history of colon cancer. MEDICATIONS: Monitored anesthesia care and Propofol 200 mg IV  DESCRIPTION OF PROCEDURE:   After the risks benefits and alternatives of the procedure were thoroughly explained, informed consent was obtained.  The digital rectal exam revealed no abnormalities of the rectum.   The [id number]  endoscope was introduced through the anus and advanced to the cecum, which was identified by both the appendix and ileocecal valve. No adverse events experienced.   The quality of the prep was excellent using Suprep  The instrument was then slowly withdrawn as the colon was fully examined.    COLON FINDINGS: 3 mm angiodysplastic lesion was found in the proximal transverse colon.   A sessile polyp measuring 2 mm in size was found in the ascending colon.  A polypectomy was performed with cold forceps.   A sessile polyp measuring 3 mm in size was found in the descending colon.  A polypectomy was performed with a cold snare.  The resection was complete, the polyp tissue was completely retrieved and sent to histology.   There was moderate diverticulosis noted in the sigmoid colon.   Internal hemorrhoids were found.   There was mild diverticulosis noted in the  ascending colon, transverse colon, and descending colon.  Retroflexed views revealed no abnormalities. The time to cecum=3 minutes 11 seconds. Withdrawal time=10 minutes 07 seconds.  The scope was withdrawn and the procedure completed. COMPLICATIONS: There were no immediate complications.  ENDOSCOPIC IMPRESSION: 1.   Moderate diverticulosis was noted in the sigmoid colon 2.   Internal hemorrhoids 3.   Sessile polyp was found in the descending colon; polypectomy was performed with a cold snare 4.   72mm angiodysplastic lesion in the proximal transverse colon 5.   Sessile polyp was found in the ascending colon; polypectomy was performed with cold forceps 6.  scattered diverticula in descending transverse and ascending colon  RECOMMENDATIONS: Given your age, you will not need another colonoscopy for colon cancer screening or polyp surveillance.  These types of tests usually stop around the age 42.  eSigned:  Inda Castle, MD 05/18/2014 8:50 AM Revised: 05/18/2014 8:50 AM  cc:   PATIENT NAME:  [name], [name] MR#: [id number]

## 2014-05-19 ENCOUNTER — Telehealth: Payer: Self-pay | Admitting: *Deleted

## 2014-05-19 NOTE — Telephone Encounter (Signed)
  Follow up Call-  Call back number 05/18/2014  Post procedure Call Back phone  # [phone]  Permission to leave phone message Yes     Patient questions:  Do you have a fever, pain , or abdominal swelling? No. Pain Score  0 *  Have you tolerated food without any problems? Yes.    Have you been able to return to your normal activities? Yes.    Do you have any questions about your discharge instructions: Diet   No. Medications  No. Follow up visit  No.  Do you have questions or concerns about your Care? No.  Actions: * If pain score is 4 or above: No action needed, pain <4.

## 2014-05-20 ENCOUNTER — Telehealth: Payer: Self-pay | Admitting: Hematology and Oncology

## 2014-05-20 NOTE — Telephone Encounter (Signed)
pt called to r/s may appt....pt  ok and aware of new d.t

## 2014-05-25 ENCOUNTER — Telehealth: Payer: Self-pay | Admitting: Gastroenterology

## 2014-05-25 NOTE — Telephone Encounter (Signed)
Advised patient the results have not been reviewed yet.

## 2014-05-26 ENCOUNTER — Encounter: Payer: Self-pay | Admitting: Gastroenterology

## 2014-06-08 HISTORY — PX: CATARACT EXTRACTION: SUR2

## 2014-07-10 ENCOUNTER — Other Ambulatory Visit: Payer: Self-pay | Admitting: Hematology and Oncology

## 2014-07-21 ENCOUNTER — Other Ambulatory Visit: Payer: Self-pay | Admitting: Hematology and Oncology

## 2014-07-29 ENCOUNTER — Encounter: Payer: Self-pay | Admitting: Internal Medicine

## 2014-08-06 ENCOUNTER — Telehealth: Payer: Self-pay | Admitting: Hematology and Oncology

## 2014-08-06 ENCOUNTER — Encounter: Payer: Self-pay | Admitting: Internal Medicine

## 2014-08-06 ENCOUNTER — Ambulatory Visit (INDEPENDENT_AMBULATORY_CARE_PROVIDER_SITE_OTHER): Payer: Medicare Other | Admitting: Internal Medicine

## 2014-08-06 VITALS — BP 110/70 | HR 74 | Temp 97.9°F | Ht 61.5 in | Wt 120.8 lb

## 2014-08-06 DIAGNOSIS — Z0000 Encounter for general adult medical examination without abnormal findings: Secondary | ICD-10-CM | POA: Diagnosis not present

## 2014-08-06 DIAGNOSIS — I714 Abdominal aortic aneurysm, without rupture, unspecified: Secondary | ICD-10-CM

## 2014-08-06 DIAGNOSIS — Z86010 Personal history of colonic polyps: Secondary | ICD-10-CM | POA: Insufficient documentation

## 2014-08-06 NOTE — Progress Notes (Signed)
Pre visit review using our clinic review tool, if applicable. No additional management support is needed unless otherwise documented below in the visit note. 

## 2014-08-06 NOTE — Telephone Encounter (Signed)
s.w. pt husband and advisedon 5.10 appt moved to 5.17 per MD out of the office....ok adn aware

## 2014-08-06 NOTE — Progress Notes (Signed)
Subjective:    Patient ID: [name] [name], female    DOB: April 19, 1935, 80 y.o.   MRN: [id number]  HPI Medicare Wellness Visit: Psychosocial and medical history were reviewed as required by Medicare (history related to abuse, antisocial behavior , firearm risk). Social history: Caffeine: all decaf Alcohol:2/night   Tobacco XAJ:OINOMV 1956-64, up to 1/2 ppd Exercise:2 mpd 5 X /week as walking Personal safety/fall risk:no Limitations of activities of daily living:no Seatbelt/ smoke alarm use:yes Healthcare Power of Attorney/Living Will status and End of Life process assessment : UTD Ophthalmologic exam status:UTD Hearing evaluation status:not UTD Orientation: Oriented X 3 Memory and recall: good Math testing: good Depression/anxiety assessment: no Foreign travel history:2014 [place] Immunization status for influenza/pneumonia/ shingles /tetanus:can't take Shingles due to [facility] Transfusion history:? 2015 Preventive health care maintenance status: Colonoscopy/BMD/mammogram/Pap as per protocol/standard care:UTD Dental care:every 6 mos Chart reviewed and updated. Active issues reviewed and addressed as documented below.  She is on a heart healthy diet. Exercises delineated above. She has no cardio pulmonary symptoms with the walking.  She was found to have a tubular adenoma in January of this year. This is first time she's had any polyps. Her mother did have colon cancer. She is told that  further colonoscopies can be revisited in 5 years. She has no active GI symptoms.    Review of Systems  Chest pain, palpitations, tachycardia, exertional dyspnea, paroxysmal nocturnal dyspnea, claudication or edema are absent. Unexplained weight loss, abdominal pain, significant dyspepsia, dysphagia, melena, rectal bleeding, or persistently small caliber stools are denied.     Objective:   Physical Exam Gen.: Thin but adequately nourished in appearance. Alert, appropriate and cooperative throughout  exam. BMI:22.45 Appears younger than stated age  Head: Normocephalic without obvious abnormalities  Eyes: No corneal or conjunctival inflammation noted. Pupils equal round reactive to light and accommodation. Extraocular motion intact.  Ears: External  ear exam reveals no significant lesions or deformities. Canals clear .TMs normal. Hearing is grossly normal bilaterally. Nose: External nasal exam reveals no deformity or inflammation. Nasal mucosa are pink and moist. No lesions or exudates noted.   Mouth: Oral mucosa and oropharynx reveal no lesions or exudates. Teeth in good repair. Neck: No deformities, masses, or tenderness noted. Range of motion & Thyroid normal Lungs: Normal respiratory effort; chest expands symmetrically. Lungs are clear to auscultation without rales, wheezes, or increased work of breathing. Heart: Normal rate and rhythm. Normal S1 and S2. No gallop, click, or rub.No murmur. Abdomen: Bowel sounds normal; abdomen soft and nontender. No masses, organomegaly or hernias noted.Aorta palpable ;5.5 cm  AAA clinically Genitalia: as per Gyn                                  Musculoskeletal/extremities: No deformity or scoliosis noted of  the thoracic or lumbar spine. No clubbing, cyanosis, edema, or significant extremity  deformity noted.  Range of motion normal . Tone & strength normal. Hand joints normal Fingernail  health good. Able to lie down & sit up w/o help.  Negative SLR bilaterally Vascular: Carotid, radial artery, dorsalis pedis and  posterior tibial pulses are full and equal. No bruits present. Neurologic: Alert and oriented x3. Deep tendon reflexes symmetrical but 0+ @ kneesl.  Gait normal      Skin: Intact without suspicious lesions or rashes. Lymph: No cervical, axillary lymphadenopathy present. Psych: Mood and affect are normal. Normally interactive  Assessment & Plan:  #1  See Current Assessment & Plan in Problem List under specific DiagnosisThe labs will be reviewed and risks and options assessed. Written recommendations will be provided by mail or directly through My Chart.Further evaluation or change in medical therapy will be directed by those results.  #2 enlarged aorta ; clinically high risk of AAA The patient has a family history of AAA in her father.[place] indicated  #3 tubular adenoma; colonoscopy in 5 years

## 2014-08-06 NOTE — Patient Instructions (Signed)
  Your next office appointment will be determined based upon review of your pending Ultrasound. Those instructions will be transmitted to you by mail for your records.  Critical results will be called.

## 2014-08-07 DIAGNOSIS — I714 Abdominal aortic aneurysm, without rupture, unspecified: Secondary | ICD-10-CM | POA: Insufficient documentation

## 2014-08-07 NOTE — Assessment & Plan Note (Signed)
[place] of aorta

## 2014-08-17 ENCOUNTER — Ambulatory Visit
Admission: RE | Admit: 2014-08-17 | Discharge: 2014-08-17 | Disposition: A | Discharge status: Home / Self Care | Payer: Medicare Other | Source: Ambulatory Visit | Attending: Internal Medicine | Admitting: Internal Medicine

## 2014-08-17 DIAGNOSIS — I714 Abdominal aortic aneurysm, without rupture, unspecified: Secondary | ICD-10-CM

## 2014-08-20 ENCOUNTER — Ambulatory Visit (HOSPITAL_BASED_OUTPATIENT_CLINIC_OR_DEPARTMENT_OTHER): Payer: Medicare Other

## 2014-08-20 DIAGNOSIS — D630 Anemia in neoplastic disease: Secondary | ICD-10-CM | POA: Diagnosis not present

## 2014-08-20 DIAGNOSIS — C9290 Myeloid leukemia, unspecified, not having achieved remission: Secondary | ICD-10-CM

## 2014-08-20 DIAGNOSIS — D6959 Other secondary thrombocytopenia: Secondary | ICD-10-CM | POA: Diagnosis not present

## 2014-08-20 DIAGNOSIS — C9210 Chronic myeloid leukemia, BCR/ABL-positive, not having achieved remission: Secondary | ICD-10-CM

## 2014-08-20 LAB — CBC WITH DIFFERENTIAL/PLATELET
BASO%: 0.9 % (ref 0.0–2.0)
BASOS ABS: 0 10*3/uL (ref 0.0–0.1)
EOS ABS: 0.2 10*3/uL (ref 0.0–0.5)
EOS%: 5.6 % (ref 0.0–7.0)
HCT: 32.3 % — ABNORMAL LOW (ref 34.8–46.6)
HEMOGLOBIN: 10.9 g/dL — AB (ref 11.6–15.9)
LYMPH%: 42.7 % (ref 14.0–49.7)
MCH: 38.3 pg — ABNORMAL HIGH (ref 25.1–34.0)
MCHC: 33.7 g/dL (ref 31.5–36.0)
MCV: 113.6 fL — AB (ref 79.5–101.0)
MONO#: 0.4 10*3/uL (ref 0.1–0.9)
MONO%: 13.6 % (ref 0.0–14.0)
NEUT#: 1.1 10*3/uL — ABNORMAL LOW (ref 1.5–6.5)
NEUT%: 37.2 % — ABNORMAL LOW (ref 38.4–76.8)
PLATELETS: 138 10*3/uL — AB (ref 145–400)
RBC: 2.84 10*6/uL — AB (ref 3.70–5.45)
RDW: 13.2 % (ref 11.2–14.5)
WBC: 2.9 10*3/uL — ABNORMAL LOW (ref 3.9–10.3)
lymph#: 1.2 10*3/uL (ref 0.9–3.3)

## 2014-08-20 LAB — COMPREHENSIVE METABOLIC PANEL (CC13)
ALT: 20 U/L (ref 0–55)
AST: 35 U/L — ABNORMAL HIGH (ref 5–34)
Albumin: 3.7 g/dL (ref 3.5–5.0)
Alkaline Phosphatase: 54 U/L (ref 40–150)
Anion Gap: 9 mEq/L (ref 3–11)
BUN: 20.6 mg/dL (ref 7.0–26.0)
CO2: 27 mEq/L (ref 22–29)
CREATININE: 0.8 mg/dL (ref 0.6–1.1)
Calcium: 9.1 mg/dL (ref 8.4–10.4)
Chloride: 105 mEq/L (ref 98–109)
EGFR: 69 mL/min/{1.73_m2} — AB (ref >90)
GLUCOSE: 96 mg/dL (ref 70–140)
Potassium: 4.5 mEq/L (ref 3.5–5.1)
Sodium: 140 mEq/L (ref 136–145)
Total Bilirubin: 0.43 mg/dL (ref 0.20–1.20)
Total Protein: 6.2 g/dL — ABNORMAL LOW (ref 6.4–8.3)

## 2014-09-03 ENCOUNTER — Ambulatory Visit: Payer: Medicare Other | Admitting: Hematology and Oncology

## 2014-09-07 ENCOUNTER — Ambulatory Visit: Payer: Medicare Other | Admitting: Hematology and Oncology

## 2014-09-14 ENCOUNTER — Ambulatory Visit (HOSPITAL_BASED_OUTPATIENT_CLINIC_OR_DEPARTMENT_OTHER): Payer: Medicare Other | Admitting: Hematology and Oncology

## 2014-09-14 ENCOUNTER — Encounter: Payer: Self-pay | Admitting: Hematology and Oncology

## 2014-09-14 ENCOUNTER — Telehealth: Payer: Self-pay | Admitting: Hematology and Oncology

## 2014-09-14 VITALS — BP 123/66 | HR 77 | Temp 98.5°F | Resp 18 | Ht 61.5 in | Wt 125.4 lb

## 2014-09-14 DIAGNOSIS — C9290 Myeloid leukemia, unspecified, not having achieved remission: Secondary | ICD-10-CM

## 2014-09-14 DIAGNOSIS — D6959 Other secondary thrombocytopenia: Secondary | ICD-10-CM

## 2014-09-14 DIAGNOSIS — D72819 Decreased white blood cell count, unspecified: Secondary | ICD-10-CM | POA: Diagnosis not present

## 2014-09-14 DIAGNOSIS — T50905A Adverse effect of unspecified drugs, medicaments and biological substances, initial encounter: Secondary | ICD-10-CM

## 2014-09-14 DIAGNOSIS — D701 Agranulocytosis secondary to cancer chemotherapy: Secondary | ICD-10-CM

## 2014-09-14 DIAGNOSIS — C9210 Chronic myeloid leukemia, BCR/ABL-positive, not having achieved remission: Secondary | ICD-10-CM

## 2014-09-14 DIAGNOSIS — R5383 Other fatigue: Secondary | ICD-10-CM

## 2014-09-14 DIAGNOSIS — D630 Anemia in neoplastic disease: Secondary | ICD-10-CM | POA: Diagnosis not present

## 2014-09-14 DIAGNOSIS — Z853 Personal history of malignant neoplasm of breast: Secondary | ICD-10-CM

## 2014-09-14 DIAGNOSIS — T451X5A Adverse effect of antineoplastic and immunosuppressive drugs, initial encounter: Secondary | ICD-10-CM

## 2014-09-14 DIAGNOSIS — C50911 Malignant neoplasm of unspecified site of right female breast: Secondary | ICD-10-CM

## 2014-09-14 NOTE — Assessment & Plan Note (Signed)
This is likely due to recent treatment. The patient denies recent history of fevers, cough, chills, diarrhea or dysuria. She is asymptomatic from the leukopenia. I will observe for now.  I will continue the chemotherapy at current dose without dosage adjustment.  If the leukopenia gets progressive worse in the future, I might have to delay her treatment or adjust the chemotherapy dose.   

## 2014-09-14 NOTE — Assessment & Plan Note (Signed)
Her last BCR/ABL showed she is in molecular remission.  The pancytopenia is likely related to chronic therapy with Gleevec. She is not symptomatic. She will continue on current dose of Gleevec at 300 milligrams daily and will see back in 6 months.

## 2014-09-14 NOTE — Telephone Encounter (Signed)
per pof to sch pt appt-gave pt copy of sch °

## 2014-09-14 NOTE — Assessment & Plan Note (Signed)
Her recent mammogram was negative for recurrence. 

## 2014-09-14 NOTE — Assessment & Plan Note (Signed)

## 2014-09-14 NOTE — Progress Notes (Signed)
[facility] OFFICE PROGRESS NOTE  Patient Care Team: Hendricks Limes, MD as PCP - General  SUMMARY OF ONCOLOGIC HISTORY:   Chronic myeloid leukemia   02/18/2007 Initial Diagnosis Chronic myeloid leukemia   08/20/2014 Tumor Marker BCR/ABL is undetectable   This patient was diagnosed with breast cancer in 1993. At the time she had lumpectomy and radiation therapy. I do not know the stage of her breast cancer but she has no evidence of recurrence. The patient was diagnosed with CML after routine blood work revealed leukocytosis. She was placed on Gleevec long-term. She has mild diarrhea on Gleevec.  INTERVAL HISTORY: Please see below for problem oriented charting. She is doing well. She denies side effects from [place]. She mild fatigue. Denies recent infection. The patient denies any recent signs or symptoms of bleeding such as spontaneous epistaxis, hematuria or hematochezia. She denies any recent abnormal breast examination, palpable mass, abnormal breast appearance or nipple changes  REVIEW OF SYSTEMS:   Constitutional: Denies fevers, chills or abnormal weight loss Eyes: Denies blurriness of vision Ears, nose, mouth, throat, and face: Denies mucositis or sore throat Respiratory: Denies cough, dyspnea or wheezes Cardiovascular: Denies palpitation, chest discomfort or lower extremity swelling Gastrointestinal:  Denies nausea, heartburn or change in bowel habits Skin: Denies abnormal skin rashes Lymphatics: Denies new lymphadenopathy or easy bruising Neurological:Denies numbness, tingling or new weaknesses Behavioral/Psych: Mood is stable, no new changes  All other systems were reviewed with the patient and are negative.  I have reviewed the past medical history, past surgical history, social history and family history with the patient and they are unchanged from previous note.  ALLERGIES:  is allergic to penicillins.  MEDICATIONS:  Current Outpatient  Prescriptions  Medication Sig Dispense Refill  . aspirin 81 MG chewable tablet Chew 81 mg by mouth daily.    Marland Kitchen CALCIUM-VITAMIN D PO Take 1 tablet by mouth 2 (two) times daily.     . diclofenac sodium (VOLTAREN) 1 % GEL Apply 1 g topically 4 (four) times daily.     Marland Kitchen ESTRACE VAGINAL 0.1 MG/GM vaginal cream     . folic acid (FOLVITE) 1 MG tablet TAKE 1 TABLET BY MOUTH EVERY DAY 90 tablet 0  . glucosamine-chondroitin 500-400 MG tablet Take 1 tablet by mouth 2 (two) times daily.    Marland Kitchen ibuprofen (ADVIL,MOTRIN) 200 MG tablet Take 200 mg by mouth every 6 (six) hours as needed for moderate pain.    Marland Kitchen imatinib (GLEEVEC) 100 MG tablet TAKE 3 TABLETS BY MOUTH EVERY DAY WITH MEALS AND A GLASS OF WATER 270 tablet 0  . Loperamide HCl (IMODIUM PO) Take by mouth.    . Multiple Vitamin (MULTIVITAMIN) tablet Take 1 tablet by mouth daily.    . Probiotic Product (ALIGN) 4 MG CAPS Take 1 capsule by mouth daily.     No current facility-administered medications for this visit.    PHYSICAL EXAMINATION: ECOG PERFORMANCE STATUS: 0 - Asymptomatic  Filed Vitals:   09/14/14 1110  BP: 123/66  Pulse: 77  Temp: 98.5 F (36.9 C)  Resp: 18   Filed Weights   09/14/14 1110  Weight: 125 lb 6.4 oz (56.881 kg)    GENERAL:alert, no distress and comfortable SKIN: skin color, texture, turgor are normal, no rashes or significant lesions EYES: normal, Conjunctiva are pink and non-injected, sclera clear OROPHARYNX:no exudate, no erythema and lips, buccal mucosa, and tongue normal  NECK: supple, thyroid normal size, non-tender, without nodularity LYMPH:  no palpable lymphadenopathy in the  cervical, axillary or inguinal LUNGS: clear to auscultation and percussion with normal breathing effort HEART: regular rate & rhythm and no murmurs and no lower extremity edema ABDOMEN:abdomen soft, non-tender and normal bowel sounds Musculoskeletal:no cyanosis of digits and no clubbing  NEURO: alert & oriented x 3 with fluent speech,  no focal motor/sensory deficits  LABORATORY DATA:  I have reviewed the data as listed    Component Value Date/Time   NA 140 08/20/2014 0811   NA 138 06/12/2013 0452   NA 135 09/13/2009 0924   K 4.5 08/20/2014 0811   K 3.8 06/12/2013 0452   K 4.3 09/13/2009 0924   CL 104 06/12/2013 0452   CL 105 09/04/2012 1006   CL 98 09/13/2009 0924   CO2 27 08/20/2014 0811   CO2 26 06/12/2013 0452   CO2 30 09/13/2009 0924   GLUCOSE 96 08/20/2014 0811   GLUCOSE 94 06/12/2013 0452   GLUCOSE 96 09/04/2012 1006   GLUCOSE 94 09/13/2009 0924   BUN 20.6 08/20/2014 0811   BUN 13 06/12/2013 0452   BUN 23* 09/13/2009 0924   CREATININE 0.8 08/20/2014 0811   CREATININE 0.79 06/12/2013 0452   CREATININE 1.0 09/13/2009 0924   CALCIUM 9.1 08/20/2014 0811   CALCIUM 7.9* 06/12/2013 0452   CALCIUM 9.3 09/13/2009 0924   PROT 6.2* 08/20/2014 0811   PROT 6.4 04/28/2013 1440   PROT 6.9 09/13/2009 0924   ALBUMIN 3.7 08/20/2014 0811   ALBUMIN 3.6 04/28/2013 1440   AST 35* 08/20/2014 0811   AST 37 04/28/2013 1440   AST 32 09/13/2009 0924   ALT 20 08/20/2014 0811   ALT 18 04/28/2013 1440   ALT 24 09/13/2009 0924   ALKPHOS 54 08/20/2014 0811   ALKPHOS 58 04/28/2013 1440   ALKPHOS 72 09/13/2009 0924   BILITOT 0.43 08/20/2014 0811   BILITOT 0.3 04/28/2013 1440   BILITOT 0.80 09/13/2009 0924   GFRNONAA 79* 06/12/2013 0452   GFRAA >90 06/12/2013 0452    No results found for: SPEP, UPEP  Lab Results  Component Value Date   WBC 2.9* 08/20/2014   NEUTROABS 1.1* 08/20/2014   HGB 10.9* 08/20/2014   HCT 32.3* 08/20/2014   MCV 113.6* 08/20/2014   PLT 138* 08/20/2014      Chemistry      Component Value Date/Time   NA 140 08/20/2014 0811   NA 138 06/12/2013 0452   NA 135 09/13/2009 0924   K 4.5 08/20/2014 0811   K 3.8 06/12/2013 0452   K 4.3 09/13/2009 0924   CL 104 06/12/2013 0452   CL 105 09/04/2012 1006   CL 98 09/13/2009 0924   CO2 27 08/20/2014 0811   CO2 26 06/12/2013 0452   CO2 30  09/13/2009 0924   BUN 20.6 08/20/2014 0811   BUN 13 06/12/2013 0452   BUN 23* 09/13/2009 0924   CREATININE 0.8 08/20/2014 0811   CREATININE 0.79 06/12/2013 0452   CREATININE 1.0 09/13/2009 0924      Component Value Date/Time   CALCIUM 9.1 08/20/2014 0811   CALCIUM 7.9* 06/12/2013 0452   CALCIUM 9.3 09/13/2009 0924   ALKPHOS 54 08/20/2014 0811   ALKPHOS 58 04/28/2013 1440   ALKPHOS 72 09/13/2009 0924   AST 35* 08/20/2014 0811   AST 37 04/28/2013 1440   AST 32 09/13/2009 0924   ALT 20 08/20/2014 0811   ALT 18 04/28/2013 1440   ALT 24 09/13/2009 0924   BILITOT 0.43 08/20/2014 0811   BILITOT 0.3 04/28/2013 1440  BILITOT 0.80 09/13/2009 0924      ASSESSMENT & PLAN:  Chronic myeloid leukemia Her last BCR/ABL showed she is in molecular remission.  The pancytopenia is likely related to chronic therapy with Gleevec. She is not symptomatic. She will continue on current dose of Gleevec at 300 milligrams daily and will see back in 6 months.   Thrombocytopenia due to drugs This is likely due to recent treatment. The patient denies recent history of bleeding such as epistaxis, hematuria or hematochezia. She is asymptomatic from the low platelet count. I will observe for now.  she does not require transfusion now. I will continue the chemotherapy at current dose without dosage adjustment.  If the thrombocytopenia gets progressive worse in the future, I might have to delay her treatment or adjust the chemotherapy dose.     Leukopenia due to antineoplastic chemotherapy This is likely due to recent treatment. The patient denies recent history of fevers, cough, chills, diarrhea or dysuria. She is asymptomatic from the leukopenia. I will observe for now.  I will continue the chemotherapy at current dose without dosage adjustment.  If the leukopenia gets progressive worse in the future, I might have to delay her treatment or adjust the chemotherapy dose.     Anemia in neoplastic  disease This is likely due to recent treatment. The patient denies recent history of bleeding such as epistaxis, hematuria or hematochezia. She is asymptomatic from the anemia. I will observe for now.  She does not require transfusion now. I will continue the chemotherapy at current dose without dosage adjustment.  If the anemia gets progressive worse in the future, I might have to delay her treatment or adjust the chemotherapy dose.    Breast cancer, right breast Her recent mammogram was negative for recurrence.    Orders Placed This Encounter  Procedures  . BCR-ABL    With RT-PCR technique    Standing Status: Future     Number of Occurrences:      Standing Expiration Date: 10/19/2015  . Comprehensive metabolic panel    Standing Status: Future     Number of Occurrences:      Standing Expiration Date: 10/19/2015  . CBC with Differential/Platelet    Standing Status: Future     Number of Occurrences:      Standing Expiration Date: 10/19/2015  . Lactate dehydrogenase    Standing Status: Future     Number of Occurrences:      Standing Expiration Date: 10/19/2015  . TSH    Standing Status: Future     Number of Occurrences:      Standing Expiration Date: 10/19/2015   All questions were answered. The patient knows to call the clinic with any problems, questions or concerns. No barriers to learning was detected. I spent 25 minutes counseling the patient face to face. The total time spent in the appointment was 30 minutes and more than 50% was on counseling and review of test results     [facility], [place], MD 09/14/2014 12:32 PM

## 2014-09-14 NOTE — Assessment & Plan Note (Signed)
This is likely due to recent treatment. The patient denies recent history of bleeding such as epistaxis, hematuria or hematochezia. She is asymptomatic from the low platelet count. I will observe for now.  she does not require transfusion now. I will continue the chemotherapy at current dose without dosage adjustment.  If the thrombocytopenia gets progressive worse in the future, I might have to delay her treatment or adjust the chemotherapy dose.   

## 2014-10-13 ENCOUNTER — Other Ambulatory Visit: Payer: Self-pay | Admitting: Hematology and Oncology

## 2014-10-22 ENCOUNTER — Other Ambulatory Visit: Payer: Self-pay | Admitting: Hematology and Oncology

## 2014-12-15 ENCOUNTER — Encounter: Payer: Self-pay | Admitting: Gastroenterology

## 2015-01-13 ENCOUNTER — Other Ambulatory Visit: Payer: Self-pay | Admitting: Hematology and Oncology

## 2015-01-15 ENCOUNTER — Other Ambulatory Visit: Payer: Self-pay | Admitting: Hematology and Oncology

## 2015-03-03 ENCOUNTER — Ambulatory Visit: Payer: Medicare Other | Admitting: Internal Medicine

## 2015-03-04 ENCOUNTER — Other Ambulatory Visit (HOSPITAL_BASED_OUTPATIENT_CLINIC_OR_DEPARTMENT_OTHER): Payer: Medicare Other

## 2015-03-04 ENCOUNTER — Other Ambulatory Visit: Payer: Medicare Other

## 2015-03-04 DIAGNOSIS — C9210 Chronic myeloid leukemia, BCR/ABL-positive, not having achieved remission: Secondary | ICD-10-CM

## 2015-03-04 DIAGNOSIS — R5383 Other fatigue: Secondary | ICD-10-CM

## 2015-03-04 LAB — CBC WITH DIFFERENTIAL/PLATELET
BASO%: 0.5 % (ref 0.0–2.0)
Basophils Absolute: 0 10*3/uL (ref 0.0–0.1)
EOS%: 1.8 % (ref 0.0–7.0)
Eosinophils Absolute: 0.1 10*3/uL (ref 0.0–0.5)
HEMATOCRIT: 33.2 % — AB (ref 34.8–46.6)
HGB: 11 g/dL — ABNORMAL LOW (ref 11.6–15.9)
LYMPH#: 1.1 10*3/uL (ref 0.9–3.3)
LYMPH%: 27.8 % (ref 14.0–49.7)
MCH: 38.4 pg — ABNORMAL HIGH (ref 25.1–34.0)
MCHC: 33.3 g/dL (ref 31.5–36.0)
MCV: 115.4 fL — ABNORMAL HIGH (ref 79.5–101.0)
MONO#: 0.5 10*3/uL (ref 0.1–0.9)
MONO%: 11.3 % (ref 0.0–14.0)
NEUT%: 58.6 % (ref 38.4–76.8)
NEUTROS ABS: 2.4 10*3/uL (ref 1.5–6.5)
PLATELETS: 130 10*3/uL — AB (ref 145–400)
RBC: 2.87 10*6/uL — AB (ref 3.70–5.45)
RDW: 13.8 % (ref 11.2–14.5)
WBC: 4 10*3/uL (ref 3.9–10.3)

## 2015-03-04 LAB — TSH CHCC: TSH: 0.742 m[IU]/L (ref 0.308–3.960)

## 2015-03-04 LAB — COMPREHENSIVE METABOLIC PANEL (CC13)
ALBUMIN: 3.9 g/dL (ref 3.5–5.0)
ALK PHOS: 51 U/L (ref 40–150)
ALT: 20 U/L (ref 0–55)
AST: 34 U/L (ref 5–34)
Anion Gap: 7 mEq/L (ref 3–11)
BUN: 23.4 mg/dL (ref 7.0–26.0)
CALCIUM: 9.7 mg/dL (ref 8.4–10.4)
CO2: 27 mEq/L (ref 22–29)
Chloride: 108 mEq/L (ref 98–109)
Creatinine: 1 mg/dL (ref 0.6–1.1)
EGFR: 54 mL/min/{1.73_m2} — AB (ref >90)
Glucose: 124 mg/dl (ref 70–140)
POTASSIUM: 4.3 meq/L (ref 3.5–5.1)
Sodium: 143 mEq/L (ref 136–145)
Total Bilirubin: 0.68 mg/dL (ref 0.20–1.20)
Total Protein: 6.2 g/dL — ABNORMAL LOW (ref 6.4–8.3)

## 2015-03-04 LAB — LACTATE DEHYDROGENASE (CC13): LDH: 259 U/L — ABNORMAL HIGH (ref 125–245)

## 2015-03-11 ENCOUNTER — Telehealth: Payer: Self-pay | Admitting: Hematology and Oncology

## 2015-03-11 ENCOUNTER — Encounter: Payer: Self-pay | Admitting: Hematology and Oncology

## 2015-03-11 ENCOUNTER — Ambulatory Visit (HOSPITAL_BASED_OUTPATIENT_CLINIC_OR_DEPARTMENT_OTHER): Payer: Medicare Other | Admitting: Hematology and Oncology

## 2015-03-11 VITALS — BP 122/67 | HR 79 | Temp 98.1°F | Resp 17 | Ht 61.5 in | Wt 126.6 lb

## 2015-03-11 DIAGNOSIS — C9210 Chronic myeloid leukemia, BCR/ABL-positive, not having achieved remission: Secondary | ICD-10-CM

## 2015-03-11 DIAGNOSIS — T451X5A Adverse effect of antineoplastic and immunosuppressive drugs, initial encounter: Secondary | ICD-10-CM

## 2015-03-11 DIAGNOSIS — D61810 Antineoplastic chemotherapy induced pancytopenia: Secondary | ICD-10-CM

## 2015-03-11 DIAGNOSIS — Z853 Personal history of malignant neoplasm of breast: Secondary | ICD-10-CM | POA: Diagnosis not present

## 2015-03-11 MED ORDER — IMATINIB MESYLATE 100 MG PO TABS: ORAL_TABLET | ORAL | Status: DC

## 2015-03-11 NOTE — Assessment & Plan Note (Signed)
Her last BCR/ABL showed she is in molecular remission.  The pancytopenia is likely related to chronic therapy with Gleevec. She is not symptomatic. She will continue on current dose of Gleevec at 300 milligrams daily and will see back in 6 months. 

## 2015-03-11 NOTE — Assessment & Plan Note (Signed)
This is likely due to recent treatment. The patient denies recent history of bleeding such as epistaxis, hematuria or hematochezia. She is asymptomatic from the anemia & thrombocytopenia. I will observe for now. I will continue the chemotherapy at current dose without dosage adjustment.  If the anemia gets progressive worse in the future, I might have to delay her treatment or adjust the chemotherapy dose.

## 2015-03-11 NOTE — Progress Notes (Signed)
[place] OFFICE PROGRESS NOTE  Patient Care Team: Hendricks Limes, MD as PCP - General  SUMMARY OF ONCOLOGIC HISTORY:   Chronic myeloid leukemia ([name] [name])   02/18/2007 Initial Diagnosis Chronic myeloid leukemia   08/20/2014 Tumor Marker BCR/ABL is undetectable   03/04/2015 Pathology Results BCR/ABL is undetectable    This patient was diagnosed with breast cancer in 1993. At the time she had lumpectomy and radiation therapy. I do not know the stage of her breast cancer but she has no evidence of recurrence. The patient was diagnosed with CML after routine blood work revealed leukocytosis. She was placed on Gleevec long-term. She has mild diarrhea on Gleevec.  INTERVAL HISTORY: Please see below for problem oriented charting. She is doing well. She denies side effects from [place]. She mild fatigue. Denies recent infection. The patient denies any recent signs or symptoms of bleeding such as spontaneous epistaxis, hematuria or hematochezia. She denies any recent abnormal breast examination, palpable mass, abnormal breast appearance or nipple changes  REVIEW OF SYSTEMS:   Constitutional: Denies fevers, chills or abnormal weight loss Eyes: Denies blurriness of vision Ears, nose, mouth, throat, and face: Denies mucositis or sore throat Respiratory: Denies cough, dyspnea or wheezes Cardiovascular: Denies palpitation, chest discomfort or lower extremity swelling Gastrointestinal:  Denies nausea, heartburn or change in bowel habits Skin: Denies abnormal skin rashes Lymphatics: Denies new lymphadenopathy or easy bruising Neurological:Denies numbness, tingling or new weaknesses Behavioral/Psych: Mood is stable, no new changes  All other systems were reviewed with the patient and are negative.  I have reviewed the past medical history, past surgical history, social history and family history with the patient and they are unchanged from previous note.  ALLERGIES:  is  allergic to penicillins.  MEDICATIONS:  Current Outpatient Prescriptions  Medication Sig Dispense Refill  . aspirin 81 MG chewable tablet Chew 81 mg by mouth daily.    Marland Kitchen CALCIUM-VITAMIN D PO Take 1 tablet by mouth 2 (two) times daily.     . diclofenac sodium (VOLTAREN) 1 % GEL Apply 1 g topically 4 (four) times daily.     Marland Kitchen ESTRACE VAGINAL 0.1 MG/GM vaginal cream     . folic acid (FOLVITE) 1 MG tablet TAKE 1 TABLET BY MOUTH EVERY DAY 90 tablet 0  . glucosamine-chondroitin 500-400 MG tablet Take 1 tablet by mouth 2 (two) times daily.    Marland Kitchen ibuprofen (ADVIL,MOTRIN) 200 MG tablet Take 200 mg by mouth every 6 (six) hours as needed for moderate pain.    Marland Kitchen imatinib (GLEEVEC) 100 MG tablet TAKE 3 TABLETS BY MOUTH EVERY DAY WITH MEALS AND A GLASS OF WATER 270 tablet 3  . Loperamide HCl (IMODIUM PO) Take by mouth.    . Multiple Vitamin (MULTIVITAMIN) tablet Take 1 tablet by mouth daily.    . predniSONE (DELTASONE) 10 MG tablet Take 10 mg by mouth daily.  0  . Probiotic Product (ALIGN) 4 MG CAPS Take 1 capsule by mouth daily.     No current facility-administered medications for this visit.    PHYSICAL EXAMINATION: ECOG PERFORMANCE STATUS: 0 - Asymptomatic  Filed Vitals:   03/11/15 1120  BP: 122/67  Pulse: 79  Temp: 98.1 F (36.7 C)  Resp: 17   Filed Weights   03/11/15 1120  Weight: 126 lb 9.6 oz (57.425 kg)    GENERAL:alert, no distress and comfortable SKIN: skin color, texture, turgor are normal, no rashes or significant lesions EYES: normal, Conjunctiva are pink and non-injected, sclera clear OROPHARYNX:no exudate,  no erythema and lips, buccal mucosa, and tongue normal  NECK: supple, thyroid normal size, non-tender, without nodularity LYMPH:  no palpable lymphadenopathy in the cervical, axillary or inguinal LUNGS: clear to auscultation and percussion with normal breathing effort HEART: regular rate & rhythm and no murmurs and no lower extremity edema ABDOMEN:abdomen soft,  non-tender and normal bowel sounds Musculoskeletal:no cyanosis of digits and no clubbing  NEURO: alert & oriented x 3 with fluent speech, no focal motor/sensory deficits  LABORATORY DATA:  I have reviewed the data as listed    Component Value Date/Time   NA 143 03/04/2015 1033   NA 138 06/12/2013 0452   NA 135 09/13/2009 0924   K 4.3 03/04/2015 1033   K 3.8 06/12/2013 0452   K 4.3 09/13/2009 0924   CL 104 06/12/2013 0452   CL 105 09/04/2012 1006   CL 98 09/13/2009 0924   CO2 27 03/04/2015 1033   CO2 26 06/12/2013 0452   CO2 30 09/13/2009 0924   GLUCOSE 124 03/04/2015 1033   GLUCOSE 94 06/12/2013 0452   GLUCOSE 96 09/04/2012 1006   GLUCOSE 94 09/13/2009 0924   BUN 23.4 03/04/2015 1033   BUN 13 06/12/2013 0452   BUN 23* 09/13/2009 0924   CREATININE 1.0 03/04/2015 1033   CREATININE 0.79 06/12/2013 0452   CREATININE 1.0 09/13/2009 0924   CALCIUM 9.7 03/04/2015 1033   CALCIUM 7.9* 06/12/2013 0452   CALCIUM 9.3 09/13/2009 0924   PROT 6.2* 03/04/2015 1033   PROT 6.4 04/28/2013 1440   PROT 6.9 09/13/2009 0924   ALBUMIN 3.9 03/04/2015 1033   ALBUMIN 3.6 04/28/2013 1440   ALBUMIN 4.0 09/13/2009 0924   AST 34 03/04/2015 1033   AST 37 04/28/2013 1440   AST 32 09/13/2009 0924   ALT 20 03/04/2015 1033   ALT 18 04/28/2013 1440   ALT 24 09/13/2009 0924   ALKPHOS 51 03/04/2015 1033   ALKPHOS 58 04/28/2013 1440   ALKPHOS 72 09/13/2009 0924   BILITOT 0.68 03/04/2015 1033   BILITOT 0.3 04/28/2013 1440   BILITOT 0.80 09/13/2009 0924   GFRNONAA 79* 06/12/2013 0452   GFRAA >90 06/12/2013 0452    No results found for: SPEP, UPEP  Lab Results  Component Value Date   WBC 4.0 03/04/2015   NEUTROABS 2.4 03/04/2015   HGB 11.0* 03/04/2015   HCT 33.2* 03/04/2015   MCV 115.4* 03/04/2015   PLT 130* 03/04/2015      Chemistry      Component Value Date/Time   NA 143 03/04/2015 1033   NA 138 06/12/2013 0452   NA 135 09/13/2009 0924   K 4.3 03/04/2015 1033   K 3.8 06/12/2013 0452    K 4.3 09/13/2009 0924   CL 104 06/12/2013 0452   CL 105 09/04/2012 1006   CL 98 09/13/2009 0924   CO2 27 03/04/2015 1033   CO2 26 06/12/2013 0452   CO2 30 09/13/2009 0924   BUN 23.4 03/04/2015 1033   BUN 13 06/12/2013 0452   BUN 23* 09/13/2009 0924   CREATININE 1.0 03/04/2015 1033   CREATININE 0.79 06/12/2013 0452   CREATININE 1.0 09/13/2009 0924      Component Value Date/Time   CALCIUM 9.7 03/04/2015 1033   CALCIUM 7.9* 06/12/2013 0452   CALCIUM 9.3 09/13/2009 0924   ALKPHOS 51 03/04/2015 1033   ALKPHOS 58 04/28/2013 1440   ALKPHOS 72 09/13/2009 0924   AST 34 03/04/2015 1033   AST 37 04/28/2013 1440   AST 32 09/13/2009 0924  ALT 20 03/04/2015 1033   ALT 18 04/28/2013 1440   ALT 24 09/13/2009 0924   BILITOT 0.68 03/04/2015 1033   BILITOT 0.3 04/28/2013 1440   BILITOT 0.80 09/13/2009 0924      ASSESSMENT & PLAN:  Chronic myeloid leukemia Her last BCR/ABL showed she is in molecular remission.  The pancytopenia is likely related to chronic therapy with Gleevec. She is not symptomatic. She will continue on current dose of Gleevec at 300 milligrams daily and will see back in 6 months.  History of breast cancer Her recent mammogram was negative for recurrence.    Pancytopenia due to antineoplastic chemotherapy [facility]) This is likely due to recent treatment. The patient denies recent history of bleeding such as epistaxis, hematuria or hematochezia. She is asymptomatic from the anemia & thrombocytopenia. I will observe for now. I will continue the chemotherapy at current dose without dosage adjustment.  If the anemia gets progressive worse in the future, I might have to delay her treatment or adjust the chemotherapy dose.    Orders Placed This Encounter  Procedures  . CBC with Differential/Platelet    Standing Status: Future     Number of Occurrences:      Standing Expiration Date: 04/14/2016  . Comprehensive metabolic panel    Standing Status: Future     Number of  Occurrences:      Standing Expiration Date: 04/14/2016  . BCR-ABL    With RT-PCR technique    Standing Status: Future     Number of Occurrences:      Standing Expiration Date: 04/14/2016  . BCR-ABL   All questions were answered. The patient knows to call the clinic with any problems, questions or concerns. No barriers to learning was detected. I spent 15 minutes counseling the patient face to face. The total time spent in the appointment was 20 minutes and more than 50% was on counseling and review of test results     [facility], [place], MD 03/11/2015 3:03 PM

## 2015-03-11 NOTE — Assessment & Plan Note (Signed)
Her recent mammogram was negative for recurrence. 

## 2015-03-11 NOTE — Telephone Encounter (Signed)
Gave pt appts for 09/05/15 @ 11 and 09/12/15 @ 12pm.

## 2015-03-11 NOTE — Telephone Encounter (Signed)
Returned patient call re moving May 2017 appointments to one week prior. Gave patient new appointments for 5/1 and 5/8.

## 2015-04-07 ENCOUNTER — Encounter: Payer: Self-pay | Admitting: Hematology and Oncology

## 2015-04-07 DIAGNOSIS — R5383 Other fatigue: Secondary | ICD-10-CM | POA: Insufficient documentation

## 2015-04-07 NOTE — Progress Notes (Signed)
The patient complained of fatigue I will order TSH to exclude hypothyroidism

## 2015-04-19 ENCOUNTER — Other Ambulatory Visit: Payer: Self-pay | Admitting: Hematology and Oncology

## 2015-05-10 ENCOUNTER — Encounter: Payer: Self-pay | Admitting: Internal Medicine

## 2015-05-10 ENCOUNTER — Ambulatory Visit (INDEPENDENT_AMBULATORY_CARE_PROVIDER_SITE_OTHER): Payer: Medicare Other | Admitting: Internal Medicine

## 2015-05-10 ENCOUNTER — Telehealth: Payer: Self-pay | Admitting: Internal Medicine

## 2015-05-10 VITALS — BP 120/78 | HR 65 | Temp 97.9°F | Resp 16 | Wt 128.0 lb

## 2015-05-10 DIAGNOSIS — H9191 Unspecified hearing loss, right ear: Secondary | ICD-10-CM | POA: Diagnosis not present

## 2015-05-10 DIAGNOSIS — M79671 Pain in right foot: Secondary | ICD-10-CM | POA: Diagnosis not present

## 2015-05-10 NOTE — Progress Notes (Signed)
Subjective:    Patient ID: [name] [name], female    DOB: 05/13/1934, 81 y.o.   MRN: [id number]  HPI She is here because of decreased hearing in her right ear.  Last summer she experiences symptoms in her right ear. After going swimming she felt like she had water in her ear. The symptoms would resolve after a couple of days. She discussed this with her previous primary care physician and she advised that most likely she had wax in the ear and the water trapped in the ear, but evaporate after a few days.  4-5 days ago she experienced decreased hearing on the right ear. She does use Q-tips daily. She denies any pain in the right ear and has not had any discharge. She denies cold symptoms. She denies any pain, change in hearing or discharge from left ear. She did try using an over-the-counter earwax removal and there is no improvement.  Right foot pain: She walks regularly and does not have any pain with walking, but has been experiencing some pain in her right foot on the medial aspect near her first toe at rest. She states it feels like nerve pain. She denies any swelling, redness and pain is intermittent.  Medications and allergies reviewed with patient and updated if appropriate.  Patient Active Problem List   Diagnosis Date Noted  . Other fatigue 04/07/2015  . AAA (abdominal aortic aneurysm) without rupture ([place]) 08/07/2014  . History of colonic polyps 08/06/2014  . Thrombocytopenia due to drugs 02/18/2014  . Leukopenia due to antineoplastic chemotherapy 02/18/2014  . 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 Other abnormal glucose 07/30/2012  . History of breast cancer 07/18/2012  . Osteopenia 09/15/2011  . Diarrhea 08/29/2011  . Family history of malignant neoplasm of gastrointestinal tract 08/29/2011  . HYPERLIPIDEMIA 04/14/2009  . Pancytopenia due to antineoplastic chemotherapy ([place]) 04/14/2009  . VISUAL IMPAIRMENT, TRANSIENT 04/14/2009  . RECTOVAGINAL FISTULA 11/10/2008  .  DIVERTICULOSIS OF COLON 04/06/2008  . Chronic myeloid leukemia ([place]) 02/18/2007  . ROSACEA 02/18/2007    Current Outpatient Prescriptions on File Prior to Visit  Medication Sig Dispense Refill  . diclofenac sodium (VOLTAREN) 1 % GEL Apply 1 g topically 4 (four) times daily.     Marland Kitchen ESTRACE VAGINAL 0.1 MG/GM vaginal cream     . folic acid (FOLVITE) 1 MG tablet TAKE 1 TABLET BY MOUTH EVERY DAY 90 tablet 0  . glucosamine-chondroitin 500-400 MG tablet Take 1 tablet by mouth 2 (two) times daily.    Marland Kitchen ibuprofen (ADVIL,MOTRIN) 200 MG tablet Take 200 mg by mouth every 6 (six) hours as needed for moderate pain.    Marland Kitchen imatinib (GLEEVEC) 100 MG tablet TAKE 3 TABLETS BY MOUTH EVERY DAY WITH MEALS AND A GLASS OF WATER 270 tablet 3  . Loperamide HCl (IMODIUM PO) Take by mouth.    . Multiple Vitamin (MULTIVITAMIN) tablet Take 1 tablet by mouth daily.    . Probiotic Product (ALIGN) 4 MG CAPS Take 1 capsule by mouth daily.     No current facility-administered medications on file prior to visit.    Past Medical History  Diagnosis Date  . Arthritis     DJD  . Breast cancer ([place]) 1993    S/P lumpectomy , radiation, & Tamoxifen  . CML (chronic myeloid leukemia) ([place])     Dr Alvy Bimler  . Anemia requiring transfusions 2010 & 2015    Past Surgical History  Procedure Laterality Date  . Knee surgery  mid 80's  Arthroscopy, right knee   . Breast surgery      Right lumpectomy, s/p radiation 1993 and oral  Tamoxifen x 5 years ([id number])   . Colonoscopy  03/2003, last 2010    diverticulosis, Dr.Kaplan  . Sigmoid colon resection  2010     Dr Arlyce Dice, [place] for fistula & diverticulitis  . Ileostomy reversal  2011    3 mos after above  . Bilateral salpingoophorectomy  2010    with bowel resection  . Gastrocslide      left leg; Dr Beola Cord  . Abdominal hysterectomy  ~1989    no BSO, for Dysplasia   . Appendectomy  ~1989  . Leg tendon surgery Left 2013    "cut on leg and fixed something"  . Trigger  finger release Left 2013    thumb  . Excision morton's neuroma Left mid 80's    foot  . Total knee arthroplasty Right 06/11/2013    Procedure: RIGHT TOTAL KNEE ARTHROPLASTY;  Surgeon: Sydnee Cabal, MD;  Location: [facility] ORS;  Service: Orthopedics;  Laterality: Right;  . Cataract extraction  06/08/2014    left eye;Dr Herbert Deaner    Social History   Social History  . Marital Status: Married    Spouse Name: N/A  . Number of Children: N/A  . Years of Education: N/A   Social History Main Topics  . Smoking status: Former Smoker -- 5 years    Types: Cigarettes    Quit date: 04/30/1962  . Smokeless tobacco: Never Used     Comment: smoked 1956-1964, up to 1/2 ppd  . Alcohol Use: 4.2 - 8.4 oz/week    7-14 Glasses of wine per week  . Drug Use: No  . Sexual Activity: Yes   Other Topics Concern  . Not on file   Social History Narrative    Family History  Problem Relation Age of Onset  . Colon cancer Mother 43  . Uterine cancer Sister   . Diabetes Cousin   . Cancer Father     Lip cancer  . Breast cancer Paternal Aunt   . Heart attack Paternal Uncle 70  . Stroke Neg Hx   . Esophageal cancer Neg Hx   . Rectal cancer Neg Hx   . Stomach cancer Neg Hx   . Aneurysm Father     AAA;S/P surgery    Review of Systems  Constitutional: Negative for fever and chills.  HENT: Negative for congestion, ear discharge, ear pain, sinus pressure and sore throat.   Respiratory: Negative for cough, shortness of breath and wheezing.   Neurological: Negative for dizziness, light-headedness and headaches.       Objective:   Filed Vitals:   05/10/15 1627  BP: 120/78  Pulse: 65  Temp: 97.9 F (36.6 C)  Resp: 16   Filed Weights   05/10/15 1627  Weight: 128 lb (58.06 kg)   Body mass index is 23.8 kg/(m^2).   Physical Exam  Constitutional: She appears well-developed and well-nourished. No distress.  HENT:  Head: Normocephalic and atraumatic.  Right Ear: External ear normal.  Left Ear:  External ear normal.  Nose: Nose normal.  Mouth/Throat: Oropharynx is clear and moist. No oropharyngeal exudate.  Left ear canal and tympanic membrane normal, right ear canal obstructed with ear cerumen  Neck: Neck supple. No tracheal deviation present. No thyromegaly present.  Musculoskeletal:  Right foot without tenderness with palpating the area where she has been experiencing pain, which is near the MTP of her first toe, no  swelling, no deformity  Lymphadenopathy:    She has no cervical adenopathy.  Skin: She is not diaphoretic.          Assessment & Plan:   Decreased hearing right ear Related to cerumen that is obstructing the right ear canal No concerning symptoms or signs on exam Ear lavage performed by CMA Earwax removed and hearing improved We'll monitor earwax at her routine visits. I also discussed using over-the-counter earwax removal kit intermittently  Right foot pain Sounds like nerve pain For now we will monitor-we will discuss at her appointment in April consider referral to podiatrist if she is still experiencing pain

## 2015-05-10 NOTE — Progress Notes (Signed)
Pre visit review using our clinic review tool, if applicable. No additional management support is needed unless otherwise documented below in the visit note. 

## 2015-05-10 NOTE — Telephone Encounter (Signed)
Patient Name: [name] [name]  DOB: March 23, 1935    Initial Comment caller states she has a ball of wax in her ear   Nurse Assessment  Nurse: Mallie Mussel, RN, Alveta Heimlich Date/Time Eilene Ghazi Time): 05/10/2015 11:24:21 AM  Confirm and document reason for call. If symptomatic, describe symptoms. ---Caller states that she has a ball of wax in her left ear discovered by a nurse last week. The nurse did not try to remove it. She tried one of the Debrox kit and it didn't work. Denies pain, but she does have diminished hearing.  Has the patient traveled out of the country within the last 30 days? ---No  Does the patient have any new or worsening symptoms? ---Yes  Will a triage be completed? ---Yes  Related visit to physician within the last 2 weeks? ---No  Does the PT have any chronic conditions? (i.e. diabetes, asthma, etc.) ---Yes  List chronic conditions. ---CML in remission  Is this a behavioral health or substance abuse call? ---No     Guidelines    Guideline Title Affirmed Question Affirmed Notes  Earwax Normal earwax, questions about    Final Disposition User   [place], RN, Alveta Heimlich    Comments  I spoke with Corrine who states that they have stuiff there to remove ear wax, so they can try. If they can't, they may send her to a specialist.  Caller states that she has an appointment for Thursday, but she would like to be seen today if she can. She states that she still wants to see Dr. Quay Burow also. Appointment scheduled for today at 4:00pm with Dr. Billey Gosling.   Disagree/Comply: Disagree  Disagree/Comply Reason: Disagree with instructions

## 2015-05-10 NOTE — Patient Instructions (Signed)
Your right ear was cleaned out.  If you experience any pain or discomfort in the ear please call me.    If your foot pain worsens before your next appointment please let me know.

## 2015-05-12 ENCOUNTER — Ambulatory Visit: Payer: Medicare Other | Admitting: Internal Medicine

## 2015-06-17 LAB — HM MAMMOGRAPHY

## 2015-06-18 ENCOUNTER — Encounter: Payer: Self-pay | Admitting: Internal Medicine

## 2015-07-07 ENCOUNTER — Ambulatory Visit (INDEPENDENT_AMBULATORY_CARE_PROVIDER_SITE_OTHER): Payer: Medicare Other | Admitting: Family Medicine

## 2015-07-07 ENCOUNTER — Encounter: Payer: Self-pay | Admitting: Family Medicine

## 2015-07-07 VITALS — BP 120/80 | HR 86 | Temp 98.4°F | Wt 131.0 lb

## 2015-07-07 DIAGNOSIS — J0140 Acute pansinusitis, unspecified: Secondary | ICD-10-CM | POA: Diagnosis not present

## 2015-07-07 MED ORDER — DOXYCYCLINE HYCLATE 100 MG PO TABS: 100.0000 mg | ORAL_TABLET | Freq: Two times a day (BID) | ORAL | Status: DC

## 2015-07-07 NOTE — Patient Instructions (Signed)
Please take medication as prescribed and return to clinic if your symptoms do not improve in 3-4 days, worsen, or you develop a fever >101. Rest, increase fluids, and you may use Mucinex as needed.  Sinusitis, Adult Sinusitis is redness, soreness, and inflammation of the paranasal sinuses. Paranasal sinuses are air pockets within the bones of your face. They are located beneath your eyes, in the middle of your forehead, and above your eyes. In healthy paranasal sinuses, mucus is able to drain out, and air is able to circulate through them by way of your nose. However, when your paranasal sinuses are inflamed, mucus and air can become trapped. This can allow bacteria and other germs to grow and cause infection. Sinusitis can develop quickly and last only a short time (acute) or continue over a long period (chronic). Sinusitis that lasts for more than 12 weeks is considered chronic. CAUSES Causes of sinusitis include:  Allergies.  Structural abnormalities, such as displacement of the cartilage that separates your nostrils (deviated septum), which can decrease the air flow through your nose and sinuses and affect sinus drainage.  Functional abnormalities, such as when the small hairs (cilia) that line your sinuses and help remove mucus do not work properly or are not present. SIGNS AND SYMPTOMS Symptoms of acute and chronic sinusitis are the same. The primary symptoms are pain and pressure around the affected sinuses. Other symptoms include:  Upper toothache.  Earache.  Headache.  Bad breath.  Decreased sense of smell and taste.  A cough, which worsens when you are lying flat.  Fatigue.  Fever.  Thick drainage from your nose, which often is green and may contain pus (purulent).  Swelling and warmth over the affected sinuses. DIAGNOSIS Your health care provider will perform a physical exam. During your exam, your health care provider may perform any of the following to help determine  if you have acute sinusitis or chronic sinusitis:  Look in your nose for signs of abnormal growths in your nostrils (nasal polyps).  Tap over the affected sinus to check for signs of infection.  View the inside of your sinuses using an imaging device that has a light attached (endoscope). If your health care provider suspects that you have chronic sinusitis, one or more of the following tests may be recommended:  Allergy tests.  Nasal culture. A sample of mucus is taken from your nose, sent to a lab, and screened for bacteria.  Nasal cytology. A sample of mucus is taken from your nose and examined by your health care provider to determine if your sinusitis is related to an allergy. TREATMENT Most cases of acute sinusitis are related to a viral infection and will resolve on their own within 10 days. Sometimes, medicines are prescribed to help relieve symptoms of both acute and chronic sinusitis. These may include pain medicines, decongestants, nasal steroid sprays, or saline sprays. However, for sinusitis related to a bacterial infection, your health care provider will prescribe antibiotic medicines. These are medicines that will help kill the bacteria causing the infection. Rarely, sinusitis is caused by a fungal infection. In these cases, your health care provider will prescribe antifungal medicine. For some cases of chronic sinusitis, surgery is needed. Generally, these are cases in which sinusitis recurs more than 3 times per year, despite other treatments. HOME CARE INSTRUCTIONS  Drink plenty of water. Water helps thin the mucus so your sinuses can drain more easily.  Use a humidifier.  Inhale steam 3-4 times a day (for example,  sit in the bathroom with the shower running).  Apply a warm, moist washcloth to your face 3-4 times a day, or as directed by your health care provider.  Use saline nasal sprays to help moisten and clean your sinuses.  Take medicines only as directed by your  health care provider.  If you were prescribed either an antibiotic or antifungal medicine, finish it all even if you start to feel better. SEEK IMMEDIATE MEDICAL CARE IF:  You have increasing pain or severe headaches.  You have nausea, vomiting, or drowsiness.  You have swelling around your face.  You have vision problems.  You have a stiff neck.  You have difficulty breathing.   This information is not intended to replace advice given to you by your health care provider. Make sure you discuss any questions you have with your health care provider.   Document Released: 04/16/2005 Document Revised: 05/07/2014 Document Reviewed: 05/01/2011 Elsevier Interactive Patient Education [employer].

## 2015-07-07 NOTE — Progress Notes (Signed)
Pre visit review using our clinic review tool, if applicable. No additional management support is needed unless otherwise documented below in the visit note. 

## 2015-07-07 NOTE — Progress Notes (Signed)
Subjective:    Patient ID: [name] [name], female    DOB: 09-Jun-1934, 81 y.o.   MRN: [id number]  HPI  Ms. Tayloe is a 81 year old female who presents today with sinus pressure/discomfort, PND,congestion, and hyponasal speech for one month.  She denies fever, chills, sweats, ear pain, dental pain, and recent antibiotic use. Pertinent history of a trip to [place] for 3 weeks in the fall after which she developed sinusitis that was treated for 10 days. Over the past month, she noted rhinitis and sinus pressure/pain. Treatment at home with saline rinses with minimal benefit. Pertinent history includes chronic myeloid leukemia and she is followed by Dr. Alvy Bimler every 6 months and is currently taking Gleevec. Recent sick contact is noted and she denies recent antibiotic use.  Review of Systems  Constitutional: Negative for fever, chills and fatigue.  HENT: Positive for congestion, postnasal drip, rhinorrhea and sinus pressure. Negative for ear pain, hearing loss, sneezing and sore throat.   Eyes:       Denies itchy, watery eyes.  Respiratory: Negative for cough, chest tightness, shortness of breath and wheezing.   Cardiovascular: Negative for chest pain, palpitations and leg swelling.  Gastrointestinal: Negative for nausea, vomiting, abdominal pain, diarrhea and constipation.  Genitourinary: Negative for dysuria, urgency and hematuria.  Skin: Negative for rash.  Allergic/Immunologic: Negative for environmental allergies.  Neurological: Negative for dizziness, light-headedness and headaches.   Past Medical History  Diagnosis Date  . Arthritis     DJD  . Breast cancer ([place]) 1993    S/P lumpectomy , radiation, & Tamoxifen  . CML (chronic myeloid leukemia) ([place])     Dr Alvy Bimler  . Anemia requiring transfusions 2010 & 2015    Social History   Social History  . Marital Status: Married    Spouse Name: N/A  . Number of Children: N/A  . Years of Education: N/A   Occupational History  .  Not on file.   Social History Main Topics  . Smoking status: Former Smoker -- 5 years    Types: Cigarettes    Quit date: 04/30/1962  . Smokeless tobacco: Never Used     Comment: smoked 1956-1964, up to 1/2 ppd  . Alcohol Use: 4.2 - 8.4 oz/week    7-14 Glasses of wine per week  . Drug Use: No  . Sexual Activity: Yes   Other Topics Concern  . Not on file   Social History Narrative    Past Surgical History  Procedure Laterality Date  . Knee surgery  mid 80's    Arthroscopy, right knee   . Breast surgery      Right lumpectomy, s/p radiation 1993 and oral  Tamoxifen x 5 years [id number]   . Colonoscopy  03/2003, last 2010    diverticulosis, Dr.Kaplan  . Sigmoid colon resection  2010     Dr Arlyce Dice, [place] for fistula & diverticulitis  . Ileostomy reversal  2011    3 mos after above  . Bilateral salpingoophorectomy  2010    with bowel resection  . Gastrocslide      left leg; Dr Beola Cord  . Abdominal hysterectomy  ~1989    no BSO, for Dysplasia   . Appendectomy  ~1989  . Leg tendon surgery Left 2013    "cut on leg and fixed something"  . Trigger finger release Left 2013    thumb  . Excision morton's neuroma Left mid 80's    foot  . Total knee arthroplasty Right 06/11/2013  Procedure: RIGHT TOTAL KNEE ARTHROPLASTY;  Surgeon: Sydnee Cabal, MD;  Location: [facility];  Service: Orthopedics;  Laterality: Right;  . Cataract extraction  06/08/2014    left eye;Dr Herbert Deaner    Family History  Problem Relation Age of Onset  . Colon cancer Mother 66  . Uterine cancer Sister   . Diabetes Cousin   . Cancer Father     Lip cancer  . Breast cancer Paternal Aunt   . Heart attack Paternal Uncle 7  . Stroke Neg Hx   . Esophageal cancer Neg Hx   . Rectal cancer Neg Hx   . Stomach cancer Neg Hx   . Aneurysm Father     AAA;S/P surgery    Allergies  Allergen Reactions  . Penicillins     Hives Because of a history of documented adverse serious drug reaction;Medi Alert bracelet   is recommended    Current Outpatient Prescriptions on File Prior to Visit  Medication Sig Dispense Refill  . aspirin 81 MG tablet Take 81 mg by mouth daily.    . diclofenac sodium (VOLTAREN) 1 % GEL Apply 1 g topically 4 (four) times daily.     Marland Kitchen ESTRACE VAGINAL 0.1 MG/GM vaginal cream     . folic acid (FOLVITE) 1 MG tablet TAKE 1 TABLET BY MOUTH EVERY DAY 90 tablet 0  . glucosamine-chondroitin 500-400 MG tablet Take 1 tablet by mouth 2 (two) times daily.    Marland Kitchen ibuprofen (ADVIL,MOTRIN) 200 MG tablet Take 200 mg by mouth every 6 (six) hours as needed for moderate pain.    Marland Kitchen imatinib (GLEEVEC) 100 MG tablet TAKE 3 TABLETS BY MOUTH EVERY DAY WITH MEALS AND A GLASS OF WATER 270 tablet 3  . Loperamide HCl (IMODIUM PO) Take by mouth.    . Multiple Vitamin (MULTIVITAMIN) tablet Take 1 tablet by mouth daily.    . Probiotic Product (ALIGN) 4 MG CAPS Take 1 capsule by mouth daily.     No current facility-administered medications on file prior to visit.    BP 120/80 mmHg  Pulse 86  Temp(Src) 98.4 F (36.9 C) (Oral)  Wt 131 lb (59.421 kg)     Objective:   Physical Exam  Constitutional: She is oriented to person, place, and time. She appears well-developed and well-nourished.  HENT:  Right Ear: Tympanic membrane normal.  Left Ear: Tympanic membrane normal.  Nose: Rhinorrhea present. Right sinus exhibits maxillary sinus tenderness and frontal sinus tenderness. Left sinus exhibits maxillary sinus tenderness and frontal sinus tenderness.  Mouth/Throat: Mucous membranes are normal. No oropharyngeal exudate or posterior oropharyngeal erythema.  Eyes: Pupils are equal, round, and reactive to light.  Cardiovascular: Normal rate and regular rhythm.   No murmur heard. Pulmonary/Chest: Effort normal and breath sounds normal. She has no wheezes. She has no rales.  Abdominal: Soft.  Lymphadenopathy:    She has no cervical adenopathy.  Neurological: She is alert and oriented to person, place, and time.   Skin: Skin is warm and dry. No rash noted.  Psychiatric: She has a normal mood and affect. Her behavior is normal.      Assessment & Plan:  1. Acute pansinusitis, recurrence not specified Advised patient to increase fluids, rest, and RTC if symptoms do not improve with treatment in 3-4 days, worsen, or fever >101 develops. Antibiotic risks discussed and advised. Decision to treat with doxycycline due to length of symptoms and penicillin allergy. - doxycycline (VIBRA-TABS) 100 MG tablet; Take 1 tablet (100 mg total) by mouth 2 (  two) times daily.  Dispense: 20 tablet; Refill: 0

## 2015-07-08 ENCOUNTER — Telehealth: Payer: Self-pay | Admitting: Hematology and Oncology

## 2015-07-08 NOTE — Telephone Encounter (Signed)
s.w.l pt and advised on May appt....pt ok adn awaare

## 2015-07-19 ENCOUNTER — Telehealth: Payer: Self-pay | Admitting: Internal Medicine

## 2015-07-19 NOTE — Telephone Encounter (Signed)
Pt should follow up with PCP

## 2015-07-19 NOTE — Telephone Encounter (Signed)
Pt is aware.  

## 2015-07-19 NOTE — Telephone Encounter (Signed)
Pt saw Almyra Free on 3/09 with a sinus infection.  Pt usually sees Dr Quay Burow at [facility]. Pt DX with a sinus infection, has finished all her meds.however, pt states she is not well. Still has pain in her face, forehead and eyes. Would like toknow if she she continue on something for this.  [facility]/ [address]

## 2015-07-20 ENCOUNTER — Encounter: Payer: Self-pay | Admitting: Nurse Practitioner

## 2015-07-20 ENCOUNTER — Ambulatory Visit (INDEPENDENT_AMBULATORY_CARE_PROVIDER_SITE_OTHER): Payer: Medicare Other | Admitting: Nurse Practitioner

## 2015-07-20 VITALS — BP 120/68 | HR 85 | Temp 98.2°F | Ht 61.5 in | Wt 128.5 lb

## 2015-07-20 DIAGNOSIS — J0110 Acute frontal sinusitis, unspecified: Secondary | ICD-10-CM

## 2015-07-20 DIAGNOSIS — J019 Acute sinusitis, unspecified: Secondary | ICD-10-CM | POA: Insufficient documentation

## 2015-07-20 DIAGNOSIS — J329 Chronic sinusitis, unspecified: Secondary | ICD-10-CM | POA: Insufficient documentation

## 2015-07-20 MED ORDER — METHYLPREDNISOLONE 4 MG PO TABS: ORAL_TABLET | ORAL | Status: DC

## 2015-07-20 NOTE — Progress Notes (Signed)
Pre visit review using our clinic review tool, if applicable. No additional management support is needed unless otherwise documented below in the visit note. 

## 2015-07-20 NOTE — Assessment & Plan Note (Signed)
Established concern, worsening Will treat conservatively due to probable viral nature Prednisone taper to decrease inflammation given  Instructions for taking done verbally and on AVS FU prn worsening/failure to improve.

## 2015-07-20 NOTE — Patient Instructions (Signed)
Prednisone with breakfast or lunch at the latest.  6 tablets on day 1, 5 tablets on day 2, 4 tablets on day 3, 3 tablets on day 4, 2 tablets day 5, 1 tablet on day 6...done! Take tablets all together not spaced out Don't take with NSAIDs (Ibuprofen, Aleve, Naproxen, Meloxicam ect...)  Safe travels.

## 2015-07-20 NOTE — Progress Notes (Signed)
Patient ID: AIDIA MOCZYGEMBA, female    DOB: [birth year]  Age: 81 y.o. MRN: [id number]  CC: Sinusitis   HPI JOLETTE BERKLAND presents for CC of sinusitis x 2-3 weeks.  1) Finished abx on Sat and happened 2 weeks prior.  Still has frontal pressure and Rhinorrhea with clear drainage.  Headache  Improved under eyes- pressure, but still there  Leaving for 3 weeks to [place] and [place].   History Janyiah has a past medical history of Arthritis; Breast cancer ([place]) (1993); CML (chronic myeloid leukemia) ([place]); and Anemia requiring transfusions (2010 & 2015).   She has past surgical history that includes Knee surgery (mid 80's); Breast surgery; Colonoscopy (03/2003, last 2010); sigmoid colon resection (2010); ileostomy reversal (2011); Bilateral salpingoophorectomy (2010); gastrocslide; Abdominal hysterectomy (~1989); Appendectomy (~1989); Leg Tendon Surgery (Left, 2013); Trigger finger release (Left, 2013); Excision Morton's neuroma (Left, mid 80's); Total knee arthroplasty (Right, 06/11/2013); and Cataract extraction (06/08/2014).   Her family history includes Aneurysm in her father; Breast cancer in her paternal aunt; Cancer in her father; Colon cancer (age of onset: 50) in her mother; Diabetes in her cousin; Heart attack (age of onset: 51) in her paternal uncle; Uterine cancer in her sister. There is no history of Stroke, Esophageal cancer, Rectal cancer, or Stomach cancer.She reports that she quit smoking about 53 years ago. Her smoking use included Cigarettes. She quit after 5 years of use. She has never used smokeless tobacco. She reports that she drinks about 4.2 - 8.4 oz of alcohol per week. She reports that she does not use illicit drugs.  Outpatient Prescriptions Prior to Visit  Medication Sig Dispense Refill  . aspirin 81 MG tablet Take 81 mg by mouth daily.    Marland Kitchen ESTRACE VAGINAL 0.1 MG/GM vaginal cream     . folic acid (FOLVITE) 1 MG tablet TAKE 1 TABLET BY MOUTH EVERY DAY 90 tablet 0   . glucosamine-chondroitin 500-400 MG tablet Take 1 tablet by mouth 2 (two) times daily.    Marland Kitchen ibuprofen (ADVIL,MOTRIN) 200 MG tablet Take 200 mg by mouth every 6 (six) hours as needed for moderate pain.    Marland Kitchen imatinib (GLEEVEC) 100 MG tablet TAKE 3 TABLETS BY MOUTH EVERY DAY WITH MEALS AND A GLASS OF WATER 270 tablet 3  . Loperamide HCl (IMODIUM PO) Take by mouth.    . Multiple Vitamin (MULTIVITAMIN) tablet Take 1 tablet by mouth daily.    . Probiotic Product (ALIGN) 4 MG CAPS Take 1 capsule by mouth daily.    . diclofenac sodium (VOLTAREN) 1 % GEL Apply 1 g topically 4 (four) times daily. Reported on 07/20/2015    . doxycycline (VIBRA-TABS) 100 MG tablet Take 1 tablet (100 mg total) by mouth 2 (two) times daily. 20 tablet 0   No facility-administered medications prior to visit.    ROS Review of Systems  Constitutional: Positive for fatigue. Negative for fever, chills and diaphoresis.  HENT: Positive for congestion and sore throat.   Respiratory: Positive for cough. Negative for chest tightness, shortness of breath and wheezing.   Cardiovascular: Negative for chest pain, palpitations and leg swelling.  Gastrointestinal: Negative for nausea, vomiting and diarrhea.  Skin: Negative for rash.  Neurological: Negative for dizziness and headaches.    Objective:  BP 120/68 mmHg  Pulse 85  Temp(Src) 98.2 F (36.8 C) (Oral)  Ht 5' 1.5" (1.562 m)  Wt 128 lb 8 oz (58.287 kg)  BMI 23.89 kg/m2  SpO2 98%  Physical Exam  Constitutional: She  is oriented to person, place, and time. She appears well-developed and well-nourished. No distress.  HENT:  Head: Normocephalic and atraumatic.  Right Ear: External ear normal.  Left Ear: External ear normal.  Mouth/Throat: Oropharynx is clear and moist. No oropharyngeal exudate.  TMs clear bilaterally  Maxillary pressure with percussion on left   Eyes: EOM are normal. Pupils are equal, round, and reactive to light. Right eye exhibits no discharge. Left  eye exhibits no discharge. No scleral icterus.  Neck: Normal range of motion. Neck supple.  Cardiovascular: Normal rate, regular rhythm, normal heart sounds and intact distal pulses.  Exam reveals no gallop and no friction rub.   No murmur heard. Pulmonary/Chest: Effort normal and breath sounds normal. No respiratory distress. She has no wheezes. She has no rales. She exhibits no tenderness.  Lymphadenopathy:    She has no cervical adenopathy.  Neurological: She is alert and oriented to person, place, and time. No cranial nerve deficit. She exhibits normal muscle tone. Coordination normal.  Skin: Skin is warm and dry. No rash noted. She is not diaphoretic.  Psychiatric: She has a normal mood and affect. Her behavior is normal. Judgment and thought content normal.   Assessment & Plan:   Sanari was seen today for sinusitis.  Diagnoses and all orders for this visit:  Acute frontal sinusitis, recurrence not specified  Other orders -     methylPREDNISolone (MEDROL) 4 MG tablet; Take 6 tablets by mouth with breakfast or lunch and decrease by 1 tablet each day until gone.  I have discontinued Ms. Krass's doxycycline. I am also having her start on methylPREDNISolone. Additionally, I am having her maintain her diclofenac sodium, glucosamine-chondroitin, ALIGN, ESTRACE VAGINAL, ibuprofen, multivitamin, Loperamide HCl (IMODIUM PO), imatinib, folic acid, and aspirin.  Meds ordered this encounter  Medications  . methylPREDNISolone (MEDROL) 4 MG tablet    Sig: Take 6 tablets by mouth with breakfast or lunch and decrease by 1 tablet each day until gone.    Dispense:  21 tablet    Refill:  0    Order Specific Question:  Supervising Provider    Answer:  Crecencio Mc [[id number]]     Follow-up: Return if symptoms worsen or fail to improve.

## 2015-07-22 ENCOUNTER — Other Ambulatory Visit: Payer: Self-pay | Admitting: Hematology and Oncology

## 2015-08-08 ENCOUNTER — Encounter: Payer: Medicare Other | Admitting: Internal Medicine

## 2015-08-15 ENCOUNTER — Telehealth: Payer: Self-pay

## 2015-08-15 NOTE — Telephone Encounter (Signed)
Call to schedule AWV; Last AWV 08/06/14; no asnwer; will try later

## 2015-08-17 ENCOUNTER — Ambulatory Visit (INDEPENDENT_AMBULATORY_CARE_PROVIDER_SITE_OTHER): Payer: Medicare Other | Admitting: Internal Medicine

## 2015-08-17 ENCOUNTER — Other Ambulatory Visit (INDEPENDENT_AMBULATORY_CARE_PROVIDER_SITE_OTHER): Payer: Medicare Other

## 2015-08-17 ENCOUNTER — Encounter: Payer: Self-pay | Admitting: Internal Medicine

## 2015-08-17 VITALS — BP 106/70 | HR 80 | Temp 98.4°F | Resp 16 | Ht 61.5 in | Wt 131.0 lb

## 2015-08-17 DIAGNOSIS — M8580 Other specified disorders of bone density and structure, unspecified site: Secondary | ICD-10-CM

## 2015-08-17 DIAGNOSIS — J0111 Acute recurrent frontal sinusitis: Secondary | ICD-10-CM

## 2015-08-17 DIAGNOSIS — Z0000 Encounter for general adult medical examination without abnormal findings: Secondary | ICD-10-CM | POA: Diagnosis not present

## 2015-08-17 LAB — CBC WITH DIFFERENTIAL/PLATELET
BASOS ABS: 0 10*3/uL (ref 0.0–0.1)
Basophils Relative: 0.6 % (ref 0.0–3.0)
Eosinophils Absolute: 0.1 10*3/uL (ref 0.0–0.7)
Eosinophils Relative: 2.8 % (ref 0.0–5.0)
HCT: 30.7 % — ABNORMAL LOW (ref 36.0–46.0)
Hemoglobin: 10.4 g/dL — ABNORMAL LOW (ref 12.0–15.0)
LYMPHS ABS: 0.8 10*3/uL (ref 0.7–4.0)
Lymphocytes Relative: 27.9 % (ref 12.0–46.0)
MCHC: 33.9 g/dL (ref 30.0–36.0)
MCV: 111.6 fl — ABNORMAL HIGH (ref 78.0–100.0)
MONO ABS: 0.3 10*3/uL (ref 0.1–1.0)
Monocytes Relative: 12.9 % — ABNORMAL HIGH (ref 3.0–12.0)
Neutro Abs: 1.5 10*3/uL (ref 1.4–7.7)
Neutrophils Relative %: 55.8 % (ref 43.0–77.0)
Platelets: 158 10*3/uL (ref 150.0–400.0)
RBC: 2.75 Mil/uL — AB (ref 3.87–5.11)
RDW: 14.8 % (ref 11.5–15.5)
WBC: 2.7 10*3/uL — ABNORMAL LOW (ref 4.0–10.5)

## 2015-08-17 LAB — COMPREHENSIVE METABOLIC PANEL
ALK PHOS: 48 U/L (ref 39–117)
ALT: 15 U/L (ref 0–35)
AST: 28 U/L (ref 0–37)
Albumin: 4 g/dL (ref 3.5–5.2)
BILIRUBIN TOTAL: 0.5 mg/dL (ref 0.2–1.2)
BUN: 23 mg/dL (ref 6–23)
CO2: 31 meq/L (ref 19–32)
CREATININE: 0.97 mg/dL (ref 0.40–1.20)
Calcium: 9.3 mg/dL (ref 8.4–10.5)
Chloride: 106 mEq/L (ref 96–112)
GFR: 58.86 mL/min — ABNORMAL LOW (ref >60.00)
GLUCOSE: 99 mg/dL (ref 70–99)
Potassium: 4.8 mEq/L (ref 3.5–5.1)
Sodium: 143 mEq/L (ref 135–145)
TOTAL PROTEIN: 6.4 g/dL (ref 6.0–8.3)

## 2015-08-17 LAB — TSH: TSH: 1.64 u[IU]/mL (ref 0.35–4.50)

## 2015-08-17 LAB — LIPID PANEL
CHOL/HDL RATIO: 2
Cholesterol: 181 mg/dL (ref 0–200)
HDL: 82.8 mg/dL (ref >39.00)
LDL Cholesterol: 88 mg/dL (ref 0–99)
NONHDL: 97.98
TRIGLYCERIDES: 52 mg/dL (ref 0.0–149.0)
VLDL: 10.4 mg/dL (ref 0.0–40.0)

## 2015-08-17 LAB — VITAMIN B12: VITAMIN B 12: 287 pg/mL (ref 211–911)

## 2015-08-17 MED ORDER — DICLOFENAC SODIUM 1 % TD GEL: 1.0000 g | Freq: Four times a day (QID) | TRANSDERMAL | Status: DC

## 2015-08-17 MED ORDER — DOXYCYCLINE HYCLATE 100 MG PO TABS: 100.0000 mg | ORAL_TABLET | Freq: Two times a day (BID) | ORAL | Status: DC

## 2015-08-17 MED ORDER — ESTRADIOL 0.1 MG/GM VA CREA: 1.0000 | TOPICAL_CREAM | VAGINAL | Status: DC

## 2015-08-17 NOTE — Progress Notes (Signed)
Pre visit review using our clinic review tool, if applicable. No additional management support is needed unless otherwise documented below in the visit note. 

## 2015-08-17 NOTE — Progress Notes (Signed)
Subjective:    Patient ID: [name] [name], female    DOB: 1934/08/22, 81 y.o.   MRN: [id number]  HPI Here for medicare wellness exam/physical exam.   Sinus infection:  She was seen a couple of weeks ago.  She has pressure and slightly pain in her frontal area and top of head.  She denies congestion.  She was given a medrol dose pak and it helped while she was on it.  She still feels there is an infection in the sinuses.    Left hip:  Pain at night especially when laying on her side.  She denies pain with walking.  She has a history of bursitis.    Right shoulder pain:  She has pain with certain movements and at night.  The pain is located more in the upper arm.  She denies pain right now.  She has not taken anything for it.  She has an orthopedic she can see if needed.    She follows with oncology for her CML.    I have personally reviewed and have noted 1.The patient's medical and social history 2.Their use of alcohol, tobacco or illicit drugs 3.Their current medications and supplements 4.The patient's functional ability including ADL's, fall risks, home safety risks and                 hearing or visual impairment. 5.Diet and physical activities 6.Evidence for depression or mood disorders   Are there smokers in your home (other than you)? No  Risk Factors Exercise:  Walks for exercise Dietary issues discussed: well balanced, lots of fruit and veges  Cardiac risk factors: advanced age (older than 67 for men, 20 for women)  Depression Screen  Have you felt down, depressed or hopeless? No  Have you felt little interest or pleasure in doing things?  No Activities of Daily Living In your present state of health, do you have any difficulty performing the following activities?:  Driving? No Managing money?  No Feeding yourself? No Getting from bed to chair? No Climbing a flight of stairs? No Preparing food and eating?:  No Bathing or showering? No Getting dressed: No Getting to/using the toilet? No Moving around from place to place: No In the past year have you fallen or had a near fall?: No   Are you sexually active?  yes  Do you have more than one partner?  No  Hearing Difficulties: No Do you often ask people to speak up or repeat themselves? No Do you experience ringing or noises in your ears? yes Do you have difficulty understanding soft or whispered voices? No Vision:              Any change in vision: No              Up to date with eye exam:  yes  Memory:  Do you feel that you have a problem with memory? Yes, short term only  Do you often misplace items? No  Do you feel safe at home?  Yes  Cognitive Testing  Alert, Orientated? Yes  Normal Appearance? Yes  Recall of three objects?  Yes  Can perform simple calculations? Yes  Displays appropriate judgment? Yes  Can read the correct time from a watch face? Yes   Advanced Directives have been discussed with the patient? Yes, in place  Medications and allergies reviewed with patient and updated if appropriate.  Patient Active Problem List   Diagnosis Date Noted  .  Acute sinusitis 07/20/2015  . Other fatigue 04/07/2015  . AAA (abdominal aortic aneurysm) without rupture ([place]) 08/07/2014  . History of colonic polyps 08/06/2014  . Thrombocytopenia due to drugs 02/18/2014  . Leukopenia due to antineoplastic chemotherapy 02/18/2014  . 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 Other abnormal glucose 07/30/2012  . History of breast cancer 07/18/2012  . Osteopenia 09/15/2011  . Diarrhea 08/29/2011  . Family history of malignant neoplasm of gastrointestinal tract 08/29/2011  . HYPERLIPIDEMIA 04/14/2009  . Pancytopenia due to antineoplastic chemotherapy ([place]) 04/14/2009  . VISUAL IMPAIRMENT, TRANSIENT 04/14/2009  . RECTOVAGINAL FISTULA 11/10/2008  . DIVERTICULOSIS OF COLON 04/06/2008  . Chronic myeloid leukemia ([place]) 02/18/2007  . ROSACEA 02/18/2007     Current Outpatient Prescriptions on File Prior to Visit  Medication Sig Dispense Refill  . aspirin 81 MG tablet Take 81 mg by mouth daily.    . diclofenac sodium (VOLTAREN) 1 % GEL Apply 1 g topically 4 (four) times daily. Reported on 07/20/2015    . ESTRACE VAGINAL 0.1 MG/GM vaginal cream     . folic acid (FOLVITE) 1 MG tablet TAKE 1 TABLET BY MOUTH EVERY DAY 90 tablet 0  . glucosamine-chondroitin 500-400 MG tablet Take 1 tablet by mouth 2 (two) times daily.    Marland Kitchen ibuprofen (ADVIL,MOTRIN) 200 MG tablet Take 200 mg by mouth every 6 (six) hours as needed for moderate pain.    Marland Kitchen imatinib (GLEEVEC) 100 MG tablet TAKE 3 TABLETS BY MOUTH EVERY DAY WITH MEALS AND A GLASS OF WATER 270 tablet 3  . Loperamide HCl (IMODIUM PO) Take by mouth.    . Multiple Vitamin (MULTIVITAMIN) tablet Take 1 tablet by mouth daily.    . Probiotic Product (ALIGN) 4 MG CAPS Take 1 capsule by mouth daily.     No current facility-administered medications on file prior to visit.    Past Medical History  Diagnosis Date  . Arthritis     DJD  . Breast cancer ([place]) 1993    S/P lumpectomy , radiation, & Tamoxifen  . CML (chronic myeloid leukemia) ([place])     Dr Alvy Bimler  . Anemia requiring transfusions 2010 & 2015    Past Surgical History  Procedure Laterality Date  . Knee surgery  mid 80's    Arthroscopy, right knee   . Breast surgery      Right lumpectomy, s/p radiation 1993 and oral  Tamoxifen x 5 years [id number])   . Colonoscopy  03/2003, last 2010    diverticulosis, Dr.Kaplan  . Sigmoid colon resection  2010     Dr Arlyce Dice, [place] for fistula & diverticulitis  . Ileostomy reversal  2011    3 mos after above  . Bilateral salpingoophorectomy  2010    with bowel resection  . Gastrocslide      left leg; Dr Beola Cord  . Abdominal hysterectomy  ~1989    no BSO, for Dysplasia   . Appendectomy  ~1989  . Leg tendon surgery Left 2013    "cut on leg and fixed something"  . Trigger finger release Left 2013     thumb  . Excision morton's neuroma Left mid 80's    foot  . Total knee arthroplasty Right 06/11/2013    Procedure: RIGHT TOTAL KNEE ARTHROPLASTY;  Surgeon: Sydnee Cabal, MD;  Location: [facility];  Service: Orthopedics;  Laterality: Right;  . Cataract extraction  06/08/2014    left eye;Dr Herbert Deaner    Social History   Social History  . Marital Status: Married  Spouse Name: N/A  . Number of Children: N/A  . Years of Education: N/A   Social History Main Topics  . Smoking status: Former Smoker -- 5 years    Types: Cigarettes    Quit date: 04/30/1962  . Smokeless tobacco: Never Used     Comment: smoked 1956-1964, up to 1/2 ppd  . Alcohol Use: 4.2 - 8.4 oz/week    7-14 Glasses of wine per week  . Drug Use: No  . Sexual Activity: Yes   Other Topics Concern  . Not on file   Social History Narrative    Family History  Problem Relation Age of Onset  . Colon cancer Mother 19  . Uterine cancer Sister   . Diabetes Cousin   . Cancer Father     Lip cancer  . Breast cancer Paternal Aunt   . Heart attack Paternal Uncle 59  . Stroke Neg Hx   . Esophageal cancer Neg Hx   . Rectal cancer Neg Hx   . Stomach cancer Neg Hx   . Aneurysm Father     AAA;S/P surgery    Review of Systems  Constitutional: Negative for fever, chills, appetite change, fatigue and unexpected weight change.  HENT: Positive for sinus pressure. Negative for ear pain and hearing loss.   Eyes: Negative for visual disturbance.  Respiratory: Negative for cough, shortness of breath and wheezing.   Cardiovascular: Negative for chest pain, palpitations and leg swelling.  Gastrointestinal: Positive for diarrhea. Negative for nausea, abdominal pain, constipation and blood in stool.       Rare gerd  Genitourinary: Negative for dysuria and hematuria.  Musculoskeletal: Positive for arthralgias.  Skin: Negative for color change and rash.  Neurological: Positive for numbness (rare, right hand at night) and headaches.  Negative for dizziness and light-headedness.  Psychiatric/Behavioral: Negative for sleep disturbance and dysphoric mood. The patient is not nervous/anxious.        Objective:   Filed Vitals:   08/17/15 0803  BP: 106/70  Pulse: 80  Temp: 98.4 F (36.9 C)  Resp: 16   Filed Weights   08/17/15 0803  Weight: 131 lb (59.421 kg)   Body mass index is 24.35 kg/(m^2).   Physical Exam Constitutional: She appears well-developed and well-nourished. No distress.  HENT:  Head: Normocephalic and atraumatic.  Right Ear: External ear normal. Normal ear canal and TM Left Ear: External ear normal.  Normal ear canal and TM Mouth/Throat: Oropharynx is clear and moist.  Eyes: Conjunctivae and EOM are normal.  Neck: Neck supple. No tracheal deviation present. No thyromegaly present.  No carotid bruit  Cardiovascular: Normal rate, regular rhythm and normal heart sounds.   No murmur heard.  No edema. Pulmonary/Chest: Effort normal and breath sounds normal. No respiratory distress. She has no wheezes. She has no rales.  Breast: deferred to Gyn Abdominal: Soft. She exhibits no distension. There is no tenderness.  Lymphadenopathy: She has no cervical adenopathy.  Skin: Skin is warm and dry. She is not diaphoretic.  Psychiatric: She has a normal mood and affect. Her behavior is normal.         Assessment & Plan:   Wellness Exam Immunizations  Up to date  Colonoscopy not needed at this age 44  Up to date  dexa  Due later this year Gyn  Up to date  Eye exam  Up to date  Hearing loss - none Memory concerns/difficulties - short term only.  Check basic labs.  She will monitor closely and  let me know if she wants further evaluation with neuro Independent of ADLs - fully independent  Physical exam: Screening blood work ordered Immunizations   Up to date  Colonoscopy  Not needed at this age 15  Up to date  dexa  Due later this year Gyn  Up to date  Eye exam  Up to date  EKG  last done 2014 Exercise - walking regularly - stressed importance of keeping this up Weight  - normal BMI Skin  - no concerns, sees derm Substance abuse - none   See Problem List for Assessment and Plan of chronic medical problems.  F/u annually

## 2015-08-17 NOTE — Patient Instructions (Addendum)
[name] [name] , Thank you for taking time to come for your Medicare Wellness Visit. I appreciate your ongoing commitment to your health goals. Please review the following plan we discussed and let me know if I can assist you in the future.   These are the goals we discussed: Goals    None      This is a list of the screening recommended for you and due dates:  Health Maintenance  Topic Date Due  . Tetanus Vaccine  04/30/2016  . DEXA scan (bone density measurement)  Completed  . Pneumonia vaccines  Completed    Test(s) ordered today. Your results will be released to MyChart (or called to you) after review, usually within 72hours after test completion. If any changes need to be made, you will be notified at that same time.  All other Health Maintenance issues reviewed.   All recommended immunizations and age-appropriate screenings are up-to-date or discussed.  No immunizations administered today.   Medications reviewed and updated.  No changes recommended at this time.  Your prescription(s) have been submitted to your pharmacy. Please take as directed and contact our office if you believe you are having problem(s) with the medication(s).   Please followup annually   Health Maintenance, Female Adopting a healthy lifestyle and getting preventive care can go a long way to promote health and wellness. Talk with your health care provider about what schedule of regular examinations is right for you. This is a good chance for you to check in with your provider about disease prevention and staying healthy. In between checkups, there are plenty of things you can do on your own. Experts have done a lot of research about which lifestyle changes and preventive measures are most likely to keep you healthy. Ask your health care provider for more information. WEIGHT AND DIET  Eat a healthy diet  Be sure to include plenty of vegetables, fruits, low-fat dairy products, and lean protein.  Do not eat  a lot of foods high in solid fats, added sugars, or salt.  Get regular exercise. This is one of the most important things you can do for your health.  Most adults should exercise for at least 150 minutes each week. The exercise should increase your heart rate and make you sweat (moderate-intensity exercise).  Most adults should also do strengthening exercises at least twice a week. This is in addition to the moderate-intensity exercise.  Maintain a healthy weight  Body mass index (BMI) is a measurement that can be used to identify possible weight problems. It estimates body fat based on height and weight. Your health care provider can help determine your BMI and help you achieve or maintain a healthy weight.  For females 37 years of age and older:   A BMI below 18.5 is considered underweight.  A BMI of 18.5 to 24.9 is normal.  A BMI of 25 to 29.9 is considered overweight.  A BMI of 30 and above is considered obese.  Watch levels of cholesterol and blood lipids  You should start having your blood tested for lipids and cholesterol at 79 years of age, then have this test every 5 years.  You may need to have your cholesterol levels checked more often if:  Your lipid or cholesterol levels are high.  You are older than 79 years of age.  You are at high risk for heart disease.  CANCER SCREENING   Lung Cancer  Lung cancer screening is recommended for adults 55-80 years  old who are at high risk for lung cancer because of a history of smoking.  A yearly low-dose CT scan of the lungs is recommended for people who:  Currently smoke.  Have quit within the past 15 years.  Have at least a 30-pack-year history of smoking. A pack year is smoking an average of one pack of cigarettes a day for 1 year.  Yearly screening should continue until it has been 15 years since you quit.  Yearly screening should stop if you develop a health problem that would prevent you from having lung cancer  treatment.  Breast Cancer  Practice breast self-awareness. This means understanding how your breasts normally appear and feel.  It also means doing regular breast self-exams. Let your health care provider know about any changes, no matter how small.  If you are in your 20s or 30s, you should have a clinical breast exam (CBE) by a health care provider every 1-3 years as part of a regular health exam.  If you are 33 or older, have a CBE every year. Also consider having a breast X-ray (mammogram) every year.  If you have a family history of breast cancer, talk to your health care provider about genetic screening.  If you are at high risk for breast cancer, talk to your health care provider about having an MRI and a mammogram every year.  Breast cancer gene (BRCA) assessment is recommended for women who have family members with BRCA-related cancers. BRCA-related cancers include:  Breast.  Ovarian.  Tubal.  Peritoneal cancers.  Results of the assessment will determine the need for genetic counseling and BRCA1 and BRCA2 testing. Cervical Cancer Your health care provider may recommend that you be screened regularly for cancer of the pelvic organs (ovaries, uterus, and vagina). This screening involves a pelvic examination, including checking for microscopic changes to the surface of your cervix (Pap test). You may be encouraged to have this screening done every 3 years, beginning at age 71.  For women ages 98-65, health care providers may recommend pelvic exams and Pap testing every 3 years, or they may recommend the Pap and pelvic exam, combined with testing for human papilloma virus (HPV), every 5 years. Some types of HPV increase your risk of cervical cancer. Testing for HPV may also be done on women of any age with unclear Pap test results.  Other health care providers may not recommend any screening for nonpregnant women who are considered low risk for pelvic cancer and who do not have  symptoms. Ask your health care provider if a screening pelvic exam is right for you.  If you have had past treatment for cervical cancer or a condition that could lead to cancer, you need Pap tests and screening for cancer for at least 20 years after your treatment. If Pap tests have been discontinued, your risk factors (such as having a new sexual partner) need to be reassessed to determine if screening should resume. Some women have medical problems that increase the chance of getting cervical cancer. In these cases, your health care provider may recommend more frequent screening and Pap tests. Colorectal Cancer  This type of cancer can be detected and often prevented.  Routine colorectal cancer screening usually begins at 79 years of age and continues through 79 years of age.  Your health care provider may recommend screening at an earlier age if you have risk factors for colon cancer.  Your health care provider may also recommend using home test kits to  check for hidden blood in the stool.  A small camera at the end of a tube can be used to examine your colon directly (sigmoidoscopy or colonoscopy). This is done to check for the earliest forms of colorectal cancer.  Routine screening usually begins at age 60.  Direct examination of the colon should be repeated every 5-10 years through 80 years of age. However, you may need to be screened more often if early forms of precancerous polyps or small growths are found. Skin Cancer  Check your skin from head to toe regularly.  Tell your health care provider about any new moles or changes in moles, especially if there is a change in a mole's shape or color.  Also tell your health care provider if you have a mole that is larger than the size of a pencil eraser.  Always use sunscreen. Apply sunscreen liberally and repeatedly throughout the day.  Protect yourself by wearing long sleeves, pants, a wide-brimmed hat, and sunglasses whenever you are  outside. HEART DISEASE, DIABETES, AND HIGH BLOOD PRESSURE   High blood pressure causes heart disease and increases the risk of stroke. High blood pressure is more likely to develop in:  People who have blood pressure in the high end of the normal range (130-139/85-89 mm Hg).  People who are overweight or obese.  People who are African American.  If you are 22-29 years of age, have your blood pressure checked every 3-5 years. If you are 70 years of age or older, have your blood pressure checked every year. You should have your blood pressure measured twice--once when you are at a hospital or clinic, and once when you are not at a hospital or clinic. Record the average of the two measurements. To check your blood pressure when you are not at a hospital or clinic, you can use:  An automated blood pressure machine at a pharmacy.  A home blood pressure monitor.  If you are between 68 years and 38 years old, ask your health care provider if you should take aspirin to prevent strokes.  Have regular diabetes screenings. This involves taking a blood sample to check your fasting blood sugar level.  If you are at a normal weight and have a low risk for diabetes, have this test once every three years after 79 years of age.  If you are overweight and have a high risk for diabetes, consider being tested at a younger age or more often. PREVENTING INFECTION  Hepatitis B  If you have a higher risk for hepatitis B, you should be screened for this virus. You are considered at high risk for hepatitis B if:  You were born in a country where hepatitis B is common. Ask your health care provider which countries are considered high risk.  Your parents were born in a high-risk country, and you have not been immunized against hepatitis B (hepatitis B vaccine).  You have HIV or AIDS.  You use needles to inject street drugs.  You live with someone who has hepatitis B.  You have had sex with someone who has  hepatitis B.  You get hemodialysis treatment.  You take certain medicines for conditions, including cancer, organ transplantation, and autoimmune conditions. Hepatitis C  Blood testing is recommended for:  Everyone born from [birth year] through 1963.  Anyone with known risk factors for hepatitis C. Sexually transmitted infections (STIs)  You should be screened for sexually transmitted infections (STIs) including gonorrhea and chlamydia if:  You are sexually  active and are younger than 79 years of age.  You are older than 79 years of age and your health care provider tells you that you are at risk for this type of infection.  Your sexual activity has changed since you were last screened and you are at an increased risk for chlamydia or gonorrhea. Ask your health care provider if you are at risk.  If you do not have HIV, but are at risk, it may be recommended that you take a prescription medicine daily to prevent HIV infection. This is called pre-exposure prophylaxis (PrEP). You are considered at risk if:  You are sexually active and do not regularly use condoms or know the HIV status of your partner(s).  You take drugs by injection.  You are sexually active with a partner who has HIV. Talk with your health care provider about whether you are at high risk of being infected with HIV. If you choose to begin PrEP, you should first be tested for HIV. You should then be tested every 3 months for as long as you are taking PrEP.  PREGNANCY   If you are premenopausal and you may become pregnant, ask your health care provider about preconception counseling.  If you may become pregnant, take 400 to 800 micrograms (mcg) of folic acid every day.  If you want to prevent pregnancy, talk to your health care provider about birth control (contraception). OSTEOPOROSIS AND MENOPAUSE   Osteoporosis is a disease in which the bones lose minerals and strength with aging. This can result in serious bone  fractures. Your risk for osteoporosis can be identified using a bone density scan.  If you are 30 years of age or older, or if you are at risk for osteoporosis and fractures, ask your health care provider if you should be screened.  Ask your health care provider whether you should take a calcium or vitamin D supplement to lower your risk for osteoporosis.  Menopause may have certain physical symptoms and risks.  Hormone replacement therapy may reduce some of these symptoms and risks. Talk to your health care provider about whether hormone replacement therapy is right for you.  HOME CARE INSTRUCTIONS   Schedule regular health, dental, and eye exams.  Stay current with your immunizations.   Do not use any tobacco products including cigarettes, chewing tobacco, or electronic cigarettes.  If you are pregnant, do not drink alcohol.  If you are breastfeeding, limit how much and how often you drink alcohol.  Limit alcohol intake to no more than 1 drink per day for nonpregnant women. One drink equals 12 ounces of beer, 5 ounces of wine, or 1 ounces of hard liquor.  Do not use street drugs.  Do not share needles.  Ask your health care provider for help if you need support or information about quitting drugs.  Tell your health care provider if you often feel depressed.  Tell your health care provider if you have ever been abused or do not feel safe at home.   This information is not intended to replace advice given to you by your health care provider. Make sure you discuss any questions you have with your health care provider.   Document Released: 10/30/2010 Document Revised: 05/07/2014 Document Reviewed: 03/18/2013 Elsevier Interactive Patient Education [employer].

## 2015-08-17 NOTE — Assessment & Plan Note (Signed)
Due for bone density later this year Taking a multivitamin-we'll check vitamin D level Advised for about 1200 mg calcium daily Continue regular exercise-walking

## 2015-08-17 NOTE — Assessment & Plan Note (Signed)
Didn't completely clear with Antibiotic and Medrol Dosepak last month Doxycycline 100 mg twice daily for 10 days Call if no improvement

## 2015-08-21 LAB — VITAMIN D 1,25 DIHYDROXY
VITAMIN D 1, 25 (OH) TOTAL: 47 pg/mL (ref 18–72)
Vitamin D3 1, 25 (OH)2: 47 pg/mL

## 2015-08-29 ENCOUNTER — Telehealth: Payer: Self-pay

## 2015-08-29 ENCOUNTER — Ambulatory Visit: Payer: Medicare Other | Admitting: Hematology and Oncology

## 2015-08-29 ENCOUNTER — Other Ambulatory Visit (HOSPITAL_BASED_OUTPATIENT_CLINIC_OR_DEPARTMENT_OTHER): Payer: Medicare Other

## 2015-08-29 DIAGNOSIS — D61810 Antineoplastic chemotherapy induced pancytopenia: Secondary | ICD-10-CM | POA: Diagnosis not present

## 2015-08-29 DIAGNOSIS — T451X5A Adverse effect of antineoplastic and immunosuppressive drugs, initial encounter: Secondary | ICD-10-CM

## 2015-08-29 DIAGNOSIS — C9210 Chronic myeloid leukemia, BCR/ABL-positive, not having achieved remission: Secondary | ICD-10-CM | POA: Diagnosis not present

## 2015-08-29 LAB — CBC WITH DIFFERENTIAL/PLATELET
BASO%: 0.6 % (ref 0.0–2.0)
BASOS ABS: 0 10*3/uL (ref 0.0–0.1)
EOS%: 1.4 % (ref 0.0–7.0)
Eosinophils Absolute: 0 10*3/uL (ref 0.0–0.5)
HEMATOCRIT: 29.2 % — AB (ref 34.8–46.6)
HEMOGLOBIN: 9.8 g/dL — AB (ref 11.6–15.9)
LYMPH#: 0.8 10*3/uL — AB (ref 0.9–3.3)
LYMPH%: 24.5 % (ref 14.0–49.7)
MCH: 38 pg — ABNORMAL HIGH (ref 25.1–34.0)
MCHC: 33.5 g/dL (ref 31.5–36.0)
MCV: 113.5 fL — ABNORMAL HIGH (ref 79.5–101.0)
MONO#: 0.5 10*3/uL (ref 0.1–0.9)
MONO%: 13.7 % (ref 0.0–14.0)
NEUT#: 2 10*3/uL (ref 1.5–6.5)
NEUT%: 59.8 % (ref 38.4–76.8)
Platelets: 134 10*3/uL — ABNORMAL LOW (ref 145–400)
RBC: 2.57 10*6/uL — ABNORMAL LOW (ref 3.70–5.45)
RDW: 14.2 % (ref 11.2–14.5)
WBC: 3.4 10*3/uL — ABNORMAL LOW (ref 3.9–10.3)

## 2015-08-29 LAB — COMPREHENSIVE METABOLIC PANEL
ALBUMIN: 3.9 g/dL (ref 3.5–5.0)
ALK PHOS: 50 U/L (ref 40–150)
ALT: 18 U/L (ref 0–55)
AST: 33 U/L (ref 5–34)
Anion Gap: 6 mEq/L (ref 3–11)
BUN: 26.2 mg/dL — AB (ref 7.0–26.0)
CALCIUM: 9.3 mg/dL (ref 8.4–10.4)
CO2: 30 mEq/L — ABNORMAL HIGH (ref 22–29)
CREATININE: 1 mg/dL (ref 0.6–1.1)
Chloride: 105 mEq/L (ref 98–109)
EGFR: 54 mL/min/{1.73_m2} — ABNORMAL LOW (ref >90)
Glucose: 90 mg/dl (ref 70–140)
Potassium: 4.4 mEq/L (ref 3.5–5.1)
Sodium: 141 mEq/L (ref 136–145)
TOTAL PROTEIN: 6.2 g/dL — AB (ref 6.4–8.3)
Total Bilirubin: 0.6 mg/dL (ref 0.20–1.20)

## 2015-08-29 NOTE — Telephone Encounter (Signed)
PA initiated via CoverMyMeds key [id number]

## 2015-08-30 NOTE — Telephone Encounter (Signed)
APPROVED through 04/29/2016 

## 2015-09-01 ENCOUNTER — Telehealth: Payer: Self-pay | Admitting: Internal Medicine

## 2015-09-01 DIAGNOSIS — Z0000 Encounter for general adult medical examination without abnormal findings: Secondary | ICD-10-CM

## 2015-09-01 MED ORDER — DICLOFENAC SODIUM 1 % TD GEL: 2.0000 g | Freq: Four times a day (QID) | TRANSDERMAL | Status: DC

## 2015-09-01 NOTE — Telephone Encounter (Signed)
RX resent to [facility]

## 2015-09-01 NOTE — Telephone Encounter (Signed)
Pt callled in and said the her diclofenac sodium (VOLTAREN) 1 % GEL [id number] .  There has ready be a approved PA on this.  Can we resend this in to the pharmacy.    Pharmacy on [facility] [address] .

## 2015-09-05 ENCOUNTER — Telehealth: Payer: Self-pay | Admitting: Hematology and Oncology

## 2015-09-05 ENCOUNTER — Ambulatory Visit (HOSPITAL_BASED_OUTPATIENT_CLINIC_OR_DEPARTMENT_OTHER): Payer: Medicare Other | Admitting: Hematology and Oncology

## 2015-09-05 ENCOUNTER — Other Ambulatory Visit: Payer: Medicare Other

## 2015-09-05 VITALS — BP 130/65 | HR 74 | Temp 98.2°F | Resp 18 | Ht 61.5 in | Wt 125.4 lb

## 2015-09-05 DIAGNOSIS — C9210 Chronic myeloid leukemia, BCR/ABL-positive, not having achieved remission: Secondary | ICD-10-CM | POA: Diagnosis not present

## 2015-09-05 DIAGNOSIS — D61810 Antineoplastic chemotherapy induced pancytopenia: Secondary | ICD-10-CM

## 2015-09-05 DIAGNOSIS — T451X5A Adverse effect of antineoplastic and immunosuppressive drugs, initial encounter: Secondary | ICD-10-CM

## 2015-09-05 NOTE — Telephone Encounter (Signed)
Gave and printed appt sched and avs fo rpt for OCT and NOV  °

## 2015-09-05 NOTE — Progress Notes (Signed)
[place] OFFICE PROGRESS NOTE  Patient Care Team: Binnie Rail, MD as PCP - General (Internal Medicine)  SUMMARY OF ONCOLOGIC HISTORY:   Chronic myeloid leukemia ([place])   02/18/2007 Initial Diagnosis Chronic myeloid leukemia   08/20/2014 Tumor Marker BCR/ABL is undetectable   03/04/2015 Pathology Results BCR/ABL is undetectable   08/29/2015 Tumor Marker BCR/ABL is undetectable    This patient was diagnosed with breast cancer in 1993. At the time she had lumpectomy and radiation therapy. I do not know the stage of her breast cancer but she has no evidence of recurrence. The patient was diagnosed with CML after routine blood work revealed leukocytosis. She was placed on Gleevec long-term. Her last BCR/ABL from December 2013 showed that she is a molecular remission. She has mild diarrhea on Gleevec. INTERVAL HISTORY: Please see below for problem oriented charting. The patient has recent infection, completely recovered from it. She has excellent energy level. The patient denies any recent signs or symptoms of bleeding such as spontaneous epistaxis, hematuria or hematochezia.   REVIEW OF SYSTEMS:   Constitutional: Denies fevers, chills or abnormal weight loss Eyes: Denies blurriness of vision Ears, nose, mouth, throat, and face: Denies mucositis or sore throat Respiratory: Denies cough, dyspnea or wheezes Cardiovascular: Denies palpitation, chest discomfort or lower extremity swelling Gastrointestinal:  Denies nausea, heartburn or change in bowel habits Skin: Denies abnormal skin rashes Lymphatics: Denies new lymphadenopathy or easy bruising Neurological:Denies numbness, tingling or new weaknesses Behavioral/Psych: Mood is stable, no new changes  All other systems were reviewed with the patient and are negative.  I have reviewed the past medical history, past surgical history, social history and family history with the patient and they are unchanged from previous  note.  ALLERGIES:  is allergic to penicillins.  MEDICATIONS:  Current Outpatient Prescriptions  Medication Sig Dispense Refill  . aspirin 81 MG tablet Take 81 mg by mouth daily.    . diclofenac sodium (VOLTAREN) 1 % GEL Apply 2 g topically 4 (four) times daily. 100 g 5  . estradiol (ESTRACE VAGINAL) 0.1 MG/GM vaginal cream Place 1 Applicatorful vaginally 2 (two) times a week. [id number] g 5  . folic acid (FOLVITE) 1 MG tablet TAKE 1 TABLET BY MOUTH EVERY DAY 90 tablet 0  . glucosamine-chondroitin 500-400 MG tablet Take 1 tablet by mouth 2 (two) times daily.    Marland Kitchen ibuprofen (ADVIL,MOTRIN) 200 MG tablet Take 200 mg by mouth every 6 (six) hours as needed for moderate pain.    Marland Kitchen imatinib (GLEEVEC) 100 MG tablet TAKE 3 TABLETS BY MOUTH EVERY DAY WITH MEALS AND A GLASS OF WATER 270 tablet 3  . Loperamide HCl (IMODIUM PO) Take by mouth.    . Multiple Vitamin (MULTIVITAMIN) tablet Take 1 tablet by mouth daily.    . Probiotic Product (ALIGN) 4 MG CAPS Take 1 capsule by mouth daily.     No current facility-administered medications for this visit.    PHYSICAL EXAMINATION: ECOG PERFORMANCE STATUS: 0 - Asymptomatic  Filed Vitals:   09/05/15 1213  BP: 130/65  Pulse: 74  Temp: 98.2 F (36.8 C)  Resp: 18   Filed Weights   09/05/15 1213  Weight: 125 lb 6.4 oz (56.881 kg)    GENERAL:alert, no distress and comfortable SKIN: skin color, texture, turgor are normal, no rashes or significant lesions EYES: normal, Conjunctiva are pink and non-injected, sclera clear OROPHARYNX:no exudate, no erythema and lips, buccal mucosa, and tongue normal  NECK: supple, thyroid normal size, non-tender, without  nodularity LYMPH:  no palpable lymphadenopathy in the cervical, axillary or inguinal LUNGS: clear to auscultation and percussion with normal breathing effort HEART: regular rate & rhythm and no murmurs and no lower extremity edema ABDOMEN:abdomen soft, non-tender and normal bowel sounds Musculoskeletal:no  cyanosis of digits and no clubbing  NEURO: alert & oriented x 3 with fluent speech, no focal motor/sensory deficits  LABORATORY DATA:  I have reviewed the data as listed    Component Value Date/Time   NA 141 08/29/2015 1208   NA 143 08/17/2015 0851   NA 135 09/13/2009 0924   K 4.4 08/29/2015 1208   K 4.8 08/17/2015 0851   K 4.3 09/13/2009 0924   CL 106 08/17/2015 0851   CL 105 09/04/2012 1006   CL 98 09/13/2009 0924   CO2 30* 08/29/2015 1208   CO2 31 08/17/2015 0851   CO2 30 09/13/2009 0924   GLUCOSE 90 08/29/2015 1208   GLUCOSE 99 08/17/2015 0851   GLUCOSE 96 09/04/2012 1006   GLUCOSE 94 09/13/2009 0924   BUN 26.2* 08/29/2015 1208   BUN 23 08/17/2015 0851   BUN 23* 09/13/2009 0924   CREATININE 1.0 08/29/2015 1208   CREATININE 0.97 08/17/2015 0851   CREATININE 1.0 09/13/2009 0924   CALCIUM 9.3 08/29/2015 1208   CALCIUM 9.3 08/17/2015 0851   CALCIUM 9.3 09/13/2009 0924   PROT 6.2* 08/29/2015 1208   PROT 6.4 08/17/2015 0851   PROT 6.9 09/13/2009 0924   ALBUMIN 3.9 08/29/2015 1208   ALBUMIN 4.0 08/17/2015 0851   ALBUMIN 4.0 09/13/2009 0924   AST 33 08/29/2015 1208   AST 28 08/17/2015 0851   AST 32 09/13/2009 0924   ALT 18 08/29/2015 1208   ALT 15 08/17/2015 0851   ALT 24 09/13/2009 0924   ALKPHOS 50 08/29/2015 1208   ALKPHOS 48 08/17/2015 0851   ALKPHOS 72 09/13/2009 0924   BILITOT 0.60 08/29/2015 1208   BILITOT 0.5 08/17/2015 0851   BILITOT 0.80 09/13/2009 0924   GFRNONAA 79* 06/12/2013 0452   GFRAA >90 06/12/2013 0452    No results found for: SPEP, UPEP  Lab Results  Component Value Date   WBC 3.4* 08/29/2015   NEUTROABS 2.0 08/29/2015   HGB 9.8* 08/29/2015   HCT 29.2* 08/29/2015   MCV 113.5* 08/29/2015   PLT 134* 08/29/2015      Chemistry      Component Value Date/Time   NA 141 08/29/2015 1208   NA 143 08/17/2015 0851   NA 135 09/13/2009 0924   K 4.4 08/29/2015 1208   K 4.8 08/17/2015 0851   K 4.3 09/13/2009 0924   CL 106 08/17/2015 0851    CL 105 09/04/2012 1006   CL 98 09/13/2009 0924   CO2 30* 08/29/2015 1208   CO2 31 08/17/2015 0851   CO2 30 09/13/2009 0924   BUN 26.2* 08/29/2015 1208   BUN 23 08/17/2015 0851   BUN 23* 09/13/2009 0924   CREATININE 1.0 08/29/2015 1208   CREATININE 0.97 08/17/2015 0851   CREATININE 1.0 09/13/2009 0924      Component Value Date/Time   CALCIUM 9.3 08/29/2015 1208   CALCIUM 9.3 08/17/2015 0851   CALCIUM 9.3 09/13/2009 0924   ALKPHOS 50 08/29/2015 1208   ALKPHOS 48 08/17/2015 0851   ALKPHOS 72 09/13/2009 0924   AST 33 08/29/2015 1208   AST 28 08/17/2015 0851   AST 32 09/13/2009 0924   ALT 18 08/29/2015 1208   ALT 15 08/17/2015 0851   ALT 24 09/13/2009 [id number]  BILITOT 0.60 08/29/2015 1208   BILITOT 0.5 08/17/2015 0851   BILITOT 0.80 09/13/2009 0924       ASSESSMENT & PLAN:  Chronic myeloid leukemia Her last BCR/ABL showed she is in molecular remission.  I reviewed with her the current guidelines regarding discontinuation of treatment. That would require commitment to come here on a monthly basis for blood work for minimum of 6 months. The patient would like to think about it. She has pancytopenia is likely related to chronic therapy with Gleevec. She is not symptomatic. She will continue on current dose of Gleevec at 300 milligrams daily and will see back in 6 months.  Pancytopenia due to antineoplastic chemotherapy [facility]) This is likely due to recent treatment. The patient denies recent history of bleeding such as epistaxis, hematuria or hematochezia. She is asymptomatic from the anemia & thrombocytopenia. I will observe for now. I will continue the chemotherapy at current dose without dosage adjustment.  If the anemia gets progressive worse in the future, I might have to delay her treatment or adjust the chemotherapy dose.     Orders Placed This Encounter  Procedures  . CBC with Differential/Platelet    Standing Status: Future     Number of Occurrences:      Standing  Expiration Date: 10/09/2016  . Comprehensive metabolic panel    Standing Status: Future     Number of Occurrences:      Standing Expiration Date: 10/09/2016  . BCR-ABL    With RT-PCR technique    Standing Status: Future     Number of Occurrences:      Standing Expiration Date: 10/09/2016   All questions were answered. The patient knows to call the clinic with any problems, questions or concerns. No barriers to learning was detected. I spent 15 minutes counseling the patient face to face. The total time spent in the appointment was 20 minutes and more than 50% was on counseling and review of test results     [facility], [place], MD 09/06/2015 12:06 PM

## 2015-09-06 ENCOUNTER — Encounter: Payer: Self-pay | Admitting: Hematology and Oncology

## 2015-09-06 NOTE — Assessment & Plan Note (Signed)
This is likely due to recent treatment. The patient denies recent history of bleeding such as epistaxis, hematuria or hematochezia. She is asymptomatic from the anemia & thrombocytopenia. I will observe for now. I will continue the chemotherapy at current dose without dosage adjustment.  If the anemia gets progressive worse in the future, I might have to delay her treatment or adjust the chemotherapy dose.

## 2015-09-06 NOTE — Assessment & Plan Note (Signed)
Her last BCR/ABL showed she is in molecular remission.  I reviewed with her the current guidelines regarding discontinuation of treatment. That would require commitment to come here on a monthly basis for blood work for minimum of 6 months. The patient would like to think about it. She has pancytopenia is likely related to chronic therapy with Gleevec. She is not symptomatic. She will continue on current dose of Gleevec at 300 milligrams daily and will see back in 6 months.

## 2015-09-12 ENCOUNTER — Ambulatory Visit: Payer: Medicare Other | Admitting: Hematology and Oncology

## 2015-10-02 ENCOUNTER — Encounter: Payer: Self-pay | Admitting: Internal Medicine

## 2015-10-02 DIAGNOSIS — E559 Vitamin D deficiency, unspecified: Secondary | ICD-10-CM | POA: Insufficient documentation

## 2015-10-25 ENCOUNTER — Other Ambulatory Visit: Payer: Self-pay | Admitting: Hematology and Oncology

## 2015-12-23 IMAGING — US US AORTA
1 series · 14 of 16 positions shown · non-contrast
Comparison: None.

CLINICAL DATA: History of leukemia question abdominal aortic
aneurysm on physical exam

EXAM:
ULTRASOUND OF ABDOMINAL AORTA
TECHNIQUE: Ultrasound examination of the abdominal aorta was performed to
evaluate for abdominal aortic aneurysm.

[Series 1: us aorta · 0.26mm/px · 14 of 16 slices shown]
[im 1/16]
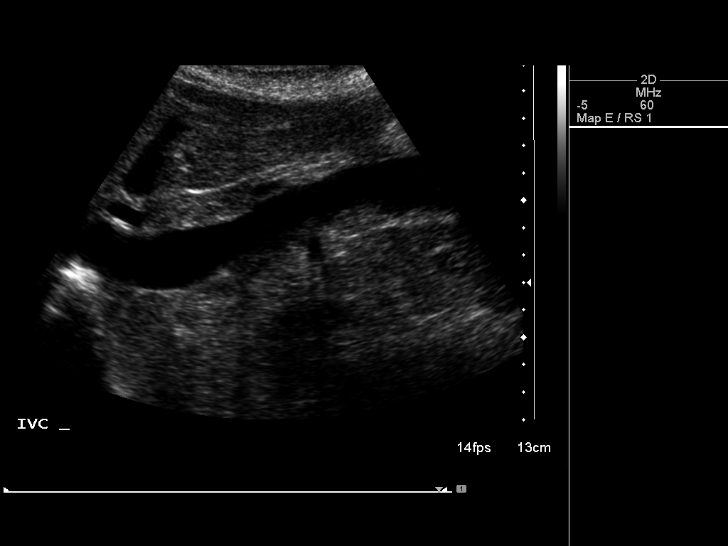
[im 2/16]
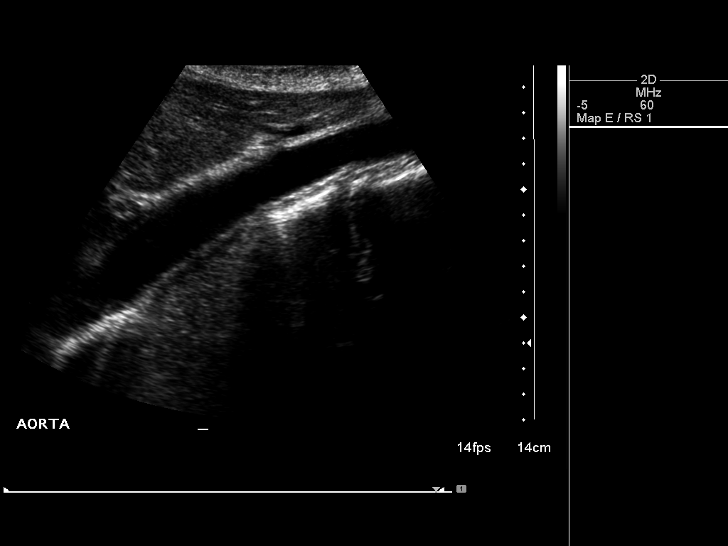
[im 3/16]
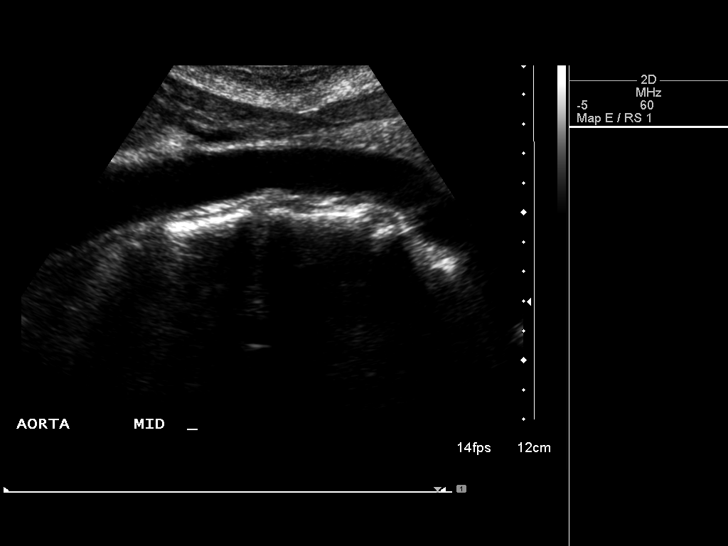
[im 5/16]
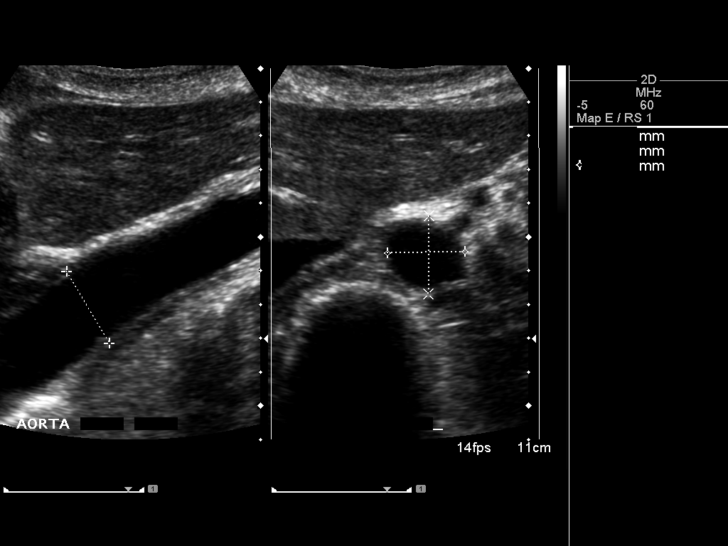
[im 6/16]
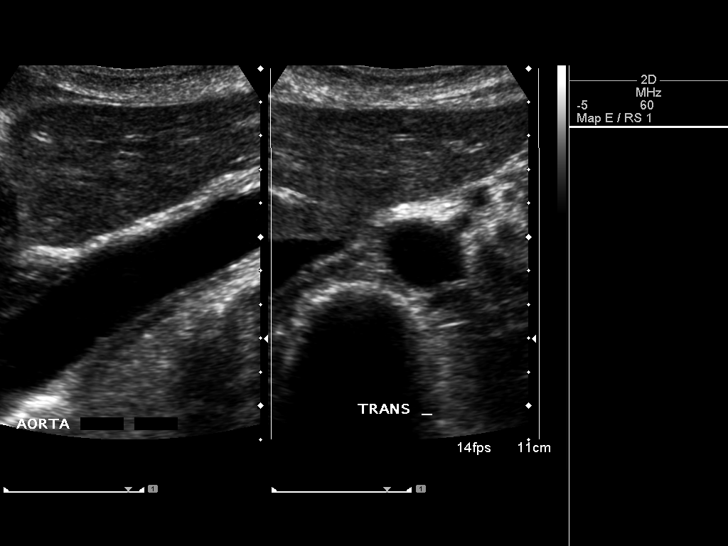
[im 7/16]
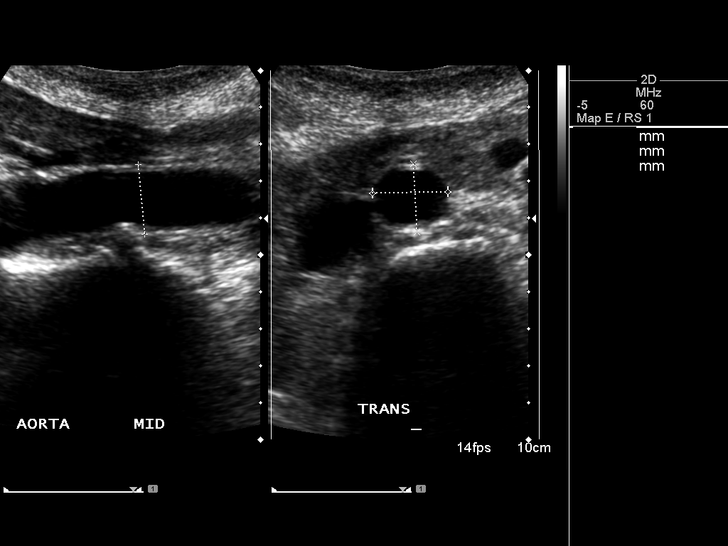
[im 8/16]
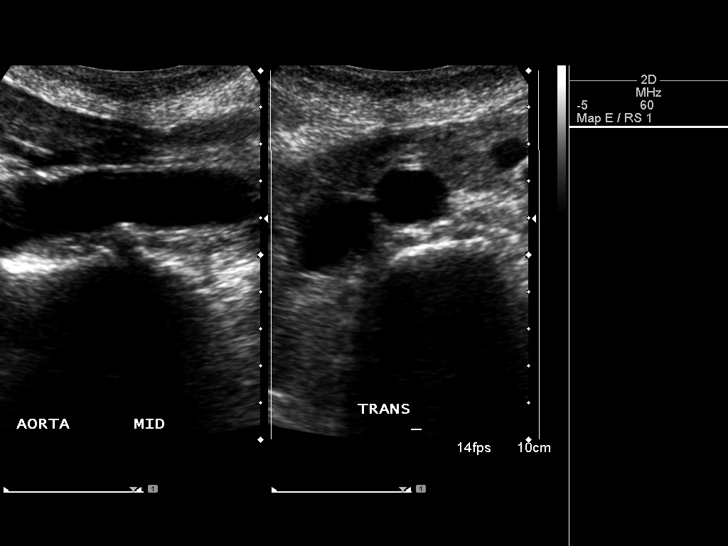
[im 9/16]
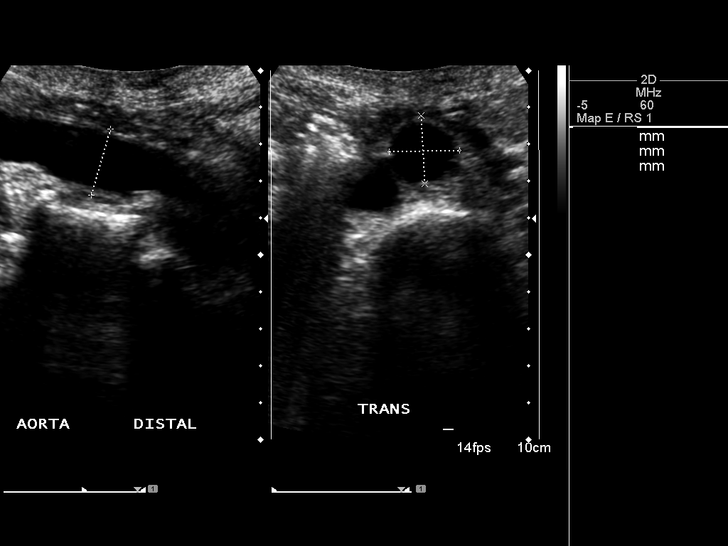
[im 10/16]
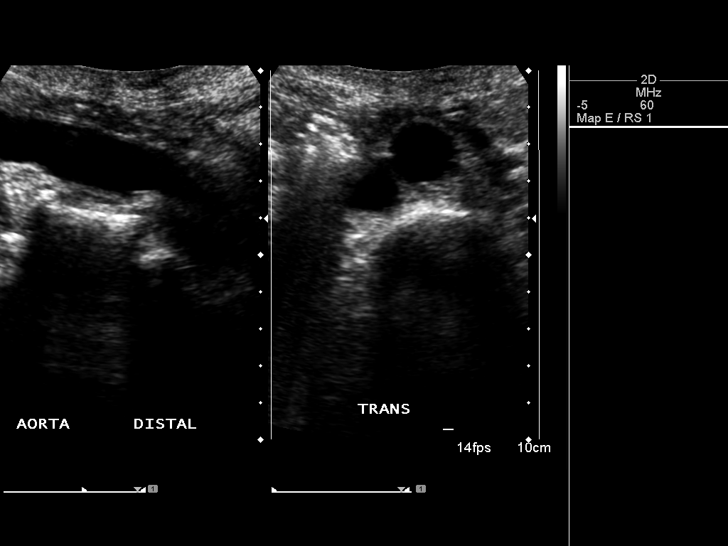
[im 11/16]
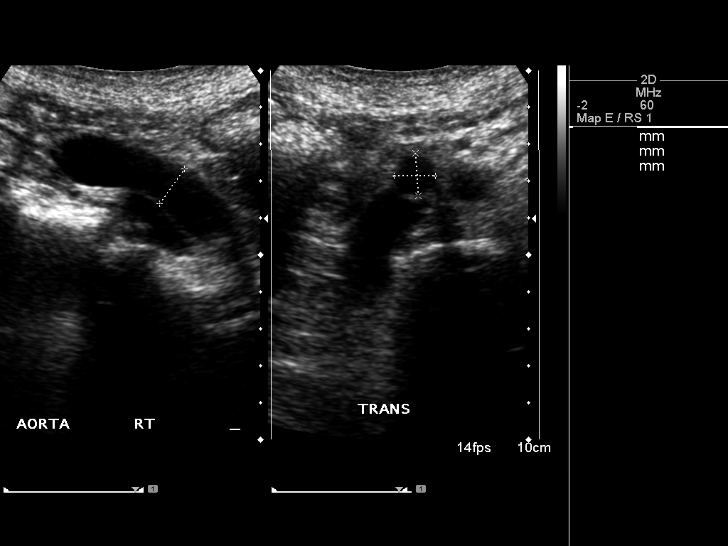
[im 13/16]
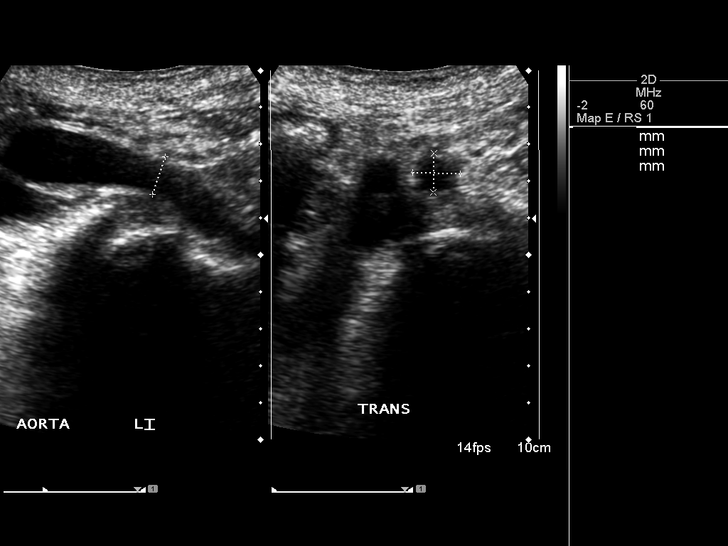
[im 14/16]
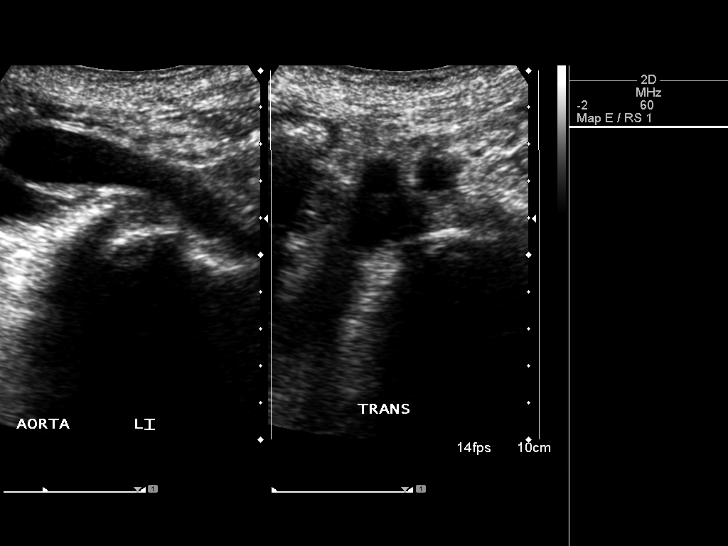
[im 15/16]
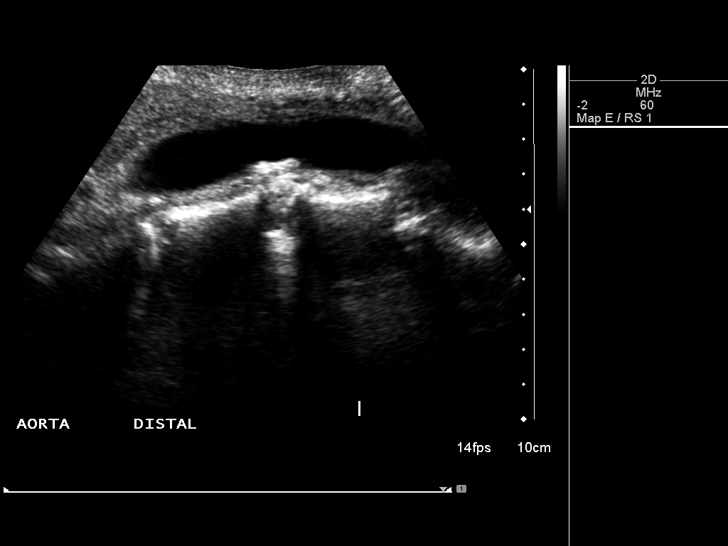
[im 16/16]
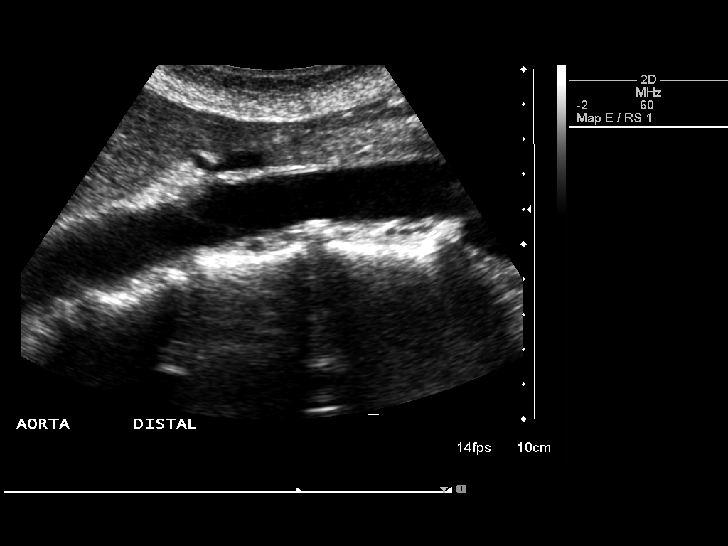

[14 of 16 positions shown; findings below may reference images not displayed]

FINDINGS: Abdominal Aorta

No abdominal aortic aneurysm is seen. Proximally the abdominal aorta
measures 2.5 x 2.3 cm. The mid portion of the abdominal aorta
measures 1.9 x 2.0 cm. The distal abdominal aorta measures 1.9 x
cm.

The right common iliac artery measures 1.2 cm in diameter proximally
with the left common iliac artery measuring 1.3 cm. Atheromatous
plaque formation is noted.

Maximum AP

Diameter:  2.5 cm proximally.

Maximum TRV

Diameter: 2.3 cm proximally.
IMPRESSION: No abdominal aortic aneurysm is seen. There is atheromatous change
present.

## 2016-01-18 ENCOUNTER — Other Ambulatory Visit: Payer: Self-pay | Admitting: Hematology and Oncology

## 2016-02-16 NOTE — Progress Notes (Signed)
Subjective:    Patient ID: [name] [name], female    DOB: February 13, 1935, 81 y.o.   MRN: [id number]  HPI She is here for an acute visit.   Sinus issues:  She has mild sinus headaches, mild post nasal drip.  Some times her headaches are sharp and more severe, but that is transient. She has not taken anything, Including Tylenol and Advil. She has never used nasal sprays. She denies any fevers, nasal congestion and sore throat, ear pain and plugged sensation sinus pressure bothers her most has been persistent. She has had this several times in the past year or so and has taken antibiotics and steroids. This helps 50%, but does not take the symptoms away completely. Prior to that she has had one sinus infection and she was in her 69s.   She has no history of seasonal allergies    Medications and allergies reviewed with patient and updated if appropriate.  Patient Active Problem List   Diagnosis Date Noted  . Vitamin D deficiency 10/02/2015  . Acute sinusitis 07/20/2015  . Other fatigue 04/07/2015  . History of colonic polyps 08/06/2014  . Thrombocytopenia due to drugs 02/18/2014  . Leukopenia due to antineoplastic chemotherapy ([place]) 02/18/2014  . 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 Other abnormal glucose 07/30/2012  . History of breast cancer 07/18/2012  . Osteopenia 09/15/2011  . Diarrhea 08/29/2011  . Family history of malignant neoplasm of gastrointestinal tract 08/29/2011  . HYPERLIPIDEMIA 04/14/2009  . Pancytopenia due to antineoplastic chemotherapy ([place]) 04/14/2009  . VISUAL IMPAIRMENT, TRANSIENT 04/14/2009  . RECTOVAGINAL FISTULA 11/10/2008  . DIVERTICULOSIS OF COLON 04/06/2008  . Chronic myeloid leukemia ([place]) 02/18/2007  . ROSACEA 02/18/2007    Current Outpatient Prescriptions on File Prior to Visit  Medication Sig Dispense Refill  . aspirin 81 MG tablet Take 81 mg by mouth daily.    . diclofenac sodium (VOLTAREN) 1 % GEL Apply 2 g topically 4 (four) times daily. (Patient  taking differently: Apply 2 g topically 4 (four) times daily as needed. ) 100 g 5  . estradiol (ESTRACE VAGINAL) 0.1 MG/GM vaginal cream Place 1 Applicatorful vaginally 2 (two) times a week. 123XX123 g 5  . folic acid (FOLVITE) 1 MG tablet TAKE 1 TABLET BY MOUTH EVERY DAY 90 tablet 0  . glucosamine-chondroitin 500-400 MG tablet Take 1 tablet by mouth 2 (two) times daily.    Marland Kitchen ibuprofen (ADVIL,MOTRIN) 200 MG tablet Take 200 mg by mouth every 6 (six) hours as needed for moderate pain.    Marland Kitchen imatinib (GLEEVEC) 100 MG tablet TAKE 3 TABLETS BY MOUTH EVERY DAY WITH MEALS AND A GLASS OF WATER 270 tablet 3  . Loperamide HCl (IMODIUM PO) Take by mouth daily as needed.     . Multiple Vitamin (MULTIVITAMIN) tablet Take 1 tablet by mouth daily.    . Probiotic Product (ALIGN) 4 MG CAPS Take 1 capsule by mouth daily.     No current facility-administered medications on file prior to visit.     Past Medical History:  Diagnosis Date  . Anemia requiring transfusions 2010 & 2015  . Arthritis    DJD  . Breast cancer ([place]) 1993   S/P lumpectomy , radiation, & Tamoxifen  . CML (chronic myeloid leukemia) (HCC)    Dr Alvy Bimler    Past Surgical History:  Procedure Laterality Date  . ABDOMINAL HYSTERECTOMY  ~1989   no BSO, for Dysplasia   . APPENDECTOMY  ~1989  . BILATERAL SALPINGOOPHORECTOMY  2010  with bowel resection  . BREAST SURGERY     Right lumpectomy, s/p radiation 1993 and oral  Tamoxifen x 5 years [id number])   . CATARACT EXTRACTION  06/08/2014   left eye;Dr Herbert Deaner  . COLONOSCOPY  03/2003, last 2010   diverticulosis, Dr.Kaplan  . EXCISION MORTON'S NEUROMA Left mid 80's   foot  . gastrocslide     left leg; Dr Beola Cord  . ileostomy reversal  2011   3 mos after above  . KNEE SURGERY  mid 80's   Arthroscopy, right knee   . LEG TENDON SURGERY Left 2013   "cut on leg and fixed something"  . sigmoid colon resection  2010    Dr Arlyce Dice, [place] for fistula & diverticulitis  . TOTAL KNEE  ARTHROPLASTY Right 06/11/2013   Procedure: RIGHT TOTAL KNEE ARTHROPLASTY;  Surgeon: Sydnee Cabal, MD;  Location: [facility];  Service: Orthopedics;  Laterality: Right;  . TRIGGER FINGER RELEASE Left 2013   thumb    Social History   Social History  . Marital status: Married    Spouse name: N/A  . Number of children: N/A  . Years of education: N/A   Social History Main Topics  . Smoking status: Former Smoker    Years: 5.00    Types: Cigarettes    Quit date: 04/30/1962  . Smokeless tobacco: Never Used     Comment: smoked 1956-1964, up to 1/2 ppd  . Alcohol use 4.2 - 8.4 oz/week    7 - 14 Glasses of wine per week  . Drug use: No  . Sexual activity: Yes   Other Topics Concern  . Not on file   Social History Narrative  . No narrative on file    Family History  Problem Relation Age of Onset  . Colon cancer Mother 86  . Uterine cancer Sister   . Diabetes Cousin   . Cancer Father     Lip cancer  . Breast cancer Paternal Aunt   . Heart attack Paternal Uncle 49  . Stroke Neg Hx   . Esophageal cancer Neg Hx   . Rectal cancer Neg Hx   . Stomach cancer Neg Hx   . Aneurysm Father     AAA;S/P surgery    Review of Systems  Constitutional: Negative for fever.  HENT: Positive for postnasal drip and sinus pressure (frontal sinuses). Negative for congestion, ear pain (no ear pressure, plugged sensation), hearing loss and sore throat.   Respiratory: Negative for cough, shortness of breath and wheezing.   Neurological: Positive for headaches.       Objective:   Vitals:   02/17/16 1346  BP: 126/86  Pulse: 65  Resp: 16  Temp: 98.3 F (36.8 C)   Filed Weights   02/17/16 1346  Weight: 124 lb (56.2 kg)   Body mass index is 23.05 kg/m.   Physical Exam  Constitutional: She appears well-developed and well-nourished. No distress.  HENT:  Head: Normocephalic and atraumatic.  Right Ear: External ear normal.  Left Ear: External ear normal.  Nose: Nose normal.  Mouth/Throat:  Oropharynx is clear and moist. No oropharyngeal exudate.  Normal bilateral ear canals and tympanic membranes  Eyes: Conjunctivae are normal.  Neck: Neck supple. No tracheal deviation present. No thyromegaly present.  Cardiovascular: Normal rate, regular rhythm and normal heart sounds.   Pulmonary/Chest: Effort normal and breath sounds normal. No respiratory distress. She has no wheezes. She has no rales.  Lymphadenopathy:    She has no cervical adenopathy.  Skin:  She is not diaphoretic.   GENERAL APPEARANCE: Appears stated age, well appearing, NAD EYES: conjunctiva clear, no icterus HEENT: bilateral tympanic membranes and ear canals normal, oropharynx with no erythema, no thyromegaly, trachea midline, no cervical or supraclavicular lymphadenopathy LUNGS: Clear to auscultation without wheeze or crackles, unlabored breathing, good air entry bilaterally HEART: Normal S1,S2 without murmurs EXTREMITIES: Without clubbing, cyanosis, or edema        Assessment & Plan:   See Problem List for Assessment and Plan of chronic medical problems.

## 2016-02-17 ENCOUNTER — Encounter: Payer: Self-pay | Admitting: Internal Medicine

## 2016-02-17 ENCOUNTER — Ambulatory Visit (INDEPENDENT_AMBULATORY_CARE_PROVIDER_SITE_OTHER): Payer: Medicare Other | Admitting: Internal Medicine

## 2016-02-17 ENCOUNTER — Ambulatory Visit (INDEPENDENT_AMBULATORY_CARE_PROVIDER_SITE_OTHER)
Admission: RE | Admit: 2016-02-17 | Discharge: 2016-02-17 | Disposition: A | Discharge status: Home / Self Care | Payer: Medicare Other | Source: Ambulatory Visit | Attending: Internal Medicine | Admitting: Internal Medicine

## 2016-02-17 VITALS — BP 126/86 | HR 65 | Temp 98.3°F | Resp 16 | Wt 124.0 lb

## 2016-02-17 DIAGNOSIS — J3489 Other specified disorders of nose and nasal sinuses: Secondary | ICD-10-CM | POA: Diagnosis not present

## 2016-02-17 DIAGNOSIS — Z23 Encounter for immunization: Secondary | ICD-10-CM

## 2016-02-17 LAB — HM DEXA SCAN

## 2016-02-17 NOTE — Patient Instructions (Signed)
Have an xray done  Test(s) ordered today. Your results will be released to [place] (or called to you) after review, usually within 72hours after test completion. If any changes need to be made, you will be notified at that same time.   Flu vaccine administered today.   Medications reviewed and updated.  Changes include trying flonase and saline nasal sprays.

## 2016-02-17 NOTE — Progress Notes (Signed)
Pre visit review using our clinic review tool, if applicable. No additional management support is needed unless otherwise documented below in the visit note. 

## 2016-02-17 NOTE — Assessment & Plan Note (Signed)
Present for one year - frontal or ethmoidal sinuses No congestion or sinus infection Trial of flonase, saline nasal spray advil or tylenol prn X-ray of sinus Can refer to ENT if no improvement

## 2016-02-26 ENCOUNTER — Encounter: Payer: Self-pay | Admitting: Internal Medicine

## 2016-02-27 ENCOUNTER — Other Ambulatory Visit (HOSPITAL_BASED_OUTPATIENT_CLINIC_OR_DEPARTMENT_OTHER): Payer: Medicare Other

## 2016-02-27 DIAGNOSIS — C9210 Chronic myeloid leukemia, BCR/ABL-positive, not having achieved remission: Secondary | ICD-10-CM | POA: Diagnosis not present

## 2016-02-27 LAB — CBC WITH DIFFERENTIAL/PLATELET
BASO%: 1.1 % (ref 0.0–2.0)
Basophils Absolute: 0 10*3/uL (ref 0.0–0.1)
EOS%: 3.3 % (ref 0.0–7.0)
Eosinophils Absolute: 0.1 10*3/uL (ref 0.0–0.5)
HEMATOCRIT: 32.5 % — AB (ref 34.8–46.6)
HGB: 10.8 g/dL — ABNORMAL LOW (ref 11.6–15.9)
LYMPH#: 1 10*3/uL (ref 0.9–3.3)
LYMPH%: 36.8 % (ref 14.0–49.7)
MCH: 38 pg — ABNORMAL HIGH (ref 25.1–34.0)
MCHC: 33.4 g/dL (ref 31.5–36.0)
MCV: 114 fL — ABNORMAL HIGH (ref 79.5–101.0)
MONO#: 0.4 10*3/uL (ref 0.1–0.9)
MONO%: 14.8 % — ABNORMAL HIGH (ref 0.0–14.0)
NEUT%: 44 % (ref 38.4–76.8)
NEUTROS ABS: 1.2 10*3/uL — AB (ref 1.5–6.5)
PLATELETS: 145 10*3/uL (ref 145–400)
RBC: 2.85 10*6/uL — ABNORMAL LOW (ref 3.70–5.45)
RDW: 12.7 % (ref 11.2–14.5)
WBC: 2.8 10*3/uL — AB (ref 3.9–10.3)

## 2016-02-27 LAB — COMPREHENSIVE METABOLIC PANEL
ALT: 18 U/L (ref 0–55)
ANION GAP: 8 meq/L (ref 3–11)
AST: 33 U/L (ref 5–34)
Albumin: 3.8 g/dL (ref 3.5–5.0)
Alkaline Phosphatase: 72 U/L (ref 40–150)
BILIRUBIN TOTAL: 0.37 mg/dL (ref 0.20–1.20)
BUN: 27.7 mg/dL — ABNORMAL HIGH (ref 7.0–26.0)
CALCIUM: 9.1 mg/dL (ref 8.4–10.4)
CO2: 28 meq/L (ref 22–29)
CREATININE: 1 mg/dL (ref 0.6–1.1)
Chloride: 106 mEq/L (ref 98–109)
EGFR: 55 mL/min/{1.73_m2} — ABNORMAL LOW (ref >90)
Glucose: 91 mg/dl (ref 70–140)
Potassium: 4.6 mEq/L (ref 3.5–5.1)
Sodium: 142 mEq/L (ref 136–145)
TOTAL PROTEIN: 6.5 g/dL (ref 6.4–8.3)

## 2016-03-05 ENCOUNTER — Telehealth: Payer: Self-pay | Admitting: Hematology and Oncology

## 2016-03-05 ENCOUNTER — Ambulatory Visit (HOSPITAL_BASED_OUTPATIENT_CLINIC_OR_DEPARTMENT_OTHER): Payer: Medicare Other | Admitting: Hematology and Oncology

## 2016-03-05 ENCOUNTER — Encounter: Payer: Self-pay | Admitting: Hematology and Oncology

## 2016-03-05 VITALS — BP 123/67 | HR 84 | Temp 98.3°F | Resp 18 | Ht 61.5 in | Wt 122.0 lb

## 2016-03-05 DIAGNOSIS — D61810 Antineoplastic chemotherapy induced pancytopenia: Secondary | ICD-10-CM

## 2016-03-05 DIAGNOSIS — Z853 Personal history of malignant neoplasm of breast: Secondary | ICD-10-CM

## 2016-03-05 DIAGNOSIS — C9210 Chronic myeloid leukemia, BCR/ABL-positive, not having achieved remission: Secondary | ICD-10-CM

## 2016-03-05 DIAGNOSIS — T451X5A Adverse effect of antineoplastic and immunosuppressive drugs, initial encounter: Secondary | ICD-10-CM

## 2016-03-05 NOTE — Telephone Encounter (Signed)
Appointments scheduled per 03/05/16 los. AVS report and appointment schedule given to patient, per 03/05/16 los. °

## 2016-03-05 NOTE — Progress Notes (Signed)
[facility] [place] OFFICE PROGRESS NOTE  Patient Care Team: Binnie Rail, MD as PCP - General (Internal Medicine)  SUMMARY OF ONCOLOGIC HISTORY:   Chronic myeloid leukemia ([place])   02/18/2007 Initial Diagnosis    Chronic myeloid leukemia      08/20/2014 Tumor Marker    BCR/ABL is undetectable      03/04/2015 Pathology Results    BCR/ABL is undetectable      08/29/2015 Tumor Marker    BCR/ABL is undetectable      02/29/2016 Pathology Results    BCR/ABL is undetectable      This patient was diagnosed with breast cancer in 1993. At the time she had lumpectomy and radiation therapy. I do not know the stage of her breast cancer but she has no evidence of recurrence. The patient was diagnosed with CML after routine blood work revealed leukocytosis. She was placed on Gleevec long-term. She has mild diarrhea on Gleevec.  INTERVAL HISTORY: Please see below for problem oriented charting. She feels well. She complained of recent hair loss Recent infection She denies any recent abnormal breast examination, palpable mass, abnormal breast appearance or nipple changes   REVIEW OF SYSTEMS:   Constitutional: Denies fevers, chills or abnormal weight loss Eyes: Denies blurriness of vision Ears, nose, mouth, throat, and face: Denies mucositis or sore throat Respiratory: Denies cough, dyspnea or wheezes Cardiovascular: Denies palpitation, chest discomfort or lower extremity swelling Gastrointestinal:  Denies nausea, heartburn or change in bowel habits Skin: Denies abnormal skin rashes Lymphatics: Denies new lymphadenopathy or easy bruising Neurological:Denies numbness, tingling or new weaknesses Behavioral/Psych: Mood is stable, no new changes  All other systems were reviewed with the patient and are negative.  I have reviewed the past medical history, past surgical history, social history and family history with the patient and they are unchanged from previous  note.  ALLERGIES:  is allergic to penicillins.  MEDICATIONS:  Current Outpatient Prescriptions  Medication Sig Dispense Refill  . aspirin 81 MG tablet Take 81 mg by mouth daily.    . diclofenac sodium (VOLTAREN) 1 % GEL Apply 2 g topically 4 (four) times daily. (Patient taking differently: Apply 2 g topically 4 (four) times daily as needed. ) 100 g 5  . estradiol (ESTRACE VAGINAL) 0.1 MG/GM vaginal cream Place 1 Applicatorful vaginally 2 (two) times a week. 123XX123 g 5  . folic acid (FOLVITE) 1 MG tablet TAKE 1 TABLET BY MOUTH EVERY DAY 90 tablet 0  . glucosamine-chondroitin 500-400 MG tablet Take 1 tablet by mouth 2 (two) times daily.    Marland Kitchen ibuprofen (ADVIL,MOTRIN) 200 MG tablet Take 200 mg by mouth every 6 (six) hours as needed for moderate pain.    Marland Kitchen imatinib (GLEEVEC) 100 MG tablet TAKE 3 TABLETS BY MOUTH EVERY DAY WITH MEALS AND A GLASS OF WATER 270 tablet 3  . Loperamide HCl (IMODIUM PO) Take by mouth daily as needed.     . Multiple Vitamin (MULTIVITAMIN) tablet Take 1 tablet by mouth daily.    . Probiotic Product (ALIGN) 4 MG CAPS Take 1 capsule by mouth daily.     No current facility-administered medications for this visit.     PHYSICAL EXAMINATION: ECOG PERFORMANCE STATUS: 1 - Symptomatic but completely ambulatory  Vitals:   03/05/16 1232  BP: 123/67  Pulse: 84  Resp: 18  Temp: 98.3 F (36.8 C)   Filed Weights   03/05/16 1232  Weight: 122 lb (55.3 kg)    GENERAL:alert, no distress and comfortable SKIN:  skin color, texture, turgor are normal, no rashes or significant lesions EYES: normal, Conjunctiva are pink and non-injected, sclera clear Musculoskeletal:no cyanosis of digits and no clubbing  NEURO: alert & oriented x 3 with fluent speech, no focal motor/sensory deficits  LABORATORY DATA:  I have reviewed the data as listed    Component Value Date/Time   NA 142 02/27/2016 0817   K 4.6 02/27/2016 0817   CL 106 08/17/2015 0851   CL 105 09/04/2012 1006   CO2 28  02/27/2016 0817   GLUCOSE 91 02/27/2016 0817   GLUCOSE 96 09/04/2012 1006   BUN 27.7 (H) 02/27/2016 0817   CREATININE 1.0 02/27/2016 0817   CALCIUM 9.1 02/27/2016 0817   PROT 6.5 02/27/2016 0817   ALBUMIN 3.8 02/27/2016 0817   AST 33 02/27/2016 0817   ALT 18 02/27/2016 0817   ALKPHOS 72 02/27/2016 0817   BILITOT 0.37 02/27/2016 0817   GFRNONAA 79 (L) 06/12/2013 0452   GFRAA >90 06/12/2013 0452    No results found for: SPEP, UPEP  Lab Results  Component Value Date   WBC 2.8 (L) 02/27/2016   NEUTROABS 1.2 (L) 02/27/2016   HGB 10.8 (L) 02/27/2016   HCT 32.5 (L) 02/27/2016   MCV 114.0 (H) 02/27/2016   PLT 145 02/27/2016      Chemistry      Component Value Date/Time   NA 142 02/27/2016 0817   K 4.6 02/27/2016 0817   CL 106 08/17/2015 0851   CL 105 09/04/2012 1006   CO2 28 02/27/2016 0817   BUN 27.7 (H) 02/27/2016 0817   CREATININE 1.0 02/27/2016 0817      Component Value Date/Time   CALCIUM 9.1 02/27/2016 0817   ALKPHOS 72 02/27/2016 0817   AST 33 02/27/2016 0817   ALT 18 02/27/2016 0817   BILITOT 0.37 02/27/2016 0817       ASSESSMENT & PLAN:  Chronic myeloid leukemia Her last BCR/ABL showed she is in molecular remission.  I reviewed with her the current guidelines regarding discontinuation of treatment. That would require commitment to come here on a monthly basis for blood work for minimum of 6 months. The patient is interested to try I recommend taper dose to 200 mg week and then 100 mg for 1 week and then stop I will bring her back to get monthly blood work and see her back in 3 months  Pancytopenia due to antineoplastic chemotherapy [facility]) This is likely due to recent treatment. The patient denies recent history of bleeding such as epistaxis, hematuria or hematochezia. She is asymptomatic from the anemia & thrombocytopenia. I will observe for now. I recommend slow taper of Gleevec and then stop altogether   History of breast cancer Her recent mammogram  was negative for recurrence.   Orders Placed This Encounter  Procedures  . CBC with Differential/Platelet    Standing Status:   Standing    Number of Occurrences:   9    Standing Expiration Date:   03/05/2017  . Comprehensive metabolic panel    Standing Status:   Standing    Number of Occurrences:   9    Standing Expiration Date:   03/05/2017  . BCR-ABL    With RT-PCR technique    Standing Status:   Standing    Number of Occurrences:   9    Standing Expiration Date:   03/05/2017   All questions were answered. The patient knows to call the clinic with any problems, questions or concerns. No barriers to  learning was detected. I spent 15 minutes counseling the patient face to face. The total time spent in the appointment was 20 minutes and more than 50% was on counseling and review of test results     Heath Lark, MD 03/05/2016 1:30 PM

## 2016-03-05 NOTE — Assessment & Plan Note (Signed)
Her last BCR/ABL showed she is in molecular remission.  I reviewed with her the current guidelines regarding discontinuation of treatment. That would require commitment to come here on a monthly basis for blood work for minimum of 6 months. The patient is interested to try I recommend taper dose to 200 mg week and then 100 mg for 1 week and then stop I will bring her back to get monthly blood work and see her back in 3 months

## 2016-03-05 NOTE — Assessment & Plan Note (Signed)
This is likely due to recent treatment. The patient denies recent history of bleeding such as epistaxis, hematuria or hematochezia. She is asymptomatic from the anemia & thrombocytopenia. I will observe for now. I recommend slow taper of Gleevec and then stop altogether

## 2016-03-05 NOTE — Assessment & Plan Note (Signed)
Her recent mammogram was negative for recurrence. 

## 2016-03-08 ENCOUNTER — Encounter: Payer: Self-pay | Admitting: Internal Medicine

## 2016-03-30 ENCOUNTER — Ambulatory Visit: Payer: Medicare Other | Admitting: Nurse Practitioner

## 2016-03-30 ENCOUNTER — Other Ambulatory Visit (HOSPITAL_BASED_OUTPATIENT_CLINIC_OR_DEPARTMENT_OTHER): Payer: Medicare Other

## 2016-03-30 DIAGNOSIS — C9210 Chronic myeloid leukemia, BCR/ABL-positive, not having achieved remission: Secondary | ICD-10-CM

## 2016-03-30 LAB — CBC WITH DIFFERENTIAL/PLATELET
BASO%: 0.5 % (ref 0.0–2.0)
Basophils Absolute: 0 10*3/uL (ref 0.0–0.1)
EOS%: 3.5 % (ref 0.0–7.0)
Eosinophils Absolute: 0.2 10*3/uL (ref 0.0–0.5)
HEMATOCRIT: 32 % — AB (ref 34.8–46.6)
HGB: 10.9 g/dL — ABNORMAL LOW (ref 11.6–15.9)
LYMPH#: 1.2 10*3/uL (ref 0.9–3.3)
LYMPH%: 27.6 % (ref 14.0–49.7)
MCH: 37.5 pg — ABNORMAL HIGH (ref 25.1–34.0)
MCHC: 34.1 g/dL (ref 31.5–36.0)
MCV: 110 fL — ABNORMAL HIGH (ref 79.5–101.0)
MONO#: 0.7 10*3/uL (ref 0.1–0.9)
MONO%: 15 % — ABNORMAL HIGH (ref 0.0–14.0)
NEUT%: 53.4 % (ref 38.4–76.8)
NEUTROS ABS: 2.3 10*3/uL (ref 1.5–6.5)
Platelets: 173 10*3/uL (ref 145–400)
RBC: 2.91 10*6/uL — ABNORMAL LOW (ref 3.70–5.45)
RDW: 13 % (ref 11.2–14.5)
WBC: 4.3 10*3/uL (ref 3.9–10.3)

## 2016-03-30 LAB — COMPREHENSIVE METABOLIC PANEL
ALT: 15 U/L (ref 0–55)
AST: 24 U/L (ref 5–34)
Albumin: 3.9 g/dL (ref 3.5–5.0)
Alkaline Phosphatase: 56 U/L (ref 40–150)
Anion Gap: 10 mEq/L (ref 3–11)
BUN: 26.4 mg/dL — AB (ref 7.0–26.0)
CALCIUM: 9.6 mg/dL (ref 8.4–10.4)
CHLORIDE: 106 meq/L (ref 98–109)
CO2: 27 meq/L (ref 22–29)
CREATININE: 0.8 mg/dL (ref 0.6–1.1)
EGFR: 67 mL/min/{1.73_m2} — ABNORMAL LOW (ref >90)
Glucose: 102 mg/dl (ref 70–140)
Potassium: 4.4 mEq/L (ref 3.5–5.1)
Sodium: 142 mEq/L (ref 136–145)
Total Bilirubin: 0.7 mg/dL (ref 0.20–1.20)
Total Protein: 6.7 g/dL (ref 6.4–8.3)

## 2016-04-04 ENCOUNTER — Telehealth: Payer: Self-pay | Admitting: *Deleted

## 2016-04-04 NOTE — Telephone Encounter (Signed)
Pt notified of negative BCR ABL test

## 2016-04-11 ENCOUNTER — Ambulatory Visit (INDEPENDENT_AMBULATORY_CARE_PROVIDER_SITE_OTHER): Payer: Medicare Other | Admitting: Internal Medicine

## 2016-04-11 ENCOUNTER — Encounter: Payer: Self-pay | Admitting: Internal Medicine

## 2016-04-11 VITALS — BP 132/70 | HR 84 | Temp 98.0°F | Resp 16 | Wt 119.0 lb

## 2016-04-11 DIAGNOSIS — R194 Change in bowel habit: Secondary | ICD-10-CM | POA: Diagnosis not present

## 2016-04-11 DIAGNOSIS — R32 Unspecified urinary incontinence: Secondary | ICD-10-CM | POA: Diagnosis not present

## 2016-04-11 DIAGNOSIS — L299 Pruritus, unspecified: Secondary | ICD-10-CM | POA: Diagnosis not present

## 2016-04-11 DIAGNOSIS — L989 Disorder of the skin and subcutaneous tissue, unspecified: Secondary | ICD-10-CM | POA: Diagnosis not present

## 2016-04-11 DIAGNOSIS — R198 Other specified symptoms and signs involving the digestive system and abdomen: Secondary | ICD-10-CM

## 2016-04-11 NOTE — Progress Notes (Signed)
Subjective:    Patient ID: [name] [name], female    DOB: 1935-04-10, 81 y.o.   MRN: [id number]  HPI She is here for an acute visit.  Initially she scheduled the appointment to requested a referral to ENT, but those symptoms have improved. She does have some other concerns.  Scalp itching: Her scalp itches at times. She typically feels it every couple of days. She denies any bumps on her scalp, scalp pain or flaky skin from her scalp. She has also lost some of her hair in the posterior, upper aspect of her head. She knows this may be related to age. She has not changed any of her hair products. Her symptoms are mild, but wanted to mention it.  Blister, right thumb: She is a small blister on the lateral aspect of her right thumb. It is not painful. It has not changed. She did have one similar blister on her other thumb that did go away. She just wanted to make sure it was okay.  She does have a partial torn right rotator cuff. She saw Dr. Theda Sers will be starting physical therapy soon.  She does feel she has some anal and bladder sphincter weakness. She denies fecal incontinence, but after having a bowel movement she will need to return to the bathroom to have another bowel movement. She denies any true leakage. She does have urinary incontinence. She also notices that when she realizes her bladder is full she is unable to make it to the bathroom. She has been changing her underwear a few times a day. She denies any concerning urinary symptoms consistent with infection. She has done tea a exercises in the past and she thinks it has helped. She had her last colonoscopy in 2016.    Medications and allergies reviewed with patient and updated if appropriate.  Patient Active Problem List   Diagnosis Date Noted  . Sinus pressure 02/17/2016  . Vitamin D deficiency 10/02/2015  . Acute sinusitis 07/20/2015  . Other fatigue 04/07/2015  . History of colonic polyps 08/06/2014  . Thrombocytopenia  due to drugs 02/18/2014  . Leukopenia due to antineoplastic chemotherapy ([place]) 02/18/2014  . 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 Other abnormal glucose 07/30/2012  . History of breast cancer 07/18/2012  . Osteopenia 09/15/2011  . Diarrhea 08/29/2011  . Family history of malignant neoplasm of gastrointestinal tract 08/29/2011  . HYPERLIPIDEMIA 04/14/2009  . Pancytopenia due to antineoplastic chemotherapy ([place]) 04/14/2009  . VISUAL IMPAIRMENT, TRANSIENT 04/14/2009  . RECTOVAGINAL FISTULA 11/10/2008  . DIVERTICULOSIS OF COLON 04/06/2008  . Chronic myeloid leukemia ([place]) 02/18/2007  . ROSACEA 02/18/2007    Current Outpatient Prescriptions on File Prior to Visit  Medication Sig Dispense Refill  . aspirin 81 MG tablet Take 81 mg by mouth daily.    . diclofenac sodium (VOLTAREN) 1 % GEL Apply 2 g topically 4 (four) times daily. (Patient taking differently: Apply 2 g topically 4 (four) times daily as needed. ) 100 g 5  . estradiol (ESTRACE VAGINAL) 0.1 MG/GM vaginal cream Place 1 Applicatorful vaginally 2 (two) times a week. 123XX123 g 5  . folic acid (FOLVITE) 1 MG tablet TAKE 1 TABLET BY MOUTH EVERY DAY 90 tablet 0  . glucosamine-chondroitin 500-400 MG tablet Take 1 tablet by mouth 2 (two) times daily.    Marland Kitchen ibuprofen (ADVIL,MOTRIN) 200 MG tablet Take 200 mg by mouth every 6 (six) hours as needed for moderate pain.    . Loperamide HCl (IMODIUM PO) Take by  mouth daily as needed.     . Multiple Vitamin (MULTIVITAMIN) tablet Take 1 tablet by mouth daily.    . Probiotic Product (ALIGN) 4 MG CAPS Take 1 capsule by mouth daily.     No current facility-administered medications on file prior to visit.     Past Medical History:  Diagnosis Date  . Anemia requiring transfusions 2010 & 2015  . Arthritis    DJD  . Breast cancer ([place]) 1993   S/P lumpectomy , radiation, & Tamoxifen  . CML (chronic myeloid leukemia) (HCC)    Dr Alvy Bimler    Past Surgical History:  Procedure Laterality Date  . ABDOMINAL  HYSTERECTOMY  ~1989   no BSO, for Dysplasia   . APPENDECTOMY  ~1989  . BILATERAL SALPINGOOPHORECTOMY  2010   with bowel resection  . BREAST SURGERY     Right lumpectomy, s/p radiation 1993 and oral  Tamoxifen x 5 years [id number])   . CATARACT EXTRACTION  06/08/2014   left eye;Dr Herbert Deaner  . COLONOSCOPY  03/2003, last 2010   diverticulosis, Dr.Kaplan  . EXCISION MORTON'S NEUROMA Left mid 80's   foot  . gastrocslide     left leg; Dr Beola Cord  . ileostomy reversal  2011   3 mos after above  . KNEE SURGERY  mid 80's   Arthroscopy, right knee   . LEG TENDON SURGERY Left 2013   "cut on leg and fixed something"  . sigmoid colon resection  2010    Dr Arlyce Dice, [place] for fistula & diverticulitis  . TOTAL KNEE ARTHROPLASTY Right 06/11/2013   Procedure: RIGHT TOTAL KNEE ARTHROPLASTY;  Surgeon: Sydnee Cabal, MD;  Location: [facility];  Service: Orthopedics;  Laterality: Right;  . TRIGGER FINGER RELEASE Left 2013   thumb    Social History   Social History  . Marital status: Married    Spouse name: N/A  . Number of children: N/A  . Years of education: N/A   Social History Main Topics  . Smoking status: Former Smoker    Years: 5.00    Types: Cigarettes    Quit date: 04/30/1962  . Smokeless tobacco: Never Used     Comment: smoked 1956-1964, up to 1/2 ppd  . Alcohol use 4.2 - 8.4 oz/week    7 - 14 Glasses of wine per week  . Drug use: No  . Sexual activity: Yes   Other Topics Concern  . None   Social History Narrative  . None    Family History  Problem Relation Age of Onset  . Colon cancer Mother 40  . Uterine cancer Sister   . Diabetes Cousin   . Cancer Father     Lip cancer  . Aneurysm Father     AAA;S/P surgery  . Breast cancer Paternal Aunt   . Heart attack Paternal Uncle 57  . Stroke Neg Hx   . Esophageal cancer Neg Hx   . Rectal cancer Neg Hx   . Stomach cancer Neg Hx     Review of Systems  Constitutional: Negative for fever.  Gastrointestinal: Negative for  abdominal pain, blood in stool, constipation and diarrhea.  Genitourinary: Negative for difficulty urinating, dysuria and hematuria.       Incontinence  Musculoskeletal: Negative for back pain.       Objective:   Vitals:   04/11/16 1512  BP: 132/70  Pulse: 84  Resp: 16  Temp: 98 F (36.7 C)   Filed Weights   04/11/16 1512  Weight: 119 lb (54  kg)   Body mass index is 22.12 kg/m.   Physical Exam  Constitutional: She appears well-developed and well-nourished. No distress.  Genitourinary:  Genitourinary Comments: Rectal exam deferred  Musculoskeletal: She exhibits no edema.  Skin: Skin is warm and dry. No rash noted. She is not diaphoretic.  Benign-appearing cyst lateral aspect of right thumb-possible mucous cyst; scalp without erythema, lesions or flaking of skin, mild thinning of hair posterior head          Assessment & Plan:   See Problem List for Assessment and Plan of chronic medical problems.

## 2016-04-11 NOTE — Assessment & Plan Note (Signed)
No true fecal incontinence Having what sounds like incomplete bowel movements or needing to go twice in a small period of time Colonoscopy last year Can refer to GI Possible pelvic muscle weakness, but no true incontinence We will try increasing fiber to see if that helps Call or return if no improvement

## 2016-04-11 NOTE — Progress Notes (Signed)
Pre visit review using our clinic review tool, if applicable. No additional management support is needed unless otherwise documented below in the visit note. 

## 2016-04-11 NOTE — Assessment & Plan Note (Signed)
Scalp appears healthy without erythema, lesions or dryness Her symptoms are fairly mild She will try changing some of her products If symptoms worsen or do not improve can consider ketoconazole or steroid-based shampoo

## 2016-04-11 NOTE — Assessment & Plan Note (Signed)
Benign-appearing blister lesion of right thumb. She has had a similar lesion on her left thumb that resolved on its own line monitor for now-appears benign

## 2016-04-11 NOTE — Patient Instructions (Addendum)
   Medications reviewed and updated.  No changes recommended at this time.     

## 2016-04-11 NOTE — Assessment & Plan Note (Signed)
Likely mixed incontinence Discussed pelvic PT-deferred for now She will try doing Teagle exercises Avoid having a full bladder Discussed medications-deferred for now Can refer to urology if she wishes

## 2016-04-16 ENCOUNTER — Other Ambulatory Visit: Payer: Self-pay | Admitting: Hematology and Oncology

## 2016-04-16 ENCOUNTER — Encounter: Payer: Self-pay | Admitting: Internal Medicine

## 2016-05-01 ENCOUNTER — Other Ambulatory Visit (HOSPITAL_BASED_OUTPATIENT_CLINIC_OR_DEPARTMENT_OTHER): Payer: Medicare Other

## 2016-05-01 DIAGNOSIS — C9210 Chronic myeloid leukemia, BCR/ABL-positive, not having achieved remission: Secondary | ICD-10-CM

## 2016-05-01 LAB — CBC WITH DIFFERENTIAL/PLATELET
BASO%: 0.8 % (ref 0.0–2.0)
BASOS ABS: 0 10*3/uL (ref 0.0–0.1)
EOS ABS: 0.2 10*3/uL (ref 0.0–0.5)
EOS%: 5.9 % (ref 0.0–7.0)
HCT: 36.8 % (ref 34.8–46.6)
HEMOGLOBIN: 12.3 g/dL (ref 11.6–15.9)
LYMPH%: 29.5 % (ref 14.0–49.7)
MCH: 36.3 pg — AB (ref 25.1–34.0)
MCHC: 33.4 g/dL (ref 31.5–36.0)
MCV: 108.6 fL — AB (ref 79.5–101.0)
MONO#: 0.6 10*3/uL (ref 0.1–0.9)
MONO%: 15.7 % — AB (ref 0.0–14.0)
NEUT#: 1.8 10*3/uL (ref 1.5–6.5)
NEUT%: 48.1 % (ref 38.4–76.8)
PLATELETS: 184 10*3/uL (ref 145–400)
RBC: 3.39 10*6/uL — ABNORMAL LOW (ref 3.70–5.45)
RDW: 12.5 % (ref 11.2–14.5)
WBC: 3.7 10*3/uL — ABNORMAL LOW (ref 3.9–10.3)
lymph#: 1.1 10*3/uL (ref 0.9–3.3)

## 2016-05-01 LAB — COMPREHENSIVE METABOLIC PANEL
ALBUMIN: 4.3 g/dL (ref 3.5–5.0)
ALK PHOS: 53 U/L (ref 40–150)
ALT: 17 U/L (ref 0–55)
ANION GAP: 9 meq/L (ref 3–11)
AST: 24 U/L (ref 5–34)
BUN: 28.6 mg/dL — ABNORMAL HIGH (ref 7.0–26.0)
CO2: 30 mEq/L — ABNORMAL HIGH (ref 22–29)
Calcium: 10.6 mg/dL — ABNORMAL HIGH (ref 8.4–10.4)
Chloride: 102 mEq/L (ref 98–109)
Creatinine: 1 mg/dL (ref 0.6–1.1)
EGFR: 52 mL/min/{1.73_m2} — AB (ref >90)
GLUCOSE: 86 mg/dL (ref 70–140)
POTASSIUM: 4.8 meq/L (ref 3.5–5.1)
SODIUM: 141 meq/L (ref 136–145)
Total Bilirubin: 0.65 mg/dL (ref 0.20–1.20)
Total Protein: 7.4 g/dL (ref 6.4–8.3)

## 2016-05-09 ENCOUNTER — Telehealth: Payer: Self-pay | Admitting: Hematology and Oncology

## 2016-05-09 NOTE — Telephone Encounter (Signed)
Spoke with the patient. Unfortunately recent blood work detected BCR/ABL I recommend we resume treatment with Force. She has supply at home I recommend we keep all her appt as scheduled

## 2016-05-28 ENCOUNTER — Telehealth: Payer: Self-pay | Admitting: *Deleted

## 2016-05-28 NOTE — Telephone Encounter (Signed)
Pt states she has restarted Gleevec. Has appt for lab and Dr Alvy Bimler on 3/6. Does Dr Alvy Bimler wants labs a week prior to 3/6 appt, rather than same day?   Will be here 2/1 for labs only

## 2016-05-28 NOTE — Telephone Encounter (Signed)
Keep the appt as is If the February result is OK, we do not need an earlier appt for March

## 2016-05-28 NOTE — Telephone Encounter (Signed)
Notified of Dr Alvy Bimler message below.

## 2016-05-31 ENCOUNTER — Other Ambulatory Visit (HOSPITAL_BASED_OUTPATIENT_CLINIC_OR_DEPARTMENT_OTHER): Payer: Medicare Other

## 2016-05-31 DIAGNOSIS — D61810 Antineoplastic chemotherapy induced pancytopenia: Secondary | ICD-10-CM

## 2016-05-31 DIAGNOSIS — C9210 Chronic myeloid leukemia, BCR/ABL-positive, not having achieved remission: Secondary | ICD-10-CM | POA: Diagnosis not present

## 2016-05-31 LAB — COMPREHENSIVE METABOLIC PANEL
ALK PHOS: 57 U/L (ref 40–150)
ALT: 17 U/L (ref 0–55)
AST: 31 U/L (ref 5–34)
Albumin: 3.9 g/dL (ref 3.5–5.0)
Anion Gap: 7 mEq/L (ref 3–11)
BUN: 23.9 mg/dL (ref 7.0–26.0)
CALCIUM: 9.3 mg/dL (ref 8.4–10.4)
CO2: 29 mEq/L (ref 22–29)
Chloride: 104 mEq/L (ref 98–109)
Creatinine: 1 mg/dL (ref 0.6–1.1)
EGFR: 55 mL/min/{1.73_m2} — AB (ref >90)
Glucose: 92 mg/dl (ref 70–140)
POTASSIUM: 4.4 meq/L (ref 3.5–5.1)
Sodium: 140 mEq/L (ref 136–145)
Total Bilirubin: 0.58 mg/dL (ref 0.20–1.20)
Total Protein: 6.2 g/dL — ABNORMAL LOW (ref 6.4–8.3)

## 2016-05-31 LAB — CBC WITH DIFFERENTIAL/PLATELET
BASO%: 0.7 % (ref 0.0–2.0)
BASOS ABS: 0 10*3/uL (ref 0.0–0.1)
EOS ABS: 0.2 10*3/uL (ref 0.0–0.5)
EOS%: 5.6 % (ref 0.0–7.0)
HEMATOCRIT: 33.8 % — AB (ref 34.8–46.6)
HGB: 11.3 g/dL — ABNORMAL LOW (ref 11.6–15.9)
LYMPH%: 39.3 % (ref 14.0–49.7)
MCH: 36.2 pg — AB (ref 25.1–34.0)
MCHC: 33.3 g/dL (ref 31.5–36.0)
MCV: 108.5 fL — AB (ref 79.5–101.0)
MONO#: 0.4 10*3/uL (ref 0.1–0.9)
MONO%: 12.7 % (ref 0.0–14.0)
NEUT#: 1.4 10*3/uL — ABNORMAL LOW (ref 1.5–6.5)
NEUT%: 41.7 % (ref 38.4–76.8)
Platelets: 130 10*3/uL — ABNORMAL LOW (ref 145–400)
RBC: 3.11 10*6/uL — ABNORMAL LOW (ref 3.70–5.45)
RDW: 12.3 % (ref 11.2–14.5)
WBC: 3.3 10*3/uL — ABNORMAL LOW (ref 3.9–10.3)
lymph#: 1.3 10*3/uL (ref 0.9–3.3)

## 2016-06-06 ENCOUNTER — Telehealth: Payer: Self-pay | Admitting: *Deleted

## 2016-06-06 NOTE — Telephone Encounter (Signed)
Pt called back, stated she would be glad to come in sooner if necessary. Message to collaborative nurse.

## 2016-06-07 ENCOUNTER — Other Ambulatory Visit: Payer: Self-pay | Admitting: Hematology and Oncology

## 2016-06-07 DIAGNOSIS — C9210 Chronic myeloid leukemia, BCR/ABL-positive, not having achieved remission: Secondary | ICD-10-CM

## 2016-06-08 ENCOUNTER — Telehealth: Payer: Self-pay | Admitting: *Deleted

## 2016-06-08 NOTE — Telephone Encounter (Signed)
There will be no change to her lab if it is done under 4 weeks with dose change

## 2016-06-08 NOTE — Telephone Encounter (Signed)
Pt states she wants to have labs done sooner than 2/20.  Has been taking Gleevec 300 mg but will increase to 400mg ,

## 2016-06-18 LAB — HM MAMMOGRAPHY

## 2016-06-19 ENCOUNTER — Ambulatory Visit (HOSPITAL_BASED_OUTPATIENT_CLINIC_OR_DEPARTMENT_OTHER): Payer: Medicare Other

## 2016-06-19 DIAGNOSIS — C9210 Chronic myeloid leukemia, BCR/ABL-positive, not having achieved remission: Secondary | ICD-10-CM

## 2016-06-19 LAB — COMPREHENSIVE METABOLIC PANEL
ALBUMIN: 4.2 g/dL (ref 3.5–5.0)
ALK PHOS: 65 U/L (ref 40–150)
ALT: 23 U/L (ref 0–55)
AST: 36 U/L — AB (ref 5–34)
Anion Gap: 10 mEq/L (ref 3–11)
BILIRUBIN TOTAL: 0.63 mg/dL (ref 0.20–1.20)
BUN: 26.5 mg/dL — AB (ref 7.0–26.0)
CALCIUM: 9.7 mg/dL (ref 8.4–10.4)
CO2: 27 mEq/L (ref 22–29)
CREATININE: 1 mg/dL (ref 0.6–1.1)
Chloride: 104 mEq/L (ref 98–109)
EGFR: 51 mL/min/{1.73_m2} — ABNORMAL LOW (ref >90)
GLUCOSE: 114 mg/dL (ref 70–140)
Potassium: 3.9 mEq/L (ref 3.5–5.1)
Sodium: 140 mEq/L (ref 136–145)
TOTAL PROTEIN: 6.7 g/dL (ref 6.4–8.3)

## 2016-06-19 LAB — CBC WITH DIFFERENTIAL/PLATELET
BASO%: 0.5 % (ref 0.0–2.0)
Basophils Absolute: 0 10*3/uL (ref 0.0–0.1)
EOS%: 1.6 % (ref 0.0–7.0)
Eosinophils Absolute: 0.1 10*3/uL (ref 0.0–0.5)
HEMATOCRIT: 33.3 % — AB (ref 34.8–46.6)
HEMOGLOBIN: 11.2 g/dL — AB (ref 11.6–15.9)
LYMPH#: 1.3 10*3/uL (ref 0.9–3.3)
LYMPH%: 25.3 % (ref 14.0–49.7)
MCH: 36.2 pg — ABNORMAL HIGH (ref 25.1–34.0)
MCHC: 33.7 g/dL (ref 31.5–36.0)
MCV: 107.4 fL — ABNORMAL HIGH (ref 79.5–101.0)
MONO#: 0.4 10*3/uL (ref 0.1–0.9)
MONO%: 8.9 % (ref 0.0–14.0)
NEUT%: 63.7 % (ref 38.4–76.8)
NEUTROS ABS: 3.1 10*3/uL (ref 1.5–6.5)
Platelets: 133 10*3/uL — ABNORMAL LOW (ref 145–400)
RBC: 3.1 10*6/uL — ABNORMAL LOW (ref 3.70–5.45)
RDW: 13.3 % (ref 11.2–14.5)
WBC: 4.9 10*3/uL (ref 3.9–10.3)

## 2016-06-27 ENCOUNTER — Telehealth: Payer: Self-pay | Admitting: Hematology and Oncology

## 2016-06-27 ENCOUNTER — Encounter: Payer: Self-pay | Admitting: Hematology and Oncology

## 2016-06-27 ENCOUNTER — Ambulatory Visit (HOSPITAL_BASED_OUTPATIENT_CLINIC_OR_DEPARTMENT_OTHER): Payer: Medicare Other | Admitting: Hematology and Oncology

## 2016-06-27 VITALS — BP 139/83 | HR 81 | Temp 97.8°F | Resp 18 | Ht 61.5 in | Wt 118.4 lb

## 2016-06-27 DIAGNOSIS — R197 Diarrhea, unspecified: Secondary | ICD-10-CM | POA: Diagnosis not present

## 2016-06-27 DIAGNOSIS — D61810 Antineoplastic chemotherapy induced pancytopenia: Secondary | ICD-10-CM

## 2016-06-27 DIAGNOSIS — T451X5A Adverse effect of antineoplastic and immunosuppressive drugs, initial encounter: Secondary | ICD-10-CM

## 2016-06-27 DIAGNOSIS — C9210 Chronic myeloid leukemia, BCR/ABL-positive, not having achieved remission: Secondary | ICD-10-CM

## 2016-06-27 MED ORDER — IMATINIB MESYLATE 100 MG PO TABS: 300.0000 mg | ORAL_TABLET | Freq: Two times a day (BID) | ORAL | 9 refills | Status: DC

## 2016-06-27 NOTE — Progress Notes (Signed)
[facility] OFFICE PROGRESS NOTE  Patient Care Team: Binnie Rail, MD as PCP - General (Internal Medicine)  SUMMARY OF ONCOLOGIC HISTORY:   Chronic myeloid leukemia ([place])   02/18/2007 Initial Diagnosis    Chronic myeloid leukemia      08/20/2014 Tumor Marker    BCR/ABL is undetectable      03/04/2015 Pathology Results    BCR/ABL is undetectable      08/29/2015 Tumor Marker    BCR/ABL is undetectable      02/29/2016 Pathology Results    BCR/ABL is undetectable      05/01/2016 Pathology Results    BCR/ABL b2a2 is detected at 0.03% and IS ratio 0.069%      05/31/2016 Pathology Results    BCR/ABL b2a2 is detected at 0.12% and IS ratio 0.276%      06/19/2016 Pathology Results    BCR/ABL b2a2 is detected at 0.24% and IS ratio 0.552%      06/19/2016 Miscellaneous    ABL mutation kinase testing is negative      06/27/2016 Miscellaneous    Dose of Gleevec is increased to 600 mg daily       INTERVAL HISTORY: Please see below for problem oriented charting. She returns for further follow-up. Since she has increased dose of Gleevec, she noted mild diarrhea. Denies recent infection.  No skin rashes. The patient denies any recent signs or symptoms of bleeding such as spontaneous epistaxis, hematuria or hematochezia.  REVIEW OF SYSTEMS:   Constitutional: Denies fevers, chills or abnormal weight loss Eyes: Denies blurriness of vision Ears, nose, mouth, throat, and face: Denies mucositis or sore throat Respiratory: Denies cough, dyspnea or wheezes Cardiovascular: Denies palpitation, chest discomfort or lower extremity swelling Skin: Denies abnormal skin rashes Lymphatics: Denies new lymphadenopathy or easy bruising Neurological:Denies numbness, tingling or new weaknesses Behavioral/Psych: Mood is stable, no new changes  All other systems were reviewed with the patient and are negative.  I have reviewed the past medical history, past surgical history, social  history and family history with the patient and they are unchanged from previous note.  ALLERGIES:  is allergic to penicillins.  MEDICATIONS:  Current Outpatient Prescriptions  Medication Sig Dispense Refill  . aspirin 81 MG tablet Take 81 mg by mouth daily.    . Biotin 1 MG CAPS Take by mouth daily.    . diclofenac sodium (VOLTAREN) 1 % GEL Apply 2 g topically 4 (four) times daily. (Patient taking differently: Apply 2 g topically 4 (four) times daily as needed. ) 100 g 5  . estradiol (ESTRACE VAGINAL) 0.1 MG/GM vaginal cream Place 1 Applicatorful vaginally 2 (two) times a week. 123XX123 g 5  . folic acid (FOLVITE) 1 MG tablet TAKE 1 TABLET BY MOUTH EVERY DAY 90 tablet 0  . glucosamine-chondroitin 500-400 MG tablet Take 1 tablet by mouth 2 (two) times daily.    Marland Kitchen ibuprofen (ADVIL,MOTRIN) 200 MG tablet Take 200 mg by mouth every 6 (six) hours as needed for moderate pain.    Marland Kitchen imatinib (GLEEVEC) 400 MG tablet Take 400 mg by mouth daily. Take with meals and large glass of water.Caution:Chemotherapy.    . Loperamide HCl (IMODIUM PO) Take by mouth daily as needed.     . Multiple Vitamin (MULTIVITAMIN) tablet Take 1 tablet by mouth daily.    . Probiotic Product (ALIGN) 4 MG CAPS Take 1 capsule by mouth daily.    Marland Kitchen imatinib (GLEEVEC) 100 MG tablet Take 3 tablets (300 mg total) by mouth  2 (two) times daily. Take with meals and large glass of water.Caution:Chemotherapy 180 tablet 9   No current facility-administered medications for this visit.     PHYSICAL EXAMINATION: ECOG PERFORMANCE STATUS: 1 - Symptomatic but completely ambulatory  Vitals:   06/27/16 0912  BP: 139/83  Pulse: 81  Resp: 18  Temp: 97.8 F (36.6 C)   Filed Weights   06/27/16 0912  Weight: 118 lb 6.4 oz (53.7 kg)    GENERAL:alert, no distress and comfortable SKIN: skin color, texture, turgor are normal, no rashes or significant lesions EYES: normal, Conjunctiva are pink and non-injected, sclera clear OROPHARYNX:no  exudate, no erythema and lips, buccal mucosa, and tongue normal  NECK: supple, thyroid normal size, non-tender, without nodularity LYMPH:  no palpable lymphadenopathy in the cervical, axillary or inguinal LUNGS: clear to auscultation and percussion with normal breathing effort HEART: regular rate & rhythm and no murmurs and no lower extremity edema ABDOMEN:abdomen soft, non-tender and normal bowel sounds Musculoskeletal:no cyanosis of digits and no clubbing  NEURO: alert & oriented x 3 with fluent speech, no focal motor/sensory deficits  LABORATORY DATA:  I have reviewed the data as listed    Component Value Date/Time   NA 140 06/19/2016 1324   K 3.9 06/19/2016 1324   CL 106 08/17/2015 0851   CL 105 09/04/2012 1006   CO2 27 06/19/2016 1324   GLUCOSE 114 06/19/2016 1324   GLUCOSE 96 09/04/2012 1006   BUN 26.5 (H) 06/19/2016 1324   CREATININE 1.0 06/19/2016 1324   CALCIUM 9.7 06/19/2016 1324   PROT 6.7 06/19/2016 1324   ALBUMIN 4.2 06/19/2016 1324   AST 36 (H) 06/19/2016 1324   ALT 23 06/19/2016 1324   ALKPHOS 65 06/19/2016 1324   BILITOT 0.63 06/19/2016 1324   GFRNONAA 79 (L) 06/12/2013 0452   GFRAA >90 06/12/2013 0452    No results found for: SPEP, UPEP  Lab Results  Component Value Date   WBC 4.9 06/19/2016   NEUTROABS 3.1 06/19/2016   HGB 11.2 (L) 06/19/2016   HCT 33.3 (L) 06/19/2016   MCV 107.4 (H) 06/19/2016   PLT 133 (L) 06/19/2016      Chemistry      Component Value Date/Time   NA 140 06/19/2016 1324   K 3.9 06/19/2016 1324   CL 106 08/17/2015 0851   CL 105 09/04/2012 1006   CO2 27 06/19/2016 1324   BUN 26.5 (H) 06/19/2016 1324   CREATININE 1.0 06/19/2016 1324      Component Value Date/Time   CALCIUM 9.7 06/19/2016 1324   ALKPHOS 65 06/19/2016 1324   AST 36 (H) 06/19/2016 1324   ALT 23 06/19/2016 1324   BILITOT 0.63 06/19/2016 1324       ASSESSMENT & PLAN:  Chronic myeloid leukemia Unfortunately, the patient had developed disease relapse. She  has resumed treatment for the last 6 weeks, the dose of Gleevec was increased to 400 mg daily for the last 2 weeks. She tolerated treatment well except for mild diarrhea. She has chronic anemia which is stable. We discussed the guidelines. Even though she has lost molecular remission, she is still in hematologic remission. We discussed the risk and benefit of increasing the dose of Gleevec to 600 mg daily versus switching her to second-generation TKI such as Sprycel. Ultimately, she agreed to increase the dose of Gleevec to 600 mg daily. I will bring her back every 2 weeks for blood count monitoring and plan to recheck BCR/ABL in 4 weeks. At that time,  she will be in [place] with family.  I will call her with test results. I plan to see her back again in the first week of May 2018.  She agree with the plan of care  Pancytopenia due to antineoplastic chemotherapy [facility]) This is likely due to recent treatment. The patient denies recent history of bleeding such as epistaxis, hematuria or hematochezia. She is asymptomatic from the anemia & thrombocytopenia. I will observe for now.  Diarrhea This is due to side effects of Gleevec.  It is manageable conservatively.  We will observe for now   Orders Placed This Encounter  Procedures  . BCR-ABL    With RT-PCR technique    Standing Status:   Future    Standing Expiration Date:   08/01/2017  . BCR-ABL    With RT-PCR technique    Standing Status:   Future    Standing Expiration Date:   08/01/2017   All questions were answered. The patient knows to call the clinic with any problems, questions or concerns. No barriers to learning was detected. I spent 20 minutes counseling the patient face to face. The total time spent in the appointment was 30 minutes and more than 50% was on counseling and review of test results     Heath Lark, MD 06/27/2016 4:05 PM

## 2016-06-27 NOTE — Assessment & Plan Note (Signed)
Unfortunately, the patient had developed disease relapse. She has resumed treatment for the last 6 weeks, the dose of Gleevec was increased to 400 mg daily for the last 2 weeks. She tolerated treatment well except for mild diarrhea. She has chronic anemia which is stable. We discussed the guidelines. Even though she has lost molecular remission, she is still in hematologic remission. We discussed the risk and benefit of increasing the dose of Gleevec to 600 mg daily versus switching her to second-generation TKI such as Sprycel. Ultimately, she agreed to increase the dose of Gleevec to 600 mg daily. I will bring her back every 2 weeks for blood count monitoring and plan to recheck BCR/ABL in 4 weeks. At that time, she will be in [place] with family.  I will call her with test results. I plan to see her back again in the first week of May 2018.  She agree with the plan of care

## 2016-06-27 NOTE — Assessment & Plan Note (Signed)
This is likely due to recent treatment. The patient denies recent history of bleeding such as epistaxis, hematuria or hematochezia. She is asymptomatic from the anemia & thrombocytopenia. I will observe for now. 

## 2016-06-27 NOTE — Telephone Encounter (Signed)
Gave patient avs report and appointments for March thru May. °

## 2016-06-27 NOTE — Assessment & Plan Note (Signed)
This is due to side effects of Gleevec.  It is manageable conservatively.  We will observe for now

## 2016-06-28 ENCOUNTER — Encounter: Payer: Self-pay | Admitting: Internal Medicine

## 2016-06-29 ENCOUNTER — Telehealth: Payer: Self-pay | Admitting: Pharmacist

## 2016-06-29 DIAGNOSIS — C9210 Chronic myeloid leukemia, BCR/ABL-positive, not having achieved remission: Secondary | ICD-10-CM

## 2016-06-29 LAB — IMATINIB RESISTANCE IN CML (GENPATH)

## 2016-06-29 MED ORDER — IMATINIB MESYLATE 100 MG PO TABS: 300.0000 mg | ORAL_TABLET | Freq: Two times a day (BID) | ORAL | 9 refills | Status: DC

## 2016-06-29 NOTE — Telephone Encounter (Signed)
Oral Chemotherapy Pharmacist Encounter  Received prescription for [place] for patient which is a dose increase for her. Noted patient was receiving Gleevec through [facility]. Per patient's insurance, [facility] is preferred specialty pharmacy.  Prescription will be e-scribed to [facility] in [place], [place] (ph: [phone]).  Oral Oncology Clinic will continue to follow.  Johny Drilling, PharmD, BCPS, BCOP 06/29/2016  3:37 PM Oral Oncology Clinic [phone]

## 2016-07-03 ENCOUNTER — Other Ambulatory Visit: Payer: Medicare Other

## 2016-07-03 ENCOUNTER — Ambulatory Visit: Payer: Medicare Other | Admitting: Hematology and Oncology

## 2016-07-06 MED ORDER — IMATINIB MESYLATE 100 MG PO TABS: 300.0000 mg | ORAL_TABLET | Freq: Two times a day (BID) | ORAL | 3 refills | Status: DC

## 2016-07-06 NOTE — Telephone Encounter (Signed)
Oral Chemotherapy Pharmacist Encounter  Received call from patient with request to send imatinib prescription to [facility] on [address] in [place], [place]. Patient states this will be more convenient for her. I will e-scribe 3 month supply of imatinib (Gleevec) at this time. Patient will call [facility] later today and alert Oral Oncology Clinic to any issues.  Oral Oncology Clinic will continue to follow.  Johny Drilling, PharmD, BCPS, BCOP 07/06/2016  10:52 AM Oral Oncology Clinic [phone]

## 2016-07-09 ENCOUNTER — Telehealth: Payer: Self-pay

## 2016-07-09 NOTE — Telephone Encounter (Addendum)
Oral Chemotherapy Pharmacist Encounter  Received call from patient this morning that she had spoken with [facility] and her prescription for imatinib 300mg  BID was being rejected by her insurance. I called [facility] at [phone] to inquire about prior authorizations on file. Last PA for imatinib no longer valid. No current PA;s on file per patient.  Prior authorization initiated over the phone for imatinib 100mg  tablets, take 3 tablets (300mg  total) by mouth two times daily. [id number] Quantity exception requested for dose titration purposes Status is pending pharmacist review, request marked as urgent (patient only has enough medication on hand for 3/12 and 3/13).  I was informed PA would not be needed for one 400mg  tab and 2 100mg  tabs daily. Patient and MD preference would be to fill as 100mg  tablets only (6 tabs/day for 180 tabs/month)  I called patient to update her on status. Patient expressed understanding and appreciation.  Oral Oncology Clinic will continue to follow.  Johny Drilling, PharmD, BCPS, BCOP 07/09/2016  3:31 PM Oral Oncology Clinic [phone]

## 2016-07-09 NOTE — Telephone Encounter (Signed)
Pt is having discussions with her insurance. Currently she has 9 100mg  tablets of gleevec remaining. She will run out before her next rx is completed. Do you want her to continue with the 300mg  BID or do you want her to spread them out over the intervening days by taking 300 daily.

## 2016-07-09 NOTE — Telephone Encounter (Signed)
Oral Chemotherapy Pharmacist Encounter  Received notification from [facility] that prior authorization for imatinib 300mg  BID (using 6 of the 100mg  tabs/day) has been approved through 04/29/17 [id number]  I called and updated patient. She will call [facility] to have them re-process prescription. Patient knows to reach out to the [facility] with any future access issues.  No further needs from [facility] identified at this time. Oral Oncology Clinic will sign off. Please let us know if we can be of assistance in the future.   Johny Drilling, PharmD, BCPS, BCOP 07/09/2016  3:55 PM Oral Oncology Clinic [phone]

## 2016-07-09 NOTE — Telephone Encounter (Signed)
Will you tell the nurse I talked with your Pharmacist after she and I talked.  The [place] prescription has been approved by my pharmacy.  I will get the refill from [facility]."

## 2016-07-09 NOTE — Telephone Encounter (Signed)
As previous conversation was in progress, Evelena Peat got approval for [place] and pt will be getting the rx filled tomorrow.

## 2016-07-11 ENCOUNTER — Other Ambulatory Visit (HOSPITAL_BASED_OUTPATIENT_CLINIC_OR_DEPARTMENT_OTHER): Payer: Medicare Other

## 2016-07-11 DIAGNOSIS — C9210 Chronic myeloid leukemia, BCR/ABL-positive, not having achieved remission: Secondary | ICD-10-CM

## 2016-07-11 LAB — CBC WITH DIFFERENTIAL/PLATELET
BASO%: 0.2 % (ref 0.0–2.0)
Basophils Absolute: 0 10*3/uL (ref 0.0–0.1)
EOS%: 2.2 % (ref 0.0–7.0)
Eosinophils Absolute: 0.1 10*3/uL (ref 0.0–0.5)
HEMATOCRIT: 34.6 % — AB (ref 34.8–46.6)
HGB: 11.8 g/dL (ref 11.6–15.9)
LYMPH#: 2.1 10*3/uL (ref 0.9–3.3)
LYMPH%: 42.6 % (ref 14.0–49.7)
MCH: 35.8 pg — ABNORMAL HIGH (ref 25.1–34.0)
MCHC: 34.1 g/dL (ref 31.5–36.0)
MCV: 104.8 fL — ABNORMAL HIGH (ref 79.5–101.0)
MONO#: 0.5 10*3/uL (ref 0.1–0.9)
MONO%: 11 % (ref 0.0–14.0)
NEUT#: 2.2 10*3/uL (ref 1.5–6.5)
NEUT%: 44 % (ref 38.4–76.8)
Platelets: 127 10*3/uL — ABNORMAL LOW (ref 145–400)
RBC: 3.3 10*6/uL — AB (ref 3.70–5.45)
RDW: 15.3 % — ABNORMAL HIGH (ref 11.2–14.5)
WBC: 4.9 10*3/uL (ref 3.9–10.3)

## 2016-07-11 LAB — COMPREHENSIVE METABOLIC PANEL
ALBUMIN: 4.1 g/dL (ref 3.5–5.0)
ALK PHOS: 69 U/L (ref 40–150)
ALT: 24 U/L (ref 0–55)
ANION GAP: 10 meq/L (ref 3–11)
AST: 36 U/L — ABNORMAL HIGH (ref 5–34)
BILIRUBIN TOTAL: 0.5 mg/dL (ref 0.20–1.20)
BUN: 21.4 mg/dL (ref 7.0–26.0)
CO2: 27 meq/L (ref 22–29)
Calcium: 9.2 mg/dL (ref 8.4–10.4)
Chloride: 104 mEq/L (ref 98–109)
Creatinine: 1 mg/dL (ref 0.6–1.1)
EGFR: 53 mL/min/{1.73_m2} — AB (ref >90)
Glucose: 95 mg/dl (ref 70–140)
Potassium: 4.3 mEq/L (ref 3.5–5.1)
Sodium: 141 mEq/L (ref 136–145)
Total Protein: 6.4 g/dL (ref 6.4–8.3)

## 2016-07-25 ENCOUNTER — Other Ambulatory Visit (HOSPITAL_BASED_OUTPATIENT_CLINIC_OR_DEPARTMENT_OTHER): Payer: Medicare Other

## 2016-07-25 DIAGNOSIS — C9210 Chronic myeloid leukemia, BCR/ABL-positive, not having achieved remission: Secondary | ICD-10-CM | POA: Diagnosis not present

## 2016-07-25 LAB — CBC WITH DIFFERENTIAL/PLATELET
BASO%: 0.4 % (ref 0.0–2.0)
Basophils Absolute: 0 10*3/uL (ref 0.0–0.1)
EOS%: 2.3 % (ref 0.0–7.0)
Eosinophils Absolute: 0.1 10*3/uL (ref 0.0–0.5)
HCT: 29.6 % — ABNORMAL LOW (ref 34.8–46.6)
HGB: 10.1 g/dL — ABNORMAL LOW (ref 11.6–15.9)
LYMPH%: 26.4 % (ref 14.0–49.7)
MCH: 36.9 pg — AB (ref 25.1–34.0)
MCHC: 34.1 g/dL (ref 31.5–36.0)
MCV: 108.3 fL — ABNORMAL HIGH (ref 79.5–101.0)
MONO#: 0.5 10*3/uL (ref 0.1–0.9)
MONO%: 12.2 % (ref 0.0–14.0)
NEUT#: 2.2 10*3/uL (ref 1.5–6.5)
NEUT%: 58.7 % (ref 38.4–76.8)
PLATELETS: 132 10*3/uL — AB (ref 145–400)
RBC: 2.74 10*6/uL — AB (ref 3.70–5.45)
RDW: 16.3 % — ABNORMAL HIGH (ref 11.2–14.5)
WBC: 3.7 10*3/uL — ABNORMAL LOW (ref 3.9–10.3)
lymph#: 1 10*3/uL (ref 0.9–3.3)

## 2016-07-25 LAB — COMPREHENSIVE METABOLIC PANEL
ALT: 23 U/L (ref 0–55)
ANION GAP: 7 meq/L (ref 3–11)
AST: 35 U/L — ABNORMAL HIGH (ref 5–34)
Albumin: 3.7 g/dL (ref 3.5–5.0)
Alkaline Phosphatase: 54 U/L (ref 40–150)
BUN: 20.9 mg/dL (ref 7.0–26.0)
CO2: 27 meq/L (ref 22–29)
Calcium: 9 mg/dL (ref 8.4–10.4)
Chloride: 104 mEq/L (ref 98–109)
Creatinine: 0.9 mg/dL (ref 0.6–1.1)
EGFR: 61 mL/min/{1.73_m2} — ABNORMAL LOW (ref >90)
Glucose: 95 mg/dl (ref 70–140)
Potassium: 4.4 mEq/L (ref 3.5–5.1)
Sodium: 139 mEq/L (ref 136–145)
Total Bilirubin: 0.68 mg/dL (ref 0.20–1.20)
Total Protein: 5.9 g/dL — ABNORMAL LOW (ref 6.4–8.3)

## 2016-08-01 ENCOUNTER — Telehealth: Payer: Self-pay | Admitting: Hematology and Oncology

## 2016-08-01 NOTE — Telephone Encounter (Signed)
I reviewed blood test result with the patient over the telephone. She has achieved MMR She has no major side effects from [place] I have cancelled her lab appointment on 4/11 but plan to keep the one on 4/25 She expressed understanding

## 2016-08-02 ENCOUNTER — Other Ambulatory Visit: Payer: Self-pay | Admitting: Internal Medicine

## 2016-08-08 ENCOUNTER — Other Ambulatory Visit: Payer: Medicare Other

## 2016-08-22 ENCOUNTER — Other Ambulatory Visit (HOSPITAL_BASED_OUTPATIENT_CLINIC_OR_DEPARTMENT_OTHER): Payer: Medicare Other

## 2016-08-22 DIAGNOSIS — C9210 Chronic myeloid leukemia, BCR/ABL-positive, not having achieved remission: Secondary | ICD-10-CM

## 2016-08-22 LAB — COMPREHENSIVE METABOLIC PANEL
ALT: 20 U/L (ref 0–55)
ANION GAP: 8 meq/L (ref 3–11)
AST: 35 U/L — ABNORMAL HIGH (ref 5–34)
Albumin: 3.6 g/dL (ref 3.5–5.0)
Alkaline Phosphatase: 46 U/L (ref 40–150)
BILIRUBIN TOTAL: 0.63 mg/dL (ref 0.20–1.20)
BUN: 20.3 mg/dL (ref 7.0–26.0)
CALCIUM: 8.6 mg/dL (ref 8.4–10.4)
CO2: 27 mEq/L (ref 22–29)
CREATININE: 1 mg/dL (ref 0.6–1.1)
Chloride: 105 mEq/L (ref 98–109)
EGFR: 54 mL/min/{1.73_m2} — ABNORMAL LOW (ref >90)
Glucose: 96 mg/dl (ref 70–140)
Potassium: 4.6 mEq/L (ref 3.5–5.1)
Sodium: 140 mEq/L (ref 136–145)
TOTAL PROTEIN: 5.6 g/dL — AB (ref 6.4–8.3)

## 2016-08-22 LAB — CBC WITH DIFFERENTIAL/PLATELET
BASO%: 0.3 % (ref 0.0–2.0)
Basophils Absolute: 0 10*3/uL (ref 0.0–0.1)
EOS ABS: 0.2 10*3/uL (ref 0.0–0.5)
EOS%: 7.5 % — ABNORMAL HIGH (ref 0.0–7.0)
HEMATOCRIT: 27.2 % — AB (ref 34.8–46.6)
HGB: 9.1 g/dL — ABNORMAL LOW (ref 11.6–15.9)
LYMPH#: 0.9 10*3/uL (ref 0.9–3.3)
LYMPH%: 29.8 % (ref 14.0–49.7)
MCH: 37.4 pg — ABNORMAL HIGH (ref 25.1–34.0)
MCHC: 33.5 g/dL (ref 31.5–36.0)
MCV: 111.9 fL — ABNORMAL HIGH (ref 79.5–101.0)
MONO#: 0.4 10*3/uL (ref 0.1–0.9)
MONO%: 11.5 % (ref 0.0–14.0)
NEUT%: 50.9 % (ref 38.4–76.8)
NEUTROS ABS: 1.6 10*3/uL (ref 1.5–6.5)
PLATELETS: 126 10*3/uL — AB (ref 145–400)
RBC: 2.43 10*6/uL — ABNORMAL LOW (ref 3.70–5.45)
RDW: 18.6 % — ABNORMAL HIGH (ref 11.2–14.5)
WBC: 3.1 10*3/uL — ABNORMAL LOW (ref 3.9–10.3)

## 2016-08-30 ENCOUNTER — Ambulatory Visit (HOSPITAL_BASED_OUTPATIENT_CLINIC_OR_DEPARTMENT_OTHER): Payer: Medicare Other | Admitting: Hematology and Oncology

## 2016-08-30 ENCOUNTER — Telehealth: Payer: Self-pay | Admitting: Hematology and Oncology

## 2016-08-30 ENCOUNTER — Encounter: Payer: Self-pay | Admitting: Hematology and Oncology

## 2016-08-30 DIAGNOSIS — D61810 Antineoplastic chemotherapy induced pancytopenia: Secondary | ICD-10-CM

## 2016-08-30 DIAGNOSIS — R197 Diarrhea, unspecified: Secondary | ICD-10-CM

## 2016-08-30 DIAGNOSIS — T451X5A Adverse effect of antineoplastic and immunosuppressive drugs, initial encounter: Secondary | ICD-10-CM

## 2016-08-30 DIAGNOSIS — C9210 Chronic myeloid leukemia, BCR/ABL-positive, not having achieved remission: Secondary | ICD-10-CM

## 2016-08-30 NOTE — Telephone Encounter (Signed)
Gave patient AVS and calender per 5/3 los. Scheduled labs on 5/30 &11/05 and f/u on 11/12.

## 2016-08-31 ENCOUNTER — Encounter: Payer: Self-pay | Admitting: Hematology and Oncology

## 2016-08-31 NOTE — Progress Notes (Signed)
[facility] OFFICE PROGRESS NOTE  Patient Care Team: Binnie Rail, MD as PCP - General (Internal Medicine)  SUMMARY OF ONCOLOGIC HISTORY:   Chronic myeloid leukemia ([place])   02/18/2007 Initial Diagnosis    Chronic myeloid leukemia      08/20/2014 Tumor Marker    BCR/ABL is undetectable      03/04/2015 Pathology Results    BCR/ABL is undetectable      08/29/2015 Tumor Marker    BCR/ABL is undetectable      02/29/2016 Pathology Results    BCR/ABL is undetectable      05/01/2016 Pathology Results    BCR/ABL b2a2 is detected at 0.03% and IS ratio 0.069%      05/31/2016 Pathology Results    BCR/ABL b2a2 is detected at 0.12% and IS ratio 0.276%      06/19/2016 Pathology Results    BCR/ABL b2a2 is detected at 0.24% and IS ratio 0.552%      06/19/2016 Miscellaneous    ABL mutation kinase testing is negative      06/27/2016 Miscellaneous    Dose of Gleevec is increased to 600 mg daily      07/25/2016 Pathology Results    BCR/ABL b2a2 is detected at 0.03% and IS ratio 0.069%. She has achieved MMR       INTERVAL HISTORY: Please see below for problem oriented charting. She returns with her husband to review test results With higher dose Gleevec, she complain of weight gain and diarrhea She denies recent infection The patient denies any recent signs or symptoms of bleeding such as spontaneous epistaxis, hematuria or hematochezia.   REVIEW OF SYSTEMS:   Constitutional: Denies fevers, chills or abnormal weight loss Eyes: Denies blurriness of vision Ears, nose, mouth, throat, and face: Denies mucositis or sore throat Respiratory: Denies cough, dyspnea or wheezes Cardiovascular: Denies palpitation, chest discomfort or lower extremity swelling Skin: Denies abnormal skin rashes Lymphatics: Denies new lymphadenopathy or easy bruising Neurological:Denies numbness, tingling or new weaknesses Behavioral/Psych: Mood is stable, no new changes  All other systems were  reviewed with the patient and are negative.  I have reviewed the past medical history, past surgical history, social history and family history with the patient and they are unchanged from previous note.  ALLERGIES:  is allergic to penicillins.  MEDICATIONS:  Current Outpatient Prescriptions  Medication Sig Dispense Refill  . aspirin 81 MG tablet Take 81 mg by mouth daily.    . Biotin 1 MG CAPS Take by mouth daily.    . diclofenac sodium (VOLTAREN) 1 % GEL Apply 2 g topically 4 (four) times daily. (Patient taking differently: Apply 2 g topically 4 (four) times daily as needed. ) 100 g 5  . estradiol (ESTRACE VAGINAL) 0.1 MG/GM vaginal cream Place 1 Applicatorful vaginally 2 (two) times a week. 42.5 g 5  . FINACEA 15 % cream Apply 1 application topically daily.  2  . folic acid (FOLVITE) 1 MG tablet Take 1 tablet (1 mg total) by mouth daily. --- Office visit needed for further refills 90 tablet 0  . glucosamine-chondroitin 500-400 MG tablet Take 1 tablet by mouth 2 (two) times daily.    Marland Kitchen ibuprofen (ADVIL,MOTRIN) 200 MG tablet Take 200 mg by mouth every 6 (six) hours as needed for moderate pain.    Marland Kitchen imatinib (GLEEVEC) 100 MG tablet Take 3 tablets (300 mg total) by mouth 2 (two) times daily. Take with meals and large glass of water.Caution:Chemotherapy 540 tablet 3  . Loperamide HCl (  IMODIUM PO) Take by mouth daily as needed.     . Multiple Vitamin (MULTIVITAMIN) tablet Take 1 tablet by mouth daily.    . Probiotic Product (ALIGN) 4 MG CAPS Take 1 capsule by mouth daily.     No current facility-administered medications for this visit.     PHYSICAL EXAMINATION: ECOG PERFORMANCE STATUS: 1 - Symptomatic but completely ambulatory  Vitals:   08/30/16 0930  BP: 126/64  Pulse: 80  Resp: 18  Temp: 98.7 F (37.1 C)   Filed Weights   08/30/16 0930  Weight: 124 lb 14.4 oz (56.7 kg)    GENERAL:alert, no distress and comfortable SKIN: skin color, texture, turgor are normal, no rashes or  significant lesions EYES: normal, Conjunctiva are pink and non-injected, sclera clear OROPHARYNX:no exudate, no erythema and lips, buccal mucosa, and tongue normal  NECK: supple, thyroid normal size, non-tender, without nodularity LYMPH:  no palpable lymphadenopathy in the cervical, axillary or inguinal LUNGS: clear to auscultation and percussion with normal breathing effort HEART: regular rate & rhythm and no murmurs and no lower extremity edema ABDOMEN:abdomen soft, non-tender and normal bowel sounds Musculoskeletal:no cyanosis of digits and no clubbing  NEURO: alert & oriented x 3 with fluent speech, no focal motor/sensory deficits  LABORATORY DATA:  I have reviewed the data as listed    Component Value Date/Time   NA 140 08/22/2016 0807   K 4.6 08/22/2016 0807   CL 106 08/17/2015 0851   CL 105 09/04/2012 1006   CO2 27 08/22/2016 0807   GLUCOSE 96 08/22/2016 0807   GLUCOSE 96 09/04/2012 1006   BUN 20.3 08/22/2016 0807   CREATININE 1.0 08/22/2016 0807   CALCIUM 8.6 08/22/2016 0807   PROT 5.6 (L) 08/22/2016 0807   ALBUMIN 3.6 08/22/2016 0807   AST 35 (H) 08/22/2016 0807   ALT 20 08/22/2016 0807   ALKPHOS 46 08/22/2016 0807   BILITOT 0.63 08/22/2016 0807   GFRNONAA 79 (L) 06/12/2013 0452   GFRAA >90 06/12/2013 0452    No results found for: SPEP, UPEP  Lab Results  Component Value Date   WBC 3.1 (L) 08/22/2016   NEUTROABS 1.6 08/22/2016   HGB 9.1 (L) 08/22/2016   HCT 27.2 (L) 08/22/2016   MCV 111.9 (H) 08/22/2016   PLT 126 (L) 08/22/2016      Chemistry      Component Value Date/Time   NA 140 08/22/2016 0807   K 4.6 08/22/2016 0807   CL 106 08/17/2015 0851   CL 105 09/04/2012 1006   CO2 27 08/22/2016 0807   BUN 20.3 08/22/2016 0807   CREATININE 1.0 08/22/2016 0807      Component Value Date/Time   CALCIUM 8.6 08/22/2016 0807   ALKPHOS 46 08/22/2016 0807   AST 35 (H) 08/22/2016 0807   ALT 20 08/22/2016 0807   BILITOT 0.63 08/22/2016 0807        ASSESSMENT & PLAN:  Chronic myeloid leukemia With higher dose Gleevec, she has achieved major molecular response. I recommend she reduces the treatment back to 300 mg daily now that she has achieved major molecular response, especially she had recent diarrhea and weight gain with high dose Gleevec I will try to coordinate blood work monitoring with her Ideally, I would like her to get blood work done every month for the next 3 months The patient will be leaving to go to [place] soon I will schedule appointment before she leaves and when she returns I gave her a letter to take to her  local doctor in [place] for blood work monitoring there  Pancytopenia due to antineoplastic chemotherapy [facility]) This is likely due to recent treatment. The patient denies recent history of bleeding such as epistaxis, hematuria or hematochezia. She is asymptomatic from the anemia & thrombocytopenia. I will observe for now.  Diarrhea This is due to side effects of Gleevec.  It is manageable conservatively.  We will observe for now Plan to reduce the dose as above   No orders of the defined types were placed in this encounter.  All questions were answered. The patient knows to call the clinic with any problems, questions or concerns. No barriers to learning was detected. I spent 15 minutes counseling the patient face to face. The total time spent in the appointment was 20 minutes and more than 50% was on counseling and review of test results     Heath Lark, MD 08/31/2016 5:24 PM

## 2016-08-31 NOTE — Assessment & Plan Note (Signed)
This is due to side effects of Gleevec.  It is manageable conservatively.  We will observe for now Plan to reduce the dose as above

## 2016-08-31 NOTE — Assessment & Plan Note (Signed)
This is likely due to recent treatment. The patient denies recent history of bleeding such as epistaxis, hematuria or hematochezia. She is asymptomatic from the anemia & thrombocytopenia. I will observe for now. 

## 2016-08-31 NOTE — Assessment & Plan Note (Signed)
With higher dose Gleevec, she has achieved major molecular response. I recommend she reduces the treatment back to 300 mg daily now that she has achieved major molecular response, especially she had recent diarrhea and weight gain with high dose Gleevec I will try to coordinate blood work monitoring with her Ideally, I would like her to get blood work done every month for the next 3 months The patient will be leaving to go to [place] soon I will schedule appointment before she leaves and when she returns I gave her a letter to take to her local doctor in [place] for blood work monitoring there

## 2016-09-04 ENCOUNTER — Other Ambulatory Visit: Payer: Self-pay | Admitting: Hematology and Oncology

## 2016-09-04 ENCOUNTER — Telehealth: Payer: Self-pay

## 2016-09-04 DIAGNOSIS — C9210 Chronic myeloid leukemia, BCR/ABL-positive, not having achieved remission: Secondary | ICD-10-CM

## 2016-09-04 DIAGNOSIS — Z853 Personal history of malignant neoplasm of breast: Secondary | ICD-10-CM

## 2016-09-04 NOTE — Telephone Encounter (Signed)
-----   Message from Loletta Parish sent at 09/04/2016  3:29 PM EDT ----- Regarding: RE: PET scan Referral has been authorized.  Thanks  ----- Message ----- From: Heath Lark, MD Sent: 09/04/2016   2:27 PM To: Patton Salles, RN, Flo Shanks, RN, # Subject: PET scan                                       Please precert and get it done by next week Thanks

## 2016-09-04 NOTE — Telephone Encounter (Signed)
Called patient regarding MRI that she had recently with Dr. Wynelle Link. She dropped a copy of the MRI report and wanted Dr. Alvy Bimler to have a copy.   Patient states that Dr. Synthia Innocent does not want to order a MRI or PET scan. She would like for Dr. Alvy Bimler to order what ever scan she thinks is appropriate.

## 2016-09-04 NOTE — Telephone Encounter (Signed)
Called and talked with scheduler, they will call and schedule.

## 2016-09-04 NOTE — Telephone Encounter (Signed)
I will order PET I will send scheduling msg to see her back in 2 weeks to review test

## 2016-09-06 ENCOUNTER — Telehealth: Payer: Self-pay | Admitting: Hematology and Oncology

## 2016-09-06 NOTE — Telephone Encounter (Signed)
Called patient to inform her of next scheduled appointment for 09/18/16. LVM

## 2016-09-17 ENCOUNTER — Ambulatory Visit (HOSPITAL_COMMUNITY)
Admission: RE | Admit: 2016-09-17 | Discharge: 2016-09-17 | Disposition: A | Discharge status: Home / Self Care | Payer: Medicare Other | Source: Ambulatory Visit | Attending: Hematology and Oncology | Admitting: Hematology and Oncology

## 2016-09-17 DIAGNOSIS — R918 Other nonspecific abnormal finding of lung field: Secondary | ICD-10-CM | POA: Diagnosis not present

## 2016-09-17 DIAGNOSIS — I2510 Atherosclerotic heart disease of native coronary artery without angina pectoris: Secondary | ICD-10-CM | POA: Diagnosis not present

## 2016-09-17 DIAGNOSIS — Z853 Personal history of malignant neoplasm of breast: Secondary | ICD-10-CM | POA: Diagnosis not present

## 2016-09-17 DIAGNOSIS — I700 Atherosclerosis of aorta: Secondary | ICD-10-CM | POA: Diagnosis not present

## 2016-09-17 DIAGNOSIS — C9210 Chronic myeloid leukemia, BCR/ABL-positive, not having achieved remission: Secondary | ICD-10-CM | POA: Insufficient documentation

## 2016-09-17 LAB — GLUCOSE, CAPILLARY: Glucose-Capillary: 91 mg/dL (ref 65–99)

## 2016-09-17 MED ORDER — FLUDEOXYGLUCOSE F - 18 (FDG) INJECTION: 6.5000 | Freq: Once | INTRAVENOUS | Status: DC | PRN

## 2016-09-18 ENCOUNTER — Telehealth: Payer: Self-pay | Admitting: Hematology and Oncology

## 2016-09-18 ENCOUNTER — Encounter: Payer: Self-pay | Admitting: Hematology and Oncology

## 2016-09-18 ENCOUNTER — Ambulatory Visit (HOSPITAL_BASED_OUTPATIENT_CLINIC_OR_DEPARTMENT_OTHER): Payer: Medicare Other | Admitting: Hematology and Oncology

## 2016-09-18 DIAGNOSIS — R918 Other nonspecific abnormal finding of lung field: Secondary | ICD-10-CM

## 2016-09-18 DIAGNOSIS — M899 Disorder of bone, unspecified: Secondary | ICD-10-CM | POA: Diagnosis not present

## 2016-09-18 DIAGNOSIS — C9210 Chronic myeloid leukemia, BCR/ABL-positive, not having achieved remission: Secondary | ICD-10-CM

## 2016-09-18 DIAGNOSIS — Z853 Personal history of malignant neoplasm of breast: Secondary | ICD-10-CM | POA: Diagnosis not present

## 2016-09-18 NOTE — Assessment & Plan Note (Signed)
With higher dose Gleevec, she has achieved major molecular response. I recommend she reduces the treatment back to 300 mg daily now that she has achieved major molecular response, especially she had recent diarrhea and weight gain with high dose Gleevec I will try to coordinate blood work monitoring with her Ideally, I would like her to get blood work done every month for the next 3 months Since she  anticipates orthopedic surgery in June, she will return here next week for blood work and again in June for blood draws

## 2016-09-18 NOTE — Progress Notes (Signed)
[place] OFFICE PROGRESS NOTE  Patient Care Team: Binnie Rail, MD as PCP - General (Internal Medicine)  SUMMARY OF ONCOLOGIC HISTORY:   Chronic myeloid leukemia ([facility])   02/18/2007 Initial Diagnosis    Chronic myeloid leukemia      08/20/2014 Tumor Marker    BCR/ABL is undetectable      03/04/2015 Pathology Results    BCR/ABL is undetectable      08/29/2015 Tumor Marker    BCR/ABL is undetectable      02/29/2016 Pathology Results    BCR/ABL is undetectable      05/01/2016 Pathology Results    BCR/ABL b2a2 is detected at 0.03% and IS ratio 0.069%      05/31/2016 Pathology Results    BCR/ABL b2a2 is detected at 0.12% and IS ratio 0.276%      06/19/2016 Pathology Results    BCR/ABL b2a2 is detected at 0.24% and IS ratio 0.552%      06/19/2016 Miscellaneous    ABL mutation kinase testing is negative      06/27/2016 Miscellaneous    Dose of Gleevec is increased to 600 mg daily      07/25/2016 Pathology Results    BCR/ABL b2a2 is detected at 0.03% and IS ratio 0.069%. She has achieved MMR      08/30/2016 Miscellaneous    Dose of Gleevec is reduced to 300 mg daily       INTERVAL HISTORY: Please see below for problem oriented charting. She returns with her husband to review test results She continues to have hip pain with planned surgery next month She has no other new complaints.  REVIEW OF SYSTEMS:   Constitutional: Denies fevers, chills or abnormal weight loss Eyes: Denies blurriness of vision Ears, nose, mouth, throat, and face: Denies mucositis or sore throat Respiratory: Denies cough, dyspnea or wheezes Cardiovascular: Denies palpitation, chest discomfort or lower extremity swelling Gastrointestinal:  Denies nausea, heartburn or change in bowel habits Skin: Denies abnormal skin rashes Lymphatics: Denies new lymphadenopathy or easy bruising Neurological:Denies numbness, tingling or new weaknesses Behavioral/Psych: Mood is stable, no new  changes  All other systems were reviewed with the patient and are negative.  I have reviewed the past medical history, past surgical history, social history and family history with the patient and they are unchanged from previous note.  ALLERGIES:  is allergic to penicillins.  MEDICATIONS:  Current Outpatient Prescriptions  Medication Sig Dispense Refill  . aspirin 81 MG tablet Take 81 mg by mouth daily.    . Biotin 1 MG CAPS Take by mouth daily.    . diclofenac sodium (VOLTAREN) 1 % GEL Apply 2 g topically 4 (four) times daily. (Patient taking differently: Apply 2 g topically 4 (four) times daily as needed. ) 100 g 5  . estradiol (ESTRACE VAGINAL) 0.1 MG/GM vaginal cream Place 1 Applicatorful vaginally 2 (two) times a week. 42.5 g 5  . FINACEA 15 % cream Apply 1 application topically daily.  2  . folic acid (FOLVITE) 1 MG tablet Take 1 tablet (1 mg total) by mouth daily. --- Office visit needed for further refills 90 tablet 0  . glucosamine-chondroitin 500-400 MG tablet Take 1 tablet by mouth 2 (two) times daily.    Marland Kitchen ibuprofen (ADVIL,MOTRIN) 200 MG tablet Take 200 mg by mouth every 6 (six) hours as needed for moderate pain.    Marland Kitchen imatinib (GLEEVEC) 100 MG tablet Take 3 tablets (300 mg total) by mouth 2 (two) times daily. Take with  meals and large glass of water.Caution:Chemotherapy 540 tablet 3  . Loperamide HCl (IMODIUM PO) Take by mouth daily as needed.     . Multiple Vitamin (MULTIVITAMIN) tablet Take 1 tablet by mouth daily.    . Probiotic Product (ALIGN) 4 MG CAPS Take 1 capsule by mouth daily.     No current facility-administered medications for this visit.    Facility-Administered Medications Ordered in Other Visits  Medication Dose Route Frequency Provider Last Rate Last Dose  . fludeoxyglucose F - 18 (FDG) injection 6.5 millicurie  6.5 millicurie Intravenous Once PRN Gaspar Cola, MD        PHYSICAL EXAMINATION: ECOG PERFORMANCE STATUS: 0 - Asymptomatic  Vitals:   09/18/16  1404  BP: (!) 142/65  Pulse: 96  Resp: 18  Temp: 98.9 F (37.2 C)   Filed Weights   09/18/16 1404  Weight: 122 lb 14.4 oz (55.7 kg)    GENERAL:alert, no distress and comfortable SKIN: skin color, texture, turgor are normal, no rashes or significant lesions EYES: normal, Conjunctiva are pink and non-injected, sclera clear Musculoskeletal:no cyanosis of digits and no clubbing  NEURO: alert & oriented x 3 with fluent speech, no focal motor/sensory deficits  LABORATORY DATA:  I have reviewed the data as listed    Component Value Date/Time   NA 140 08/22/2016 0807   K 4.6 08/22/2016 0807   CL 106 08/17/2015 0851   CL 105 09/04/2012 1006   CO2 27 08/22/2016 0807   GLUCOSE 96 08/22/2016 0807   GLUCOSE 96 09/04/2012 1006   BUN 20.3 08/22/2016 0807   CREATININE 1.0 08/22/2016 0807   CALCIUM 8.6 08/22/2016 0807   PROT 5.6 (L) 08/22/2016 0807   ALBUMIN 3.6 08/22/2016 0807   AST 35 (H) 08/22/2016 0807   ALT 20 08/22/2016 0807   ALKPHOS 46 08/22/2016 0807   BILITOT 0.63 08/22/2016 0807   GFRNONAA 79 (L) 06/12/2013 0452   GFRAA >90 06/12/2013 0452    No results found for: SPEP, UPEP  Lab Results  Component Value Date   WBC 3.1 (L) 08/22/2016   NEUTROABS 1.6 08/22/2016   HGB 9.1 (L) 08/22/2016   HCT 27.2 (L) 08/22/2016   MCV 111.9 (H) 08/22/2016   PLT 126 (L) 08/22/2016      Chemistry      Component Value Date/Time   NA 140 08/22/2016 0807   K 4.6 08/22/2016 0807   CL 106 08/17/2015 0851   CL 105 09/04/2012 1006   CO2 27 08/22/2016 0807   BUN 20.3 08/22/2016 0807   CREATININE 1.0 08/22/2016 0807      Component Value Date/Time   CALCIUM 8.6 08/22/2016 0807   ALKPHOS 46 08/22/2016 0807   AST 35 (H) 08/22/2016 0807   ALT 20 08/22/2016 0807   BILITOT 0.63 08/22/2016 0807       RADIOGRAPHIC STUDIES:I reviewed imaging study with the patient and husband I have personally reviewed the radiological images as listed and agreed with the findings in the report. Nm  Pet Image Initial (pi) Skull Base To Thigh  Addendum Date: 09/17/2016   ADDENDUM REPORT: 09/17/2016 09:29 ADDENDUM: The ordering provider Dr. Alvy Bimler called at 9:15 a.m. to provide additional information that the patient had an abnormal outside pelvic MRI demonstrating focal abnormal signal intensity in one of the hips. The PET-CT was ordered to address this abnormality. Please note that the outside MRI images and outside MRI report are not available to the interpreting radiologist for review at the time of this addendum. The  original report is unchanged. There is no hypermetabolic evidence of malignancy. Specifically, no focal hypermetabolic activity in the hips or pelvic osseous structures to suggest skeletal metastasis. Electronically Signed   By: Ilona Sorrel M.D.   On: 09/17/2016 09:29   Result Date: 09/17/2016 CLINICAL DATA:  Initial treatment strategy for chronic myeloid leukemia. Remote history of breast cancers status post conservation therapy. New hip pain. EXAM: NUCLEAR MEDICINE PET SKULL BASE TO THIGH TECHNIQUE: 6.5 mCi F-18 FDG was injected intravenously. Full-ring PET imaging was performed from the skull base to thigh after the radiotracer. CT data was obtained and used for attenuation correction and anatomic localization. FASTING BLOOD GLUCOSE:  Value: 91 mg/dl COMPARISON:  03/19/2003 CT neck, chest, abdomen and pelvis. FINDINGS: NECK No hypermetabolic lymph nodes in the neck. CHEST Right coronary atherosclerosis. Atherosclerotic nonaneurysmal thoracic aorta. Right axillary surgical clips are noted. No enlarged or hypermetabolic axillary, mediastinal or hilar lymph nodes. No pneumothorax or pleural effusion. Stable scattered subcentimeter calcified granulomas in the medial basilar left lower lobe. No acute consolidative airspace disease or lung masses. A few scattered subsolid pulmonary nodules in the right upper and middle lobes, largest 1.1 cm in the right middle lobe (series 8/image 46),  without significant metabolism. ABDOMEN/PELVIS No abnormal hypermetabolic activity within the liver, pancreas, adrenal glands, or spleen. No hypermetabolic lymph nodes in the abdomen or pelvis. Apparent hysterectomy. Atherosclerotic nonaneurysmal abdominal aorta. Surgical sutures are noted throughout the ventral abdominal wall. SKELETON No focal hypermetabolic activity to suggest skeletal metastasis. IMPRESSION: 1. No hypermetabolic evidence of malignancy. 2. Scattered subsolid pulmonary nodules in the right lung, largest 1.1 cm in the right middle lobe, favor inflammatory. Non-contrast chest CT at 3-6 months is recommended. If nodules persist, subsequent management will be based upon the most suspicious nodule(s). This recommendation follows the consensus statement: Guidelines for Management of Incidental Pulmonary Nodules Detected on CT Images:From the Fleischner Society 2017; published online before print (10.1148/radiol.7544920100). 3. Aortic atherosclerosis.  Coronary atherosclerosis. Electronically Signed: By: Ilona Sorrel M.D. On: 09/17/2016 08:48    ASSESSMENT & PLAN:  Chronic myeloid leukemia With higher dose Gleevec, she has achieved major molecular response. I recommend she reduces the treatment back to 300 mg daily now that she has achieved major molecular response, especially she had recent diarrhea and weight gain with high dose Gleevec I will try to coordinate blood work monitoring with her Ideally, I would like her to get blood work done every month for the next 3 months Since she  anticipates orthopedic surgery in June, she will return here next week for blood work and again in June for blood draws  History of breast cancer Her recent mammogram was negative for recurrence. PET CT scan showed no evidence of disease  Lung nodule, multiple She has remote smoking history She is not symptomatic from lung nodules Per radiologist's recommendation, I will plan to repeat CT scan in 6  months  Bone lesion According to outside MRI in April, she had abnormal bone lesion in the left hip PET CT scan is negative The patient is reassured   Orders Placed This Encounter  Procedures  . CT CHEST WO CONTRAST    Standing Status:   Future    Standing Expiration Date:   10/23/2017    Scheduling Instructions:     Please call this number for scheduling purpose: [phone]    Order Specific Question:   Reason for exam:    Answer:   lung nodule, follow-up CT    Order Specific  Question:   Preferred imaging location?    Answer:   [facility]   All questions were answered. The patient knows to call the clinic with any problems, questions or concerns. No barriers to learning was detected. I spent 15 minutes counseling the patient face to face. The total time spent in the appointment was 20 minutes and more than 50% was on counseling and review of test results     Heath Lark, MD 09/18/2016 4:32 PM

## 2016-09-18 NOTE — Telephone Encounter (Signed)
Gave patient AVS and calender per 5/22 los. Lab only 6/25

## 2016-09-18 NOTE — Assessment & Plan Note (Signed)
Her recent mammogram was negative for recurrence. PET CT scan showed no evidence of disease

## 2016-09-18 NOTE — Assessment & Plan Note (Signed)
She has remote smoking history She is not symptomatic from lung nodules Per radiologist's recommendation, I will plan to repeat CT scan in 6 months

## 2016-09-18 NOTE — Assessment & Plan Note (Signed)
According to outside MRI in April, she had abnormal bone lesion in the left hip PET CT scan is negative The patient is reassured

## 2016-09-26 ENCOUNTER — Other Ambulatory Visit (HOSPITAL_BASED_OUTPATIENT_CLINIC_OR_DEPARTMENT_OTHER): Payer: Medicare Other

## 2016-09-26 ENCOUNTER — Telehealth: Payer: Self-pay | Admitting: *Deleted

## 2016-09-26 DIAGNOSIS — C9210 Chronic myeloid leukemia, BCR/ABL-positive, not having achieved remission: Secondary | ICD-10-CM | POA: Diagnosis not present

## 2016-09-26 LAB — COMPREHENSIVE METABOLIC PANEL
ALBUMIN: 3.8 g/dL (ref 3.5–5.0)
ALK PHOS: 43 U/L (ref 40–150)
ALT: 17 U/L (ref 0–55)
ANION GAP: 9 meq/L (ref 3–11)
AST: 28 U/L (ref 5–34)
BILIRUBIN TOTAL: 0.58 mg/dL (ref 0.20–1.20)
BUN: 21.8 mg/dL (ref 7.0–26.0)
CO2: 27 mEq/L (ref 22–29)
Calcium: 8.8 mg/dL (ref 8.4–10.4)
Chloride: 106 mEq/L (ref 98–109)
Creatinine: 1 mg/dL (ref 0.6–1.1)
EGFR: 56 mL/min/{1.73_m2} — AB (ref >90)
GLUCOSE: 91 mg/dL (ref 70–140)
Potassium: 4 mEq/L (ref 3.5–5.1)
Sodium: 142 mEq/L (ref 136–145)
TOTAL PROTEIN: 5.8 g/dL — AB (ref 6.4–8.3)

## 2016-09-26 LAB — CBC WITH DIFFERENTIAL/PLATELET
BASO%: 0.8 % (ref 0.0–2.0)
Basophils Absolute: 0 10*3/uL (ref 0.0–0.1)
EOS ABS: 0.2 10*3/uL (ref 0.0–0.5)
EOS%: 7.3 % — ABNORMAL HIGH (ref 0.0–7.0)
HEMATOCRIT: 28.1 % — AB (ref 34.8–46.6)
HEMOGLOBIN: 9.5 g/dL — AB (ref 11.6–15.9)
LYMPH#: 0.9 10*3/uL (ref 0.9–3.3)
LYMPH%: 36.7 % (ref 14.0–49.7)
MCH: 41.3 pg — ABNORMAL HIGH (ref 25.1–34.0)
MCHC: 33.9 g/dL (ref 31.5–36.0)
MCV: 121.8 fL — AB (ref 79.5–101.0)
MONO#: 0.4 10*3/uL (ref 0.1–0.9)
MONO%: 14.3 % — ABNORMAL HIGH (ref 0.0–14.0)
NEUT%: 40.9 % (ref 38.4–76.8)
NEUTROS ABS: 1 10*3/uL — AB (ref 1.5–6.5)
Platelets: 145 10*3/uL (ref 145–400)
RBC: 2.31 10*6/uL — ABNORMAL LOW (ref 3.70–5.45)
RDW: 13.6 % (ref 11.2–14.5)
WBC: 2.5 10*3/uL — AB (ref 3.9–10.3)

## 2016-09-26 NOTE — Telephone Encounter (Signed)
Notified of results and to hold Gleevec X 1 week. Pt states they were planning to leave for [place] on Friday for 3 weeks. Plan to return on 6/24 and has labs on 6/25.  Pt states they can delay the trip if needed.

## 2016-09-26 NOTE — Telephone Encounter (Signed)
-----   Message from Heath Lark, MD sent at 09/26/2016  9:47 AM EDT ----- Regarding: labs She is actually neutropenic I recommend hold [place] for 1 week and come back next week for repeat labs Can you please set up lab appt? Thanks ----- Message ----- From: Interface, Lab In Three Zero One Sent: 09/26/2016   8:02 AM To: Heath Lark, MD

## 2016-09-26 NOTE — Telephone Encounter (Signed)
Notified of Dr Alvy Bimler message below. Will hold gleevec for 1 week and then take 200 mg (2 tablets ) daily until she repeats labs.

## 2016-09-26 NOTE — Telephone Encounter (Signed)
No need to delay her trip Can she take 200 mg PO daily (previously I refilled a lot of 100 mg pills)? If she can take 200 mg daily, we can just hold Gleevec for 1 weeks and resume at 200 mg daily PO until she comes back

## 2016-09-27 ENCOUNTER — Telehealth: Payer: Self-pay | Admitting: *Deleted

## 2016-09-27 NOTE — Telephone Encounter (Signed)
Pt wanted to let Dr Alvy Bimler know she is having orthopedic surgery when she returns from [place].   Left hip bursectomy, gluteal tendon repair 10/23/16. Has labs here at [facility] on 10/22/16

## 2016-10-18 NOTE — Progress Notes (Signed)
Please place orders in EPIC as patient has a pre-op appointment on 10/23/2016! Thank you! 

## 2016-10-19 NOTE — Progress Notes (Signed)
preop on 6/26.  Need orders in epic. Thank You.

## 2016-10-22 ENCOUNTER — Other Ambulatory Visit: Payer: Self-pay | Admitting: Hematology and Oncology

## 2016-10-22 ENCOUNTER — Other Ambulatory Visit (HOSPITAL_BASED_OUTPATIENT_CLINIC_OR_DEPARTMENT_OTHER): Payer: Medicare Other

## 2016-10-22 DIAGNOSIS — Z853 Personal history of malignant neoplasm of breast: Secondary | ICD-10-CM | POA: Diagnosis not present

## 2016-10-22 DIAGNOSIS — C9210 Chronic myeloid leukemia, BCR/ABL-positive, not having achieved remission: Secondary | ICD-10-CM

## 2016-10-22 LAB — COMPREHENSIVE METABOLIC PANEL
ALT: 17 U/L (ref 0–55)
ANION GAP: 7 meq/L (ref 3–11)
AST: 29 U/L (ref 5–34)
Albumin: 4 g/dL (ref 3.5–5.0)
Alkaline Phosphatase: 49 U/L (ref 40–150)
BUN: 22.2 mg/dL (ref 7.0–26.0)
CALCIUM: 9.7 mg/dL (ref 8.4–10.4)
CHLORIDE: 105 meq/L (ref 98–109)
CO2: 30 meq/L — AB (ref 22–29)
Creatinine: 1 mg/dL (ref 0.6–1.1)
EGFR: 54 mL/min/{1.73_m2} — ABNORMAL LOW (ref >90)
Glucose: 111 mg/dl (ref 70–140)
POTASSIUM: 3.9 meq/L (ref 3.5–5.1)
Sodium: 142 mEq/L (ref 136–145)
Total Bilirubin: 0.43 mg/dL (ref 0.20–1.20)
Total Protein: 6.4 g/dL (ref 6.4–8.3)

## 2016-10-22 LAB — CBC WITH DIFFERENTIAL/PLATELET
BASO%: 0.8 % (ref 0.0–2.0)
BASOS ABS: 0 10*3/uL (ref 0.0–0.1)
EOS%: 2.5 % (ref 0.0–7.0)
Eosinophils Absolute: 0.1 10*3/uL (ref 0.0–0.5)
HEMATOCRIT: 30.5 % — AB (ref 34.8–46.6)
HGB: 10.3 g/dL — ABNORMAL LOW (ref 11.6–15.9)
LYMPH#: 1.2 10*3/uL (ref 0.9–3.3)
LYMPH%: 32.2 % (ref 14.0–49.7)
MCH: 39.5 pg — AB (ref 25.1–34.0)
MCHC: 33.8 g/dL (ref 31.5–36.0)
MCV: 116.9 fL — ABNORMAL HIGH (ref 79.5–101.0)
MONO#: 0.4 10*3/uL (ref 0.1–0.9)
MONO%: 12.3 % (ref 0.0–14.0)
NEUT#: 1.9 10*3/uL (ref 1.5–6.5)
NEUT%: 52.2 % (ref 38.4–76.8)
PLATELETS: 153 10*3/uL (ref 145–400)
RBC: 2.61 10*6/uL — AB (ref 3.70–5.45)
RDW: 12 % (ref 11.2–14.5)
WBC: 3.6 10*3/uL — ABNORMAL LOW (ref 3.9–10.3)

## 2016-10-22 NOTE — Patient Instructions (Addendum)
[name] [name]  10/22/2016   Your procedure is scheduled on: 10-24-16   Report to [facility] Main Entrance Report to admitting at 2:30 PM   Call this number if you have problems the morning of surgery  [phone]   Remember: ONLY 1 PERSON MAY GO WITH YOU TO SHORT STAY TO GET  READY MORNING OF [place].  Do not eat food or drink liquids :After Midnight. From midnight until 11:00 AM, you may have a Clear Liquid Diet. After 11AM, nothing by mouth.      CLEAR LIQUID DIET   Foods Allowed                                                                     Foods Excluded  Coffee and tea, regular and decaf                             liquids that you cannot  Plain Jell-O in any flavor                                             see through such as: Fruit ices (not with fruit pulp)                                     milk, soups, orange juice  Iced Popsicles                                    All solid food Carbonated beverages, regular and diet                                    Cranberry, grape and apple juices Sports drinks like Gatorade Lightly seasoned clear broth or consume(fat free) Sugar, honey syrup  Sample Menu Breakfast                                Lunch                                     Supper Cranberry juice                    Beef broth                            Chicken broth Jell-O                                     Grape juice  Apple juice Coffee or tea                        Jell-O                                      Popsicle                                                Coffee or tea                        Coffee or tea  _____________________________________________________________________     Take these medicines the morning of surgery with A SIP OF WATER:Imatinib ([place])                                You may not have any metal on your body including hair pins and              piercings  Do not  wear jewelry, make-up, lotions, powders or perfumes, deodorant             Do not wear nail polish.  Do not shave  48 hours prior to surgery.           Do not bring valuables to the hospital. [place].  Contacts, dentures or bridgework may not be worn into surgery. .    Patients discharged the day of surgery will not be allowed to drive home.  Name and phone number of your driver: Jori Moll [phone]                Please read over the following fact sheets you were given: _____________________________________________________________________             [facility] - Preparing for Surgery Before surgery, you can play an important role.  Because skin is not sterile, your skin needs to be as free of germs as possible.  You can reduce the number of germs on your skin by washing with CHG (chlorahexidine gluconate) soap before surgery.  CHG is an antiseptic cleaner which kills germs and bonds with the skin to continue killing germs even after washing. Please DO NOT use if you have an allergy to CHG or antibacterial soaps.  If your skin becomes reddened/irritated stop using the CHG and inform your nurse when you arrive at Short Stay. Do not shave (including legs and underarms) for at least 48 hours prior to the first CHG shower.  You may shave your face/neck. Please follow these instructions carefully:  1.  Shower with CHG Soap the night before surgery and the  morning of Surgery.  2.  If you choose to wash your hair, wash your hair first as usual with your  normal  shampoo.  3.  After you shampoo, rinse your hair and body thoroughly to remove the  shampoo.                           4.  Use CHG as you would any other liquid  soap.  You can apply chg directly  to the skin and wash                       Gently with a scrungie or clean washcloth.  5.  Apply the CHG Soap to your body ONLY FROM THE NECK DOWN.   Do not use on face/ open                            Wound or open sores. Avoid contact with eyes, ears mouth and genitals (private parts).                       Wash face,  Genitals (private parts) with your normal soap.             6.  Wash thoroughly, paying special attention to the area where your surgery  will be performed.  7.  Thoroughly rinse your body with warm water from the neck down.  8.  DO NOT shower/wash with your normal soap after using and rinsing off  the CHG Soap.                9.  Pat yourself dry with a clean towel.            10.  Wear clean pajamas.            11.  Place clean sheets on your bed the night of your first shower and do not  sleep with pets. Day of Surgery : Do not apply any lotions/deodorants the morning of surgery.  Please wear clean clothes to the hospital/surgery center.  FAILURE TO FOLLOW THESE INSTRUCTIONS MAY RESULT IN THE CANCELLATION OF YOUR SURGERY PATIENT SIGNATURE_________________________________  NURSE SIGNATURE__________________________________  ________________________________________________________________________

## 2016-10-23 ENCOUNTER — Encounter (HOSPITAL_COMMUNITY)
Admission: RE | Admit: 2016-10-23 | Discharge: 2016-10-23 | Disposition: A | Discharge status: Home / Self Care | Payer: Medicare Other | Source: Ambulatory Visit | Attending: Orthopedic Surgery | Admitting: Orthopedic Surgery

## 2016-10-23 ENCOUNTER — Encounter (HOSPITAL_COMMUNITY): Payer: Self-pay

## 2016-10-23 ENCOUNTER — Ambulatory Visit: Payer: Self-pay | Admitting: Orthopedic Surgery

## 2016-10-23 DIAGNOSIS — Z87891 Personal history of nicotine dependence: Secondary | ICD-10-CM | POA: Diagnosis not present

## 2016-10-23 DIAGNOSIS — M7062 Trochanteric bursitis, left hip: Secondary | ICD-10-CM | POA: Diagnosis present

## 2016-10-23 DIAGNOSIS — M1990 Unspecified osteoarthritis, unspecified site: Secondary | ICD-10-CM | POA: Diagnosis not present

## 2016-10-23 DIAGNOSIS — Z853 Personal history of malignant neoplasm of breast: Secondary | ICD-10-CM | POA: Diagnosis not present

## 2016-10-23 DIAGNOSIS — D649 Anemia, unspecified: Secondary | ICD-10-CM | POA: Diagnosis not present

## 2016-10-23 DIAGNOSIS — Z79899 Other long term (current) drug therapy: Secondary | ICD-10-CM | POA: Diagnosis not present

## 2016-10-23 DIAGNOSIS — Z880 Allergy status to penicillin: Secondary | ICD-10-CM | POA: Diagnosis not present

## 2016-10-23 DIAGNOSIS — Z7982 Long term (current) use of aspirin: Secondary | ICD-10-CM | POA: Diagnosis not present

## 2016-10-23 LAB — SURGICAL PCR SCREEN
MRSA, PCR: NEGATIVE
STAPHYLOCOCCUS AUREUS: NEGATIVE

## 2016-10-23 NOTE — Progress Notes (Signed)
10-22-16 (EPIC) CMP, CBC w/Diff

## 2016-10-24 ENCOUNTER — Encounter (HOSPITAL_COMMUNITY)
Admission: RE | Disposition: A | Discharge status: Home / Self Care | Payer: Self-pay | Source: Ambulatory Visit | Attending: Orthopedic Surgery

## 2016-10-24 ENCOUNTER — Ambulatory Visit (HOSPITAL_COMMUNITY): Payer: Medicare Other | Admitting: Anesthesiology

## 2016-10-24 ENCOUNTER — Encounter (HOSPITAL_COMMUNITY): Payer: Self-pay | Admitting: *Deleted

## 2016-10-24 ENCOUNTER — Observation Stay (HOSPITAL_COMMUNITY)
Admission: RE | Admit: 2016-10-24 | Discharge: 2016-10-25 | Disposition: A | Discharge status: Home / Self Care | Payer: Medicare Other | Source: Ambulatory Visit | Attending: Orthopedic Surgery | Admitting: Orthopedic Surgery

## 2016-10-24 DIAGNOSIS — M1990 Unspecified osteoarthritis, unspecified site: Secondary | ICD-10-CM | POA: Insufficient documentation

## 2016-10-24 DIAGNOSIS — Z79899 Other long term (current) drug therapy: Secondary | ICD-10-CM | POA: Insufficient documentation

## 2016-10-24 DIAGNOSIS — D649 Anemia, unspecified: Secondary | ICD-10-CM | POA: Diagnosis not present

## 2016-10-24 DIAGNOSIS — Z853 Personal history of malignant neoplasm of breast: Secondary | ICD-10-CM | POA: Diagnosis not present

## 2016-10-24 DIAGNOSIS — Z7982 Long term (current) use of aspirin: Secondary | ICD-10-CM | POA: Insufficient documentation

## 2016-10-24 DIAGNOSIS — Z87891 Personal history of nicotine dependence: Secondary | ICD-10-CM | POA: Insufficient documentation

## 2016-10-24 DIAGNOSIS — Z880 Allergy status to penicillin: Secondary | ICD-10-CM | POA: Insufficient documentation

## 2016-10-24 DIAGNOSIS — M7062 Trochanteric bursitis, left hip: Secondary | ICD-10-CM | POA: Diagnosis not present

## 2016-10-24 HISTORY — PX: OPEN SURGICAL REPAIR OF GLUTEAL TENDON: SHX5995

## 2016-10-24 LAB — CBC
HCT: 29.2 % — ABNORMAL LOW (ref 36.0–46.0)
HEMOGLOBIN: 10 g/dL — AB (ref 12.0–15.0)
MCH: 38.5 pg — AB (ref 26.0–34.0)
MCHC: 34.2 g/dL (ref 30.0–36.0)
MCV: 112.3 fL — ABNORMAL HIGH (ref 78.0–100.0)
PLATELETS: 140 10*3/uL — AB (ref 150–400)
RBC: 2.6 MIL/uL — AB (ref 3.87–5.11)
RDW: 11.8 % (ref 11.5–15.5)
WBC: 3.5 10*3/uL — AB (ref 4.0–10.5)

## 2016-10-24 LAB — CREATININE, SERUM
CREATININE: 0.91 mg/dL (ref 0.44–1.00)
GFR, EST NON AFRICAN AMERICAN: 58 mL/min — AB (ref >60)

## 2016-10-24 LAB — TYPE AND SCREEN
ABO/RH(D): O POS
ANTIBODY SCREEN: NEGATIVE

## 2016-10-24 SURGERY — REPAIR, TENDON, GLUTEUS MEDIUS, OPEN
Anesthesia: Spinal | Site: Hip | Laterality: Left

## 2016-10-24 MED ORDER — ACETAMINOPHEN 10 MG/ML IV SOLN
1000.0000 mg | Freq: Once | INTRAVENOUS | Status: AC
  Administered 2016-10-24: 1000 mg via INTRAVENOUS

## 2016-10-24 MED ORDER — LIDOCAINE 2% (20 MG/ML) 5 ML SYRINGE
INTRAMUSCULAR | Status: AC
  Filled 2016-10-24: qty 5

## 2016-10-24 MED ORDER — ONDANSETRON HCL 4 MG/2ML IJ SOLN
INTRAMUSCULAR | Status: AC
  Filled 2016-10-24: qty 2

## 2016-10-24 MED ORDER — FENTANYL CITRATE (PF) 100 MCG/2ML IJ SOLN: 25.0000 ug | INTRAMUSCULAR | Status: DC | PRN

## 2016-10-24 MED ORDER — LACTATED RINGERS IV SOLN
INTRAVENOUS | Status: DC
  Administered 2016-10-24 (×2): via INTRAVENOUS

## 2016-10-24 MED ORDER — BUPIVACAINE LIPOSOME 1.3 % IJ SUSP
INTRAMUSCULAR | Status: DC | PRN
  Administered 2016-10-24: 20 mL

## 2016-10-24 MED ORDER — CHLORHEXIDINE GLUCONATE 4 % EX LIQD: 60.0000 mL | Freq: Once | CUTANEOUS | Status: DC

## 2016-10-24 MED ORDER — EPHEDRINE SULFATE-NACL 50-0.9 MG/10ML-% IV SOSY
PREFILLED_SYRINGE | INTRAVENOUS | Status: DC | PRN
  Administered 2016-10-24 (×2): 10 mg via INTRAVENOUS

## 2016-10-24 MED ORDER — BUPIVACAINE IN DEXTROSE 0.75-8.25 % IT SOLN
INTRATHECAL | Status: DC | PRN
  Administered 2016-10-24: 1.2 mL via INTRATHECAL

## 2016-10-24 MED ORDER — PROPOFOL 10 MG/ML IV BOLUS
INTRAVENOUS | Status: AC
  Filled 2016-10-24: qty 20

## 2016-10-24 MED ORDER — ONDANSETRON HCL 4 MG PO TABS: 4.0000 mg | ORAL_TABLET | Freq: Four times a day (QID) | ORAL | Status: DC | PRN

## 2016-10-24 MED ORDER — ACETAMINOPHEN 10 MG/ML IV SOLN
INTRAVENOUS | Status: AC
  Filled 2016-10-24: qty 100

## 2016-10-24 MED ORDER — ONDANSETRON HCL 4 MG/2ML IJ SOLN: 4.0000 mg | Freq: Four times a day (QID) | INTRAMUSCULAR | Status: DC | PRN

## 2016-10-24 MED ORDER — SODIUM CHLORIDE 0.9 % IJ SOLN
INTRAMUSCULAR | Status: AC
Start: 2016-10-24 — End: 2016-10-24
  Filled 2016-10-24: qty 50

## 2016-10-24 MED ORDER — DEXAMETHASONE SODIUM PHOSPHATE 10 MG/ML IJ SOLN
10.0000 mg | Freq: Once | INTRAMUSCULAR | Status: AC
  Administered 2016-10-24: 10 mg via INTRAVENOUS

## 2016-10-24 MED ORDER — CEFAZOLIN SODIUM-DEXTROSE 2-4 GM/100ML-% IV SOLN
INTRAVENOUS | Status: AC
  Filled 2016-10-24: qty 100

## 2016-10-24 MED ORDER — TRAMADOL HCL 50 MG PO TABS: 50.0000 mg | ORAL_TABLET | Freq: Four times a day (QID) | ORAL | Status: DC | PRN

## 2016-10-24 MED ORDER — METHOCARBAMOL 1000 MG/10ML IJ SOLN
500.0000 mg | Freq: Four times a day (QID) | INTRAVENOUS | Status: DC | PRN
  Administered 2016-10-24: 500 mg via INTRAVENOUS
  Filled 2016-10-24: qty 550

## 2016-10-24 MED ORDER — PROPOFOL 500 MG/50ML IV EMUL
INTRAVENOUS | Status: DC | PRN
  Administered 2016-10-24: 100 ug/kg/min via INTRAVENOUS

## 2016-10-24 MED ORDER — POVIDONE-IODINE 10 % EX SWAB: 2.0000 "application " | Freq: Once | CUTANEOUS | Status: DC

## 2016-10-24 MED ORDER — SODIUM CHLORIDE 0.9 % IJ SOLN
INTRAMUSCULAR | Status: DC | PRN
  Administered 2016-10-24: 60 mL

## 2016-10-24 MED ORDER — MORPHINE SULFATE (PF) 4 MG/ML IV SOLN: 1.0000 mg | INTRAVENOUS | Status: DC | PRN

## 2016-10-24 MED ORDER — BUPIVACAINE LIPOSOME 1.3 % IJ SUSP
20.0000 mL | Freq: Once | INTRAMUSCULAR | Status: DC
  Filled 2016-10-24: qty 20

## 2016-10-24 MED ORDER — CEFAZOLIN SODIUM-DEXTROSE 2-4 GM/100ML-% IV SOLN
2.0000 g | INTRAVENOUS | Status: AC
  Administered 2016-10-24: 2 g via INTRAVENOUS

## 2016-10-24 MED ORDER — METOCLOPRAMIDE HCL 5 MG PO TABS: 5.0000 mg | ORAL_TABLET | Freq: Three times a day (TID) | ORAL | Status: DC | PRN

## 2016-10-24 MED ORDER — METHOCARBAMOL 500 MG PO TABS: 500.0000 mg | ORAL_TABLET | Freq: Four times a day (QID) | ORAL | Status: DC | PRN

## 2016-10-24 MED ORDER — IMATINIB MESYLATE 100 MG PO TABS: 200.0000 mg | ORAL_TABLET | Freq: Every day | ORAL | Status: DC

## 2016-10-24 MED ORDER — IMATINIB MESYLATE 100 MG PO TABS
200.0000 mg | ORAL_TABLET | Freq: Every day | ORAL | Status: DC
  Administered 2016-10-25: 200 mg via ORAL
  Filled 2016-10-24: qty 2

## 2016-10-24 MED ORDER — ENOXAPARIN SODIUM 40 MG/0.4ML ~~LOC~~ SOLN
40.0000 mg | SUBCUTANEOUS | Status: DC
  Administered 2016-10-25: 40 mg via SUBCUTANEOUS
  Filled 2016-10-24: qty 0.4

## 2016-10-24 MED ORDER — SODIUM CHLORIDE 0.9 % IV SOLN
INTRAVENOUS | Status: DC
  Administered 2016-10-25: via INTRAVENOUS

## 2016-10-24 MED ORDER — METOCLOPRAMIDE HCL 5 MG/ML IJ SOLN: 5.0000 mg | Freq: Three times a day (TID) | INTRAMUSCULAR | Status: DC | PRN

## 2016-10-24 MED ORDER — METOCLOPRAMIDE HCL 5 MG PO TABS
5.0000 mg | ORAL_TABLET | Freq: Three times a day (TID) | ORAL | Status: DC | PRN
Start: 2016-10-24 — End: 2016-10-25

## 2016-10-24 MED ORDER — EPHEDRINE 5 MG/ML INJ
INTRAVENOUS | Status: AC
  Filled 2016-10-24: qty 10

## 2016-10-24 MED ORDER — FENTANYL CITRATE (PF) 100 MCG/2ML IJ SOLN
INTRAMUSCULAR | Status: AC
  Filled 2016-10-24: qty 2

## 2016-10-24 MED ORDER — ONDANSETRON HCL 4 MG/2ML IJ SOLN
INTRAMUSCULAR | Status: DC | PRN
  Administered 2016-10-24: 4 mg via INTRAVENOUS

## 2016-10-24 MED ORDER — HYDROCODONE-ACETAMINOPHEN 5-325 MG PO TABS
1.0000 | ORAL_TABLET | ORAL | Status: DC | PRN
  Administered 2016-10-25 (×2): 1 via ORAL
  Filled 2016-10-24 (×2): qty 1

## 2016-10-24 MED ORDER — DEXAMETHASONE SODIUM PHOSPHATE 10 MG/ML IJ SOLN
INTRAMUSCULAR | Status: AC
  Filled 2016-10-24: qty 1

## 2016-10-24 MED ORDER — SODIUM CHLORIDE 0.9 % IJ SOLN
INTRAMUSCULAR | Status: AC
  Filled 2016-10-24: qty 10

## 2016-10-24 MED ORDER — 0.9 % SODIUM CHLORIDE (POUR BTL) OPTIME
TOPICAL | Status: DC | PRN
  Administered 2016-10-24: 1000 mL

## 2016-10-24 SURGICAL SUPPLY — 43 items
ANCH SUT 2 CP-2 EBND QANCHR+ (Anchor) ×2 IMPLANT
ANCHOR SUPER QUICK (Anchor) ×4 IMPLANT
BAG SPEC THK2 15X12 ZIP CLS (MISCELLANEOUS)
BAG ZIPLOCK 12X15 (MISCELLANEOUS) IMPLANT
BLADE EXTENDED COATED 6.5IN (ELECTRODE) ×2 IMPLANT
CLOSURE WOUND 1/2 X4 (GAUZE/BANDAGES/DRESSINGS) ×1
COVER SURGICAL LIGHT HANDLE (MISCELLANEOUS) ×3 IMPLANT
DRAPE INCISE IOBAN 66X45 STRL (DRAPES) ×3 IMPLANT
DRAPE ORTHO SPLIT 77X108 STRL (DRAPES) ×6
DRAPE POUCH INSTRU U-SHP 10X18 (DRAPES) ×3 IMPLANT
DRAPE SURG ORHT 6 SPLT 77X108 (DRAPES) ×2 IMPLANT
DRAPE U-SHAPE 47X51 STRL (DRAPES) ×3 IMPLANT
DRSG ADAPTIC 3X8 NADH LF (GAUZE/BANDAGES/DRESSINGS) ×3 IMPLANT
DRSG MEPILEX BORDER 4X4 (GAUZE/BANDAGES/DRESSINGS) ×2 IMPLANT
DRSG MEPILEX BORDER 4X8 (GAUZE/BANDAGES/DRESSINGS) ×3 IMPLANT
ELECT REM PT RETURN 15FT ADLT (MISCELLANEOUS) ×3 IMPLANT
EVACUATOR 1/8 PVC DRAIN (DRAIN) ×2 IMPLANT
GAUZE SPONGE 4X4 12PLY STRL (GAUZE/BANDAGES/DRESSINGS) ×3 IMPLANT
GLOVE BIO SURGEON STRL SZ7.5 (GLOVE) ×3 IMPLANT
GLOVE BIO SURGEON STRL SZ8 (GLOVE) ×3 IMPLANT
GLOVE BIOGEL PI IND STRL 8 (GLOVE) ×2 IMPLANT
GLOVE BIOGEL PI INDICATOR 8 (GLOVE) ×4
GOWN STRL REUS W/TWL LRG LVL3 (GOWN DISPOSABLE) ×3 IMPLANT
GOWN STRL REUS W/TWL XL LVL3 (GOWN DISPOSABLE) ×3 IMPLANT
KIT BASIN OR (CUSTOM PROCEDURE TRAY) ×3 IMPLANT
MANIFOLD NEPTUNE II (INSTRUMENTS) ×3 IMPLANT
NDL MA TROC 1/2 (NEEDLE) IMPLANT
NDL SAFETY ECLIPSE 18X1.5 (NEEDLE) ×2 IMPLANT
NEEDLE HYPO 18GX1.5 SHARP (NEEDLE) ×6
NEEDLE MA TROC 1/2 (NEEDLE) ×3 IMPLANT
NS IRRIG 1000ML POUR BTL (IV SOLUTION) ×1 IMPLANT
PACK TOTAL JOINT (CUSTOM PROCEDURE TRAY) ×3 IMPLANT
POSITIONER SURGICAL ARM (MISCELLANEOUS) ×3 IMPLANT
STAPLER VISISTAT 35W (STAPLE) IMPLANT
STRIP CLOSURE SKIN 1/2X4 (GAUZE/BANDAGES/DRESSINGS) ×2 IMPLANT
SUT ETHIBOND NAB CT1 #1 30IN (SUTURE) IMPLANT
SUT MNCRL AB 4-0 PS2 18 (SUTURE) ×3 IMPLANT
SUT VIC AB 2-0 CT1 27 (SUTURE) ×9
SUT VIC AB 2-0 CT1 TAPERPNT 27 (SUTURE) ×2 IMPLANT
SUT VLOC 180 0 24IN GS25 (SUTURE) ×3 IMPLANT
SYR 20CC LL (SYRINGE) ×3 IMPLANT
SYR 50ML LL SCALE MARK (SYRINGE) ×3 IMPLANT
TOWEL OR 17X26 10 PK STRL BLUE (TOWEL DISPOSABLE) ×4 IMPLANT

## 2016-10-24 NOTE — Anesthesia Postprocedure Evaluation (Signed)
Anesthesia Post Note  Patient: [name] [name]  Procedure(s) Performed: Procedure(s) (LRB): Left hip bursectomy; gluteal tendon repair (Left)     Patient location during evaluation: PACU Anesthesia Type: Spinal Level of consciousness: awake and alert, oriented and patient cooperative Pain management: pain level controlled Vital Signs Assessment: post-procedure vital signs reviewed and stable Respiratory status: spontaneous breathing, nonlabored ventilation, respiratory function stable and patient connected to nasal cannula oxygen Cardiovascular status: blood pressure returned to baseline and stable Postop Assessment: spinal receding, patient able to bend at knees and no signs of nausea or vomiting Anesthetic complications: no    Last Vitals:  Vitals:   10/24/16 1856 10/24/16 1915  BP: (!) 130/58 140/71  Pulse: 77 69  Resp: 18 18  Temp: 36.5 C 36.5 C    Last Pain:  Vitals:   10/24/16 1856  TempSrc:   PainSc: 0-No pain                 Marsel Gail,E. [name]

## 2016-10-24 NOTE — H&P (Signed)
CC- [name] [name] is a 82 y.o. female who presents with left hip pain  Hip Pain: Patient complains of left hip pain. Onset of the symptoms was several months ago. Inciting event: none. She has had a long history of lateral hip pain which has not responded to therapy and injections. She had a recent MRI showing a gluteus medius tendon tear. She presents now for bursectomy and tendon repair.  Past Medical History:  Diagnosis Date  . Anemia requiring transfusions 2010 & 2015  . Arthritis    DJD  . Breast cancer ([place]) 1993   S/P lumpectomy , radiation, & Tamoxifen  . CML (chronic myeloid leukemia) ([facility])    Dr Alvy Bimler    Past Surgical History:  Procedure Laterality Date  . ABDOMINAL HYSTERECTOMY  ~1989   no BSO, for Dysplasia   . APPENDECTOMY  ~1989  . BILATERAL SALPINGOOPHORECTOMY  2010   with bowel resection  . BREAST SURGERY     Right lumpectomy, s/p radiation 1993 and oral  Tamoxifen x 5 years ([id number])   . CATARACT EXTRACTION  06/08/2014   left eye;Dr Herbert Deaner  . COLONOSCOPY  03/2003, last 2010   diverticulosis, Dr.Kaplan  . EXCISION MORTON'S NEUROMA Left mid 80's   foot  . gastrocslide     left leg; Dr Beola Cord  . ileostomy reversal  2011   3 mos after above  . KNEE SURGERY  mid 80's   Arthroscopy, right knee   . LEG TENDON SURGERY Left 2013   "cut on leg and fixed something"  . sigmoid colon resection  2010    Dr Arlyce Dice, [place] for fistula & diverticulitis  . TOTAL KNEE ARTHROPLASTY Right 06/11/2013   Procedure: RIGHT TOTAL KNEE ARTHROPLASTY;  Surgeon: Sydnee Cabal, MD;  Location: [facility];  Service: Orthopedics;  Laterality: Right;  . TRIGGER FINGER RELEASE Left 2013   thumb    Prior to Admission medications   Medication Sig Start Date End Date Taking? Authorizing Provider  aspirin 325 MG EC tablet Take 650 mg by mouth 2 (two) times daily as needed for pain.   Yes [provider]  aspirin 81 MG tablet Take 81 mg by mouth daily.   Yes [provider]  Biotin 10000 MCG TABS Take 0.25 tablets by mouth daily.   Yes [provider]  Calcium Carb-Cholecalciferol (CALCIUM 600 + D PO) Take 1 tablet by mouth daily.   Yes [provider]  Cyanocobalamin (B-12) 5000 MCG CAPS Take 1 tablet by mouth daily.   Yes [provider]  diclofenac sodium (VOLTAREN) 1 % GEL Apply 2 g topically 4 (four) times daily. Patient taking differently: Apply 2 g topically at bedtime as needed (shoulder pain).  09/01/15  Yes Burns, Claudina Lick, MD  estradiol (ESTRACE VAGINAL) 0.1 MG/GM vaginal cream Place 1 Applicatorful vaginally 2 (two) times a week. Patient taking differently: Place 1 Applicatorful vaginally once a week.  08/17/15  Yes Burns, Claudina Lick, MD  FINACEA 15 % cream Apply 1 application topically daily. 08/03/16  Yes [provider]  folic acid (FOLVITE) 1 MG tablet Take 1 tablet (1 mg total) by mouth daily. --- Office visit needed for further refills 08/02/16  Yes Burns, Claudina Lick, MD  Glucosamine-Chondroit-Vit C-Mn (GLUCOSAMINE CHONDR 1500 COMPLX) CAPS Take 1 capsule by mouth daily.   Yes [provider]  ibuprofen (ADVIL,MOTRIN) 200 MG tablet Take 400 mg by mouth every 6 (six) hours as needed for moderate pain.    Yes [provider]  imatinib (GLEEVEC) 100 MG tablet Take 3 tablets (300 mg total) by mouth 2 (two) times daily. Take with meals and large glass of water.Caution:Chemotherapy Patient taking differently: Take 200 mg by mouth daily. Take with meals and large glass of water.Caution:Chemotherapy 07/06/16  Yes Heath Lark, MD  Loperamide HCl (IMODIUM PO) Take 0.5-1 tablets by mouth 2 (two) times daily as needed (takes 0.5 tablet every morning and 2nd dose if needed for diarrhea).    Yes [provider]  Multiple Vitamin (MULTIVITAMIN) tablet Take 1 tablet by mouth daily.   Yes [provider]  Probiotic Product (ALIGN) 4 MG CAPS Take 1 capsule by mouth daily.   Yes [provider]    Physical Examination: General appearance - alert, well appearing, and in no distress Mental status - alert, oriented to person, place, and time Chest - clear to auscultation, no wheezes, rales or rhonchi, symmetric air entry Heart - normal rate, regular rhythm, normal S1, S2, no murmurs, rubs, clicks or gallops Abdomen - soft, nontender, nondistended, no masses or organomegaly Neurological - alert, oriented, normal speech, no focal findings or movement disorder noted  A left hip exam was performed. GENERAL: no acute distress SKIN: intact SWELLING: none WARMTH: no warmth TENDERNESS: maximal at greater trochanter ROM: normal STRENGTH: normal GAIT: antalgic  ASSESSMENT: Left hip intractable bursitis and gluteal tendon tear  Plan : Left hip bursectomy and gluteal tendon repair. Discussed in detail with patient who elects to proceed.  Dione Plover Tayjah Lobdell, MD    10/24/2016, 3:39 PM

## 2016-10-24 NOTE — Transfer of Care (Signed)
Immediate Anesthesia Transfer of Care Note  Patient: [name] [name]  Procedure(s) Performed: Procedure(s): Left hip bursectomy; gluteal tendon repair (Left)  Patient Location: PACU  Anesthesia Type:Regional  Level of Consciousness: awake, alert  and oriented  Airway & Oxygen Therapy: Patient Spontanous Breathing and Patient connected to face mask oxygen  Post-op Assessment: Report given to RN and Post -op Vital signs reviewed and stable  Post vital signs: Reviewed and stable  Last Vitals:  Vitals:   10/24/16 1528  BP: 140/79  Pulse: 79  Resp: 16  Temp: 37.1 C    Last Pain:  Vitals:   10/24/16 1528  TempSrc: Oral         Complications: No apparent anesthesia complications

## 2016-10-24 NOTE — Op Note (Signed)
OPERATIVE REPORT   PREOPERATIVE DIAGNOSIS: Intractable bursitis,Left hip with gluteal  tendon tear.   POSTOPERATIVE DIAGNOSIS: Intractable bursitis, Left hip with gluteal  tendon tear.   PROCEDURE: Left  hip bursectomy with gluteal tendon repair.   SURGEON: Gaynelle Arabian, M.D.   ASSISTANT: Arlee Muslim, PA-C   ANESTHESIA: Spinal   ESTIMATED BLOOD LOSS: 50 ml   DRAINS: None.   COMPLICATIONS: None.   CONDITION: Stable to recovery.   BRIEF CLINICAL NOTE: [name] [name] is an 82 y.o. female  with  significant intractable  Left lateral hip pain. It has been going on for  a long time now. It is getting progressively worse over time. It has  not responded to injection,or PT.  MRI showed bursitis and   gluteal tendon tear. The patient presents now for Left hip bursectomy and gluteal tendon repair.   PROCEDURE IN DETAIL: After successful administration of general  anesthetic, the patient was placed in the Right lateral decubitus  position with the Left  side up and held with the hip positioner. The   Left lower extremity was isolated from the perineum with plastic drapes  and prepped and draped in the usual sterile fashion. The lateral-based  incision was then made centered over the tip of the greater trochanter  about 4 inches in length. The skin was cut with a #10 blade through  subcutaneous tissue to the fascia lata which incised in line with the  skin incision. There was a fluid-filled bursa present underneath the  fascia lata. I resected this with electrocautery. I palpated the  gluteus medius tendon. There was a large tear  separated and retracted from the greater trochanter.. In addition, there was a large  anterosuperior spur coming off the trochanter and the gluteus medius had  detached from that spur. The spur was removed with a rongeur. I placed  a Mitek suture anchor into the tip of the trochanter and reattached the  gluteus medius to the bone utilizing the sutures  from the suture anchor.  This was very stable repair. In addition, I placed a second suture  anchor about a centimeter anterior to the first anchor and passed it  through the gluteus medius tendon and weaved along the tendon to get  closer approximation to the greater trochanter, essentially expanding  its insertion site and make it more stable. The wound was then copiously irrigated with  saline solution. The fascia lata was closed with interrupted #1 Vicryl  leaving open a small triangular area over the tip of the greater  trochanter to prevent it from rubbing. Subcu was then closed with #1  and 2-0 Vicryl, subcuticular running 4-0 Monocryl. Note that, 20 mL of  Exparel mixed with 60 mL of saline were injected into the fascia lata,  gluteal muscles, and subcu tissues. The subcuticular layer was closed  with running 4-0 Monocryl, and the incision cleaned and dried. Steri-  Strips and sterile dressing applied. The patient was then awakened and  transported to recovery in stable condition.   Gaynelle Arabian, M.D.

## 2016-10-24 NOTE — Anesthesia Preprocedure Evaluation (Addendum)
Anesthesia Evaluation  Patient identified by MRN, date of birth, ID band Patient awake    Reviewed: Allergy & Precautions, NPO status , Patient's Chart, lab work & pertinent test results  History of Anesthesia Complications Negative for: history of anesthetic complications  Airway Mallampati: II  TM Distance: >3 FB Neck ROM: Full    Dental  (+) Dental Advisory Given, Chipped   Pulmonary former smoker,    breath sounds clear to auscultation       Cardiovascular negative cardio ROS   Rhythm:Regular Rate:Normal     Neuro/Psych negative neurological ROS     GI/Hepatic negative GI ROS, Neg liver ROS,   Endo/Other  negative endocrine ROS  Renal/GU negative Renal ROS     Musculoskeletal  (+) Arthritis ,   Abdominal   Peds  Hematology  (+) Blood dyscrasia (CML, Hb 10.3), anemia ,   Anesthesia Other Findings Breast cancer: surgery, XRT, tamoxifen  Reproductive/Obstetrics                            Anesthesia Physical Anesthesia Plan  ASA: III  Anesthesia Plan: Spinal   Post-op Pain Management:    Induction:   PONV Risk Score and Plan: 2 and Ondansetron and Dexamethasone  Airway Management Planned: Natural Airway and Simple Face Mask  Additional Equipment:   Intra-op Plan:   Post-operative Plan:   Informed Consent: I have reviewed the patients History and Physical, chart, labs and discussed the procedure including the risks, benefits and alternatives for the proposed anesthesia with the patient or authorized representative who has indicated his/her understanding and acceptance.   Dental advisory given  Plan Discussed with: CRNA and Surgeon  Anesthesia Plan Comments: (Plan routine monitors, SAB)        Anesthesia Quick Evaluation

## 2016-10-24 NOTE — Anesthesia Procedure Notes (Signed)
Spinal  Patient location during procedure: OR End time: 10/24/2016 4:58 PM Staffing Resident/CRNA: Noralyn Pick D Performed: anesthesiologist and resident/CRNA  Preanesthetic Checklist Completed: patient identified, site marked, surgical consent, pre-op evaluation, timeout performed, IV checked, risks and benefits discussed and monitors and equipment checked Spinal Block Patient position: sitting Prep: Betadine Patient monitoring: heart rate, continuous pulse ox and blood pressure Approach: midline Location: L3-4 Injection technique: single-shot Needle Needle type: Sprotte  Needle gauge: 24 G Needle length: 9 cm Additional Notes Expiration date of kit checked and confirmed. Patient tolerated procedure well, without complications.

## 2016-10-25 ENCOUNTER — Encounter (HOSPITAL_COMMUNITY): Payer: Self-pay | Admitting: Orthopedic Surgery

## 2016-10-25 DIAGNOSIS — M7062 Trochanteric bursitis, left hip: Secondary | ICD-10-CM | POA: Diagnosis not present

## 2016-10-25 MED ORDER — HYDROCODONE-ACETAMINOPHEN 5-325 MG PO TABS: 1.0000 | ORAL_TABLET | ORAL | 0 refills | Status: DC | PRN

## 2016-10-25 MED ORDER — METHOCARBAMOL 500 MG PO TABS: 500.0000 mg | ORAL_TABLET | Freq: Four times a day (QID) | ORAL | 0 refills | Status: DC | PRN

## 2016-10-25 MED ORDER — TRAMADOL HCL 50 MG PO TABS: 50.0000 mg | ORAL_TABLET | Freq: Four times a day (QID) | ORAL | 0 refills | Status: DC | PRN

## 2016-10-25 NOTE — Progress Notes (Addendum)
   Subjective: 1 Day Post-Op Procedure(s) (LRB): Left hip bursectomy; gluteal tendon repair (Left) Patient reports pain as mild.   Patient seen in rounds with Dr. Wynelle Link. Patient is well, but has had some minor complaints of pain in the hip, requiring pain medications Patient is ready to go home today after therapy session.  Objective: Vital signs in last 24 hours: Temp:  [97 F (36.1 C)-98.8 F (37.1 C)] 98.1 F (36.7 C) (06/28 0537) Pulse Rate:  [61-82] 61 (06/28 0537) Resp:  [14-20] 14 (06/28 0537) BP: (89-140)/(58-79) 112/67 (06/28 0537) SpO2:  [99 %-100 %] 99 % (06/28 0537) Weight:  [55.3 kg (122 lb)] 55.3 kg (122 lb) (06/27 1515)  Intake/Output from previous day:  Intake/Output Summary (Last 24 hours) at 10/25/16 0838 Last data filed at 10/25/16 0800  Gross per 24 hour  Intake             3365 ml  Output             1355 ml  Net             2010 ml    Intake/Output this shift: Total I/O In: -  Out: 200 [Urine:200]  EXAM: General - Patient is Alert, Appropriate and Oriented Extremity - Neurovascular intact Sensation intact distally Intact pulses distally Dorsiflexion/Plantar flexion intact Dressing - clean, dry, no drainage Motor Function - intact, moving foot and toes well on exam.  HV pulled  Assessment/Plan: 1 Day Post-Op Procedure(s) (LRB): Left hip bursectomy; gluteal tendon repair (Left) Procedure(s) (LRB): Left hip bursectomy; gluteal tendon repair (Left) Past Medical History:  Diagnosis Date  . Anemia requiring transfusions 2010 & 2015  . Arthritis    DJD  . Breast cancer ([place]) 1993   S/P lumpectomy , radiation, & Tamoxifen  . CML (chronic myeloid leukemia) ([place])    Dr Alvy Bimler   Active Problems:   Greater trochanteric bursitis of left hip  Estimated body mass index is 23.05 kg/m as calculated from the following:   Height as of this encounter: 5\' 1"  (1.549 m).   Weight as of this encounter: 55.3 kg (122 lb). Up with therapy Discharge  home Diet - Regular diet Follow up - in two weeks. Call office for appointment at [phone]. Activity - Weight bearing as tolerated to the surgical leg.  No active abduction of the leg, (no pulling leg out to the side away from the body).  Walker for first several days until comfortable ambulating. May start showering three days following surgery but do not submerge incision under water. Continue to use ice for pain and swelling from surgery.  Resume her Baby Aspirin 81 mg daily at home following discharge.  Please use walker for the first couple of days until comfortable ambulating.  May start changing dressing tomorrow with dry gauze and tape.  Disposition - Home Condition Upon Discharge - Stable D/C Meds - See DC Summary DVT Prophylaxis - Aspirin  Arlee Muslim, PA-C Orthopaedic Surgery 10/25/2016, 8:38 AM

## 2016-10-25 NOTE — Discharge Instructions (Signed)
Dr. Gaynelle Arabian [facility]ist [facility] [address] [place], [place] [address] [phone]  Bursectomy / Gluteal Tendon Repair Discharge Instructions  HOME CARE INSTRUCTIONS  Remove items at home which could result in a fall. This includes throw rugs or furniture in walking pathways.   ICE to the affected hip every three hours for 30 minutes at a time and then as needed for pain and swelling.  Continue to use ice on the hip for pain and swelling from surgery. You may notice swelling that will progress down to the foot and ankle.  This is normal after surgery.  Elevate the leg when you are not up walking on it.    Continue to use the breathing machine which will help keep your temperature down.  It is common for your temperature to cycle up and down following surgery, especially at night when you are not up moving around and exerting yourself.  The breathing machine keeps your lungs expanded and your temperature down. Sit on high chairs which makes it easier to stand.  Sit on chairs with arms. Use the chair arms to help push yourself up when arising.   No Active Abduction of the leg (No pulling leg out to the side away from the body).  Do not lead up the steps with the surgical leg.  Take steps one at a time.  Use your walker for first several days until comfortable ambulating.  DIET You may resume your previous home diet once your are discharged from the hospital.  DRESSING / WOUND CARE / SHOWERING You may shower 3 days after surgery, but keep the wounds dry during showering.  You may use an occlusive plastic wrap (Press'n Seal for example), NO SOAKING/SUBMERGING IN THE BATHTUB.  If the bandage gets wet, change with a clean dry gauze.  If the incision gets wet, pat the wound dry with a clean towel. You may start showering three days following surgery but do not submerge the incision under water.Just pat the incision dry and apply a dry gauze  dressing on daily. Change the surgical dressing daily and reapply a dry dressing each time.  ACTIVITY Walk with your walker as instructed. Use walker as long as suggested by your caregivers. Avoid periods of inactivity such as sitting longer than an hour when not asleep. This helps prevent blood clots.  You may resume a sexual relationship in one month or when given the OK by your doctor.  You may return to work once you are cleared by your doctor.  Do not drive a car for 6 weeks or until released by you surgeon.  Do not drive while taking narcotics.  WEIGHT BEARING Weight bearing as tolerated with assist device (walker, cane, etc) as directed, use it as long as suggested by your surgeon or therapist, typically at least 4-6 weeks.  POSTOPERATIVE CONSTIPATION PROTOCOL Constipation - defined medically as fewer than three stools per week and severe constipation as less than one stool per week.  One of the most common issues patients have following surgery is constipation.  Even if you have a regular bowel pattern at home, your normal regimen is likely to be disrupted due to multiple reasons following surgery.  Combination of anesthesia, postoperative narcotics, change in appetite and fluid intake all can affect your bowels.  In order to avoid complications following surgery, here are some recommendations in order to help you during your recovery period.  Colace (docusate) - Pick up an over-the-counter  form of Colace or another stool softener and take twice a day as long as you are requiring postoperative pain medications.  Take with a full glass of water daily.  If you experience loose stools or diarrhea, hold the colace until you stool forms back up.  If your symptoms do not get better within 1 week or if they get worse, check with your doctor.  Dulcolax (bisacodyl) - Pick up over-the-counter and take as directed by the product packaging as needed to assist with the movement of your bowels.  Take  with a full glass of water.  Use this product as needed if not relieved by Colace only.   MiraLax (polyethylene glycol) - Pick up over-the-counter to have on hand.  MiraLax is a solution that will increase the amount of water in your bowels to assist with bowel movements.  Take as directed and can mix with a glass of water, juice, soda, coffee, or tea.  Take if you go more than two days without a movement. Do not use MiraLax more than once per day. Call your doctor if you are still constipated or irregular after using this medication for 7 days in a row.  If you continue to have problems with postoperative constipation, please contact the office for further assistance and recommendations.  If you experience "the worst abdominal pain ever" or develop nausea or vomiting, please contact the office immediatly for further recommendations for treatment.  ITCHING  If you experience itching with your medications, try taking only a single pain pill, or even half a pain pill at a time.  You can also use Benadryl over the counter for itching or also to help with sleep.   TED HOSE STOCKINGS Wear the elastic stockings on both legs for three weeks following surgery during the day but you may remove then at night for sleeping.  MEDICATIONS See your medication summary on the After Visit Summary that the nursing staff will review with you prior to discharge.  You may have some home medications which will be placed on hold until you complete the course of blood thinner medication.  It is important for you to complete the blood thinner medication as prescribed by your surgeon.  Continue your approved medications as instructed at time of discharge.  PRECAUTIONS If you experience chest pain or shortness of breath - call 911 immediately for transfer to the hospital emergency department.  If you develop a fever greater that 101 F, purulent drainage from wound, increased redness or drainage from wound, foul odor from  the wound/dressing, or calf pain - CONTACT YOUR SURGEON.                                                   FOLLOW-UP APPOINTMENTS Make sure you keep all of your appointments after your operation with your surgeon and caregivers. You should call the office at the above phone number and make an appointment for approximately two weeks after the date of your surgery or on the date instructed by your surgeon outlined in the "After Visit Summary".   Pick up stool softner and laxative for home use following surgery while on pain medications. Do not submerge incision under water. Please use good hand washing techniques while changing dressing each day. May shower starting three days after surgery. Please use a clean towel to pat  the incision dry following showers. Continue to use ice for pain and swelling after surgery. Do not use any lotions or creams on the incision until instructed by your surgeon.  Resume the baby Aspirin 81 mg Daily at home following discharge.

## 2016-10-25 NOTE — Evaluation (Signed)
Physical Therapy Evaluation Patient Details Name: [name] [name] MRN: [id number] DOB: 25-Feb-1935 Today's Date: 10/25/2016   History of Present Illness  s/p L gluteal tendon repair and bursectomy  Clinical Impression  Patient evaluated by Physical Therapy with no further acute PT needs identified. All education has been completed and the patient has no further questions. See below for any follow-up Physical Therapy or equipment needs. PT is signing off. Thank you for this referral.     Follow Up Recommendations No PT follow up    Equipment Recommendations  None recommended by PT    Recommendations for Other Services       Precautions / Restrictions Precautions Precautions: Fall Restrictions Weight Bearing Restrictions: No      Mobility  Bed Mobility               General bed mobility comments: pt reports IND  Transfers Overall transfer level: Needs assistance Equipment used: Rolling walker (2 wheeled) Transfers: Sit to/from Stand Sit to Stand: Supervision         General transfer comment: cues for hand placement  Ambulation/Gait Ambulation/Gait assistance: Supervision Ambulation Distance (Feet): 100 Feet (10' without AD) Assistive device: Rolling walker (2 wheeled) Gait Pattern/deviations: Step-through pattern;Decreased stance time - left     General Gait Details: cues for RW safety  Stairs            Wheelchair Mobility    Modified Rankin (Stroke Patients Only)       Balance Overall balance assessment: No apparent balance deficits (not formally assessed)                                           Pertinent Vitals/Pain Pain Assessment: 0-10 Pain Score: 1  Pain Location: sore Pain Descriptors / Indicators: Sore Pain Intervention(s): Limited activity within patient's tolerance;Monitored during session;Ice applied    Home Living Family/patient expects to be discharged to:: Private residence Living Arrangements:  Spouse/significant other Available Help at Discharge: Family Type of Home: House Home Access: Stairs to enter   Technical brewer of Steps: 1 Home Layout: One level Home Equipment: None Additional Comments: can borrow RW and cane from church; discussed walker adjustment and correct size    Prior Function                 Hand Dominance        Extremity/Trunk Assessment   Upper Extremity Assessment Upper Extremity Assessment: Overall WFL for tasks assessed    Lower Extremity Assessment Lower Extremity Assessment: LLE deficits/detail LLE Deficits / Details: ankle and knee WFL; hip NT d/t precautions       Communication      Cognition Arousal/Alertness: Awake/alert Behavior During Therapy: WFL for tasks assessed/performed Overall Cognitive Status: Within Functional Limits for tasks assessed                                        General Comments      Exercises     Assessment/Plan    PT Assessment Patent does not need any further PT services  PT Problem List         PT Treatment Interventions      PT Goals (Current goals can be found in the Care Plan section)  Acute Rehab PT Goals Patient Stated Goal:  less pain PT Goal Formulation: All assessment and education complete, DC therapy    Frequency     Barriers to discharge        Co-evaluation               AM-PAC PT "6 Clicks" Daily Activity  Outcome Measure Difficulty turning over in bed (including adjusting bedclothes, sheets and blankets)?: None Difficulty moving from lying on back to sitting on the side of the bed? : None Difficulty sitting down on and standing up from a chair with arms (e.g., wheelchair, bedside commode, etc,.)?: None Help needed moving to and from a bed to chair (including a wheelchair)?: None Help needed walking in hospital room?: A Little Help needed climbing 3-5 steps with a railing? : A Little 6 Click Score: 22    End of Session   Activity  Tolerance: Patient tolerated treatment well Patient left: in chair;with call bell/phone within reach;with family/visitor present   PT Visit Diagnosis: Other abnormalities of gait and mobility (R26.89)    Time: 1002-1016 PT Time Calculation (min) (ACUTE ONLY): 14 min   Charges:   PT Evaluation $PT Eval Low Complexity: 1 Procedure     PT G Codes:   PT G-Codes **NOT FOR INPATIENT CLASS** Functional Assessment Tool Used: AM-PAC 6 Clicks Basic Mobility;Clinical judgement Functional Limitation: Mobility: Walking and moving around Mobility: Walking and Moving Around Current Status (C8022): At least 1 percent but less than 20 percent impaired, limited or restricted Mobility: Walking and Moving Around Goal Status [phone]): At least 1 percent but less than 20 percent impaired, limited or restricted Mobility: Walking and Moving Around Discharge Status [phone]): At least 1 percent but less than 20 percent impaired, limited or restricted    Kenyon Ana, PT Pager: [phone] 10/25/2016   [facility] 10/25/2016, 10:22 AM

## 2016-10-25 NOTE — Discharge Summary (Signed)
Physician Discharge Summary   Patient ID: [name] [name] MRN: [id number] DOB/AGE: 80-Jan-1936 80 y.o.  Admit date: 10/24/2016 Discharge date: 10-25-2016  Primary Diagnosis:  Intractable bursitis,Left hip with gluteal  tendon tear.    Admission Diagnoses:  Past Medical History:  Diagnosis Date  . Anemia requiring transfusions 2010 & 2015  . Arthritis    DJD  . Breast cancer ([place]) 1993   S/P lumpectomy , radiation, & Tamoxifen  . CML (chronic myeloid leukemia) ([place])    Dr Alvy Bimler   Discharge Diagnoses:   Active Problems:   Greater trochanteric bursitis of left hip  Estimated body mass index is 23.05 kg/m as calculated from the following:   Height as of this encounter: _0  (1.549 m).   Weight as of this encounter: 55.3 kg (122 lb).  Procedure(s) (LRB): Left hip bursectomy; gluteal tendon repair (Left)   Consults: None  HPI: [name] [name] is an 82 y.o. female  with  significant intractable  Left lateral hip pain. It has been going on for  a long time now. It is getting progressively worse over time. It has  not responded to injection,or PT.  MRI showed bursitis and   gluteal tendon tear. The patient presents now for Left hip bursectomy and gluteal tendon repair.   Laboratory Data: Admission on 10/24/2016  Component Date Value Ref Range Status  . ABO/RH(D) 10/23/2016 O POS   Final  . Antibody Screen 10/23/2016 NEG   Final  . Sample Expiration 10/23/2016 10/27/2016   Final  . Extend sample reason 10/23/2016 NO TRANSFUSIONS OR PREGNANCY IN THE PAST 3 MONTHS   Final  . MRSA, PCR 10/23/2016 NEGATIVE  NEGATIVE Final  . Staphylococcus aureus 10/23/2016 NEGATIVE  NEGATIVE Final   Comment:        The Xpert SA Assay (FDA approved for NASAL specimens in patients over 57 years of age), is one component of a comprehensive surveillance program.  Test performance has been validated by [facility] for patients greater than or equal to 80 year old. It is not intended to  diagnose infection nor to guide or monitor treatment.   . WBC 10/24/2016 3.5* 4.0 - 10.5 K/uL Final  . RBC 10/24/2016 2.60* 3.87 - 5.11 MIL/uL Final  . Hemoglobin 10/24/2016 10.0* 12.0 - 15.0 g/dL Final  . HCT 10/24/2016 29.2* 36.0 - 46.0 % Final  . MCV 10/24/2016 112.3* 78.0 - 100.0 fL Final  . MCH 10/24/2016 38.5* 26.0 - 34.0 pg Final  . MCHC 10/24/2016 34.2  30.0 - 36.0 g/dL Final  . RDW 10/24/2016 11.8  11.5 - 15.5 % Final  . Platelets 10/24/2016 140* 150 - 400 K/uL Final  . Creatinine, Ser 10/24/2016 0.91  0.44 - 1.00 mg/dL Final  . GFR calc non Af Amer 10/24/2016 58* >60 mL/min Final  . GFR calc Af Amer 10/24/2016 >60  >60 mL/min Final   Comment: (NOTE) The eGFR has been calculated using the CKD EPI equation. This calculation has not been validated in all clinical situations. eGFR's persistently <60 mL/min signify possible Chronic Kidney Disease.   Appointment on 10/22/2016  Component Date Value Ref Range Status  . WBC 10/22/2016 3.6* 3.9 - 10.3 10e3/uL Final  . NEUT# 10/22/2016 1.9  1.5 - 6.5 10e3/uL Final  . HGB 10/22/2016 10.3* 11.6 - 15.9 g/dL Final  . HCT 10/22/2016 30.5* 34.8 - 46.6 % Final  . Platelets 10/22/2016 153  145 - 400 10e3/uL Final  . MCV 10/22/2016 116.9* 79.5 - 101.0 fL  Final  . MCH 10/22/2016 39.5* 25.1 - 34.0 pg Final  . MCHC 10/22/2016 33.8  31.5 - 36.0 g/dL Final  . RBC 10/22/2016 2.61* 3.70 - 5.45 10e6/uL Final  . RDW 10/22/2016 12.0  11.2 - 14.5 % Final  . lymph# 10/22/2016 1.2  0.9 - 3.3 10e3/uL Final  . MONO# 10/22/2016 0.4  0.1 - 0.9 10e3/uL Final  . Eosinophils Absolute 10/22/2016 0.1  0.0 - 0.5 10e3/uL Final  . Basophils Absolute 10/22/2016 0.0  0.0 - 0.1 10e3/uL Final  . NEUT% 10/22/2016 52.2  38.4 - 76.8 % Final  . LYMPH% 10/22/2016 32.2  14.0 - 49.7 % Final  . MONO% 10/22/2016 12.3  0.0 - 14.0 % Final  . EOS% 10/22/2016 2.5  0.0 - 7.0 % Final  . BASO% 10/22/2016 0.8  0.0 - 2.0 % Final  . Sodium 10/22/2016 142  136 - 145 mEq/L Final  .  Potassium 10/22/2016 3.9  3.5 - 5.1 mEq/L Final  . Chloride 10/22/2016 105  98 - 109 mEq/L Final  . CO2 10/22/2016 30* 22 - 29 mEq/L Final  . Glucose 10/22/2016 111  70 - 140 mg/dl Final   Glucose reference range is for nonfasting patients. Fasting glucose reference range is 70- 100.  Marland Kitchen BUN 10/22/2016 22.2  7.0 - 26.0 mg/dL Final  . Creatinine 10/22/2016 1.0  0.6 - 1.1 mg/dL Final  . Total Bilirubin 10/22/2016 0.43  0.20 - 1.20 mg/dL Final  . Alkaline Phosphatase 10/22/2016 49  40 - 150 U/L Final  . AST 10/22/2016 29  5 - 34 U/L Final  . ALT 10/22/2016 17  0 - 55 U/L Final  . Total Protein 10/22/2016 6.4  6.4 - 8.3 g/dL Final  . Albumin 10/22/2016 4.0  3.5 - 5.0 g/dL Final  . Calcium 10/22/2016 9.7  8.4 - 10.4 mg/dL Final  . Anion Gap 10/22/2016 7  3 - 11 mEq/L Final  . EGFR 10/22/2016 54* >90 ml/min/1.73 m2 Final   eGFR is calculated using the CKD-EPI Creatinine Equation (2009)  Appointment on 09/26/2016  Component Date Value Ref Range Status  . WBC 09/26/2016 2.5* 3.9 - 10.3 10e3/uL Final  . NEUT# 09/26/2016 1.0* 1.5 - 6.5 10e3/uL Final  . HGB 09/26/2016 9.5* 11.6 - 15.9 g/dL Final  . HCT 09/26/2016 28.1* 34.8 - 46.6 % Final  . Platelets 09/26/2016 145  145 - 400 10e3/uL Final  . MCV 09/26/2016 121.8* 79.5 - 101.0 fL Final  . MCH 09/26/2016 41.3* 25.1 - 34.0 pg Final  . MCHC 09/26/2016 33.9  31.5 - 36.0 g/dL Final  . RBC 09/26/2016 2.31* 3.70 - 5.45 10e6/uL Final  . RDW 09/26/2016 13.6  11.2 - 14.5 % Final  . lymph# 09/26/2016 0.9  0.9 - 3.3 10e3/uL Final  . MONO# 09/26/2016 0.4  0.1 - 0.9 10e3/uL Final  . Eosinophils Absolute 09/26/2016 0.2  0.0 - 0.5 10e3/uL Final  . Basophils Absolute 09/26/2016 0.0  0.0 - 0.1 10e3/uL Final  . NEUT% 09/26/2016 40.9  38.4 - 76.8 % Final  . LYMPH% 09/26/2016 36.7  14.0 - 49.7 % Final  . MONO% 09/26/2016 14.3* 0.0 - 14.0 % Final  . EOS% 09/26/2016 7.3* 0.0 - 7.0 % Final  . BASO% 09/26/2016 0.8  0.0 - 2.0 % Final  . Sodium 09/26/2016 142  136 -  145 mEq/L Final  . Potassium 09/26/2016 4.0  3.5 - 5.1 mEq/L Final  . Chloride 09/26/2016 106  98 - 109 mEq/L Final  . CO2 09/26/2016  27  22 - 29 mEq/L Final  . Glucose 09/26/2016 91  70 - 140 mg/dl Final   Glucose reference range is for nonfasting patients. Fasting glucose reference range is 70- 100.  Marland Kitchen BUN 09/26/2016 21.8  7.0 - 26.0 mg/dL Final  . Creatinine 09/26/2016 1.0  0.6 - 1.1 mg/dL Final  . Total Bilirubin 09/26/2016 0.58  0.20 - 1.20 mg/dL Final  . Alkaline Phosphatase 09/26/2016 43  40 - 150 U/L Final  . AST 09/26/2016 28  5 - 34 U/L Final  . ALT 09/26/2016 17  0 - 55 U/L Final  . Total Protein 09/26/2016 5.8* 6.4 - 8.3 g/dL Final  . Albumin 09/26/2016 3.8  3.5 - 5.0 g/dL Final  . Calcium 09/26/2016 8.8  8.4 - 10.4 mg/dL Final  . Anion Gap 09/26/2016 9  3 - 11 mEq/L Final  . EGFR 09/26/2016 56* >90 ml/min/1.73 m2 Final   eGFR is calculated using the CKD-EPI Creatinine Equation (2009)  Hospital Outpatient Visit on 09/17/2016  Component Date Value Ref Range Status  . Glucose-Capillary 09/17/2016 91  65 - 99 mg/dL Final     X-Rays:No results found.  EKG: Orders placed or performed in visit on 02/27/13  . EKG 12-Lead     Hospital Course: Patient was admitted to [facility] and taken to the OR and underwent the above state procedure without complications.  Patient tolerated the procedure well and was later transferred to the recovery room and then to the orthopaedic floor for postoperative care.  They were given PO and IV analgesics for pain control following their surgery.  They were given 24 hours of postoperative antibiotics of  Anti-infectives    Start     Dose/Rate Route Frequency Ordered Stop   10/25/16 0600  ceFAZolin (ANCEF) IVPB 2g/100 mL premix     2 g 200 mL/hr over 30 Minutes Intravenous On call to O.R. 10/24/16 1434 10/24/16 1700   10/24/16 1445  ceFAZolin (ANCEF) 2-4 GM/100ML-% IVPB    Comments:  Bridget Hartshorn   : cabinet override      10/24/16  1445 10/24/16 1700     and started on DVT prophylaxis in the form of Lovenox. Patient was encouraged to get up and ambulate the day after surgery.  The patient was allowed to be WBAT with therapy. Discharge planning was consulted to help with postop disposition and equipment needs.  Patient had a decent night on the evening of surgery.  They started to get up OOB with therapy on day one.   Patient was seen in rounds and was ready to go home following morning ambulation.  Diet - Regular diet Follow up - in two weeks. Call office for appointment at [phone]. Activity - Weight bearing as tolerated to the surgical leg.  No active abduction of the leg, no pulling leg out to the side away from the body.  Walker for first several days until comfortable ambulating. May start showering three days following surgery but do not submerge incision under water. Continue to use ice for pain and swelling from surgery.  Resume the Baby Aspirin 81 mg daily at home. Please use walker for the first couple of days until comfortable ambulating.  May start changing dressing tomorrow with dry gauze and tape.  Disposition - Home Condition Upon Discharge - Stable D/C Meds - See DC Summary DVT Prophylaxis - Aspirin   Discharge Instructions    Call MD / Call 911    Complete by:  As directed  If you experience chest pain or shortness of breath, CALL 911 and be transported to the hospital emergency room.  If you develope a fever above 101 F, pus (white drainage) or increased drainage or redness at the wound, or calf pain, call your surgeon's office.   Constipation Prevention    Complete by:  As directed    Drink plenty of fluids.  Prune juice may be helpful.  You may use a stool softener, such as Colace (over the counter) 100 mg twice a day.  Use MiraLax (over the counter) for constipation as needed.   Diet - low sodium heart healthy    Complete by:  As directed    Discharge instructions    Complete by:  As directed      Resume the baby Aspirin 81 mg daily at home following discharge.  Pick up stool softner and laxative for home use following surgery while on pain medications. Do not submerge incision under water. Please use good hand washing techniques while changing dressing each day. May shower starting three days after surgery. Please use a clean towel to pat the incision dry following showers. Continue to use ice for pain and swelling after surgery. Do not use any lotions or creams on the incision until instructed by your surgeon.  Wear both TED hose on both legs during the day every day for three weeks, but may remove the TED hose at night at home.  Postoperative Constipation Protocol  Constipation - defined medically as fewer than three stools per week and severe constipation as less than one stool per week.  One of the most common issues patients have following surgery is constipation.  Even if you have a regular bowel pattern at home, your normal regimen is likely to be disrupted due to multiple reasons following surgery.  Combination of anesthesia, postoperative narcotics, change in appetite and fluid intake all can affect your bowels.  In order to avoid complications following surgery, here are some recommendations in order to help you during your recovery period.  Colace (docusate) - Pick up an over-the-counter form of Colace or another stool softener and take twice a day as long as you are requiring postoperative pain medications.  Take with a full glass of water daily.  If you experience loose stools or diarrhea, hold the colace until you stool forms back up.  If your symptoms do not get better within 1 week or if they get worse, check with your doctor.  Dulcolax (bisacodyl) - Pick up over-the-counter and take as directed by the product packaging as needed to assist with the movement of your bowels.  Take with a full glass of water.  Use this product as needed if not relieved by Colace only.    MiraLax (polyethylene glycol) - Pick up over-the-counter to have on hand.  MiraLax is a solution that will increase the amount of water in your bowels to assist with bowel movements.  Take as directed and can mix with a glass of water, juice, soda, coffee, or tea.  Take if you go more than two days without a movement. Do not use MiraLax more than once per day. Call your doctor if you are still constipated or irregular after using this medication for 7 days in a row.  If you continue to have problems with postoperative constipation, please contact the office for further assistance and recommendations.  If you experience "the worst abdominal pain ever" or develop nausea or vomiting, please contact the office immediatly for  further recommendations for treatment.   Do not sit on low chairs, stoools or toilet seats, as it may be difficult to get up from low surfaces    Complete by:  As directed    Driving restrictions    Complete by:  As directed    No driving until released by the physician.   Increase activity slowly as tolerated    Complete by:  As directed    Lifting restrictions    Complete by:  As directed    No lifting until released by the physician.   Patient may shower    Complete by:  As directed    You may shower without a dressing once there is no drainage.  Do not wash over the wound.  If drainage remains, do not shower until drainage stops.   Weight bearing as tolerated    Complete by:  As directed    Laterality:  left   Extremity:  Lower   NO ACTIVE ABDUCTION  DO NOT LEAD WITH THE LEFT LEG GOING UP STAIRS.     Allergies as of 10/25/2016      Reactions   Penicillins Itching, Rash   Hives Because of a history of documented adverse serious drug reaction;Medi Alert bracelet  is recommended Has patient had a PCN reaction causing immediate rash, facial/tongue/throat swelling, SOB or lightheadedness with hypotension: No Has patient had a PCN reaction causing severe rash  involving mucus membranes or skin necrosis: Yes Has patient had a PCN reaction that required hospitalization: No Has patient had a PCN reaction occurring within the last 10 years: No If all of the above answers are      Medication List    TAKE these medications   ALIGN 4 MG Caps Take 1 capsule by mouth daily. Notes to patient:  Home regimen   aspirin 81 MG tablet Take 81 mg by mouth daily.   aspirin 325 MG EC tablet Take 650 mg by mouth 2 (two) times daily as needed for pain.   B-12 5000 MCG Caps Take 1 tablet by mouth daily.   Biotin 10000 MCG Tabs Take 0.25 tablets by mouth daily. Notes to patient:  Home regimen   CALCIUM 600 + D PO Take 1 tablet by mouth daily. Notes to patient:  Home regimen   diclofenac sodium 1 % Gel Commonly known as:  VOLTAREN Apply 2 g topically 4 (four) times daily. What changed:  when to take this  reasons to take this   estradiol 0.1 MG/GM vaginal cream Commonly known as:  ESTRACE VAGINAL Place 1 Applicatorful vaginally 2 (two) times a week. What changed:  when to take this   FINACEA 15 % cream Generic drug:  Azelaic Acid Apply 1 application topically daily. Notes to patient:  Home regimen   folic acid 1 MG tablet Commonly known as:  FOLVITE Take 1 tablet (1 mg total) by mouth daily. --- Office visit needed for further refills Notes to patient:  Home regimen   GLUCOSAMINE CHONDR 1500 COMPLX Caps Take 1 capsule by mouth daily. Notes to patient:  Home regimen   HYDROcodone-acetaminophen 5-325 MG tablet Commonly known as:  NORCO/VICODIN Take 1-2 tablets by mouth every 4 (four) hours as needed (breakthrough pain).   ibuprofen 200 MG tablet Commonly known as:  ADVIL,MOTRIN Take 400 mg by mouth every 6 (six) hours as needed for moderate pain.   imatinib 100 MG tablet Commonly known as:  GLEEVEC Take 3 tablets (300 mg total) by mouth 2 (two)  times daily. Take with meals and large glass of water.Caution:Chemotherapy What  changed:  how much to take  when to take this  additional instructions   IMODIUM PO Take 0.5-1 tablets by mouth 2 (two) times daily as needed (takes 0.5 tablet every morning and 2nd dose if needed for diarrhea).   methocarbamol 500 MG tablet Commonly known as:  ROBAXIN Take 1 tablet (500 mg total) by mouth every 6 (six) hours as needed for muscle spasms.   multivitamin tablet Take 1 tablet by mouth daily.   traMADol 50 MG tablet Commonly known as:  ULTRAM Take 1-2 tablets (50-100 mg total) by mouth every 6 (six) hours as needed for moderate pain.      Follow-up Information    Gaynelle Arabian, MD. Schedule an appointment as soon as possible for a visit on 11/06/2016.   Specialty:  Orthopedic Surgery Contact information: [address] [place] [address] [phone]           Signed: Arlee Muslim, PA-C Orthopaedic Surgery 10/25/2016, 8:47 AM

## 2016-10-25 NOTE — Progress Notes (Signed)
RN reviewed discharge instructions with patient and family. All questions answered.   Paperwork and prescriptions given.   NT rolled patient down with all belongings to family car. 

## 2016-11-01 ENCOUNTER — Encounter (HOSPITAL_COMMUNITY): Payer: Self-pay | Admitting: Orthopedic Surgery

## 2016-11-02 ENCOUNTER — Telehealth: Payer: Self-pay | Admitting: *Deleted

## 2016-11-02 NOTE — Telephone Encounter (Signed)
Pt notified of labs results. To have repeat labs in September in [place] and then see Dr Alvy Bimler in November. Continue with 200 mg Gleevec.

## 2016-11-08 ENCOUNTER — Other Ambulatory Visit: Payer: Self-pay | Admitting: Hematology and Oncology

## 2016-11-08 DIAGNOSIS — C9210 Chronic myeloid leukemia, BCR/ABL-positive, not having achieved remission: Secondary | ICD-10-CM

## 2016-11-12 ENCOUNTER — Other Ambulatory Visit: Payer: Self-pay | Admitting: *Deleted

## 2016-11-12 DIAGNOSIS — C9210 Chronic myeloid leukemia, BCR/ABL-positive, not having achieved remission: Secondary | ICD-10-CM

## 2016-11-15 ENCOUNTER — Telehealth: Payer: Self-pay | Admitting: Internal Medicine

## 2016-11-15 NOTE — Telephone Encounter (Signed)
Pt is over due for appt she was due back in April for her annual appt. Inform pt she need to make appt for refills may send a 30 day script only if appt has been made...[name] [name]

## 2016-11-15 NOTE — Telephone Encounter (Signed)
Pt is currently in her second home in [place] and would like a refill of folic acid (FOLVITE) 1 MG tablet   [facility] on [address] in [place]

## 2016-11-16 MED ORDER — FOLIC ACID 1 MG PO TABS: 1.0000 mg | ORAL_TABLET | Freq: Every day | ORAL | 0 refills | Status: DC

## 2016-11-16 NOTE — Telephone Encounter (Signed)
Appt was made for October 30th, Pt is living in [place] for the remainder of the summer and will be back in fall. Can enough be sent in to get her through to October 30th?

## 2016-11-16 NOTE — Telephone Encounter (Signed)
Notified pt rx has been sent to [facility] pharmacy must keep appt for future refills

## 2016-12-10 ENCOUNTER — Other Ambulatory Visit: Payer: Self-pay | Admitting: Ophthalmology

## 2016-12-10 DIAGNOSIS — M19011 Primary osteoarthritis, right shoulder: Secondary | ICD-10-CM

## 2016-12-13 ENCOUNTER — Telehealth: Payer: Self-pay | Admitting: Emergency Medicine

## 2016-12-13 NOTE — Telephone Encounter (Signed)
Tried contacting pt to schedule appt for surgical clearance. Unable to LVM on home or mobile number.

## 2016-12-26 NOTE — Telephone Encounter (Signed)
Have attempted multiple times to contact pt to schedule surgical clearance. LVM again with pt to contact office to schedule appt with Dr Quay Burow before she can be cleared for surgery.

## 2017-01-07 ENCOUNTER — Telehealth: Payer: Self-pay | Admitting: *Deleted

## 2017-01-07 NOTE — Telephone Encounter (Signed)
Pt states she is having rotator cuff surgery in November and needs to move her appt for lab and Dr Alvy Bimler to end of October.  Msg to scheduler

## 2017-01-08 ENCOUNTER — Other Ambulatory Visit: Payer: Self-pay | Admitting: Hematology and Oncology

## 2017-01-08 ENCOUNTER — Telehealth: Payer: Self-pay | Admitting: Hematology and Oncology

## 2017-01-08 DIAGNOSIS — C9210 Chronic myeloid leukemia, BCR/ABL-positive, not having achieved remission: Secondary | ICD-10-CM

## 2017-01-08 NOTE — Telephone Encounter (Signed)
Spoke with patient and got her appts rescheduled for October.

## 2017-01-09 ENCOUNTER — Other Ambulatory Visit: Payer: Self-pay | Admitting: *Deleted

## 2017-01-09 ENCOUNTER — Telehealth: Payer: Self-pay | Admitting: *Deleted

## 2017-01-09 DIAGNOSIS — C9210 Chronic myeloid leukemia, BCR/ABL-positive, not having achieved remission: Secondary | ICD-10-CM

## 2017-01-09 MED ORDER — IMATINIB MESYLATE 100 MG PO TABS: ORAL_TABLET | ORAL | 3 refills | Status: DC

## 2017-01-09 NOTE — Telephone Encounter (Signed)
Pt states she missed 1 tablet, 5 days prior to labs. Has # 60 tablets left.  Dr Alvy Bimler wants her to take 300 mg daily.   Verbalized understanding

## 2017-02-12 ENCOUNTER — Other Ambulatory Visit: Payer: Medicare Other

## 2017-02-13 ENCOUNTER — Other Ambulatory Visit: Payer: Medicare Other

## 2017-02-16 ENCOUNTER — Other Ambulatory Visit: Payer: Self-pay | Admitting: Internal Medicine

## 2017-02-18 ENCOUNTER — Encounter (HOSPITAL_COMMUNITY): Payer: Self-pay

## 2017-02-18 ENCOUNTER — Ambulatory Visit (HOSPITAL_COMMUNITY)
Admission: RE | Admit: 2017-02-18 | Discharge: 2017-02-18 | Disposition: A | Discharge status: Home / Self Care | Payer: Medicare Other | Source: Ambulatory Visit | Attending: Hematology and Oncology | Admitting: Hematology and Oncology

## 2017-02-18 ENCOUNTER — Other Ambulatory Visit (HOSPITAL_BASED_OUTPATIENT_CLINIC_OR_DEPARTMENT_OTHER): Payer: Medicare Other

## 2017-02-18 DIAGNOSIS — R918 Other nonspecific abnormal finding of lung field: Secondary | ICD-10-CM | POA: Insufficient documentation

## 2017-02-18 DIAGNOSIS — C9210 Chronic myeloid leukemia, BCR/ABL-positive, not having achieved remission: Secondary | ICD-10-CM

## 2017-02-18 LAB — CBC WITH DIFFERENTIAL/PLATELET
BASO%: 0.6 % (ref 0.0–2.0)
BASOS ABS: 0 10*3/uL (ref 0.0–0.1)
EOS%: 2.5 % (ref 0.0–7.0)
Eosinophils Absolute: 0.1 10*3/uL (ref 0.0–0.5)
HCT: 34.6 % — ABNORMAL LOW (ref 34.8–46.6)
HEMOGLOBIN: 11.6 g/dL (ref 11.6–15.9)
LYMPH%: 42.1 % (ref 14.0–49.7)
MCH: 36.7 pg — AB (ref 25.1–34.0)
MCHC: 33.5 g/dL (ref 31.5–36.0)
MCV: 109.5 fL — ABNORMAL HIGH (ref 79.5–101.0)
MONO#: 0.5 10*3/uL (ref 0.1–0.9)
MONO%: 15 % — AB (ref 0.0–14.0)
NEUT#: 1.4 10*3/uL — ABNORMAL LOW (ref 1.5–6.5)
NEUT%: 39.8 % (ref 38.4–76.8)
Platelets: 136 10*3/uL — ABNORMAL LOW (ref 145–400)
RBC: 3.16 10*6/uL — ABNORMAL LOW (ref 3.70–5.45)
RDW: 14.3 % (ref 11.2–14.5)
WBC: 3.6 10*3/uL — ABNORMAL LOW (ref 3.9–10.3)
lymph#: 1.5 10*3/uL (ref 0.9–3.3)

## 2017-02-18 LAB — COMPREHENSIVE METABOLIC PANEL
ALBUMIN: 4.2 g/dL (ref 3.5–5.0)
ALT: 18 U/L (ref 0–55)
AST: 34 U/L (ref 5–34)
Alkaline Phosphatase: 53 U/L (ref 40–150)
Anion Gap: 9 mEq/L (ref 3–11)
BUN: 20.3 mg/dL (ref 7.0–26.0)
CHLORIDE: 102 meq/L (ref 98–109)
CO2: 29 mEq/L (ref 22–29)
Calcium: 9.4 mg/dL (ref 8.4–10.4)
Creatinine: 1.1 mg/dL (ref 0.6–1.1)
EGFR: 49 mL/min/{1.73_m2} — ABNORMAL LOW (ref >60)
GLUCOSE: 86 mg/dL (ref 70–140)
POTASSIUM: 4.4 meq/L (ref 3.5–5.1)
SODIUM: 140 meq/L (ref 136–145)
Total Bilirubin: 0.76 mg/dL (ref 0.20–1.20)
Total Protein: 6.6 g/dL (ref 6.4–8.3)

## 2017-02-19 ENCOUNTER — Ambulatory Visit: Payer: Medicare Other | Admitting: Hematology and Oncology

## 2017-02-25 ENCOUNTER — Telehealth: Payer: Self-pay | Admitting: Hematology and Oncology

## 2017-02-25 ENCOUNTER — Ambulatory Visit (HOSPITAL_BASED_OUTPATIENT_CLINIC_OR_DEPARTMENT_OTHER): Payer: Medicare Other | Admitting: Hematology and Oncology

## 2017-02-25 DIAGNOSIS — T451X5A Adverse effect of antineoplastic and immunosuppressive drugs, initial encounter: Secondary | ICD-10-CM

## 2017-02-25 DIAGNOSIS — D61810 Antineoplastic chemotherapy induced pancytopenia: Secondary | ICD-10-CM | POA: Diagnosis not present

## 2017-02-25 DIAGNOSIS — C9210 Chronic myeloid leukemia, BCR/ABL-positive, not having achieved remission: Secondary | ICD-10-CM | POA: Diagnosis not present

## 2017-02-25 DIAGNOSIS — R197 Diarrhea, unspecified: Secondary | ICD-10-CM | POA: Diagnosis not present

## 2017-02-25 NOTE — Telephone Encounter (Signed)
Gave patient avs report and appointments for January  °

## 2017-02-26 ENCOUNTER — Ambulatory Visit (INDEPENDENT_AMBULATORY_CARE_PROVIDER_SITE_OTHER): Payer: Medicare Other | Admitting: Internal Medicine

## 2017-02-26 ENCOUNTER — Other Ambulatory Visit (INDEPENDENT_AMBULATORY_CARE_PROVIDER_SITE_OTHER): Payer: Medicare Other

## 2017-02-26 ENCOUNTER — Encounter: Payer: Self-pay | Admitting: Internal Medicine

## 2017-02-26 ENCOUNTER — Encounter: Payer: Self-pay | Admitting: Hematology and Oncology

## 2017-02-26 VITALS — BP 124/72 | HR 67 | Temp 98.2°F | Resp 16 | Wt 125.0 lb

## 2017-02-26 DIAGNOSIS — Z0000 Encounter for general adult medical examination without abnormal findings: Secondary | ICD-10-CM

## 2017-02-26 DIAGNOSIS — E7849 Other hyperlipidemia: Secondary | ICD-10-CM

## 2017-02-26 DIAGNOSIS — R198 Other specified symptoms and signs involving the digestive system and abdomen: Secondary | ICD-10-CM

## 2017-02-26 DIAGNOSIS — E538 Deficiency of other specified B group vitamins: Secondary | ICD-10-CM | POA: Diagnosis not present

## 2017-02-26 LAB — LIPID PANEL
CHOL/HDL RATIO: 2
Cholesterol: 187 mg/dL (ref 0–200)
HDL: 83 mg/dL (ref >39.00)
LDL CALC: 92 mg/dL (ref 0–99)
NonHDL: 103.9
TRIGLYCERIDES: 59 mg/dL (ref 0.0–149.0)
VLDL: 11.8 mg/dL (ref 0.0–40.0)

## 2017-02-26 LAB — VITAMIN B12: Vitamin B-12: >1500 pg/mL — ABNORMAL HIGH (ref 211–911)

## 2017-02-26 LAB — TSH: TSH: 1.58 u[IU]/mL (ref 0.35–4.50)

## 2017-02-26 NOTE — Progress Notes (Signed)
[place] OFFICE PROGRESS NOTE  Patient Care Team: Binnie Rail, MD as PCP - General (Internal Medicine)  SUMMARY OF ONCOLOGIC HISTORY:   Chronic myeloid leukemia ([place])   02/18/2007 Initial Diagnosis    Chronic myeloid leukemia      08/20/2014 Tumor Marker    BCR/ABL is undetectable      03/04/2015 Pathology Results    BCR/ABL is undetectable      08/29/2015 Tumor Marker    BCR/ABL is undetectable      02/29/2016 Pathology Results    BCR/ABL is undetectable      05/01/2016 Pathology Results    BCR/ABL b2a2 is detected at 0.03% and IS ratio 0.069%      05/31/2016 Pathology Results    BCR/ABL b2a2 is detected at 0.12% and IS ratio 0.276%      06/19/2016 Pathology Results    BCR/ABL b2a2 is detected at 0.24% and IS ratio 0.552%      06/19/2016 Miscellaneous    ABL mutation kinase testing is negative      06/27/2016 Miscellaneous    Dose of Gleevec is increased to 600 mg daily      07/25/2016 Pathology Results    BCR/ABL b2a2 is detected at 0.03% and IS ratio 0.069%. She has achieved MMR      08/30/2016 Miscellaneous    Dose of Gleevec is reduced to 300 mg daily and subsequently down to 200 mg daily      10/22/2016 Pathology Results    BCR/ABL b2a2 is not detected. She has achieved MMR      01/03/2017 Pathology Results    BCR/ABL b2a2 is detected IS ratio 0.041%      02/18/2017 Pathology Results    BCR/ABL b2a2 is detected IS ratio 0.0126       INTERVAL HISTORY: Please see below for problem oriented charting. She returns for further follow-up with her husband She has intermittent diarrhea  no recent infection The patient denies any recent signs or symptoms of bleeding such as spontaneous epistaxis, hematuria or hematochezia. She had significant shoulder pain and might need surgery in the near future No recent cough.  We reviewed CT imaging which show previously noted lung nodule had disappeared  REVIEW OF SYSTEMS:   Constitutional: Denies  fevers, chills or abnormal weight loss Eyes: Denies blurriness of vision Ears, nose, mouth, throat, and face: Denies mucositis or sore throat Respiratory: Denies cough, dyspnea or wheezes Cardiovascular: Denies palpitation, chest discomfort or lower extremity swelling Skin: Denies abnormal skin rashes Lymphatics: Denies new lymphadenopathy or easy bruising Neurological:Denies numbness, tingling or new weaknesses Behavioral/Psych: Mood is stable, no new changes  All other systems were reviewed with the patient and are negative.  I have reviewed the past medical history, past surgical history, social history and family history with the patient and they are unchanged from previous note.  ALLERGIES:  is allergic to penicillins.  MEDICATIONS:  Current Outpatient Prescriptions  Medication Sig Dispense Refill  . aspirin 81 MG tablet Take 81 mg by mouth daily.    . Biotin 10000 MCG TABS Take 0.25 tablets by mouth daily.    . Calcium Carb-Cholecalciferol (CALCIUM 600 + D PO) Take 1 tablet by mouth daily.    . Cyanocobalamin (B-12) 5000 MCG CAPS Take 1 tablet by mouth daily.    Marland Kitchen estradiol (ESTRACE VAGINAL) 0.1 MG/GM vaginal cream Place 1 Applicatorful vaginally 2 (two) times a week. (Patient taking differently: Place 1 Applicatorful vaginally once a week. ) 42.5 g 5  .  FINACEA 15 % cream Apply 1 application topically daily.  2  . folic acid (FOLVITE) 1 MG tablet TAKE 1 TABLET(1 MG) BY MOUTH DAILY 30 tablet 0  . Glucosamine-Chondroit-Vit C-Mn (GLUCOSAMINE CHONDR 1500 COMPLX) CAPS Take 1 capsule by mouth daily.    Marland Kitchen ibuprofen (ADVIL,MOTRIN) 200 MG tablet Take 400 mg by mouth every 6 (six) hours as needed for moderate pain.     Marland Kitchen imatinib (GLEEVEC) 100 MG tablet Take 3 tablets (300 mg) once daily with meal and large glass of water.Caution:Chemotherapy 90 tablet 3  . Loperamide HCl (IMODIUM PO) Take 0.5-1 tablets by mouth 2 (two) times daily as needed (takes 0.5 tablet every morning and 2nd dose if  needed for diarrhea).     . Multiple Vitamin (MULTIVITAMIN) tablet Take 1 tablet by mouth daily.    . Probiotic Product (ALIGN) 4 MG CAPS Take 1 capsule by mouth daily.     No current facility-administered medications for this visit.     PHYSICAL EXAMINATION: ECOG PERFORMANCE STATUS: 1 - Symptomatic but completely ambulatory  Vitals:   02/25/17 0945  BP: 131/68  Pulse: 81  Resp: 18  Temp: 98.6 F (37 C)  SpO2: 100%   Filed Weights   02/25/17 0945  Weight: 125 lb 3.2 oz (56.8 kg)    GENERAL:alert, no distress and comfortable SKIN: skin color, texture, turgor are normal, no rashes or significant lesions EYES: normal, Conjunctiva are pink and non-injected, sclera clear OROPHARYNX:no exudate, no erythema and lips, buccal mucosa, and tongue normal  NECK: supple, thyroid normal size, non-tender, without nodularity LYMPH:  no palpable lymphadenopathy in the cervical, axillary or inguinal LUNGS: clear to auscultation and percussion with normal breathing effort HEART: regular rate & rhythm and no murmurs and no lower extremity edema ABDOMEN:abdomen soft, non-tender and normal bowel sounds Musculoskeletal:no cyanosis of digits and no clubbing  NEURO: alert & oriented x 3 with fluent speech, no focal motor/sensory deficits  LABORATORY DATA:  I have reviewed the data as listed    Component Value Date/Time   NA 140 02/18/2017 0808   K 4.4 02/18/2017 0808   CL 106 08/17/2015 0851   CL 105 09/04/2012 1006   CO2 29 02/18/2017 0808   GLUCOSE 86 02/18/2017 0808   GLUCOSE 96 09/04/2012 1006   BUN 20.3 02/18/2017 0808   CREATININE 1.1 02/18/2017 0808   CALCIUM 9.4 02/18/2017 0808   PROT 6.6 02/18/2017 0808   ALBUMIN 4.2 02/18/2017 0808   AST 34 02/18/2017 0808   ALT 18 02/18/2017 0808   ALKPHOS 53 02/18/2017 0808   BILITOT 0.76 02/18/2017 0808   GFRNONAA 58 (L) 10/24/2016 2042   GFRAA >60 10/24/2016 2042    No results found for: SPEP, UPEP  Lab Results  Component Value Date    WBC 3.6 (L) 02/18/2017   NEUTROABS 1.4 (L) 02/18/2017   HGB 11.6 02/18/2017   HCT 34.6 (L) 02/18/2017   MCV 109.5 (H) 02/18/2017   PLT 136 (L) 02/18/2017      Chemistry      Component Value Date/Time   NA 140 02/18/2017 0808   K 4.4 02/18/2017 0808   CL 106 08/17/2015 0851   CL 105 09/04/2012 1006   CO2 29 02/18/2017 0808   BUN 20.3 02/18/2017 0808   CREATININE 1.1 02/18/2017 0808      Component Value Date/Time   CALCIUM 9.4 02/18/2017 0808   ALKPHOS 53 02/18/2017 0808   AST 34 02/18/2017 0808   ALT 18 02/18/2017 7989  BILITOT 0.76 02/18/2017 0808       RADIOGRAPHIC STUDIES: I have personally reviewed the radiological images as listed and agreed with the findings in the report. Ct Chest Wo Contrast  Result Date: 02/18/2017 CLINICAL DATA:  Follow-up pulmonary nodule.  History of leukemia EXAM: CT CHEST WITHOUT CONTRAST TECHNIQUE: Multidetector CT imaging of the chest was performed following the standard protocol without IV contrast. COMPARISON:  PET-CT scan 09/25/2016 FINDINGS: Cardiovascular: No significant vascular findings. Normal heart size. No pericardial effusion. Mediastinum/Nodes: Lymphadenectomy clips in the RIGHT axilla. No axillary or supraclavicular adenopathy. No mediastinal adenopathy. Esophagus normal. No pericardial effusion Lungs/Pleura: Interval resolution of the RIGHT middle lobe nodule described on comparison PET CT. No new or suspicious pulmonary nodules. Upper Abdomen: Limited view of the liver, kidneys, pancreas are unremarkable. Normal adrenal glands. Musculoskeletal: No aggressive osseous lesion. IMPRESSION: 1. Interval resolution of the infectious or inflammatory nodule the RIGHT middle lobe. 2. No suspicious nodularity. Electronically Signed   By: Suzy Bouchard M.D.   On: 02/18/2017 09:31    ASSESSMENT & PLAN:  Chronic myeloid leukemia With higher dose Gleevec, she has achieved major molecular response. I recommend she continues treatment at  300 mg daily  I will see her back in 3 months for further follow-up  Pancytopenia due to antineoplastic chemotherapy [facility]) This is likely due to recent treatment. The patient denies recent history of bleeding such as epistaxis, hematuria or hematochezia. She is asymptomatic from the anemia & thrombocytopenia. I will observe for now.  Diarrhea This is due to side effects of Gleevec.  It is manageable conservatively.  We will observe for now Plan to continue at dose as above   No orders of the defined types were placed in this encounter.  All questions were answered. The patient knows to call the clinic with any problems, questions or concerns. No barriers to learning was detected. I spent 15 minutes counseling the patient face to face. The total time spent in the appointment was 20 minutes and more than 50% was on counseling and review of test results     Heath Lark, MD 02/26/2017 7:47 AM

## 2017-02-26 NOTE — Assessment & Plan Note (Signed)
She had a similar issue last year and advised increasing her fiber-she states she is no longer taking any fiber and is unsure why she stopped We will retry fiber

## 2017-02-26 NOTE — Assessment & Plan Note (Signed)
This is likely due to recent treatment. The patient denies recent history of bleeding such as epistaxis, hematuria or hematochezia. She is asymptomatic from the anemia & thrombocytopenia. I will observe for now.

## 2017-02-26 NOTE — Assessment & Plan Note (Signed)
This is due to side effects of Gleevec.  It is manageable conservatively.  We will observe for now Plan to continue at dose as above

## 2017-02-26 NOTE — Assessment & Plan Note (Signed)
Diet controlled.  Check lipid panel.

## 2017-02-26 NOTE — Patient Instructions (Addendum)
Test(s) ordered today. Your results will be released to [facility] (or called to you) after review, usually within 72hours after test completion. If any changes need to be made, you will be notified at that same time.  All other Health Maintenance issues reviewed.   All recommended immunizations and age-appropriate screenings are up-to-date or discussed.  No immunizations administered today.   Medications reviewed and updated.  No changes recommended at this time.   Please followup in one year   Health Maintenance, Female Adopting a healthy lifestyle and getting preventive care can go a long way to promote health and wellness. Talk with your health care provider about what schedule of regular examinations is right for you. This is a good chance for you to check in with your provider about disease prevention and staying healthy. In between checkups, there are plenty of things you can do on your own. Experts have done a lot of research about which lifestyle changes and preventive measures are most likely to keep you healthy. Ask your health care provider for more information. Weight and diet Eat a healthy diet  Be sure to include plenty of vegetables, fruits, low-fat dairy products, and lean protein.  Do not eat a lot of foods high in solid fats, added sugars, or salt.  Get regular exercise. This is one of the most important things you can do for your health. ? Most adults should exercise for at least 150 minutes each week. The exercise should increase your heart rate and make you sweat (moderate-intensity exercise). ? Most adults should also do strengthening exercises at least twice a week. This is in addition to the moderate-intensity exercise.  Maintain a healthy weight  Body mass index (BMI) is a measurement that can be used to identify possible weight problems. It estimates body fat based on height and weight. Your health care provider can help determine your BMI and help you achieve or  maintain a healthy weight.  For females 64 years of age and older: ? A BMI below 18.5 is considered underweight. ? A BMI of 18.5 to 24.9 is normal. ? A BMI of 25 to 29.9 is considered overweight. ? A BMI of 30 and above is considered obese.  Watch levels of cholesterol and blood lipids  You should start having your blood tested for lipids and cholesterol at 80 years of age, then have this test every 5 years.  You may need to have your cholesterol levels checked more often if: ? Your lipid or cholesterol levels are high. ? You are older than 80 years of age. ? You are at high risk for heart disease.  Cancer screening Lung Cancer  Lung cancer screening is recommended for adults 72-7 years old who are at high risk for lung cancer because of a history of smoking.  A yearly low-dose CT scan of the lungs is recommended for people who: ? Currently smoke. ? Have quit within the past 15 years. ? Have at least a 30-pack-year history of smoking. A pack year is smoking an average of one pack of cigarettes a day for 1 year.  Yearly screening should continue until it has been 15 years since you quit.  Yearly screening should stop if you develop a health problem that would prevent you from having lung cancer treatment.  Breast Cancer  Practice breast self-awareness. This means understanding how your breasts normally appear and feel.  It also means doing regular breast self-exams. Let your health care provider know about any changes,  no matter how small.  If you are in your 20s or 30s, you should have a clinical breast exam (CBE) by a health care provider every 1-3 years as part of a regular health exam.  If you are 40 or older, have a CBE every year. Also consider having a breast X-ray (mammogram) every year.  If you have a family history of breast cancer, talk to your health care provider about genetic screening.  If you are at high risk for breast cancer, talk to your health care  provider about having an MRI and a mammogram every year.  Breast cancer gene (BRCA) assessment is recommended for women who have family members with BRCA-related cancers. BRCA-related cancers include: ? Breast. ? Ovarian. ? Tubal. ? Peritoneal cancers.  Results of the assessment will determine the need for genetic counseling and BRCA1 and BRCA2 testing.  Cervical Cancer Your health care provider may recommend that you be screened regularly for cancer of the pelvic organs (ovaries, uterus, and vagina). This screening involves a pelvic examination, including checking for microscopic changes to the surface of your cervix (Pap test). You may be encouraged to have this screening done every 3 years, beginning at age 21.  For women ages 30-65, health care providers may recommend pelvic exams and Pap testing every 3 years, or they may recommend the Pap and pelvic exam, combined with testing for human papilloma virus (HPV), every 5 years. Some types of HPV increase your risk of cervical cancer. Testing for HPV may also be done on women of any age with unclear Pap test results.  Other health care providers may not recommend any screening for nonpregnant women who are considered low risk for pelvic cancer and who do not have symptoms. Ask your health care provider if a screening pelvic exam is right for you.  If you have had past treatment for cervical cancer or a condition that could lead to cancer, you need Pap tests and screening for cancer for at least 20 years after your treatment. If Pap tests have been discontinued, your risk factors (such as having a new sexual partner) need to be reassessed to determine if screening should resume. Some women have medical problems that increase the chance of getting cervical cancer. In these cases, your health care provider may recommend more frequent screening and Pap tests.  Colorectal Cancer  This type of cancer can be detected and often prevented.  Routine  colorectal cancer screening usually begins at 80 years of age and continues through 80 years of age.  Your health care provider may recommend screening at an earlier age if you have risk factors for colon cancer.  Your health care provider may also recommend using home test kits to check for hidden blood in the stool.  A small camera at the end of a tube can be used to examine your colon directly (sigmoidoscopy or colonoscopy). This is done to check for the earliest forms of colorectal cancer.  Routine screening usually begins at age 50.  Direct examination of the colon should be repeated every 5-10 years through 80 years of age. However, you may need to be screened more often if early forms of precancerous polyps or small growths are found.  Skin Cancer  Check your skin from head to toe regularly.  Tell your health care provider about any new moles or changes in moles, especially if there is a change in a mole's shape or color.  Also tell your health care provider if you   have a mole that is larger than the size of a pencil eraser.  Always use sunscreen. Apply sunscreen liberally and repeatedly throughout the day.  Protect yourself by wearing long sleeves, pants, a wide-brimmed hat, and sunglasses whenever you are outside.  Heart disease, diabetes, and high blood pressure  High blood pressure causes heart disease and increases the risk of stroke. High blood pressure is more likely to develop in: ? People who have blood pressure in the high end of the normal range (130-139/85-89 mm Hg). ? People who are overweight or obese. ? People who are African American.  If you are 24-25 years of age, have your blood pressure checked every 3-5 years. If you are 2 years of age or older, have your blood pressure checked every year. You should have your blood pressure measured twice-once when you are at a hospital or clinic, and once when you are not at a hospital or clinic. Record the average of the  two measurements. To check your blood pressure when you are not at a hospital or clinic, you can use: ? An automated blood pressure machine at a pharmacy. ? A home blood pressure monitor.  If you are between 42 years and 59 years old, ask your health care provider if you should take aspirin to prevent strokes.  Have regular diabetes screenings. This involves taking a blood sample to check your fasting blood sugar level. ? If you are at a normal weight and have a low risk for diabetes, have this test once every three years after 80 years of age. ? If you are overweight and have a high risk for diabetes, consider being tested at a younger age or more often. Preventing infection Hepatitis B  If you have a higher risk for hepatitis B, you should be screened for this virus. You are considered at high risk for hepatitis B if: ? You were born in a country where hepatitis B is common. Ask your health care provider which countries are considered high risk. ? Your parents were born in a high-risk country, and you have not been immunized against hepatitis B (hepatitis B vaccine). ? You have HIV or AIDS. ? You use needles to inject street drugs. ? You live with someone who has hepatitis B. ? You have had sex with someone who has hepatitis B. ? You get hemodialysis treatment. ? You take certain medicines for conditions, including cancer, organ transplantation, and autoimmune conditions.  Hepatitis C  Blood testing is recommended for: ? Everyone born from [birth year] through 1963. ? Anyone with known risk factors for hepatitis C.  Sexually transmitted infections (STIs)  You should be screened for sexually transmitted infections (STIs) including gonorrhea and chlamydia if: ? You are sexually active and are younger than 79 years of age. ? You are older than 80 years of age and your health care provider tells you that you are at risk for this type of infection. ? Your sexual activity has changed since you  were last screened and you are at an increased risk for chlamydia or gonorrhea. Ask your health care provider if you are at risk.  If you do not have HIV, but are at risk, it may be recommended that you take a prescription medicine daily to prevent HIV infection. This is called pre-exposure prophylaxis (PrEP). You are considered at risk if: ? You are sexually active and do not regularly use condoms or know the HIV status of your partner(s). ? You take drugs by injection. ?  You are sexually active with a partner who has HIV.  Talk with your health care provider about whether you are at high risk of being infected with HIV. If you choose to begin PrEP, you should first be tested for HIV. You should then be tested every 3 months for as long as you are taking PrEP. Pregnancy  If you are premenopausal and you may become pregnant, ask your health care provider about preconception counseling.  If you may become pregnant, take 400 to 800 micrograms (mcg) of folic acid every day.  If you want to prevent pregnancy, talk to your health care provider about birth control (contraception). Osteoporosis and menopause  Osteoporosis is a disease in which the bones lose minerals and strength with aging. This can result in serious bone fractures. Your risk for osteoporosis can be identified using a bone density scan.  If you are 65 years of age or older, or if you are at risk for osteoporosis and fractures, ask your health care provider if you should be screened.  Ask your health care provider whether you should take a calcium or vitamin D supplement to lower your risk for osteoporosis.  Menopause may have certain physical symptoms and risks.  Hormone replacement therapy may reduce some of these symptoms and risks. Talk to your health care provider about whether hormone replacement therapy is right for you. Follow these instructions at home:  Schedule regular health, dental, and eye exams.  Stay current  with your immunizations.  Do not use any tobacco products including cigarettes, chewing tobacco, or electronic cigarettes.  If you are pregnant, do not drink alcohol.  If you are breastfeeding, limit how much and how often you drink alcohol.  Limit alcohol intake to no more than 1 drink per day for nonpregnant women. One drink equals 12 ounces of beer, 5 ounces of wine, or 1 ounces of hard liquor.  Do not use street drugs.  Do not share needles.  Ask your health care provider for help if you need support or information about quitting drugs.  Tell your health care provider if you often feel depressed.  Tell your health care provider if you have ever been abused or do not feel safe at home. This information is not intended to replace advice given to you by your health care provider. Make sure you discuss any questions you have with your health care provider. Document Released: 10/30/2010 Document Revised: 09/22/2015 Document Reviewed: 01/18/2015 Elsevier Interactive Patient Education  2018 [employer]  

## 2017-02-26 NOTE — Assessment & Plan Note (Signed)
With higher dose Gleevec, she has achieved major molecular response. I recommend she continues treatment at 300 mg daily  I will see her back in 3 months for further follow-up

## 2017-02-26 NOTE — Assessment & Plan Note (Signed)
Taking B12 daily Will check level 

## 2017-02-26 NOTE — Progress Notes (Signed)
Subjective:    Patient ID: [name] [name], female    DOB: [birth year], 82 y.o.   MRN: [id number]  HPI She is here for a physical exam.   She will be having a total shoulder replacement on the right in the near future.  It does limit her daily ADLs.    She feels like her anal sphincter is slightly weaker.  Her stools are softer and occasionally she has diarrhea.  She often has to have more than one bowel movement within a short time period.  She denies true fecal incontinence.  She thinks some of this may be side effects from 1 of her medications.  She has no concerns and overall she feels pretty well.  Medications and allergies reviewed with patient and updated if appropriate.  Patient Active Problem List   Diagnosis Date Noted  . Greater trochanteric bursitis of left hip 10/24/2016  . Bone lesion 09/18/2016  . Skin lesion of hand 04/11/2016  . Scalp itch 04/11/2016  . Urinary incontinence 04/11/2016  . Change in bowel function 04/11/2016  . Sinus pressure 02/17/2016  . Vitamin D deficiency 10/02/2015  . Other fatigue 04/07/2015  . History of colonic polyps 08/06/2014  . Thrombocytopenia due to drugs 02/18/2014  . Leukopenia due to antineoplastic chemotherapy ([place]) 02/18/2014  . 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 Other abnormal glucose 07/30/2012  . History of breast cancer 07/18/2012  . Osteopenia 09/15/2011  . Diarrhea 08/29/2011  . Family history of malignant neoplasm of gastrointestinal tract 08/29/2011  . HYPERLIPIDEMIA 04/14/2009  . Pancytopenia due to antineoplastic chemotherapy ([place]) 04/14/2009  . VISUAL IMPAIRMENT, TRANSIENT 04/14/2009  . RECTOVAGINAL FISTULA 11/10/2008  . DIVERTICULOSIS OF COLON 04/06/2008  . Chronic myeloid leukemia ([place]) 02/18/2007  . ROSACEA 02/18/2007    Current Outpatient Prescriptions on File Prior to Visit  Medication Sig Dispense Refill  . aspirin 81 MG tablet Take 81 mg by mouth daily.    . Biotin 10000 MCG TABS Take 0.25 tablets by  mouth daily.    . Calcium Carb-Cholecalciferol (CALCIUM 600 + D PO) Take 1 tablet by mouth daily.    . Cyanocobalamin (B-12) 5000 MCG CAPS Take 1 tablet by mouth daily.    Marland Kitchen estradiol (ESTRACE VAGINAL) 0.1 MG/GM vaginal cream Place 1 Applicatorful vaginally 2 (two) times a week. (Patient taking differently: Place 1 Applicatorful vaginally once a week. ) 42.5 g 5  . FINACEA 15 % cream Apply 1 application topically daily.  2  . folic acid (FOLVITE) 1 MG tablet TAKE 1 TABLET(1 MG) BY MOUTH DAILY 30 tablet 0  . Glucosamine-Chondroit-Vit C-Mn (GLUCOSAMINE CHONDR 1500 COMPLX) CAPS Take 1 capsule by mouth daily.    Marland Kitchen ibuprofen (ADVIL,MOTRIN) 200 MG tablet Take 400 mg by mouth every 6 (six) hours as needed for moderate pain.     Marland Kitchen imatinib (GLEEVEC) 100 MG tablet Take 3 tablets (300 mg) once daily with meal and large glass of water.Caution:Chemotherapy 90 tablet 3  . Loperamide HCl (IMODIUM PO) Take 0.5-1 tablets by mouth 2 (two) times daily as needed (takes 0.5 tablet every morning and 2nd dose if needed for diarrhea).     . Multiple Vitamin (MULTIVITAMIN) tablet Take 1 tablet by mouth daily.    . Probiotic Product (ALIGN) 4 MG CAPS Take 1 capsule by mouth daily.     No current facility-administered medications on file prior to visit.     Past Medical History:  Diagnosis Date  . Anemia requiring transfusions 2010 & 2015  .  Arthritis    DJD  . Breast cancer ([place]) 1993   S/P lumpectomy , radiation, & Tamoxifen  . CML (chronic myeloid leukemia) ([facility])    Dr Alvy Bimler    Past Surgical History:  Procedure Laterality Date  . ABDOMINAL HYSTERECTOMY  ~1989   no BSO, for Dysplasia   . APPENDECTOMY  ~1989  . BILATERAL SALPINGOOPHORECTOMY  2010   with bowel resection  . BREAST SURGERY     Right lumpectomy, s/p radiation 1993 and oral  Tamoxifen x 5 years ([id number])   . CATARACT EXTRACTION  06/08/2014   left eye;Dr Herbert Deaner  . COLONOSCOPY  03/2003, last 2010   diverticulosis, Dr.Kaplan  . EXCISION  MORTON'S NEUROMA Left mid 80's   foot  . gastrocslide     left leg; Dr Beola Cord  . ileostomy reversal  2011   3 mos after above  . KNEE SURGERY  mid 80's   Arthroscopy, right knee   . LEG TENDON SURGERY Left 2013   "cut on leg and fixed something"  . OPEN SURGICAL REPAIR OF GLUTEAL TENDON Left 10/24/2016   Procedure: Left hip bursectomy; gluteal tendon repair;  Surgeon: Gaynelle Arabian, MD;  Location: [facility];  Service: Orthopedics;  Laterality: Left;  . sigmoid colon resection  2010    Dr Arlyce Dice, [place] for fistula & diverticulitis  . TOTAL KNEE ARTHROPLASTY Right 06/11/2013   Procedure: RIGHT TOTAL KNEE ARTHROPLASTY;  Surgeon: Sydnee Cabal, MD;  Location: [facility];  Service: Orthopedics;  Laterality: Right;  . TRIGGER FINGER RELEASE Left 2013   thumb    Social History   Social History  . Marital status: Married    Spouse name: N/A  . Number of children: N/A  . Years of education: N/A   Social History Main Topics  . Smoking status: Former Smoker    Years: 5.00    Types: Cigarettes    Quit date: 04/30/1962  . Smokeless tobacco: Never Used     Comment: smoked 1956-1964, up to 1/2 ppd  . Alcohol use 4.2 - 8.4 oz/week    7 - 14 Glasses of wine per week  . Drug use: No  . Sexual activity: Yes   Other Topics Concern  . Not on file   Social History Narrative  . No narrative on file    Family History  Problem Relation Age of Onset  . Colon cancer Mother 54  . Uterine cancer Sister   . Diabetes Cousin   . Cancer Father        Lip cancer  . Aneurysm Father        AAA;S/P surgery  . Breast cancer Paternal Aunt   . Heart attack Paternal Uncle 65  . Stroke Neg Hx   . Esophageal cancer Neg Hx   . Rectal cancer Neg Hx   . Stomach cancer Neg Hx     Review of Systems  Constitutional: Negative for appetite change, chills, fatigue, fever and unexpected weight change.  Eyes: Negative for visual disturbance.  Respiratory: Negative for cough, shortness of breath and wheezing.    Cardiovascular: Negative for chest pain, palpitations and leg swelling.  Gastrointestinal: Positive for diarrhea (occ, has soft stool). Negative for abdominal pain, blood in stool and nausea.       No gerd  Genitourinary: Negative for dysuria and hematuria.  Musculoskeletal: Positive for arthralgias.  Skin: Negative for color change and rash.  Neurological: Negative for dizziness, light-headedness and headaches.  Psychiatric/Behavioral: Negative for dysphoric mood. The patient is not  nervous/anxious.        Objective:   Vitals:   02/26/17 0918  BP: 124/72  Pulse: 67  Resp: 16  Temp: 98.2 F (36.8 C)  SpO2: 98%   Filed Weights   02/26/17 0918  Weight: 125 lb (56.7 kg)   Body mass index is 23.62 kg/m.  Wt Readings from Last 3 Encounters:  02/26/17 125 lb (56.7 kg)  02/25/17 125 lb 3.2 oz (56.8 kg)  10/24/16 122 lb (55.3 kg)     Physical Exam Constitutional: She appears well-developed and well-nourished. No distress.  HENT:  Head: Normocephalic and atraumatic.  Right Ear: External ear normal. Normal ear canal and TM Left Ear: External ear normal.  Normal ear canal and TM Mouth/Throat: Oropharynx is clear and moist.  Eyes: Conjunctivae and EOM are normal.  Neck: Neck supple. No tracheal deviation present. No thyromegaly present.  No carotid bruit  Cardiovascular: Normal rate, regular rhythm and normal heart sounds.   No murmur heard.  No edema. Pulmonary/Chest: Effort normal and breath sounds normal. No respiratory distress. She has no wheezes. She has no rales.  Breast: deferred to Gyn Abdominal: Soft. She exhibits no distension. There is no tenderness.  Lymphadenopathy: She has no cervical adenopathy.  Skin: Skin is warm and dry. She is not diaphoretic.  Psychiatric: She has a normal mood and affect. Her behavior is normal.        Assessment & Plan:   Physical exam: Screening blood work   ordered Immunizations  Td due, discussed shingles Dillon Bjork will ask  oncology Colonoscopy    no longer needed Mammogram  No longer needed Gyn - no longer sees gyn Dexa  Up to date, last done 2017 Eye exams  Up to date  EKG  last done 2014 Exercise  - doing some walking Weight   Normal   BMI Skin   Sees derm annually Substance abuse   none  See Problem List for Assessment and Plan of chronic medical problems.  Follow-up yearly

## 2017-03-01 LAB — IMATINIB RESISTANCE IN CML (GENPATH)

## 2017-03-04 ENCOUNTER — Other Ambulatory Visit: Payer: Medicare Other

## 2017-03-11 ENCOUNTER — Ambulatory Visit: Payer: Medicare Other | Admitting: Hematology and Oncology

## 2017-03-11 ENCOUNTER — Other Ambulatory Visit: Payer: Self-pay | Admitting: Internal Medicine

## 2017-04-22 ENCOUNTER — Telehealth: Payer: Self-pay | Admitting: Internal Medicine

## 2017-04-22 NOTE — Telephone Encounter (Signed)
Just FYI for documentation of call from patient to [facility] 04/21/17. Pt is in the [place] area and experiencing back pain, thinks it is muscular and was requesting her PCP call in meds. Triage nurse advised pt to got to ED or Urgent Care for treatment.

## 2017-04-22 NOTE — Telephone Encounter (Signed)
Tried to contact patient, number listed as home does not have a VM and the number listed for work is disconnected. No other contact information in chart.

## 2017-04-22 NOTE — Telephone Encounter (Signed)
She really should be seen to make sure it is muscular - agree with urgent care evaluation

## 2017-04-29 ENCOUNTER — Other Ambulatory Visit: Payer: Self-pay | Admitting: Hematology and Oncology

## 2017-04-29 DIAGNOSIS — C9210 Chronic myeloid leukemia, BCR/ABL-positive, not having achieved remission: Secondary | ICD-10-CM

## 2017-04-30 HISTORY — PX: TENDON REPAIR: SHX5111

## 2017-05-21 ENCOUNTER — Inpatient Hospital Stay: Payer: Medicare Other | Attending: Hematology and Oncology

## 2017-05-21 DIAGNOSIS — Z79899 Other long term (current) drug therapy: Secondary | ICD-10-CM | POA: Insufficient documentation

## 2017-05-21 DIAGNOSIS — C9210 Chronic myeloid leukemia, BCR/ABL-positive, not having achieved remission: Secondary | ICD-10-CM | POA: Diagnosis not present

## 2017-05-21 DIAGNOSIS — T451X5S Adverse effect of antineoplastic and immunosuppressive drugs, sequela: Secondary | ICD-10-CM | POA: Insufficient documentation

## 2017-05-21 DIAGNOSIS — D61810 Antineoplastic chemotherapy induced pancytopenia: Secondary | ICD-10-CM | POA: Insufficient documentation

## 2017-05-21 LAB — COMPREHENSIVE METABOLIC PANEL
ALBUMIN: 4 g/dL (ref 3.5–5.0)
ALT: 17 U/L (ref 0–55)
ANION GAP: 7 (ref 3–11)
AST: 31 U/L (ref 5–34)
Alkaline Phosphatase: 56 U/L (ref 40–150)
BUN: 21 mg/dL (ref 7–26)
CHLORIDE: 103 mmol/L (ref 98–109)
CO2: 29 mmol/L (ref 22–29)
Calcium: 9.3 mg/dL (ref 8.4–10.4)
Creatinine, Ser: 0.97 mg/dL (ref 0.60–1.10)
GFR calc Af Amer: >60 mL/min (ref >60)
GFR calc non Af Amer: 54 mL/min — ABNORMAL LOW (ref >60)
Glucose, Bld: 96 mg/dL (ref 70–140)
POTASSIUM: 4.5 mmol/L (ref 3.3–4.7)
Sodium: 139 mmol/L (ref 136–145)
Total Bilirubin: 0.7 mg/dL (ref 0.2–1.2)
Total Protein: 6.8 g/dL (ref 6.4–8.3)

## 2017-05-21 LAB — CBC WITH DIFFERENTIAL/PLATELET
Basophils Absolute: 0 10*3/uL (ref 0.0–0.1)
Basophils Relative: 1 %
EOS PCT: 2 %
Eosinophils Absolute: 0.1 10*3/uL (ref 0.0–0.5)
HEMATOCRIT: 30.8 % — AB (ref 34.8–46.6)
HEMOGLOBIN: 10.4 g/dL — AB (ref 11.6–15.9)
LYMPHS ABS: 0.8 10*3/uL — AB (ref 0.9–3.3)
LYMPHS PCT: 23 %
MCH: 38.9 pg — AB (ref 25.1–34.0)
MCHC: 33.9 g/dL (ref 31.5–36.0)
MCV: 114.8 fL — AB (ref 79.5–101.0)
Monocytes Absolute: 0.5 10*3/uL (ref 0.1–0.9)
Monocytes Relative: 14 %
NEUTROS ABS: 2 10*3/uL (ref 1.5–6.5)
Neutrophils Relative %: 60 %
PLATELETS: 130 10*3/uL — AB (ref 145–400)
RBC: 2.68 MIL/uL — ABNORMAL LOW (ref 3.70–5.45)
RDW: 13.6 % (ref 11.2–16.1)
WBC: 3.4 10*3/uL — ABNORMAL LOW (ref 3.9–10.3)

## 2017-05-28 ENCOUNTER — Telehealth: Payer: Self-pay | Admitting: Hematology and Oncology

## 2017-05-28 ENCOUNTER — Inpatient Hospital Stay (HOSPITAL_BASED_OUTPATIENT_CLINIC_OR_DEPARTMENT_OTHER): Payer: Medicare Other | Admitting: Hematology and Oncology

## 2017-05-28 ENCOUNTER — Encounter: Payer: Self-pay | Admitting: Hematology and Oncology

## 2017-05-28 DIAGNOSIS — C9210 Chronic myeloid leukemia, BCR/ABL-positive, not having achieved remission: Secondary | ICD-10-CM | POA: Diagnosis not present

## 2017-05-28 DIAGNOSIS — D61810 Antineoplastic chemotherapy induced pancytopenia: Secondary | ICD-10-CM | POA: Diagnosis not present

## 2017-05-28 DIAGNOSIS — Z79899 Other long term (current) drug therapy: Secondary | ICD-10-CM

## 2017-05-28 DIAGNOSIS — T451X5S Adverse effect of antineoplastic and immunosuppressive drugs, sequela: Secondary | ICD-10-CM | POA: Diagnosis not present

## 2017-05-28 DIAGNOSIS — T451X5A Adverse effect of antineoplastic and immunosuppressive drugs, initial encounter: Secondary | ICD-10-CM

## 2017-05-28 NOTE — Assessment & Plan Note (Signed)
This is likely due to recent treatment. The patient denies recent history of bleeding such as epistaxis, hematuria or hematochezia. She is asymptomatic from the anemia & thrombocytopenia. I will observe for now.

## 2017-05-28 NOTE — Assessment & Plan Note (Signed)
Her most recent blood work show undetectable BCR/ABL I recommend she continues treatment at 300 mg daily  I will see her back in 3 months for further follow-up

## 2017-05-28 NOTE — Progress Notes (Signed)
[name] OFFICE PROGRESS NOTE  Patient Care Team: Binnie Rail, MD as PCP - General (Internal Medicine)  SUMMARY OF ONCOLOGIC HISTORY:   Chronic myeloid leukemia ([place])   02/18/2007 Initial Diagnosis    Chronic myeloid leukemia      08/20/2014 Tumor Marker    BCR/ABL is undetectable      03/04/2015 Pathology Results    BCR/ABL is undetectable      08/29/2015 Tumor Marker    BCR/ABL is undetectable      02/29/2016 Pathology Results    BCR/ABL is undetectable      05/01/2016 Pathology Results    BCR/ABL b2a2 is detected at 0.03% and IS ratio 0.069%      05/31/2016 Pathology Results    BCR/ABL b2a2 is detected at 0.12% and IS ratio 0.276%      06/19/2016 Pathology Results    BCR/ABL b2a2 is detected at 0.24% and IS ratio 0.552%      06/19/2016 Miscellaneous    ABL mutation kinase testing is negative      06/27/2016 Miscellaneous    Dose of Gleevec is increased to 600 mg daily      07/25/2016 Pathology Results    BCR/ABL b2a2 is detected at 0.03% and IS ratio 0.069%. She has achieved MMR      08/30/2016 Miscellaneous    Dose of Gleevec is reduced to 300 mg daily and subsequently down to 200 mg daily      10/22/2016 Pathology Results    BCR/ABL b2a2 is not detected. She has achieved MMR      01/03/2017 Pathology Results    BCR/ABL b2a2 is detected IS ratio 0.041%      02/18/2017 Pathology Results    BCR/ABL b2a2 is detected IS ratio 0.0126      05/21/2017 Pathology Results    BCR/ABL b2a2 is not detected. She has achieved MMR       INTERVAL HISTORY: Please see below for problem oriented charting. She returns for further follow-up She is tolerating 300 mg of Gleevec well Denies any side effects No recent infection Her energy level is fair  REVIEW OF SYSTEMS:   Constitutional: Denies fevers, chills or abnormal weight loss Eyes: Denies blurriness of vision Ears, nose, mouth, throat, and face: Denies mucositis or sore throat Respiratory:  Denies cough, dyspnea or wheezes Cardiovascular: Denies palpitation, chest discomfort or lower extremity swelling Gastrointestinal:  Denies nausea, heartburn or change in bowel habits Skin: Denies abnormal skin rashes Lymphatics: Denies new lymphadenopathy or easy bruising Neurological:Denies numbness, tingling or new weaknesses Behavioral/Psych: Mood is stable, no new changes  All other systems were reviewed with the patient and are negative.  I have reviewed the past medical history, past surgical history, social history and family history with the patient and they are unchanged from previous note.  ALLERGIES:  is allergic to penicillins.  MEDICATIONS:  Current Outpatient Medications  Medication Sig Dispense Refill  . aspirin 81 MG tablet Take 81 mg by mouth daily.    . Biotin 10000 MCG TABS Take 0.25 tablets by mouth daily.    . Calcium Carb-Cholecalciferol (CALCIUM 600 + D PO) Take 1 tablet by mouth daily.    . Cyanocobalamin (B-12) 5000 MCG CAPS Take 1 tablet by mouth daily.    Marland Kitchen estradiol (ESTRACE VAGINAL) 0.1 MG/GM vaginal cream Place 1 Applicatorful vaginally 2 (two) times a week. (Patient taking differently: Place 1 Applicatorful vaginally once a week. ) 42.5 g 5  . FINACEA 15 % cream Apply  1 application topically daily.  2  . folic acid (FOLVITE) 1 MG tablet TAKE 1 TABLET(1 MG) BY MOUTH DAILY 90 tablet 3  . Glucosamine-Chondroit-Vit C-Mn (GLUCOSAMINE CHONDR 1500 COMPLX) CAPS Take 1 capsule by mouth daily.    Marland Kitchen ibuprofen (ADVIL,MOTRIN) 200 MG tablet Take 400 mg by mouth every 6 (six) hours as needed for moderate pain.     Marland Kitchen imatinib (GLEEVEC) 100 MG tablet TAKE 3 TABLETS BY MOUTH EVERY DAY WITH MEAL AND LARGE GLASS OF WATER 270 tablet 0  . Loperamide HCl (IMODIUM PO) Take 0.5-1 tablets by mouth 2 (two) times daily as needed (takes 0.5 tablet every morning and 2nd dose if needed for diarrhea).     . Multiple Vitamin (MULTIVITAMIN) tablet Take 1 tablet by mouth daily.    .  Probiotic Product (ALIGN) 4 MG CAPS Take 1 capsule by mouth daily.     No current facility-administered medications for this visit.     PHYSICAL EXAMINATION: ECOG PERFORMANCE STATUS: 0 - Asymptomatic  Vitals:   05/28/17 1033  BP: 124/62  Pulse: 86  Resp: 18  Temp: 97.7 F (36.5 C)  SpO2: 100%   Filed Weights   05/28/17 1033  Weight: 124 lb 14.4 oz (56.7 kg)    GENERAL:alert, no distress and comfortable SKIN: skin color, texture, turgor are normal, no rashes or significant lesions EYES: normal, Conjunctiva are pink and non-injected, sclera clear OROPHARYNX:no exudate, no erythema and lips, buccal mucosa, and tongue normal  NECK: supple, thyroid normal size, non-tender, without nodularity LYMPH:  no palpable lymphadenopathy in the cervical, axillary or inguinal LUNGS: clear to auscultation and percussion with normal breathing effort HEART: regular rate & rhythm and no murmurs and no lower extremity edema ABDOMEN:abdomen soft, non-tender and normal bowel sounds Musculoskeletal:no cyanosis of digits and no clubbing  NEURO: alert & oriented x 3 with fluent speech, no focal motor/sensory deficits  LABORATORY DATA:  I have reviewed the data as listed    Component Value Date/Time   NA 139 05/21/2017 1113   NA 140 02/18/2017 0808   K 4.5 05/21/2017 1113   K 4.4 02/18/2017 0808   CL 103 05/21/2017 1113   CL 105 09/04/2012 1006   CO2 29 05/21/2017 1113   CO2 29 02/18/2017 0808   GLUCOSE 96 05/21/2017 1113   GLUCOSE 86 02/18/2017 0808   GLUCOSE 96 09/04/2012 1006   BUN 21 05/21/2017 1113   BUN 20.3 02/18/2017 0808   CREATININE 0.97 05/21/2017 1113   CREATININE 1.1 02/18/2017 0808   CALCIUM 9.3 05/21/2017 1113   CALCIUM 9.4 02/18/2017 0808   PROT 6.8 05/21/2017 1113   PROT 6.6 02/18/2017 0808   ALBUMIN 4.0 05/21/2017 1113   ALBUMIN 4.2 02/18/2017 0808   AST 31 05/21/2017 1113   AST 34 02/18/2017 0808   ALT 17 05/21/2017 1113   ALT 18 02/18/2017 0808   ALKPHOS 56  05/21/2017 1113   ALKPHOS 53 02/18/2017 0808   BILITOT 0.7 05/21/2017 1113   BILITOT 0.76 02/18/2017 0808   GFRNONAA 54 (L) 05/21/2017 1113   GFRAA >60 05/21/2017 1113    No results found for: SPEP, UPEP  Lab Results  Component Value Date   WBC 3.4 (L) 05/21/2017   NEUTROABS 2.0 05/21/2017   HGB 10.4 (L) 05/21/2017   HCT 30.8 (L) 05/21/2017   MCV 114.8 (H) 05/21/2017   PLT 130 (L) 05/21/2017      Chemistry      Component Value Date/Time   NA 139 05/21/2017 1113  NA 140 02/18/2017 0808   K 4.5 05/21/2017 1113   K 4.4 02/18/2017 0808   CL 103 05/21/2017 1113   CL 105 09/04/2012 1006   CO2 29 05/21/2017 1113   CO2 29 02/18/2017 0808   BUN 21 05/21/2017 1113   BUN 20.3 02/18/2017 0808   CREATININE 0.97 05/21/2017 1113   CREATININE 1.1 02/18/2017 0808      Component Value Date/Time   CALCIUM 9.3 05/21/2017 1113   CALCIUM 9.4 02/18/2017 0808   ALKPHOS 56 05/21/2017 1113   ALKPHOS 53 02/18/2017 0808   AST 31 05/21/2017 1113   AST 34 02/18/2017 0808   ALT 17 05/21/2017 1113   ALT 18 02/18/2017 0808   BILITOT 0.7 05/21/2017 1113   BILITOT 0.76 02/18/2017 0808      ASSESSMENT & PLAN:  Chronic myeloid leukemia Her most recent blood work show undetectable BCR/ABL I recommend she continues treatment at 300 mg daily  I will see her back in 3 months for further follow-up  Pancytopenia due to antineoplastic chemotherapy [name]) This is likely due to recent treatment. The patient denies recent history of bleeding such as epistaxis, hematuria or hematochezia. She is asymptomatic from the anemia & thrombocytopenia. I will observe for now.   No orders of the defined types were placed in this encounter.  All questions were answered. The patient knows to call the clinic with any problems, questions or concerns. No barriers to learning was detected. I spent 10 minutes counseling the patient face to face. The total time spent in the appointment was 15 minutes and more than 50%  was on counseling and review of test results     Heath Lark, MD 05/28/2017 2:35 PM

## 2017-05-28 NOTE — Telephone Encounter (Signed)
Left message for patient regarding upcoming April appointments.  °

## 2017-05-31 LAB — BCR/ABL

## 2017-07-05 ENCOUNTER — Ambulatory Visit: Payer: Medicare Other

## 2017-08-20 ENCOUNTER — Inpatient Hospital Stay: Payer: Medicare Other | Attending: Hematology and Oncology

## 2017-08-20 DIAGNOSIS — C9210 Chronic myeloid leukemia, BCR/ABL-positive, not having achieved remission: Secondary | ICD-10-CM | POA: Diagnosis not present

## 2017-08-20 DIAGNOSIS — Z79899 Other long term (current) drug therapy: Secondary | ICD-10-CM | POA: Diagnosis not present

## 2017-08-20 DIAGNOSIS — D61810 Antineoplastic chemotherapy induced pancytopenia: Secondary | ICD-10-CM | POA: Diagnosis not present

## 2017-08-20 DIAGNOSIS — R197 Diarrhea, unspecified: Secondary | ICD-10-CM | POA: Diagnosis not present

## 2017-08-20 LAB — COMPREHENSIVE METABOLIC PANEL
ALK PHOS: 54 U/L (ref 40–150)
ALT: 18 U/L (ref 0–55)
AST: 35 U/L — ABNORMAL HIGH (ref 5–34)
Albumin: 4.3 g/dL (ref 3.5–5.0)
Anion gap: 10 (ref 3–11)
BUN: 24 mg/dL (ref 7–26)
CHLORIDE: 105 mmol/L (ref 98–109)
CO2: 27 mmol/L (ref 22–29)
CREATININE: 1.06 mg/dL (ref 0.60–1.10)
Calcium: 9.8 mg/dL (ref 8.4–10.4)
GFR, EST AFRICAN AMERICAN: 56 mL/min — AB (ref >60)
GFR, EST NON AFRICAN AMERICAN: 48 mL/min — AB (ref >60)
Glucose, Bld: 87 mg/dL (ref 70–140)
Potassium: 4.2 mmol/L (ref 3.5–5.1)
Sodium: 142 mmol/L (ref 136–145)
TOTAL PROTEIN: 7.1 g/dL (ref 6.4–8.3)
Total Bilirubin: 0.5 mg/dL (ref 0.2–1.2)

## 2017-08-20 LAB — CBC WITH DIFFERENTIAL/PLATELET
Basophils Absolute: 0 10*3/uL (ref 0.0–0.1)
Basophils Relative: 1 %
EOS PCT: 7 %
Eosinophils Absolute: 0.3 10*3/uL (ref 0.0–0.5)
HCT: 32.8 % — ABNORMAL LOW (ref 34.8–46.6)
Hemoglobin: 11 g/dL — ABNORMAL LOW (ref 11.6–15.9)
LYMPHS ABS: 1.1 10*3/uL (ref 0.9–3.3)
LYMPHS PCT: 28 %
MCH: 37.5 pg — AB (ref 25.1–34.0)
MCHC: 33.5 g/dL (ref 31.5–36.0)
MCV: 111.9 fL — AB (ref 79.5–101.0)
Monocytes Absolute: 0.5 10*3/uL (ref 0.1–0.9)
Monocytes Relative: 13 %
Neutro Abs: 2 10*3/uL (ref 1.5–6.5)
Neutrophils Relative %: 51 %
PLATELETS: 154 10*3/uL (ref 145–400)
RBC: 2.93 MIL/uL — AB (ref 3.70–5.45)
RDW: 13.8 % (ref 11.2–14.5)
WBC: 3.8 10*3/uL — AB (ref 3.9–10.3)

## 2017-08-26 ENCOUNTER — Other Ambulatory Visit: Payer: Self-pay | Admitting: Hematology and Oncology

## 2017-08-26 DIAGNOSIS — C9210 Chronic myeloid leukemia, BCR/ABL-positive, not having achieved remission: Secondary | ICD-10-CM

## 2017-08-26 NOTE — Telephone Encounter (Signed)
Can you clarify if she is taking 300 mg daily or 300 mg BID? I thought it is 300 mg daily If so, please refill electronically

## 2017-08-27 ENCOUNTER — Telehealth: Payer: Self-pay | Admitting: Hematology and Oncology

## 2017-08-27 ENCOUNTER — Inpatient Hospital Stay: Payer: Medicare Other | Admitting: Hematology and Oncology

## 2017-08-27 ENCOUNTER — Encounter: Payer: Self-pay | Admitting: Hematology and Oncology

## 2017-08-27 DIAGNOSIS — Z79899 Other long term (current) drug therapy: Secondary | ICD-10-CM | POA: Diagnosis not present

## 2017-08-27 DIAGNOSIS — C9210 Chronic myeloid leukemia, BCR/ABL-positive, not having achieved remission: Secondary | ICD-10-CM | POA: Diagnosis not present

## 2017-08-27 DIAGNOSIS — R197 Diarrhea, unspecified: Secondary | ICD-10-CM | POA: Diagnosis not present

## 2017-08-27 DIAGNOSIS — D61810 Antineoplastic chemotherapy induced pancytopenia: Secondary | ICD-10-CM

## 2017-08-27 DIAGNOSIS — T451X5A Adverse effect of antineoplastic and immunosuppressive drugs, initial encounter: Secondary | ICD-10-CM

## 2017-08-27 NOTE — Assessment & Plan Note (Signed)
This is due to side effects of Gleevec.  It is manageable conservatively.  We will observe for now Plan to continue at dose as above

## 2017-08-27 NOTE — Telephone Encounter (Signed)
Gave patient AVs and calendar of upcoming December appointments.

## 2017-08-27 NOTE — Progress Notes (Signed)
[facility] OFFICE PROGRESS NOTE  Patient Care Team: Binnie Rail, MD as PCP - General (Internal Medicine)  ASSESSMENT & PLAN:  Chronic myeloid leukemia Her most recent blood work show undetectable BCR/ABL I recommend she continues treatment at 300 mg daily  She will get repeat blood work in August while she is in [place] and will return to see me in December with blood work, history and physical examination   Pancytopenia due to antineoplastic chemotherapy [facility]) This is likely due to recent treatment. The patient denies recent history of bleeding such as epistaxis, hematuria or hematochezia. She is asymptomatic from the anemia & thrombocytopenia. I will observe for now.  Diarrhea This is due to side effects of Gleevec.  It is manageable conservatively.  We will observe for now Plan to continue at dose as above   No orders of the defined types were placed in this encounter.   INTERVAL HISTORY: Please see below for problem oriented charting. She returns for further follow-up She is compliant taking medications as prescribed She continues to have mild loose bowel movement but not severe She denies recent infection Her joint pains are stable  SUMMARY OF ONCOLOGIC HISTORY:   Chronic myeloid leukemia ([place])   02/18/2007 Initial Diagnosis    Chronic myeloid leukemia      08/20/2014 Tumor Marker    BCR/ABL is undetectable      03/04/2015 Pathology Results    BCR/ABL is undetectable      08/29/2015 Tumor Marker    BCR/ABL is undetectable      02/29/2016 Pathology Results    BCR/ABL is undetectable      05/01/2016 Pathology Results    BCR/ABL b2a2 is detected at 0.03% and IS ratio 0.069%      05/31/2016 Pathology Results    BCR/ABL b2a2 is detected at 0.12% and IS ratio 0.276%      06/19/2016 Pathology Results    BCR/ABL b2a2 is detected at 0.24% and IS ratio 0.552%      06/19/2016 Miscellaneous    ABL mutation kinase testing is negative      06/27/2016 Miscellaneous    Dose of Gleevec is increased to 600 mg daily      07/25/2016 Pathology Results    BCR/ABL b2a2 is detected at 0.03% and IS ratio 0.069%. She has achieved MMR      08/30/2016 Miscellaneous    Dose of Gleevec is reduced to 300 mg daily and subsequently down to 200 mg daily      10/22/2016 Pathology Results    BCR/ABL b2a2 is not detected. She has achieved MMR      01/03/2017 Pathology Results    BCR/ABL b2a2 is detected IS ratio 0.041%      02/18/2017 Pathology Results    BCR/ABL b2a2 is detected IS ratio 0.0126      05/21/2017 Pathology Results    BCR/ABL b2a2 is not detected. She has achieved MMR      08/20/2017 Pathology Results    BCR/ABL b2a2 is not detected. She has achieved MMR       REVIEW OF SYSTEMS:   Constitutional: Denies fevers, chills or abnormal weight loss Eyes: Denies blurriness of vision Ears, nose, mouth, throat, and face: Denies mucositis or sore throat Respiratory: Denies cough, dyspnea or wheezes Cardiovascular: Denies palpitation, chest discomfort or lower extremity swelling Gastrointestinal:  Denies nausea, heartburn or change in bowel habits Skin: Denies abnormal skin rashes Lymphatics: Denies new lymphadenopathy or easy bruising Neurological:Denies numbness, tingling or new  weaknesses Behavioral/Psych: Mood is stable, no new changes  All other systems were reviewed with the patient and are negative.  I have reviewed the past medical history, past surgical history, social history and family history with the patient and they are unchanged from previous note.  ALLERGIES:  is allergic to penicillins.  MEDICATIONS:  Current Outpatient Medications  Medication Sig Dispense Refill  . aspirin 81 MG tablet Take 81 mg by mouth daily.    . Biotin 10000 MCG TABS Take 0.25 tablets by mouth daily.    . Calcium Carb-Cholecalciferol (CALCIUM 600 + D PO) Take 1 tablet by mouth daily.    . Cyanocobalamin (B-12) 5000 MCG CAPS Take 1  tablet by mouth daily.    Marland Kitchen estradiol (ESTRACE VAGINAL) 0.1 MG/GM vaginal cream Place 1 Applicatorful vaginally 2 (two) times a week. (Patient taking differently: Place 1 Applicatorful vaginally once a week. ) 42.5 g 5  . FINACEA 15 % cream Apply 1 application topically daily.  2  . folic acid (FOLVITE) 1 MG tablet TAKE 1 TABLET(1 MG) BY MOUTH DAILY 90 tablet 3  . Glucosamine-Chondroit-Vit C-Mn (GLUCOSAMINE CHONDR 1500 COMPLX) CAPS Take 1 capsule by mouth daily.    Marland Kitchen ibuprofen (ADVIL,MOTRIN) 200 MG tablet Take 400 mg by mouth every 6 (six) hours as needed for moderate pain.     Marland Kitchen imatinib (GLEEVEC) 100 MG tablet TAKE 3 TABLETS BY MOUTH EVERY DAY WITH MEAL AND LARGE GLASS OF WATER 270 tablet 0  . imatinib (GLEEVEC) 100 MG tablet Take 3 tablets (300 mg total) by mouth daily. 270 tablet 3  . Loperamide HCl (IMODIUM PO) Take 0.5-1 tablets by mouth 2 (two) times daily as needed (takes 0.5 tablet every morning and 2nd dose if needed for diarrhea).     . Multiple Vitamin (MULTIVITAMIN) tablet Take 1 tablet by mouth daily.    . Probiotic Product (ALIGN) 4 MG CAPS Take 1 capsule by mouth daily.     No current facility-administered medications for this visit.     PHYSICAL EXAMINATION: ECOG PERFORMANCE STATUS: 1 - Symptomatic but completely ambulatory  Vitals:   08/27/17 1041  BP: (!) 119/56  Pulse: 75  Resp: 18  Temp: 98.5 F (36.9 C)  SpO2: 100%   Filed Weights   08/27/17 1041  Weight: 126 lb (57.2 kg)    GENERAL:alert, no distress and comfortable SKIN: skin color, texture, turgor are normal, no rashes or significant lesions EYES: normal, Conjunctiva are pink and non-injected, sclera clear OROPHARYNX:no exudate, no erythema and lips, buccal mucosa, and tongue normal  NECK: supple, thyroid normal size, non-tender, without nodularity LYMPH:  no palpable lymphadenopathy in the cervical, axillary or inguinal LUNGS: clear to auscultation and percussion with normal breathing effort HEART:  regular rate & rhythm and no murmurs and no lower extremity edema ABDOMEN:abdomen soft, non-tender and normal bowel sounds Musculoskeletal:no cyanosis of digits and no clubbing  NEURO: alert & oriented x 3 with fluent speech, no focal motor/sensory deficits  LABORATORY DATA:  I have reviewed the data as listed    Component Value Date/Time   NA 142 08/20/2017 1011   NA 140 02/18/2017 0808   K 4.2 08/20/2017 1011   K 4.4 02/18/2017 0808   CL 105 08/20/2017 1011   CL 105 09/04/2012 1006   CO2 27 08/20/2017 1011   CO2 29 02/18/2017 0808   GLUCOSE 87 08/20/2017 1011   GLUCOSE 86 02/18/2017 0808   GLUCOSE 96 09/04/2012 1006   BUN 24 08/20/2017 1011  BUN 20.3 02/18/2017 0808   CREATININE 1.06 08/20/2017 1011   CREATININE 1.1 02/18/2017 0808   CALCIUM 9.8 08/20/2017 1011   CALCIUM 9.4 02/18/2017 0808   PROT 7.1 08/20/2017 1011   PROT 6.6 02/18/2017 0808   ALBUMIN 4.3 08/20/2017 1011   ALBUMIN 4.2 02/18/2017 0808   AST 35 (H) 08/20/2017 1011   AST 34 02/18/2017 0808   ALT 18 08/20/2017 1011   ALT 18 02/18/2017 0808   ALKPHOS 54 08/20/2017 1011   ALKPHOS 53 02/18/2017 0808   BILITOT 0.5 08/20/2017 1011   BILITOT 0.76 02/18/2017 0808   GFRNONAA 48 (L) 08/20/2017 1011   GFRAA 56 (L) 08/20/2017 1011    No results found for: SPEP, UPEP  Lab Results  Component Value Date   WBC 3.8 (L) 08/20/2017   NEUTROABS 2.0 08/20/2017   HGB 11.0 (L) 08/20/2017   HCT 32.8 (L) 08/20/2017   MCV 111.9 (H) 08/20/2017   PLT 154 08/20/2017      Chemistry      Component Value Date/Time   NA 142 08/20/2017 1011   NA 140 02/18/2017 0808   K 4.2 08/20/2017 1011   K 4.4 02/18/2017 0808   CL 105 08/20/2017 1011   CL 105 09/04/2012 1006   CO2 27 08/20/2017 1011   CO2 29 02/18/2017 0808   BUN 24 08/20/2017 1011   BUN 20.3 02/18/2017 0808   CREATININE 1.06 08/20/2017 1011   CREATININE 1.1 02/18/2017 0808      Component Value Date/Time   CALCIUM 9.8 08/20/2017 1011   CALCIUM 9.4  02/18/2017 0808   ALKPHOS 54 08/20/2017 1011   ALKPHOS 53 02/18/2017 0808   AST 35 (H) 08/20/2017 1011   AST 34 02/18/2017 0808   ALT 18 08/20/2017 1011   ALT 18 02/18/2017 0808   BILITOT 0.5 08/20/2017 1011   BILITOT 0.76 02/18/2017 0808       All questions were answered. The patient knows to call the clinic with any problems, questions or concerns. No barriers to learning was detected.  I spent 15 minutes counseling the patient face to face. The total time spent in the appointment was 20 minutes and more than 50% was on counseling and review of test results  Heath Lark, MD 08/27/2017 4:11 PM

## 2017-08-27 NOTE — Assessment & Plan Note (Signed)
This is likely due to recent treatment. The patient denies recent history of bleeding such as epistaxis, hematuria or hematochezia. She is asymptomatic from the anemia & thrombocytopenia. I will observe for now.

## 2017-08-27 NOTE — Assessment & Plan Note (Signed)
Her most recent blood work show undetectable BCR/ABL I recommend she continues treatment at 300 mg daily  She will get repeat blood work in August while she is in [place] and will return to see me in December with blood work, history and physical examination

## 2017-08-28 LAB — BCR/ABL

## 2017-10-01 ENCOUNTER — Other Ambulatory Visit: Payer: Self-pay | Admitting: Internal Medicine

## 2017-10-01 NOTE — Telephone Encounter (Signed)
Copied from [place] [id number]. Topic: Quick Communication - Rx Refill/Question >> Oct 01, 2017  8:06 AM Aurelio Brash B wrote: Medication:estradiol (ESTRACE VAGINAL) 0.1 MG/GM vaginal cream  Has the patient contacted their pharmacy? yes (Agent: If no, request that the patient contact the pharmacy for the refill.) (Agent: If yes, when and what did the pharmacy advise? Preferred Pharmacy (with phone number or street name): [facility] [place], [place] - [address] [place] [phone] (Phone) [phone] (Fax)      Agent: Please be advised that RX refills may take up to 3 business days. We ask that you follow-up with your pharmacy.

## 2017-10-02 NOTE — Telephone Encounter (Signed)
Rx refill request: Estrace vaginal cream       Last filled: 08/17/15  LOV: 02/26/17  PCP: Quay Burow  Pharmacy: [facility]

## 2017-10-02 NOTE — Telephone Encounter (Signed)
Denied refill pt has not had refill since 2017. Will need OV for approval../lmb

## 2017-10-14 NOTE — Progress Notes (Signed)
Subjective:    Patient ID: [name] [name], female    DOB: 07/18/1934, 83 y.o.   MRN: [id number]  HPI The patient is here for follow up.  Vaginal atrophy:  She uses the estrace cream twice a week intravaginally.  As far as she knows it works - she denies any pain or problems.  She would like a refill.  She no longer sees gyn.    Fall:  She fell one and a half weeks ago.  She hurt her left shoulder and saw ortho last week.  She had a MRI and has a follow up tomorrow.  She has decreased ROM and pain.  She may have torn her rotator cuff and may need arthroscopic surgery.    The end of last week she had neck pain and stiffness - she denies any injury.  Ortho said the muscles were tight in the left posterior neck and prescribed a muscle relaxer, which has helped.     Since she fell she has been a little off balance.  Typically walks for exercise.  She does one foot raises while she brushes her teeth twice daily and has done that for years.    Medications and allergies reviewed with patient and updated if appropriate.  Patient Active Problem List   Diagnosis Date Noted  . B12 deficiency 02/26/2017  . Greater trochanteric bursitis of left hip 10/24/2016  . Bone lesion 09/18/2016  . Urinary incontinence 04/11/2016  . Change in bowel function 04/11/2016  . Vitamin D deficiency 10/02/2015  . Other fatigue 04/07/2015  . History of colonic polyps 08/06/2014  . Thrombocytopenia due to drugs 02/18/2014  . Leukopenia due to antineoplastic chemotherapy ([place]) 02/18/2014  . 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 History of breast cancer 07/18/2012  . Osteopenia 09/15/2011  . Diarrhea 08/29/2011  . Family history of malignant neoplasm of gastrointestinal tract 08/29/2011  . Hyperlipidemia 04/14/2009  . Pancytopenia due to antineoplastic chemotherapy ([place]) 04/14/2009  . VISUAL IMPAIRMENT, TRANSIENT 04/14/2009  . RECTOVAGINAL FISTULA 11/10/2008  . DIVERTICULOSIS OF COLON 04/06/2008  . Chronic myeloid  leukemia ([place]) 02/18/2007  . ROSACEA 02/18/2007    Current Outpatient Medications on File Prior to Visit  Medication Sig Dispense Refill  . aspirin 81 MG tablet Take 81 mg by mouth daily.    . Biotin 10000 MCG TABS Take 0.25 tablets by mouth daily.    . Calcium Carb-Cholecalciferol (CALCIUM 600 + D PO) Take 1 tablet by mouth daily.    . Cyanocobalamin (B-12) 5000 MCG CAPS Take 1 tablet by mouth daily.    Marland Kitchen estradiol (ESTRACE VAGINAL) 0.1 MG/GM vaginal cream Place 1 Applicatorful vaginally 2 (two) times a week. (Patient taking differently: Place 1 Applicatorful vaginally once a week. ) 42.5 g 5  . FINACEA 15 % cream Apply 1 application topically daily.  2  . folic acid (FOLVITE) 1 MG tablet TAKE 1 TABLET(1 MG) BY MOUTH DAILY 90 tablet 3  . Glucosamine-Chondroit-Vit C-Mn (GLUCOSAMINE CHONDR 1500 COMPLX) CAPS Take 1 capsule by mouth daily.    Marland Kitchen ibuprofen (ADVIL,MOTRIN) 200 MG tablet Take 400 mg by mouth every 6 (six) hours as needed for moderate pain.     Marland Kitchen imatinib (GLEEVEC) 100 MG tablet Take 3 tablets (300 mg total) by mouth daily. 270 tablet 3  . Loperamide HCl (IMODIUM PO) Take 0.5-1 tablets by mouth 2 (two) times daily as needed (takes 0.5 tablet every morning and 2nd dose if needed for diarrhea).     . Multiple  Vitamin (MULTIVITAMIN) tablet Take 1 tablet by mouth daily.    . Probiotic Product (ALIGN) 4 MG CAPS Take 1 capsule by mouth daily.     No current facility-administered medications on file prior to visit.     Past Medical History:  Diagnosis Date  . Anemia requiring transfusions 2010 & 2015  . Arthritis    DJD  . Breast cancer ([place]) 1993   S/P lumpectomy , radiation, & Tamoxifen  . CML (chronic myeloid leukemia) ([facility])    Dr Alvy Bimler    Past Surgical History:  Procedure Laterality Date  . ABDOMINAL HYSTERECTOMY  ~1989   no BSO, for Dysplasia   . APPENDECTOMY  ~1989  . BILATERAL SALPINGOOPHORECTOMY  2010   with bowel resection  . BREAST SURGERY     Right lumpectomy,  s/p radiation 1993 and oral  Tamoxifen x 5 years ([id number])   . CATARACT EXTRACTION  06/08/2014   left eye;Dr Herbert Deaner  . COLONOSCOPY  03/2003, last 2010   diverticulosis, Dr.Kaplan  . EXCISION MORTON'S NEUROMA Left mid 80's   foot  . gastrocslide     left leg; Dr Beola Cord  . ileostomy reversal  2011   3 mos after above  . KNEE SURGERY  mid 80's   Arthroscopy, right knee   . LEG TENDON SURGERY Left 2013   "cut on leg and fixed something"  . OPEN SURGICAL REPAIR OF GLUTEAL TENDON Left 10/24/2016   Procedure: Left hip bursectomy; gluteal tendon repair;  Surgeon: Gaynelle Arabian, MD;  Location: [facility];  Service: Orthopedics;  Laterality: Left;  . sigmoid colon resection  2010    Dr Arlyce Dice, [place] for fistula & diverticulitis  . TOTAL KNEE ARTHROPLASTY Right 06/11/2013   Procedure: RIGHT TOTAL KNEE ARTHROPLASTY;  Surgeon: Sydnee Cabal, MD;  Location: [facility];  Service: Orthopedics;  Laterality: Right;  . TRIGGER FINGER RELEASE Left 2013   thumb    Social History   Socioeconomic History  . Marital status: Married    Spouse name: Not on file  . Number of children: Not on file  . Years of education: Not on file  . Highest education level: Not on file  Occupational History  . Not on file  Social Needs  . Financial resource strain: Not on file  . Food insecurity:    Worry: Not on file    Inability: Not on file  . Transportation needs:    Medical: Not on file    Non-medical: Not on file  Tobacco Use  . Smoking status: Former Smoker    Years: 5.00    Types: Cigarettes    Last attempt to quit: 04/30/1962    Years since quitting: 55.4  . Smokeless tobacco: Never Used  . Tobacco comment: smoked 1956-1964, up to 1/2 ppd  Substance and Sexual Activity  . Alcohol use: Yes    Alcohol/week: 4.2 - 8.4 oz    Types: 7 - 14 Glasses of wine per week  . Drug use: No  . Sexual activity: Yes  Lifestyle  . Physical activity:    Days per week: Not on file    Minutes per session: Not on  file  . Stress: Not on file  Relationships  . Social connections:    Talks on phone: Not on file    Gets together: Not on file    Attends religious service: Not on file    Active member of club or organization: Not on file    Attends meetings of clubs or organizations:  Not on file    Relationship status: Not on file  Other Topics Concern  . Not on file  Social History Narrative  . Not on file    Family History  Problem Relation Age of Onset  . Colon cancer Mother 97  . Uterine cancer Sister   . Diabetes Cousin   . Cancer Father        Lip cancer  . Aneurysm Father        AAA;S/P surgery  . Breast cancer Paternal Aunt   . Heart attack Paternal Uncle 59  . Stroke Neg Hx   . Esophageal cancer Neg Hx   . Rectal cancer Neg Hx   . Stomach cancer Neg Hx     Review of Systems  Constitutional: Negative for fever.  Respiratory: Negative for cough, shortness of breath and wheezing.   Cardiovascular: Negative for chest pain, palpitations and leg swelling.  Neurological: Positive for light-headedness (position). Negative for dizziness and headaches.       Objective:   Vitals:   10/15/17 0912  BP: 120/62  Pulse: 95  Resp: 16  Temp: 98.1 F (36.7 C)  SpO2: 97%   BP Readings from Last 3 Encounters:  10/15/17 120/62  08/27/17 (!) 119/56  05/28/17 124/62   Wt Readings from Last 3 Encounters:  10/15/17 122 lb (55.3 kg)  08/27/17 126 lb (57.2 kg)  05/28/17 124 lb 14.4 oz (56.7 kg)   Body mass index is 23.05 kg/m.   Physical Exam    Constitutional: Appears well-developed and well-nourished. No distress.  HENT:  Head: Normocephalic and atraumatic.  Neck: Neck supple. No tracheal deviation present. No thyromegaly present.  No cervical lymphadenopathy Cardiovascular: Normal rate, regular rhythm and normal heart sounds.   No murmur heard. No carotid bruit .  No edema Pulmonary/Chest: Effort normal and breath sounds normal. No respiratory distress. No has no wheezes. No  rales.  Skin: Skin is warm and dry. Not diaphoretic.  Psychiatric: Normal mood and affect. Behavior is normal.      Assessment & Plan:    See Problem List for Assessment and Plan of chronic medical problems.

## 2017-10-15 ENCOUNTER — Encounter: Payer: Self-pay | Admitting: Internal Medicine

## 2017-10-15 ENCOUNTER — Ambulatory Visit: Payer: Medicare Other | Admitting: Internal Medicine

## 2017-10-15 DIAGNOSIS — N952 Postmenopausal atrophic vaginitis: Secondary | ICD-10-CM | POA: Diagnosis not present

## 2017-10-15 MED ORDER — ESTRADIOL 0.1 MG/GM VA CREA: 1.0000 | TOPICAL_CREAM | VAGINAL | 8 refills | Status: DC

## 2017-10-15 MED ORDER — FOLIC ACID 1 MG PO TABS: ORAL_TABLET | ORAL | 3 refills | Status: DC

## 2017-10-15 NOTE — Patient Instructions (Addendum)
   Medications reviewed and updated.  No changes recommended at this time.  Your prescription(s) have been submitted to your pharmacy. Please take as directed and contact our office if you believe you are having problem(s) with the medication(s).      

## 2017-10-15 NOTE — Assessment & Plan Note (Signed)
Using estrace vaginally twice a week Will continue Refilled today

## 2017-10-16 ENCOUNTER — Telehealth: Payer: Self-pay | Admitting: Internal Medicine

## 2017-10-16 NOTE — Telephone Encounter (Signed)
Pt dropped off surgical clearance form from [facility].  Please complete form and fax back to Santiago Bur at [phone] as soon as possible so pt can get her surgery scheduled. Form placed in Dr. Quay Burow' box for pick up & completion.

## 2017-10-18 NOTE — Telephone Encounter (Signed)
Form faxed the [facility].

## 2017-10-21 NOTE — Progress Notes (Signed)
Subjective:    Patient ID: [name] [name], female    DOB: [birth year], 83 y.o.   MRN: [id number]  HPI The patient is here for an acute visit.  Right middle finger pain: 5 days ago she woke up in her right middle finger was slightly swollen and tender.  She denies any obvious injury.  She had fallen 2 weeks earlier, but does not recall injuring her finger at that time and did not have pain prior to 5 days ago.  In addition to being swollen and tender it was slightly red.  Over the next few days her symptoms got worse.  The finger is warm to touch and difficult to bend.  She denies any numbness or tingling.  She has not had any fevers or chills.  She denies previous episodes of anything similar.  She has taken aspirin, but no other medications for this.     Medications and allergies reviewed with patient and updated if appropriate.  Patient Active Problem List   Diagnosis Date Noted  . Vaginal atrophy 10/15/2017  . B12 deficiency 02/26/2017  . Bone lesion 09/18/2016  . Urinary incontinence 04/11/2016  . Vitamin D deficiency 10/02/2015  . History of colonic polyps 08/06/2014  . Thrombocytopenia due to drugs 02/18/2014  . Leukopenia due to antineoplastic chemotherapy ([place]) 02/18/2014  . 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 History of breast cancer 07/18/2012  . Osteopenia 09/15/2011  . Diarrhea 08/29/2011  . Family history of malignant neoplasm of gastrointestinal tract 08/29/2011  . Hyperlipidemia 04/14/2009  . Pancytopenia due to antineoplastic chemotherapy ([place]) 04/14/2009  . DIVERTICULOSIS OF COLON 04/06/2008  . Chronic myeloid leukemia ([place]) 02/18/2007  . ROSACEA 02/18/2007    Current Outpatient Medications on File Prior to Visit  Medication Sig Dispense Refill  . aspirin 81 MG tablet Take 81 mg by mouth daily.    . Biotin 10000 MCG TABS Take 0.25 tablets by mouth daily.    . Calcium Carb-Cholecalciferol (CALCIUM 600 + D PO) Take 1 tablet by mouth daily.    . Cyanocobalamin  (B-12) 5000 MCG CAPS Take 1 tablet by mouth daily.    Marland Kitchen estradiol (ESTRACE VAGINAL) 0.1 MG/GM vaginal cream Place 1 Applicatorful vaginally 2 (two) times a week. 42.5 g 8  . FINACEA 15 % cream Apply 1 application topically daily.  2  . folic acid (FOLVITE) 1 MG tablet TAKE 1 TABLET(1 MG) BY MOUTH DAILY 90 tablet 3  . Glucosamine-Chondroit-Vit C-Mn (GLUCOSAMINE CHONDR 1500 COMPLX) CAPS Take 1 capsule by mouth daily.    Marland Kitchen ibuprofen (ADVIL,MOTRIN) 200 MG tablet Take 400 mg by mouth every 6 (six) hours as needed for moderate pain.     Marland Kitchen imatinib (GLEEVEC) 100 MG tablet Take 3 tablets (300 mg total) by mouth daily. 270 tablet 3  . Loperamide HCl (IMODIUM PO) Take 0.5-1 tablets by mouth 2 (two) times daily as needed (takes 0.5 tablet every morning and 2nd dose if needed for diarrhea).     . Multiple Vitamin (MULTIVITAMIN) tablet Take 1 tablet by mouth daily.    . Probiotic Product (ALIGN) 4 MG CAPS Take 1 capsule by mouth daily.     No current facility-administered medications on file prior to visit.     Past Medical History:  Diagnosis Date  . Anemia requiring transfusions 2010 & 2015  . Arthritis    DJD  . Breast cancer ([place]) 1993   S/P lumpectomy , radiation, & Tamoxifen  . CML (chronic myeloid leukemia) ([place])  Dr Alvy Bimler    Past Surgical History:  Procedure Laterality Date  . ABDOMINAL HYSTERECTOMY  ~1989   no BSO, for Dysplasia   . APPENDECTOMY  ~1989  . BILATERAL SALPINGOOPHORECTOMY  2010   with bowel resection  . BREAST SURGERY     Right lumpectomy, s/p radiation 1993 and oral  Tamoxifen x 5 years ([id number])   . CATARACT EXTRACTION  06/08/2014   left eye;Dr Herbert Deaner  . COLONOSCOPY  03/2003, last 2010   diverticulosis, Dr.Kaplan  . EXCISION MORTON'S NEUROMA Left mid 80's   foot  . gastrocslide     left leg; Dr Beola Cord  . ileostomy reversal  2011   3 mos after above  . KNEE SURGERY  mid 80's   Arthroscopy, right knee   . LEG TENDON SURGERY Left 2013   "cut on leg and  fixed something"  . OPEN SURGICAL REPAIR OF GLUTEAL TENDON Left 10/24/2016   Procedure: Left hip bursectomy; gluteal tendon repair;  Surgeon: Gaynelle Arabian, MD;  Location: [facility];  Service: Orthopedics;  Laterality: Left;  . sigmoid colon resection  2010    Dr Arlyce Dice, [place] for fistula & diverticulitis  . TOTAL KNEE ARTHROPLASTY Right 06/11/2013   Procedure: RIGHT TOTAL KNEE ARTHROPLASTY;  Surgeon: Sydnee Cabal, MD;  Location: [facility];  Service: Orthopedics;  Laterality: Right;  . TRIGGER FINGER RELEASE Left 2013   thumb    Social History   Socioeconomic History  . Marital status: Married    Spouse name: Not on file  . Number of children: Not on file  . Years of education: Not on file  . Highest education level: Not on file  Occupational History  . Not on file  Social Needs  . Financial resource strain: Not on file  . Food insecurity:    Worry: Not on file    Inability: Not on file  . Transportation needs:    Medical: Not on file    Non-medical: Not on file  Tobacco Use  . Smoking status: Former Smoker    Years: 5.00    Types: Cigarettes    Last attempt to quit: 04/30/1962    Years since quitting: 55.5  . Smokeless tobacco: Never Used  . Tobacco comment: smoked 1956-1964, up to 1/2 ppd  Substance and Sexual Activity  . Alcohol use: Yes    Alcohol/week: 4.2 - 8.4 oz    Types: 7 - 14 Glasses of wine per week  . Drug use: No  . Sexual activity: Yes  Lifestyle  . Physical activity:    Days per week: Not on file    Minutes per session: Not on file  . Stress: Not on file  Relationships  . Social connections:    Talks on phone: Not on file    Gets together: Not on file    Attends religious service: Not on file    Active member of club or organization: Not on file    Attends meetings of clubs or organizations: Not on file    Relationship status: Not on file  Other Topics Concern  . Not on file  Social History Narrative  . Not on file    Family History  Problem  Relation Age of Onset  . Colon cancer Mother 63  . Uterine cancer Sister   . Diabetes Cousin   . Cancer Father        Lip cancer  . Aneurysm Father        AAA;S/P surgery  . Breast cancer  Paternal Aunt   . Heart attack Paternal Uncle 105  . Stroke Neg Hx   . Esophageal cancer Neg Hx   . Rectal cancer Neg Hx   . Stomach cancer Neg Hx     Review of Systems  Constitutional: Negative for chills and fever.  Skin: Positive for color change (Right middle finger warm, red and swollen). Negative for wound.  Neurological: Negative for weakness and numbness.       Objective:   Vitals:   10/22/17 0925  BP: 120/62  Pulse: 87  Resp: 16  Temp: 98.4 F (36.9 C)  SpO2: 98%   BP Readings from Last 3 Encounters:  10/22/17 120/62  10/15/17 120/62  08/27/17 (!) 119/56   Wt Readings from Last 3 Encounters:  10/22/17 121 lb (54.9 kg)  10/15/17 122 lb (55.3 kg)  08/27/17 126 lb (57.2 kg)   Body mass index is 22.86 kg/m.   Physical Exam  Constitutional: She appears well-developed and well-nourished. No distress.  HENT:  Head: Normocephalic and atraumatic.  Musculoskeletal:  Right index finger swollen particularly around the DIP joint, decreased range of motion of DIP joint, pain with palpation, warm to touch, no fluctuance; no tenderness to palpation of the PIP joint  Skin: She is not diaphoretic. There is erythema (Right middle finger erythematous near the DIP joint that extends proximal towards PIP joint and to tip of finger).  No wound or skin break in the finger           Assessment & Plan:    See Problem List for Assessment and Plan of chronic medical problems.

## 2017-10-22 ENCOUNTER — Encounter: Payer: Self-pay | Admitting: Internal Medicine

## 2017-10-22 ENCOUNTER — Ambulatory Visit: Payer: Medicare Other | Admitting: Internal Medicine

## 2017-10-22 VITALS — BP 120/62 | HR 87 | Temp 98.4°F | Resp 16 | Wt 121.0 lb

## 2017-10-22 DIAGNOSIS — M25541 Pain in joints of right hand: Secondary | ICD-10-CM | POA: Insufficient documentation

## 2017-10-22 MED ORDER — INDOMETHACIN 50 MG PO CAPS: 50.0000 mg | ORAL_CAPSULE | Freq: Three times a day (TID) | ORAL | 0 refills | Status: DC

## 2017-10-22 MED ORDER — DOXYCYCLINE HYCLATE 100 MG PO TABS: 100.0000 mg | ORAL_TABLET | Freq: Two times a day (BID) | ORAL | 0 refills | Status: DC

## 2017-10-22 NOTE — Assessment & Plan Note (Signed)
Associated with swelling, redness and warmth along with decreased range of motion of right middle finger PIP joint No obvious injury or cause Cellulitis versus gout/pseudogout Start doxycycline twice daily 7-10 days depending on response Start indomethacin 50 mg 2 times daily with food-intake for 1 full day after symptoms resolved Advised to stop indomethacin if she develops stomach upset or GERD Call if no improvement

## 2017-10-22 NOTE — Patient Instructions (Addendum)
Start doxycyline as prescribed for possible infection.  You can take only for 1 week instead of the full 10 days if infection has resolved.   Start indomethacin three times a day with food.  If you have stomach upset stop the medication.  Take for one full day after your finger symptoms resolve.

## 2017-11-05 ENCOUNTER — Telehealth: Payer: Self-pay | Admitting: *Deleted

## 2017-11-05 ENCOUNTER — Telehealth: Payer: Self-pay | Admitting: Hematology and Oncology

## 2017-11-05 NOTE — Telephone Encounter (Signed)
LM with note below 

## 2017-11-05 NOTE — Telephone Encounter (Signed)
Spoke to patient regarding upcoming July appts per 7/9 sch message

## 2017-11-05 NOTE — Telephone Encounter (Signed)
I will send scheduling msg for labs and appt to see me

## 2017-11-05 NOTE — Telephone Encounter (Signed)
Pt left a message stating she thinks Dr Alvy Bimler wants her to have labs drawn. She is now back in [place].

## 2017-11-08 ENCOUNTER — Inpatient Hospital Stay: Payer: Medicare Other

## 2017-11-08 ENCOUNTER — Telehealth: Payer: Self-pay | Admitting: Hematology and Oncology

## 2017-11-08 NOTE — Telephone Encounter (Signed)
Patient called in to R/S her Lab and F/u appointment /  Appt changed per patient request 7/12

## 2017-11-11 ENCOUNTER — Inpatient Hospital Stay: Payer: Medicare Other | Attending: Hematology and Oncology

## 2017-11-11 DIAGNOSIS — Z79899 Other long term (current) drug therapy: Secondary | ICD-10-CM | POA: Insufficient documentation

## 2017-11-11 DIAGNOSIS — C9210 Chronic myeloid leukemia, BCR/ABL-positive, not having achieved remission: Secondary | ICD-10-CM | POA: Diagnosis not present

## 2017-11-11 DIAGNOSIS — D61810 Antineoplastic chemotherapy induced pancytopenia: Secondary | ICD-10-CM | POA: Diagnosis not present

## 2017-11-11 DIAGNOSIS — Z853 Personal history of malignant neoplasm of breast: Secondary | ICD-10-CM | POA: Insufficient documentation

## 2017-11-11 LAB — COMPREHENSIVE METABOLIC PANEL
ALK PHOS: 50 U/L (ref 38–126)
ALT: 19 U/L (ref 0–44)
ANION GAP: 5 (ref 5–15)
AST: 32 U/L (ref 15–41)
Albumin: 4 g/dL (ref 3.5–5.0)
BILIRUBIN TOTAL: 0.5 mg/dL (ref 0.3–1.2)
BUN: 19 mg/dL (ref 8–23)
CALCIUM: 9.1 mg/dL (ref 8.9–10.3)
CO2: 29 mmol/L (ref 22–32)
Chloride: 102 mmol/L (ref 98–111)
Creatinine, Ser: 1 mg/dL (ref 0.44–1.00)
GFR calc non Af Amer: 51 mL/min — ABNORMAL LOW (ref >60)
GFR, EST AFRICAN AMERICAN: 60 mL/min — AB (ref >60)
Glucose, Bld: 99 mg/dL (ref 70–99)
Potassium: 4.5 mmol/L (ref 3.5–5.1)
Sodium: 136 mmol/L (ref 135–145)
TOTAL PROTEIN: 6.2 g/dL — AB (ref 6.5–8.1)

## 2017-11-11 LAB — CBC WITH DIFFERENTIAL/PLATELET
BASOS ABS: 0 10*3/uL (ref 0.0–0.1)
BASOS PCT: 1 %
EOS ABS: 0.1 10*3/uL (ref 0.0–0.5)
Eosinophils Relative: 3 %
HEMATOCRIT: 28.9 % — AB (ref 34.8–46.6)
HEMOGLOBIN: 9.7 g/dL — AB (ref 11.6–15.9)
Lymphocytes Relative: 29 %
Lymphs Abs: 0.8 10*3/uL — ABNORMAL LOW (ref 0.9–3.3)
MCH: 38 pg — AB (ref 25.1–34.0)
MCHC: 33.5 g/dL (ref 31.5–36.0)
MCV: 113.5 fL — AB (ref 79.5–101.0)
MONOS PCT: 13 %
Monocytes Absolute: 0.3 10*3/uL (ref 0.1–0.9)
NEUTROS PCT: 54 %
Neutro Abs: 1.4 10*3/uL — ABNORMAL LOW (ref 1.5–6.5)
Platelets: 117 10*3/uL — ABNORMAL LOW (ref 145–400)
RBC: 2.55 MIL/uL — ABNORMAL LOW (ref 3.70–5.45)
RDW: 13.7 % (ref 11.2–14.5)
WBC: 2.6 10*3/uL — ABNORMAL LOW (ref 3.9–10.3)

## 2017-11-18 ENCOUNTER — Ambulatory Visit: Payer: Medicare Other | Admitting: Hematology and Oncology

## 2017-11-20 DIAGNOSIS — M75102 Unspecified rotator cuff tear or rupture of left shoulder, not specified as traumatic: Secondary | ICD-10-CM | POA: Insufficient documentation

## 2017-11-21 ENCOUNTER — Inpatient Hospital Stay: Payer: Medicare Other | Admitting: Hematology and Oncology

## 2017-11-21 ENCOUNTER — Telehealth: Payer: Self-pay | Admitting: Hematology and Oncology

## 2017-11-21 ENCOUNTER — Inpatient Hospital Stay: Payer: Medicare Other

## 2017-11-21 VITALS — BP 155/95 | HR 87 | Temp 98.4°F | Resp 18 | Ht 61.0 in | Wt 120.0 lb

## 2017-11-21 DIAGNOSIS — Z853 Personal history of malignant neoplasm of breast: Secondary | ICD-10-CM

## 2017-11-21 DIAGNOSIS — D500 Iron deficiency anemia secondary to blood loss (chronic): Secondary | ICD-10-CM

## 2017-11-21 DIAGNOSIS — T451X5A Adverse effect of antineoplastic and immunosuppressive drugs, initial encounter: Secondary | ICD-10-CM

## 2017-11-21 DIAGNOSIS — C9210 Chronic myeloid leukemia, BCR/ABL-positive, not having achieved remission: Secondary | ICD-10-CM | POA: Diagnosis not present

## 2017-11-21 DIAGNOSIS — E538 Deficiency of other specified B group vitamins: Secondary | ICD-10-CM

## 2017-11-21 DIAGNOSIS — D509 Iron deficiency anemia, unspecified: Secondary | ICD-10-CM | POA: Insufficient documentation

## 2017-11-21 DIAGNOSIS — D61810 Antineoplastic chemotherapy induced pancytopenia: Secondary | ICD-10-CM | POA: Diagnosis not present

## 2017-11-21 LAB — CBC WITH DIFFERENTIAL/PLATELET
Basophils Absolute: 0 10*3/uL (ref 0.0–0.1)
Basophils Relative: 1 %
Eosinophils Absolute: 0.1 10*3/uL (ref 0.0–0.5)
Eosinophils Relative: 3 %
HEMATOCRIT: 27.2 % — AB (ref 34.8–46.6)
Hemoglobin: 9.1 g/dL — ABNORMAL LOW (ref 11.6–15.9)
LYMPHS ABS: 1 10*3/uL (ref 0.9–3.3)
LYMPHS PCT: 30 %
MCH: 36.7 pg — AB (ref 25.1–34.0)
MCHC: 33.5 g/dL (ref 31.5–36.0)
MCV: 109.7 fL — AB (ref 79.5–101.0)
MONOS PCT: 16 %
Monocytes Absolute: 0.5 10*3/uL (ref 0.1–0.9)
NEUTROS ABS: 1.7 10*3/uL (ref 1.5–6.5)
NEUTROS PCT: 50 %
Platelets: 143 10*3/uL — ABNORMAL LOW (ref 145–400)
RBC: 2.48 MIL/uL — AB (ref 3.70–5.45)
RDW: 13.5 % (ref 11.2–14.5)
WBC: 3.3 10*3/uL — AB (ref 3.9–10.3)

## 2017-11-21 LAB — FERRITIN: FERRITIN: 204 ng/mL (ref 11–307)

## 2017-11-21 LAB — IRON AND TIBC
Iron: 50 ug/dL (ref 41–142)
Saturation Ratios: 20 % — ABNORMAL LOW (ref 21–57)
TIBC: 244 ug/dL (ref 236–444)
UIBC: 194 ug/dL

## 2017-11-21 LAB — VITAMIN B12: VITAMIN B 12: 2041 pg/mL — AB (ref 180–914)

## 2017-11-21 MED ORDER — IMATINIB MESYLATE 100 MG PO TABS: 300.0000 mg | ORAL_TABLET | Freq: Every day | ORAL | 3 refills | Status: DC

## 2017-11-21 NOTE — Telephone Encounter (Signed)
Per 7/25 los.  Lab add on done.

## 2017-11-22 ENCOUNTER — Telehealth: Payer: Self-pay | Admitting: *Deleted

## 2017-11-22 ENCOUNTER — Encounter: Payer: Self-pay | Admitting: Hematology and Oncology

## 2017-11-22 NOTE — Assessment & Plan Note (Addendum)
She was noted to have significant worsening pancytopenia, likely due to stress from recent surgery Repeat CBC has improved Serum iron studies and B12 were adequate She is not symptomatic.  Observe closely

## 2017-11-22 NOTE — Assessment & Plan Note (Signed)
Her most recent blood work show undetectable BCR/ABL I recommend she continues treatment at 300 mg daily  I plan to repeat her blood work again at the end of the year but the patient will call with

## 2017-11-22 NOTE — Progress Notes (Signed)
[facility] OFFICE PROGRESS NOTE  Patient Care Team: Binnie Rail, MD as PCP - General (Internal Medicine)  ASSESSMENT & PLAN:  Chronic myeloid leukemia Her most recent blood work show undetectable BCR/ABL I recommend she continues treatment at 300 mg daily  I plan to repeat her blood work again at the end of the year but the patient will call with   Pancytopenia due to antineoplastic chemotherapy [facility]) She was noted to have significant worsening pancytopenia, likely due to stress from recent surgery Repeat CBC has improved Serum iron studies and B12 were adequate She is not symptomatic.  Observe closely  History of breast cancer Her recent mammogram was negative for recurrence.   Orders Placed This Encounter  Procedures  . Vitamin B12    Standing Status:   Future    Number of Occurrences:   1    Standing Expiration Date:   12/26/2018  . Ferritin    Standing Status:   Future    Number of Occurrences:   1    Standing Expiration Date:   12/26/2018  . Iron and TIBC    Standing Status:   Future    Number of Occurrences:   1    Standing Expiration Date:   12/26/2018  . CBC with Differential/Platelet    Standing Status:   Future    Number of Occurrences:   1    Standing Expiration Date:   12/26/2018    INTERVAL HISTORY: Please see below for problem oriented charting. She returns for further follow-up She had recent fall causing injuries to the left shoulder and elbow requiring surgery She had blood work done a day before the surgery and was noted to have mild worsening pancytopenia She is not symptomatic.  Denies recent shortness of breath, dizziness or chest pain The patient denies any recent signs or symptoms of bleeding such as spontaneous epistaxis, hematuria or hematochezia. She denies recent infection, fever or chills She denies any recent abnormal breast examination, palpable mass, abnormal breast appearance or nipple changes   SUMMARY OF ONCOLOGIC  HISTORY:   Chronic myeloid leukemia ([place])   02/18/2007 Initial Diagnosis    Chronic myeloid leukemia      08/20/2014 Tumor Marker    BCR/ABL is undetectable      03/04/2015 Pathology Results    BCR/ABL is undetectable      08/29/2015 Tumor Marker    BCR/ABL is undetectable      02/29/2016 Pathology Results    BCR/ABL is undetectable      05/01/2016 Pathology Results    BCR/ABL b2a2 is detected at 0.03% and IS ratio 0.069%      05/31/2016 Pathology Results    BCR/ABL b2a2 is detected at 0.12% and IS ratio 0.276%      06/19/2016 Pathology Results    BCR/ABL b2a2 is detected at 0.24% and IS ratio 0.552%      06/19/2016 Miscellaneous    ABL mutation kinase testing is negative      06/27/2016 Miscellaneous    Dose of Gleevec is increased to 600 mg daily      07/25/2016 Pathology Results    BCR/ABL b2a2 is detected at 0.03% and IS ratio 0.069%. She has achieved MMR      08/30/2016 Miscellaneous    Dose of Gleevec is reduced to 300 mg daily and subsequently down to 200 mg daily      10/22/2016 Pathology Results    BCR/ABL b2a2 is not detected. She has achieved MMR  01/03/2017 Pathology Results    BCR/ABL b2a2 is detected IS ratio 0.041%      02/18/2017 Pathology Results    BCR/ABL b2a2 is detected IS ratio 0.0126      05/21/2017 Pathology Results    BCR/ABL b2a2 is not detected. She has achieved MMR      08/20/2017 Pathology Results    BCR/ABL b2a2 is not detected. She has achieved MMR       REVIEW OF SYSTEMS:   Constitutional: Denies fevers, chills or abnormal weight loss Eyes: Denies blurriness of vision Ears, nose, mouth, throat, and face: Denies mucositis or sore throat Respiratory: Denies cough, dyspnea or wheezes Cardiovascular: Denies palpitation, chest discomfort or lower extremity swelling Gastrointestinal:  Denies nausea, heartburn or change in bowel habits Skin: Denies abnormal skin rashes Lymphatics: Denies new lymphadenopathy or easy  bruising Neurological:Denies numbness, tingling or new weaknesses Behavioral/Psych: Mood is stable, no new changes  All other systems were reviewed with the patient and are negative.  I have reviewed the past medical history, past surgical history, social history and family history with the patient and they are unchanged from previous note.  ALLERGIES:  is allergic to penicillins.  MEDICATIONS:  Current Outpatient Medications  Medication Sig Dispense Refill  . aspirin 81 MG tablet Take 81 mg by mouth daily.    . Biotin 10000 MCG TABS Take 0.25 tablets by mouth daily.    . Calcium Carb-Cholecalciferol (CALCIUM 600 + D PO) Take 1 tablet by mouth daily.    . Cyanocobalamin (B-12) 5000 MCG CAPS Take 1 tablet by mouth daily.    Marland Kitchen doxycycline (VIBRA-TABS) 100 MG tablet Take 1 tablet (100 mg total) by mouth 2 (two) times daily. 20 tablet 0  . estradiol (ESTRACE VAGINAL) 0.1 MG/GM vaginal cream Place 1 Applicatorful vaginally 2 (two) times a week. 42.5 g 8  . FINACEA 15 % cream Apply 1 application topically daily.  2  . folic acid (FOLVITE) 1 MG tablet TAKE 1 TABLET(1 MG) BY MOUTH DAILY 90 tablet 3  . Glucosamine-Chondroit-Vit C-Mn (GLUCOSAMINE CHONDR 1500 COMPLX) CAPS Take 1 capsule by mouth daily.    Marland Kitchen ibuprofen (ADVIL,MOTRIN) 200 MG tablet Take 400 mg by mouth every 6 (six) hours as needed for moderate pain.     Marland Kitchen imatinib (GLEEVEC) 100 MG tablet Take 3 tablets (300 mg total) by mouth daily. 270 tablet 3  . indomethacin (INDOCIN) 50 MG capsule Take 1 capsule (50 mg total) by mouth 3 (three) times daily with meals. 15 capsule 0  . Loperamide HCl (IMODIUM PO) Take 0.5-1 tablets by mouth 2 (two) times daily as needed (takes 0.5 tablet every morning and 2nd dose if needed for diarrhea).     . Multiple Vitamin (MULTIVITAMIN) tablet Take 1 tablet by mouth daily.    . Probiotic Product (ALIGN) 4 MG CAPS Take 1 capsule by mouth daily.     No current facility-administered medications for this visit.      PHYSICAL EXAMINATION: ECOG PERFORMANCE STATUS: 1 - Symptomatic but completely ambulatory  Vitals:   11/21/17 1226  BP: (!) 155/95  Pulse: 87  Resp: 18  Temp: 98.4 F (36.9 C)  SpO2: 99%   Filed Weights   11/21/17 1226  Weight: 120 lb (54.4 kg)    GENERAL:alert, no distress and comfortable SKIN: skin color, texture, turgor are normal, no rashes or significant lesions EYES: normal, Conjunctiva are pink and non-injected, sclera clear OROPHARYNX:no exudate, no erythema and lips, buccal mucosa, and tongue normal  NECK: supple,  thyroid normal size, non-tender, without nodularity LYMPH:  no palpable lymphadenopathy in the cervical, axillary or inguinal LUNGS: clear to auscultation and percussion with normal breathing effort HEART: regular rate & rhythm and no murmurs and no lower extremity edema ABDOMEN:abdomen soft, non-tender and normal bowel sounds Musculoskeletal:no cyanosis of digits and no clubbing.  She is wearing a sling over the left shoulder NEURO: alert & oriented x 3 with fluent speech, no focal motor/sensory deficits  LABORATORY DATA:  I have reviewed the data as listed    Component Value Date/Time   NA 136 11/11/2017 1219   NA 140 02/18/2017 0808   K 4.5 11/11/2017 1219   K 4.4 02/18/2017 0808   CL 102 11/11/2017 1219   CL 105 09/04/2012 1006   CO2 29 11/11/2017 1219   CO2 29 02/18/2017 0808   GLUCOSE 99 11/11/2017 1219   GLUCOSE 86 02/18/2017 0808   GLUCOSE 96 09/04/2012 1006   BUN 19 11/11/2017 1219   BUN 20.3 02/18/2017 0808   CREATININE 1.00 11/11/2017 1219   CREATININE 1.1 02/18/2017 0808   CALCIUM 9.1 11/11/2017 1219   CALCIUM 9.4 02/18/2017 0808   PROT 6.2 (L) 11/11/2017 1219   PROT 6.6 02/18/2017 0808   ALBUMIN 4.0 11/11/2017 1219   ALBUMIN 4.2 02/18/2017 0808   AST 32 11/11/2017 1219   AST 34 02/18/2017 0808   ALT 19 11/11/2017 1219   ALT 18 02/18/2017 0808   ALKPHOS 50 11/11/2017 1219   ALKPHOS 53 02/18/2017 0808   BILITOT 0.5  11/11/2017 1219   BILITOT 0.76 02/18/2017 0808   GFRNONAA 51 (L) 11/11/2017 1219   GFRAA 60 (L) 11/11/2017 1219    No results found for: SPEP, UPEP  Lab Results  Component Value Date   WBC 3.3 (L) 11/21/2017   NEUTROABS 1.7 11/21/2017   HGB 9.1 (L) 11/21/2017   HCT 27.2 (L) 11/21/2017   MCV 109.7 (H) 11/21/2017   PLT 143 (L) 11/21/2017      Chemistry      Component Value Date/Time   NA 136 11/11/2017 1219   NA 140 02/18/2017 0808   K 4.5 11/11/2017 1219   K 4.4 02/18/2017 0808   CL 102 11/11/2017 1219   CL 105 09/04/2012 1006   CO2 29 11/11/2017 1219   CO2 29 02/18/2017 0808   BUN 19 11/11/2017 1219   BUN 20.3 02/18/2017 0808   CREATININE 1.00 11/11/2017 1219   CREATININE 1.1 02/18/2017 0808      Component Value Date/Time   CALCIUM 9.1 11/11/2017 1219   CALCIUM 9.4 02/18/2017 0808   ALKPHOS 50 11/11/2017 1219   ALKPHOS 53 02/18/2017 0808   AST 32 11/11/2017 1219   AST 34 02/18/2017 0808   ALT 19 11/11/2017 1219   ALT 18 02/18/2017 0808   BILITOT 0.5 11/11/2017 1219   BILITOT 0.76 02/18/2017 0808     All questions were answered. The patient knows to call the clinic with any problems, questions or concerns. No barriers to learning was detected.  I spent 15 minutes counseling the patient face to face. The total time spent in the appointment was 20 minutes and more than 50% was on counseling and review of test results  Heath Lark, MD 11/22/2017 9:21 AM

## 2017-11-22 NOTE — Telephone Encounter (Signed)
-----   Message from Heath Lark, MD sent at 11/22/2017  7:56 AM EDT ----- Regarding: iron and B12 ok pls let her know repeat CBC, iron and B12 is ok ----- Message ----- From: Interface, Lab In [facility] Sent: 11/21/2017   1:14 PM To: Heath Lark, MD

## 2017-11-22 NOTE — Assessment & Plan Note (Signed)
Her recent mammogram was negative for recurrence. 

## 2017-11-22 NOTE — Telephone Encounter (Signed)
Notified of message below

## 2017-11-29 LAB — BCR/ABL

## 2018-02-20 ENCOUNTER — Encounter: Payer: Self-pay | Admitting: Internal Medicine

## 2018-03-03 ENCOUNTER — Telehealth: Payer: Self-pay

## 2018-03-03 NOTE — Telephone Encounter (Signed)
She called and left a message to move appt to November.  Called back and told Dr. Alvy Bimler on medical leave currently. Will be back in December. She will leave appts as scheduled.

## 2018-04-07 ENCOUNTER — Encounter: Payer: Self-pay | Admitting: Hematology and Oncology

## 2018-04-07 ENCOUNTER — Inpatient Hospital Stay: Payer: Medicare Other | Attending: Hematology and Oncology

## 2018-04-07 DIAGNOSIS — Z79899 Other long term (current) drug therapy: Secondary | ICD-10-CM | POA: Insufficient documentation

## 2018-04-07 DIAGNOSIS — C9210 Chronic myeloid leukemia, BCR/ABL-positive, not having achieved remission: Secondary | ICD-10-CM | POA: Insufficient documentation

## 2018-04-07 DIAGNOSIS — Z23 Encounter for immunization: Secondary | ICD-10-CM | POA: Diagnosis not present

## 2018-04-07 DIAGNOSIS — Z853 Personal history of malignant neoplasm of breast: Secondary | ICD-10-CM | POA: Insufficient documentation

## 2018-04-07 DIAGNOSIS — Z7982 Long term (current) use of aspirin: Secondary | ICD-10-CM | POA: Insufficient documentation

## 2018-04-07 LAB — CBC WITH DIFFERENTIAL/PLATELET
ABS IMMATURE GRANULOCYTES: 0 10*3/uL (ref 0.00–0.07)
BASOS ABS: 0 10*3/uL (ref 0.0–0.1)
BASOS PCT: 1 %
Eosinophils Absolute: 0 10*3/uL (ref 0.0–0.5)
Eosinophils Relative: 1 %
HCT: 29.1 % — ABNORMAL LOW (ref 36.0–46.0)
HEMOGLOBIN: 9.9 g/dL — AB (ref 12.0–15.0)
Immature Granulocytes: 0 %
LYMPHS PCT: 30 %
Lymphs Abs: 0.9 10*3/uL (ref 0.7–4.0)
MCH: 39 pg — ABNORMAL HIGH (ref 26.0–34.0)
MCHC: 34 g/dL (ref 30.0–36.0)
MCV: 114.6 fL — ABNORMAL HIGH (ref 80.0–100.0)
Monocytes Absolute: 0.4 10*3/uL (ref 0.1–1.0)
Monocytes Relative: 14 %
NEUTROS ABS: 1.7 10*3/uL (ref 1.7–7.7)
NEUTROS PCT: 54 %
NRBC: 0 % (ref 0.0–0.2)
PLATELETS: 146 10*3/uL — AB (ref 150–400)
RBC: 2.54 MIL/uL — AB (ref 3.87–5.11)
RDW: 12.9 % (ref 11.5–15.5)
WBC: 3 10*3/uL — AB (ref 4.0–10.5)

## 2018-04-07 LAB — COMPREHENSIVE METABOLIC PANEL
ALT: 15 U/L (ref 0–44)
ANION GAP: 9 (ref 5–15)
AST: 30 U/L (ref 15–41)
Albumin: 4.1 g/dL (ref 3.5–5.0)
Alkaline Phosphatase: 65 U/L (ref 38–126)
BUN: 21 mg/dL (ref 8–23)
CO2: 28 mmol/L (ref 22–32)
Calcium: 9.4 mg/dL (ref 8.9–10.3)
Chloride: 104 mmol/L (ref 98–111)
Creatinine, Ser: 1.04 mg/dL — ABNORMAL HIGH (ref 0.44–1.00)
GFR, EST AFRICAN AMERICAN: 58 mL/min — AB (ref >60)
GFR, EST NON AFRICAN AMERICAN: 50 mL/min — AB (ref >60)
Glucose, Bld: 101 mg/dL — ABNORMAL HIGH (ref 70–99)
POTASSIUM: 4.1 mmol/L (ref 3.5–5.1)
Sodium: 141 mmol/L (ref 135–145)
Total Bilirubin: 0.6 mg/dL (ref 0.3–1.2)
Total Protein: 6.8 g/dL (ref 6.5–8.1)

## 2018-04-15 ENCOUNTER — Encounter: Payer: Self-pay | Admitting: Hematology and Oncology

## 2018-04-15 ENCOUNTER — Inpatient Hospital Stay (HOSPITAL_BASED_OUTPATIENT_CLINIC_OR_DEPARTMENT_OTHER): Payer: Medicare Other | Admitting: Hematology and Oncology

## 2018-04-15 ENCOUNTER — Telehealth: Payer: Self-pay | Admitting: Hematology and Oncology

## 2018-04-15 VITALS — BP 114/57 | HR 84 | Temp 98.0°F | Resp 18 | Ht 61.0 in | Wt 119.6 lb

## 2018-04-15 DIAGNOSIS — C9210 Chronic myeloid leukemia, BCR/ABL-positive, not having achieved remission: Secondary | ICD-10-CM | POA: Diagnosis not present

## 2018-04-15 DIAGNOSIS — Z7982 Long term (current) use of aspirin: Secondary | ICD-10-CM | POA: Diagnosis not present

## 2018-04-15 DIAGNOSIS — Z23 Encounter for immunization: Secondary | ICD-10-CM

## 2018-04-15 DIAGNOSIS — Z853 Personal history of malignant neoplasm of breast: Secondary | ICD-10-CM

## 2018-04-15 DIAGNOSIS — Z79899 Other long term (current) drug therapy: Secondary | ICD-10-CM

## 2018-04-15 DIAGNOSIS — D61810 Antineoplastic chemotherapy induced pancytopenia: Secondary | ICD-10-CM

## 2018-04-15 DIAGNOSIS — T451X5A Adverse effect of antineoplastic and immunosuppressive drugs, initial encounter: Secondary | ICD-10-CM

## 2018-04-15 MED ORDER — INFLUENZA VAC SPLIT QUAD 0.5 ML IM SUSY
0.5000 mL | PREFILLED_SYRINGE | Freq: Once | INTRAMUSCULAR | Status: AC
  Administered 2018-04-15: 0.5 mL via INTRAMUSCULAR

## 2018-04-15 MED ORDER — INFLUENZA VAC SPLIT QUAD 0.5 ML IM SUSY
PREFILLED_SYRINGE | INTRAMUSCULAR | Status: AC
  Filled 2018-04-15: qty 0.5

## 2018-04-15 NOTE — Telephone Encounter (Signed)
Patient needed around 3/10 appt.  Patient given avs and calendar.

## 2018-04-15 NOTE — Progress Notes (Signed)
[place] OFFICE PROGRESS NOTE  Patient Care Team: Binnie Rail, MD as PCP - General (Internal Medicine)  ASSESSMENT & PLAN:  Chronic myeloid leukemia Her most recent blood work showed low level of detectable BCR/ABL but she remained in major molecular response I recommend she continues treatment at 300 mg daily  I plan to see her again in 3 months for further follow-up and repeat blood work  We discussed the importance of preventive care and reviewed the vaccination programs. She does not have any prior allergic reactions to influenza vaccination. She agrees to proceed with influenza vaccination today and we will administer it today at the clinic.   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  Observe closely  History of breast cancer Her recent mammogram was negative for recurrence.   No orders of the defined types were placed in this encounter.   INTERVAL HISTORY: Please see below for problem oriented charting. She returns for further follow-up She denies recent infection, fever or chills Unfortunately, the patient had a recent fall but denies major injuries requiring surgery She tolerated chemotherapy well without significant diarrhea or shortness of breath She denies any recent abnormal breast examination, palpable mass, abnormal breast appearance or nipple changes She have no difficulties getting her prescription filled  SUMMARY OF ONCOLOGIC HISTORY:   Chronic myeloid leukemia ([place])   02/18/2007 Initial Diagnosis    Chronic myeloid leukemia    08/20/2014 Tumor Marker    BCR/ABL is undetectable    03/04/2015 Pathology Results    BCR/ABL is undetectable    08/29/2015 Tumor Marker    BCR/ABL is undetectable    02/29/2016 Pathology Results    BCR/ABL is undetectable    05/01/2016 Pathology Results    BCR/ABL b2a2 is detected at 0.03% and IS ratio 0.069%    05/31/2016 Pathology Results    BCR/ABL b2a2 is detected at 0.12% and IS ratio 0.276%    06/19/2016 Pathology Results    BCR/ABL b2a2 is detected at 0.24% and IS ratio 0.552%    06/19/2016 Miscellaneous    ABL mutation kinase testing is negative    06/27/2016 Miscellaneous    Dose of Gleevec is increased to 600 mg daily    07/25/2016 Pathology Results    BCR/ABL b2a2 is detected at 0.03% and IS ratio 0.069%. She has achieved MMR    08/30/2016 Miscellaneous    Dose of Gleevec is reduced to 300 mg daily and subsequently down to 200 mg daily    10/22/2016 Pathology Results    BCR/ABL b2a2 is not detected. She has achieved MMR    01/03/2017 Pathology Results    BCR/ABL b2a2 is detected IS ratio 0.041%    02/18/2017 Pathology Results    BCR/ABL b2a2 is detected IS ratio 0.0126    05/21/2017 Pathology Results    BCR/ABL b2a2 is not detected. She has achieved MMR    08/20/2017 Pathology Results    BCR/ABL b2a2 is not detected. She has achieved MMR    11/12/2017 Pathology Results    BCR/ABL b2a2 is not detected    04/07/2018 Pathology Results    BCR/ABL b2a2 is detected IS ratio 0.0062. She is in MMR     REVIEW OF SYSTEMS:   Constitutional: Denies fevers, chills or abnormal weight loss Eyes: Denies blurriness of vision Ears, nose, mouth, throat, and face: Denies mucositis or sore throat Respiratory: Denies cough, dyspnea or wheezes Cardiovascular:  Denies palpitation, chest discomfort or lower extremity swelling Gastrointestinal:  Denies nausea, heartburn or change in bowel habits Skin: Denies abnormal skin rashes Lymphatics: Denies new lymphadenopathy or easy bruising Neurological:Denies numbness, tingling or new weaknesses Behavioral/Psych: Mood is stable, no new changes  All other systems were reviewed with the patient and are negative.  I have reviewed the past medical history, past surgical history, social history and family history with the patient and they are unchanged from  previous note.  ALLERGIES:  is allergic to penicillins.  MEDICATIONS:  Current Outpatient Medications  Medication Sig Dispense Refill  . aspirin 81 MG tablet Take 81 mg by mouth daily.    . Biotin 10000 MCG TABS Take 0.25 tablets by mouth daily.    . Calcium Carb-Cholecalciferol (CALCIUM 600 + D PO) Take 1 tablet by mouth daily.    . Cyanocobalamin (B-12) 5000 MCG CAPS Take 1 tablet by mouth daily.    Marland Kitchen estradiol (ESTRACE VAGINAL) 0.1 MG/GM vaginal cream Place 1 Applicatorful vaginally 2 (two) times a week. 42.5 g 8  . FINACEA 15 % cream Apply 1 application topically daily.  2  . folic acid (FOLVITE) 1 MG tablet TAKE 1 TABLET(1 MG) BY MOUTH DAILY 90 tablet 3  . Glucosamine-Chondroit-Vit C-Mn (GLUCOSAMINE CHONDR 1500 COMPLX) CAPS Take 1 capsule by mouth daily.    Marland Kitchen ibuprofen (ADVIL,MOTRIN) 200 MG tablet Take 400 mg by mouth every 6 (six) hours as needed for moderate pain.     Marland Kitchen imatinib (GLEEVEC) 100 MG tablet Take 3 tablets (300 mg total) by mouth daily. 270 tablet 3  . indomethacin (INDOCIN) 50 MG capsule Take 1 capsule (50 mg total) by mouth 3 (three) times daily with meals. 15 capsule 0  . Loperamide HCl (IMODIUM PO) Take 0.5-1 tablets by mouth 2 (two) times daily as needed (takes 0.5 tablet every morning and 2nd dose if needed for diarrhea).     . Multiple Vitamin (MULTIVITAMIN) tablet Take 1 tablet by mouth daily.    . Probiotic Product (ALIGN) 4 MG CAPS Take 1 capsule by mouth daily.     No current facility-administered medications for this visit.     PHYSICAL EXAMINATION: ECOG PERFORMANCE STATUS: 1 - Symptomatic but completely ambulatory  Vitals:   04/15/18 1109  BP: (!) 114/57  Pulse: 84  Resp: 18  Temp: 98 F (36.7 C)  SpO2: 100%   Filed Weights   04/15/18 1109  Weight: 119 lb 9.6 oz (54.3 kg)    GENERAL:alert, no distress and comfortable.  She has a sling over her right shoulder SKIN: skin color, texture, turgor are normal, no rashes or significant lesions EYES:  normal, Conjunctiva are pink and non-injected, sclera clear OROPHARYNX:no exudate, no erythema and lips, buccal mucosa, and tongue normal  NECK: supple, thyroid normal size, non-tender, without nodularity LYMPH:  no palpable lymphadenopathy in the cervical, axillary or inguinal LUNGS: clear to auscultation and percussion with normal breathing effort HEART: regular rate & rhythm and no murmurs and no lower extremity edema ABDOMEN:abdomen soft, non-tender and normal bowel sounds Musculoskeletal:no cyanosis of digits and no clubbing  NEURO: alert & oriented x 3 with fluent speech, no focal motor/sensory deficits  LABORATORY DATA:  I have reviewed the data as listed    Component Value Date/Time   NA 141 04/07/2018 1058   NA 140 02/18/2017 0808   K 4.1 04/07/2018 1058   K 4.4 02/18/2017 0808   CL 104 04/07/2018 1058   CL 105 09/04/2012 1006   CO2  28 04/07/2018 1058   CO2 29 02/18/2017 0808   GLUCOSE 101 (H) 04/07/2018 1058   GLUCOSE 86 02/18/2017 0808   GLUCOSE 96 09/04/2012 1006   BUN 21 04/07/2018 1058   BUN 20.3 02/18/2017 0808   CREATININE 1.04 (H) 04/07/2018 1058   CREATININE 1.1 02/18/2017 0808   CALCIUM 9.4 04/07/2018 1058   CALCIUM 9.4 02/18/2017 0808   PROT 6.8 04/07/2018 1058   PROT 6.6 02/18/2017 0808   ALBUMIN 4.1 04/07/2018 1058   ALBUMIN 4.2 02/18/2017 0808   AST 30 04/07/2018 1058   AST 34 02/18/2017 0808   ALT 15 04/07/2018 1058   ALT 18 02/18/2017 0808   ALKPHOS 65 04/07/2018 1058   ALKPHOS 53 02/18/2017 0808   BILITOT 0.6 04/07/2018 1058   BILITOT 0.76 02/18/2017 0808   GFRNONAA 50 (L) 04/07/2018 1058   GFRAA 58 (L) 04/07/2018 1058    No results found for: SPEP, UPEP  Lab Results  Component Value Date   WBC 3.0 (L) 04/07/2018   NEUTROABS 1.7 04/07/2018   HGB 9.9 (L) 04/07/2018   HCT 29.1 (L) 04/07/2018   MCV 114.6 (H) 04/07/2018   PLT 146 (L) 04/07/2018      Chemistry      Component Value Date/Time   NA 141 04/07/2018 1058   NA 140  02/18/2017 0808   K 4.1 04/07/2018 1058   K 4.4 02/18/2017 0808   CL 104 04/07/2018 1058   CL 105 09/04/2012 1006   CO2 28 04/07/2018 1058   CO2 29 02/18/2017 0808   BUN 21 04/07/2018 1058   BUN 20.3 02/18/2017 0808   CREATININE 1.04 (H) 04/07/2018 1058   CREATININE 1.1 02/18/2017 0808      Component Value Date/Time   CALCIUM 9.4 04/07/2018 1058   CALCIUM 9.4 02/18/2017 0808   ALKPHOS 65 04/07/2018 1058   ALKPHOS 53 02/18/2017 0808   AST 30 04/07/2018 1058   AST 34 02/18/2017 0808   ALT 15 04/07/2018 1058   ALT 18 02/18/2017 0808   BILITOT 0.6 04/07/2018 1058   BILITOT 0.76 02/18/2017 0808       All questions were answered. The patient knows to call the clinic with any problems, questions or concerns. No barriers to learning was detected.  I spent 15 minutes counseling the patient face to face. The total time spent in the appointment was 20 minutes and more than 50% was on counseling and review of test results  Heath Lark, MD 04/15/2018 11:40 AM

## 2018-04-15 NOTE — Assessment & Plan Note (Signed)
Her recent mammogram was negative for recurrence.

## 2018-04-15 NOTE — Assessment & Plan Note (Addendum)
Her most recent blood work showed low level of detectable BCR/ABL but she remained in major molecular response I recommend she continues treatment at 300 mg daily  I plan to see her again in 3 months for further follow-up and repeat blood work  We discussed the importance of preventive care and reviewed the vaccination programs. She does not have any prior allergic reactions to influenza vaccination. She agrees to proceed with influenza vaccination today and we will administer it today at the clinic.

## 2018-04-15 NOTE — Telephone Encounter (Signed)
Per 12/17 los.  Patient asked for particular dates.

## 2018-04-15 NOTE — Assessment & Plan Note (Signed)
Repeat CBC is stable Serum iron studies and B12 were adequate She is not symptomatic.  Observe closely

## 2018-04-17 LAB — BCR/ABL

## 2018-06-20 LAB — HM MAMMOGRAPHY

## 2018-06-27 ENCOUNTER — Other Ambulatory Visit: Payer: Medicare Other

## 2018-06-27 ENCOUNTER — Inpatient Hospital Stay: Payer: Medicare Other | Attending: Hematology and Oncology

## 2018-06-27 DIAGNOSIS — C9210 Chronic myeloid leukemia, BCR/ABL-positive, not having achieved remission: Secondary | ICD-10-CM | POA: Diagnosis present

## 2018-06-27 DIAGNOSIS — Z853 Personal history of malignant neoplasm of breast: Secondary | ICD-10-CM | POA: Insufficient documentation

## 2018-06-27 DIAGNOSIS — Z79899 Other long term (current) drug therapy: Secondary | ICD-10-CM | POA: Insufficient documentation

## 2018-06-27 DIAGNOSIS — Z7982 Long term (current) use of aspirin: Secondary | ICD-10-CM | POA: Diagnosis not present

## 2018-06-27 LAB — COMPREHENSIVE METABOLIC PANEL
ALT: 18 U/L (ref 0–44)
AST: 37 U/L (ref 15–41)
Albumin: 4.1 g/dL (ref 3.5–5.0)
Alkaline Phosphatase: 56 U/L (ref 38–126)
Anion gap: 9 (ref 5–15)
BUN: 20 mg/dL (ref 8–23)
CHLORIDE: 103 mmol/L (ref 98–111)
CO2: 28 mmol/L (ref 22–32)
CREATININE: 1.09 mg/dL — AB (ref 0.44–1.00)
Calcium: 9.1 mg/dL (ref 8.9–10.3)
GFR calc non Af Amer: 48 mL/min — ABNORMAL LOW (ref >60)
GFR, EST AFRICAN AMERICAN: 55 mL/min — AB (ref >60)
Glucose, Bld: 82 mg/dL (ref 70–99)
Potassium: 4.5 mmol/L (ref 3.5–5.1)
SODIUM: 140 mmol/L (ref 135–145)
Total Bilirubin: 0.6 mg/dL (ref 0.3–1.2)
Total Protein: 6.4 g/dL — ABNORMAL LOW (ref 6.5–8.1)

## 2018-06-27 LAB — CBC WITH DIFFERENTIAL/PLATELET
Abs Immature Granulocytes: 0.01 10*3/uL (ref 0.00–0.07)
BASOS ABS: 0 10*3/uL (ref 0.0–0.1)
BASOS PCT: 1 %
Eosinophils Absolute: 0.1 10*3/uL (ref 0.0–0.5)
Eosinophils Relative: 3 %
HCT: 30.1 % — ABNORMAL LOW (ref 36.0–46.0)
Hemoglobin: 10.1 g/dL — ABNORMAL LOW (ref 12.0–15.0)
IMMATURE GRANULOCYTES: 0 %
Lymphocytes Relative: 43 %
Lymphs Abs: 1.4 10*3/uL (ref 0.7–4.0)
MCH: 38 pg — AB (ref 26.0–34.0)
MCHC: 33.6 g/dL (ref 30.0–36.0)
MCV: 113.2 fL — ABNORMAL HIGH (ref 80.0–100.0)
Monocytes Absolute: 0.4 10*3/uL (ref 0.1–1.0)
Monocytes Relative: 13 %
NEUTROS ABS: 1.4 10*3/uL — AB (ref 1.7–7.7)
NEUTROS PCT: 40 %
NRBC: 0 % (ref 0.0–0.2)
PLATELETS: 128 10*3/uL — AB (ref 150–400)
RBC: 2.66 MIL/uL — AB (ref 3.87–5.11)
RDW: 13.8 % (ref 11.5–15.5)
WBC: 3.3 10*3/uL — AB (ref 4.0–10.5)

## 2018-07-01 ENCOUNTER — Encounter: Payer: Self-pay | Admitting: Internal Medicine

## 2018-07-08 ENCOUNTER — Encounter: Payer: Self-pay | Admitting: Hematology and Oncology

## 2018-07-08 ENCOUNTER — Inpatient Hospital Stay: Payer: Medicare Other | Attending: Hematology and Oncology | Admitting: Hematology and Oncology

## 2018-07-08 DIAGNOSIS — Z79899 Other long term (current) drug therapy: Secondary | ICD-10-CM | POA: Diagnosis not present

## 2018-07-08 DIAGNOSIS — Z7982 Long term (current) use of aspirin: Secondary | ICD-10-CM

## 2018-07-08 DIAGNOSIS — C9210 Chronic myeloid leukemia, BCR/ABL-positive, not having achieved remission: Secondary | ICD-10-CM

## 2018-07-08 DIAGNOSIS — D61810 Antineoplastic chemotherapy induced pancytopenia: Secondary | ICD-10-CM

## 2018-07-08 DIAGNOSIS — T451X5D Adverse effect of antineoplastic and immunosuppressive drugs, subsequent encounter: Secondary | ICD-10-CM | POA: Insufficient documentation

## 2018-07-08 DIAGNOSIS — Z853 Personal history of malignant neoplasm of breast: Secondary | ICD-10-CM | POA: Diagnosis not present

## 2018-07-08 DIAGNOSIS — Z791 Long term (current) use of non-steroidal anti-inflammatories (NSAID): Secondary | ICD-10-CM

## 2018-07-08 DIAGNOSIS — T451X5A Adverse effect of antineoplastic and immunosuppressive drugs, initial encounter: Secondary | ICD-10-CM

## 2018-07-08 NOTE — Assessment & Plan Note (Signed)
Repeat CBC is stable Serum iron studies and B12 were adequate She is not symptomatic.  Observe closely

## 2018-07-08 NOTE — Progress Notes (Signed)
[facility] OFFICE PROGRESS NOTE  Patient Care Team: Binnie Rail, MD as PCP - General (Internal Medicine)  ASSESSMENT & PLAN:  Chronic myeloid leukemia Her most recent blood work showed low level of detectable BCR/ABL but she remained in major molecular response I recommend she continues treatment at 300 mg daily  The patient will be in [place] in 3 months.  She has arranged for local laboratory to get her blood done and fax the result to me.  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  Observe closely  History of breast cancer Her recent mammogram was negative for recurrence.   No orders of the defined types were placed in this encounter.   INTERVAL HISTORY: Please see below for problem oriented charting. She returns for further follow-up She feels well Denies recent infection, fever or chills The patient denies any recent signs or symptoms of bleeding such as spontaneous epistaxis, hematuria or hematochezia. She denies diarrhea or fluid retention She has no difficulties getting her prescription filled  SUMMARY OF ONCOLOGIC HISTORY:   Chronic myeloid leukemia ([place])   02/18/2007 Initial Diagnosis    Chronic myeloid leukemia    08/20/2014 Tumor Marker    BCR/ABL is undetectable    03/04/2015 Pathology Results    BCR/ABL is undetectable    08/29/2015 Tumor Marker    BCR/ABL is undetectable    02/29/2016 Pathology Results    BCR/ABL is undetectable    05/01/2016 Pathology Results    BCR/ABL b2a2 is detected at 0.03% and IS ratio 0.069%    05/31/2016 Pathology Results    BCR/ABL b2a2 is detected at 0.12% and IS ratio 0.276%    06/19/2016 Pathology Results    BCR/ABL b2a2 is detected at 0.24% and IS ratio 0.552%    06/19/2016 Miscellaneous    ABL mutation  kinase testing is negative    06/27/2016 Miscellaneous    Dose of Gleevec is increased to 600 mg daily    07/25/2016 Pathology Results    BCR/ABL b2a2 is detected at 0.03% and IS ratio 0.069%. She has achieved MMR    08/30/2016 Miscellaneous    Dose of Gleevec is reduced to 300 mg daily and subsequently down to 200 mg daily    10/22/2016 Pathology Results    BCR/ABL b2a2 is not detected. She has achieved MMR    01/03/2017 Pathology Results    BCR/ABL b2a2 is detected IS ratio 0.041%    02/18/2017 Pathology Results    BCR/ABL b2a2 is detected IS ratio 0.0126    05/21/2017 Pathology Results    BCR/ABL b2a2 is not detected. She has achieved MMR    08/20/2017 Pathology Results    BCR/ABL b2a2 is not detected. She has achieved MMR    11/12/2017 Pathology Results    BCR/ABL b2a2 is not detected    04/07/2018 Pathology Results    BCR/ABL b2a2 is detected IS ratio 0.0062. She is in MMR    06/27/2018 Pathology Results    BCR/ABL b2a2 is detected IS ratio 0.0097%. She is in MMR     REVIEW OF SYSTEMS:   Constitutional: Denies fevers, chills or abnormal weight loss Eyes: Denies blurriness of vision Ears, nose, mouth, throat, and face: Denies mucositis or sore throat Respiratory: Denies cough, dyspnea or wheezes Cardiovascular: Denies palpitation,  chest discomfort or lower extremity swelling Gastrointestinal:  Denies nausea, heartburn or change in bowel habits Skin: Denies abnormal skin rashes Lymphatics: Denies new lymphadenopathy or easy bruising Neurological:Denies numbness, tingling or new weaknesses Behavioral/Psych: Mood is stable, no new changes  All other systems were reviewed with the patient and are negative.  I have reviewed the past medical history, past surgical history, social history and family history with the patient and they are unchanged from previous note.  ALLERGIES:  is allergic to penicillins.  MEDICATIONS:  Current Outpatient Medications  Medication Sig Dispense  Refill  . aspirin 81 MG tablet Take 81 mg by mouth daily.    . Biotin 10000 MCG TABS Take 0.25 tablets by mouth daily.    . Calcium Carb-Cholecalciferol (CALCIUM 600 + D PO) Take 1 tablet by mouth daily.    . Cyanocobalamin (B-12) 5000 MCG CAPS Take 1 tablet by mouth daily.    Marland Kitchen estradiol (ESTRACE VAGINAL) 0.1 MG/GM vaginal cream Place 1 Applicatorful vaginally 2 (two) times a week. 42.5 g 8  . FINACEA 15 % cream Apply 1 application topically daily.  2  . folic acid (FOLVITE) 1 MG tablet TAKE 1 TABLET(1 MG) BY MOUTH DAILY 90 tablet 3  . Glucosamine-Chondroit-Vit C-Mn (GLUCOSAMINE CHONDR 1500 COMPLX) CAPS Take 1 capsule by mouth daily.    Marland Kitchen ibuprofen (ADVIL,MOTRIN) 200 MG tablet Take 400 mg by mouth every 6 (six) hours as needed for moderate pain.     Marland Kitchen imatinib (GLEEVEC) 100 MG tablet Take 3 tablets (300 mg total) by mouth daily. 270 tablet 3  . indomethacin (INDOCIN) 50 MG capsule Take 1 capsule (50 mg total) by mouth 3 (three) times daily with meals. 15 capsule 0  . Loperamide HCl (IMODIUM PO) Take 0.5-1 tablets by mouth 2 (two) times daily as needed (takes 0.5 tablet every morning and 2nd dose if needed for diarrhea).     . Multiple Vitamin (MULTIVITAMIN) tablet Take 1 tablet by mouth daily.    . Probiotic Product (ALIGN) 4 MG CAPS Take 1 capsule by mouth daily.     No current facility-administered medications for this visit.     PHYSICAL EXAMINATION: ECOG PERFORMANCE STATUS: 1 - Symptomatic but completely ambulatory  Vitals:   07/08/18 1016  BP: 112/72  Pulse: 87  Resp: 19  Temp: 98.1 F (36.7 C)  SpO2: 100%   Filed Weights   07/08/18 1016  Weight: 123 lb 12.8 oz (56.2 kg)    GENERAL:alert, no distress and comfortable SKIN: skin color, texture, turgor are normal, no rashes or significant lesions EYES: normal, Conjunctiva are pink and non-injected, sclera clear OROPHARYNX:no exudate, no erythema and lips, buccal mucosa, and tongue normal  NECK: supple, thyroid normal size,  non-tender, without nodularity LYMPH:  no palpable lymphadenopathy in the cervical, axillary or inguinal LUNGS: clear to auscultation and percussion with normal breathing effort HEART: regular rate & rhythm and no murmurs and no lower extremity edema ABDOMEN:abdomen soft, non-tender and normal bowel sounds Musculoskeletal:no cyanosis of digits and no clubbing  NEURO: alert & oriented x 3 with fluent speech, no focal motor/sensory deficits  LABORATORY DATA:  I have reviewed the data as listed    Component Value Date/Time   NA 140 06/27/2018 0752   NA 140 02/18/2017 0808   K 4.5 06/27/2018 0752   K 4.4 02/18/2017 0808   CL 103 06/27/2018 0752   CL 105 09/04/2012 1006   CO2 28 06/27/2018 0752   CO2 29 02/18/2017 0808   GLUCOSE  82 06/27/2018 0752   GLUCOSE 86 02/18/2017 0808   GLUCOSE 96 09/04/2012 1006   BUN 20 06/27/2018 0752   BUN 20.3 02/18/2017 0808   CREATININE 1.09 (H) 06/27/2018 0752   CREATININE 1.1 02/18/2017 0808   CALCIUM 9.1 06/27/2018 0752   CALCIUM 9.4 02/18/2017 0808   PROT 6.4 (L) 06/27/2018 0752   PROT 6.6 02/18/2017 0808   ALBUMIN 4.1 06/27/2018 0752   ALBUMIN 4.2 02/18/2017 0808   AST 37 06/27/2018 0752   AST 34 02/18/2017 0808   ALT 18 06/27/2018 0752   ALT 18 02/18/2017 0808   ALKPHOS 56 06/27/2018 0752   ALKPHOS 53 02/18/2017 0808   BILITOT 0.6 06/27/2018 0752   BILITOT 0.76 02/18/2017 0808   GFRNONAA 48 (L) 06/27/2018 0752   GFRAA 55 (L) 06/27/2018 0752    No results found for: SPEP, UPEP  Lab Results  Component Value Date   WBC 3.3 (L) 06/27/2018   NEUTROABS 1.4 (L) 06/27/2018   HGB 10.1 (L) 06/27/2018   HCT 30.1 (L) 06/27/2018   MCV 113.2 (H) 06/27/2018   PLT 128 (L) 06/27/2018      Chemistry      Component Value Date/Time   NA 140 06/27/2018 0752   NA 140 02/18/2017 0808   K 4.5 06/27/2018 0752   K 4.4 02/18/2017 0808   CL 103 06/27/2018 0752   CL 105 09/04/2012 1006   CO2 28 06/27/2018 0752   CO2 29 02/18/2017 0808   BUN 20  06/27/2018 0752   BUN 20.3 02/18/2017 0808   CREATININE 1.09 (H) 06/27/2018 0752   CREATININE 1.1 02/18/2017 0808      Component Value Date/Time   CALCIUM 9.1 06/27/2018 0752   CALCIUM 9.4 02/18/2017 0808   ALKPHOS 56 06/27/2018 0752   ALKPHOS 53 02/18/2017 0808   AST 37 06/27/2018 0752   AST 34 02/18/2017 0808   ALT 18 06/27/2018 0752   ALT 18 02/18/2017 0808   BILITOT 0.6 06/27/2018 0752   BILITOT 0.76 02/18/2017 0808      All questions were answered. The patient knows to call the clinic with any problems, questions or concerns. No barriers to learning was detected.  I spent 15 minutes counseling the patient face to face. The total time spent in the appointment was 20 minutes and more than 50% was on counseling and review of test results  Heath Lark, MD 07/08/2018 10:47 AM

## 2018-07-08 NOTE — Assessment & Plan Note (Signed)
Her recent mammogram was negative for recurrence.

## 2018-07-08 NOTE — Assessment & Plan Note (Signed)
Her most recent blood work showed low level of detectable BCR/ABL but she remained in major molecular response I recommend she continues treatment at 300 mg daily  The patient will be in [place] in 3 months.  She has arranged for local laboratory to get her blood done and fax the result to me.  I recommend the patient to call me within 10 days of blood test so that I can follow-up on test results

## 2018-07-09 LAB — BCR/ABL

## 2018-08-20 ENCOUNTER — Ambulatory Visit: Payer: Self-pay

## 2018-08-20 NOTE — Telephone Encounter (Signed)
Patient called and says this morning her left breast nipple is swollen and leaking a yellowish fluid. She says it's tender, no pain to the breast, no swelling to the breast, no redness, no rash, no fever. I called the office and spoke to Sam, [facility] who advised the patient to call her OBGYN and says to let her know if they recommends her to see Dr. Quay Burow to let the patient know to call us back. I advised the patient of the above from [place], [facility], patient verbalized understanding.  Answer Assessment - Initial Assessment Questions 1. SYMPTOM: "What's the main symptom you're concerned about?"  (e.g., lump, pain, rash, nipple discharge)     Tip of nipple swelling and oozing yellowish fluid 2. LOCATION: "Where is the swelling located?"     Left nipple 3. ONSET: "When did swelling start?"     This morning I noticed it 4. PRIOR HISTORY: "Do you have any history of prior problems with your breasts?" (e.g., lumps, cancer, fibrocystic breast disease)     Cancer in the other breast more than 25 years ago 5. CAUSE: "What do you think is causing this symptom?"     I don't know 6. OTHER SYMPTOMS: "Do you have any other symptoms?" (e.g., fever, breast pain, redness or rash, nipple discharge)     Nipple tender, nipple discharge, a little red around nipple, swelling to nipple 7. PREGNANCY-BREASTFEEDING: "Is there any chance you are pregnant?" "When was your last menstrual period?" "Are you breastfeeding?"     No  Protocols used: BREAST [facility]

## 2018-08-28 ENCOUNTER — Other Ambulatory Visit: Payer: Self-pay | Admitting: Hematology and Oncology

## 2018-08-28 ENCOUNTER — Telehealth: Payer: Self-pay

## 2018-08-28 ENCOUNTER — Other Ambulatory Visit: Payer: Self-pay

## 2018-08-28 DIAGNOSIS — E538 Deficiency of other specified B group vitamins: Secondary | ICD-10-CM

## 2018-08-28 DIAGNOSIS — T451X5A Adverse effect of antineoplastic and immunosuppressive drugs, initial encounter: Secondary | ICD-10-CM

## 2018-08-28 DIAGNOSIS — C9210 Chronic myeloid leukemia, BCR/ABL-positive, not having achieved remission: Secondary | ICD-10-CM

## 2018-08-28 DIAGNOSIS — D61810 Antineoplastic chemotherapy induced pancytopenia: Secondary | ICD-10-CM

## 2018-08-28 NOTE — Telephone Encounter (Signed)
Called back per Dr. Alvy Bimler and offered appt for tomorrow. She will go to radiology tomorrow at 0930 for chest xray at [facility]. Then come to [facility] for lab at 1015 and see Dr. Alvy Bimler at 1045. She verbalized understanding. Scheduling message sent.

## 2018-08-28 NOTE — Telephone Encounter (Signed)
She called and left a message. She had a high bp last week at a [place] office. Complaining of tiredness, mild belly ache. Feeling like she is having heart palpations and weakness. Yesterday she went for a walk and felt SOB after the walk. Her question is should she get blood work, appt?

## 2018-08-29 ENCOUNTER — Inpatient Hospital Stay: Payer: Medicare Other | Attending: Hematology and Oncology | Admitting: Hematology and Oncology

## 2018-08-29 ENCOUNTER — Ambulatory Visit (HOSPITAL_COMMUNITY)
Admission: RE | Admit: 2018-08-29 | Discharge: 2018-08-29 | Disposition: A | Discharge status: Home / Self Care | Payer: Medicare Other | Source: Ambulatory Visit | Attending: Hematology and Oncology | Admitting: Hematology and Oncology

## 2018-08-29 ENCOUNTER — Other Ambulatory Visit: Payer: Self-pay

## 2018-08-29 ENCOUNTER — Inpatient Hospital Stay: Payer: Medicare Other

## 2018-08-29 ENCOUNTER — Encounter: Payer: Self-pay | Admitting: Hematology and Oncology

## 2018-08-29 ENCOUNTER — Telehealth: Payer: Self-pay

## 2018-08-29 DIAGNOSIS — T451X5A Adverse effect of antineoplastic and immunosuppressive drugs, initial encounter: Secondary | ICD-10-CM

## 2018-08-29 DIAGNOSIS — D61810 Antineoplastic chemotherapy induced pancytopenia: Secondary | ICD-10-CM | POA: Insufficient documentation

## 2018-08-29 DIAGNOSIS — C9210 Chronic myeloid leukemia, BCR/ABL-positive, not having achieved remission: Secondary | ICD-10-CM

## 2018-08-29 DIAGNOSIS — R5383 Other fatigue: Secondary | ICD-10-CM | POA: Insufficient documentation

## 2018-08-29 DIAGNOSIS — E538 Deficiency of other specified B group vitamins: Secondary | ICD-10-CM

## 2018-08-29 DIAGNOSIS — R0602 Shortness of breath: Secondary | ICD-10-CM | POA: Insufficient documentation

## 2018-08-29 DIAGNOSIS — Z79899 Other long term (current) drug therapy: Secondary | ICD-10-CM | POA: Insufficient documentation

## 2018-08-29 LAB — COMPREHENSIVE METABOLIC PANEL
ALT: 27 U/L (ref 0–44)
AST: 47 U/L — ABNORMAL HIGH (ref 15–41)
Albumin: 4 g/dL (ref 3.5–5.0)
Alkaline Phosphatase: 62 U/L (ref 38–126)
Anion gap: 8 (ref 5–15)
BUN: 22 mg/dL (ref 8–23)
CO2: 29 mmol/L (ref 22–32)
Calcium: 9.1 mg/dL (ref 8.9–10.3)
Chloride: 102 mmol/L (ref 98–111)
Creatinine, Ser: 1.12 mg/dL — ABNORMAL HIGH (ref 0.44–1.00)
GFR calc Af Amer: 53 mL/min — ABNORMAL LOW (ref >60)
GFR calc non Af Amer: 46 mL/min — ABNORMAL LOW (ref >60)
Glucose, Bld: 96 mg/dL (ref 70–99)
Potassium: 4.7 mmol/L (ref 3.5–5.1)
Sodium: 139 mmol/L (ref 135–145)
Total Bilirubin: 0.4 mg/dL (ref 0.3–1.2)
Total Protein: 6.4 g/dL — ABNORMAL LOW (ref 6.5–8.1)

## 2018-08-29 LAB — VITAMIN B12: Vitamin B-12: 931 pg/mL — ABNORMAL HIGH (ref 180–914)

## 2018-08-29 LAB — CBC WITH DIFFERENTIAL/PLATELET
Abs Immature Granulocytes: 0 10*3/uL (ref 0.00–0.07)
Basophils Absolute: 0 10*3/uL (ref 0.0–0.1)
Basophils Relative: 1 %
Eosinophils Absolute: 0.1 10*3/uL (ref 0.0–0.5)
Eosinophils Relative: 2 %
HCT: 28.5 % — ABNORMAL LOW (ref 36.0–46.0)
Hemoglobin: 9.9 g/dL — ABNORMAL LOW (ref 12.0–15.0)
Immature Granulocytes: 0 %
Lymphocytes Relative: 30 %
Lymphs Abs: 1 10*3/uL (ref 0.7–4.0)
MCH: 39.1 pg — ABNORMAL HIGH (ref 26.0–34.0)
MCHC: 34.7 g/dL (ref 30.0–36.0)
MCV: 112.6 fL — ABNORMAL HIGH (ref 80.0–100.0)
Monocytes Absolute: 0.4 10*3/uL (ref 0.1–1.0)
Monocytes Relative: 12 %
Neutro Abs: 1.9 10*3/uL (ref 1.7–7.7)
Neutrophils Relative %: 55 %
Platelets: 119 10*3/uL — ABNORMAL LOW (ref 150–400)
RBC: 2.53 MIL/uL — ABNORMAL LOW (ref 3.87–5.11)
RDW: 13.3 % (ref 11.5–15.5)
WBC: 3.4 10*3/uL — ABNORMAL LOW (ref 4.0–10.5)
nRBC: 0 % (ref 0.0–0.2)

## 2018-08-29 LAB — TSH: TSH: 1.506 u[IU]/mL (ref 0.308–3.960)

## 2018-08-29 LAB — SAMPLE TO BLOOD BANK

## 2018-08-29 NOTE — Assessment & Plan Note (Signed)
She has stable pancytopenia Serum vitamin B12 and thyroid function were within normal limits She will continue Gleevec as prescribed 

## 2018-08-29 NOTE — Telephone Encounter (Signed)
-----   Message from Heath Lark, MD sent at 08/29/2018  1:12 PM EDT ----- Regarding: TSH and B12 ok Let her know TSH and B12 are ok. We will call her next week for BCR/ABL result for her CML ----- Message ----- From: Interface, Lab In [place] Sent: 08/29/2018  10:48 AM EDT To: Heath Lark, MD

## 2018-08-29 NOTE — Telephone Encounter (Signed)
Called and given below message. She verbalized understanding. 

## 2018-08-29 NOTE — Progress Notes (Signed)
[place] OFFICE PROGRESS NOTE  Patient Care Team: Binnie Rail, MD as PCP - General (Internal Medicine)  ASSESSMENT & PLAN:  Chronic myeloid leukemia Her blood count is stable She has mild pancytopenia with treatment but I do not believe is the cause of her symptoms Repeat BCR/ABL is pending We will call her next week with test results The patient will contact me for future appointment  Pancytopenia due to antineoplastic chemotherapy [facility]) She has stable pancytopenia Serum vitamin B12 and thyroid function were within normal limits She will continue Gleevec as prescribed  Other fatigue She complained of excessive fatigue and shortness of breath I reviewed chest x-ray with the patient There were no evidence of infiltrate or pleural effusion Thyroid function is normal She is reassured   No orders of the defined types were placed in this encounter.   INTERVAL HISTORY: Please see below for problem oriented charting. She is seen urgently today due to feeling unwell She has some complaints of shortness of breath and excessive fatigue Denies recent fever or chills No recent cough Her appetite is stable The patient denies any recent signs or symptoms of bleeding such as spontaneous epistaxis, hematuria or hematochezia.   SUMMARY OF ONCOLOGIC HISTORY:   Chronic myeloid leukemia ([place])   02/18/2007 Initial Diagnosis    Chronic myeloid leukemia    08/20/2014 Tumor Marker    BCR/ABL is undetectable    03/04/2015 Pathology Results    BCR/ABL is undetectable    08/29/2015 Tumor Marker    BCR/ABL is undetectable    02/29/2016 Pathology Results    BCR/ABL is undetectable    05/01/2016 Pathology Results    BCR/ABL b2a2 is detected at 0.03% and IS ratio 0.069%    05/31/2016 Pathology Results    BCR/ABL b2a2 is detected at 0.12% and IS ratio 0.276%    06/19/2016 Pathology Results    BCR/ABL b2a2 is detected at 0.24% and IS ratio 0.552%    06/19/2016 Miscellaneous     ABL mutation kinase testing is negative    06/27/2016 Miscellaneous    Dose of Gleevec is increased to 600 mg daily    07/25/2016 Pathology Results    BCR/ABL b2a2 is detected at 0.03% and IS ratio 0.069%. She has achieved MMR    08/30/2016 Miscellaneous    Dose of Gleevec is reduced to 300 mg daily and subsequently down to 200 mg daily    10/22/2016 Pathology Results    BCR/ABL b2a2 is not detected. She has achieved MMR    01/03/2017 Pathology Results    BCR/ABL b2a2 is detected IS ratio 0.041%    02/18/2017 Pathology Results    BCR/ABL b2a2 is detected IS ratio 0.0126    05/21/2017 Pathology Results    BCR/ABL b2a2 is not detected. She has achieved MMR    08/20/2017 Pathology Results    BCR/ABL b2a2 is not detected. She has achieved MMR    11/12/2017 Pathology Results    BCR/ABL b2a2 is not detected    04/07/2018 Pathology Results    BCR/ABL b2a2 is detected IS ratio 0.0062. She is in MMR    06/27/2018 Pathology Results    BCR/ABL b2a2 is detected IS ratio 0.0097%. She is in MMR     REVIEW OF SYSTEMS:   Constitutional: Denies fevers, chills or abnormal weight loss Eyes: Denies blurriness of vision Ears, nose, mouth, throat, and face: Denies mucositis or sore throat Respiratory: Denies cough, dyspnea or wheezes Cardiovascular: Denies palpitation, chest discomfort or lower  extremity swelling Gastrointestinal:  Denies nausea, heartburn or change in bowel habits Skin: Denies abnormal skin rashes Lymphatics: Denies new lymphadenopathy or easy bruising Neurological:Denies numbness, tingling or new weaknesses Behavioral/Psych: Mood is stable, no new changes  All other systems were reviewed with the patient and are negative.  I have reviewed the past medical history, past surgical history, social history and family history with the patient and they are unchanged from previous note.  ALLERGIES:  is allergic to penicillins.  MEDICATIONS:  Current Outpatient Medications   Medication Sig Dispense Refill  . aspirin 81 MG tablet Take 81 mg by mouth daily.    . Biotin 10000 MCG TABS Take 0.25 tablets by mouth daily.    . Calcium Carb-Cholecalciferol (CALCIUM 600 + D PO) Take 1 tablet by mouth daily.    . Cyanocobalamin (B-12) 5000 MCG CAPS Take 1 tablet by mouth daily.    Marland Kitchen estradiol (ESTRACE VAGINAL) 0.1 MG/GM vaginal cream Place 1 Applicatorful vaginally 2 (two) times a week. 42.5 g 8  . FINACEA 15 % cream Apply 1 application topically daily.  2  . folic acid (FOLVITE) 1 MG tablet TAKE 1 TABLET(1 MG) BY MOUTH DAILY 90 tablet 3  . Glucosamine-Chondroit-Vit C-Mn (GLUCOSAMINE CHONDR 1500 COMPLX) CAPS Take 1 capsule by mouth daily.    Marland Kitchen ibuprofen (ADVIL,MOTRIN) 200 MG tablet Take 400 mg by mouth every 6 (six) hours as needed for moderate pain.     Marland Kitchen imatinib (GLEEVEC) 100 MG tablet Take 3 tablets (300 mg total) by mouth daily. 270 tablet 3  . indomethacin (INDOCIN) 50 MG capsule Take 1 capsule (50 mg total) by mouth 3 (three) times daily with meals. 15 capsule 0  . Loperamide HCl (IMODIUM PO) Take 0.5-1 tablets by mouth 2 (two) times daily as needed (takes 0.5 tablet every morning and 2nd dose if needed for diarrhea).     . Multiple Vitamin (MULTIVITAMIN) tablet Take 1 tablet by mouth daily.    . Probiotic Product (ALIGN) 4 MG CAPS Take 1 capsule by mouth daily.     No current facility-administered medications for this visit.     PHYSICAL EXAMINATION: ECOG PERFORMANCE STATUS: 1 - Symptomatic but completely ambulatory  Vitals:   08/29/18 1115  BP: (!) 127/57   Filed Weights   08/29/18 1115  Weight: 123 lb 3.2 oz (55.9 kg)    GENERAL:alert, no distress and comfortable SKIN: skin color, texture, turgor are normal, no rashes or significant lesions EYES: normal, Conjunctiva are pink and non-injected, sclera clear OROPHARYNX:no exudate, no erythema and lips, buccal mucosa, and tongue normal  NECK: supple, thyroid normal size, non-tender, without  nodularity LYMPH:  no palpable lymphadenopathy in the cervical, axillary or inguinal LUNGS: clear to auscultation and percussion with normal breathing effort HEART: regular rate & rhythm and no murmurs and no lower extremity edema ABDOMEN:abdomen soft, non-tender and normal bowel sounds Musculoskeletal:no cyanosis of digits and no clubbing  NEURO: alert & oriented x 3 with fluent speech, no focal motor/sensory deficits  LABORATORY DATA:  I have reviewed the data as listed    Component Value Date/Time   NA 139 08/29/2018 1032   NA 140 02/18/2017 0808   K 4.7 08/29/2018 1032   K 4.4 02/18/2017 0808   CL 102 08/29/2018 1032   CL 105 09/04/2012 1006   CO2 29 08/29/2018 1032   CO2 29 02/18/2017 0808   GLUCOSE 96 08/29/2018 1032   GLUCOSE 86 02/18/2017 0808   GLUCOSE 96 09/04/2012 1006   BUN  22 08/29/2018 1032   BUN 20.3 02/18/2017 0808   CREATININE 1.12 (H) 08/29/2018 1032   CREATININE 1.1 02/18/2017 0808   CALCIUM 9.1 08/29/2018 1032   CALCIUM 9.4 02/18/2017 0808   PROT 6.4 (L) 08/29/2018 1032   PROT 6.6 02/18/2017 0808   ALBUMIN 4.0 08/29/2018 1032   ALBUMIN 4.2 02/18/2017 0808   AST 47 (H) 08/29/2018 1032   AST 34 02/18/2017 0808   ALT 27 08/29/2018 1032   ALT 18 02/18/2017 0808   ALKPHOS 62 08/29/2018 1032   ALKPHOS 53 02/18/2017 0808   BILITOT 0.4 08/29/2018 1032   BILITOT 0.76 02/18/2017 0808   GFRNONAA 46 (L) 08/29/2018 1032   GFRAA 53 (L) 08/29/2018 1032    No results found for: SPEP, UPEP  Lab Results  Component Value Date   WBC 3.4 (L) 08/29/2018   NEUTROABS 1.9 08/29/2018   HGB 9.9 (L) 08/29/2018   HCT 28.5 (L) 08/29/2018   MCV 112.6 (H) 08/29/2018   PLT 119 (L) 08/29/2018      Chemistry      Component Value Date/Time   NA 139 08/29/2018 1032   NA 140 02/18/2017 0808   K 4.7 08/29/2018 1032   K 4.4 02/18/2017 0808   CL 102 08/29/2018 1032   CL 105 09/04/2012 1006   CO2 29 08/29/2018 1032   CO2 29 02/18/2017 0808   BUN 22 08/29/2018 1032   BUN  20.3 02/18/2017 0808   CREATININE 1.12 (H) 08/29/2018 1032   CREATININE 1.1 02/18/2017 0808      Component Value Date/Time   CALCIUM 9.1 08/29/2018 1032   CALCIUM 9.4 02/18/2017 0808   ALKPHOS 62 08/29/2018 1032   ALKPHOS 53 02/18/2017 0808   AST 47 (H) 08/29/2018 1032   AST 34 02/18/2017 0808   ALT 27 08/29/2018 1032   ALT 18 02/18/2017 0808   BILITOT 0.4 08/29/2018 1032   BILITOT 0.76 02/18/2017 0808       RADIOGRAPHIC STUDIES: I have reviewed imaging study with the patient I have personally reviewed the radiological images as listed and agreed with the findings in the report. Dg Chest 2 View  Result Date: 08/29/2018 CLINICAL DATA:  History of leukemia. EXAM: CHEST - 2 VIEW COMPARISON:  04/28/2013 FINDINGS: Lungs are adequately inflated without focal airspace consolidation or effusion. Cardiomediastinal silhouette is normal. Surgical clips over the right axillary region. Remainder of the exam is unchanged. IMPRESSION: No active cardiopulmonary disease. Electronically Signed   By: Marin Olp M.D.   On: 08/29/2018 09:59    All questions were answered. The patient knows to call the clinic with any problems, questions or concerns. No barriers to learning was detected.  I spent 15 minutes counseling the patient face to face. The total time spent in the appointment was 20 minutes and more than 50% was on counseling and review of test results  Heath Lark, MD 08/29/2018 1:35 PM

## 2018-08-29 NOTE — Assessment & Plan Note (Signed)
Her blood count is stable She has mild pancytopenia with treatment but I do not believe is the cause of her symptoms Repeat BCR/ABL is pending We will call her next week with test results The patient will contact me for future appointment

## 2018-08-29 NOTE — Assessment & Plan Note (Signed)
She complained of excessive fatigue and shortness of breath I reviewed chest x-ray with the patient There were no evidence of infiltrate or pleural effusion Thyroid function is normal She is reassured

## 2018-08-30 ENCOUNTER — Encounter: Payer: Self-pay | Admitting: Hematology and Oncology

## 2018-09-05 ENCOUNTER — Telehealth: Payer: Self-pay | Admitting: *Deleted

## 2018-09-05 NOTE — Telephone Encounter (Signed)
Telephone call to patient to advise lab results as directed below. Patient verbalized an understanding and thanked Therapist, sports for the call.

## 2018-09-05 NOTE — Telephone Encounter (Signed)
-----   Message from Heath Lark, MD sent at 09/04/2018  2:49 PM EDT ----- Regarding: labs Pls call and let her know her recent BCR/ABL is undetectable and she is in remission

## 2018-09-09 LAB — BCR/ABL

## 2018-09-13 ENCOUNTER — Emergency Department (HOSPITAL_COMMUNITY): Payer: Medicare Other

## 2018-09-13 ENCOUNTER — Other Ambulatory Visit: Payer: Self-pay

## 2018-09-13 ENCOUNTER — Encounter (HOSPITAL_COMMUNITY): Payer: Self-pay | Admitting: *Deleted

## 2018-09-13 ENCOUNTER — Emergency Department (HOSPITAL_COMMUNITY)
Admission: EM | Admit: 2018-09-13 | Discharge: 2018-09-13 | Disposition: A | Discharge status: Home / Self Care | Payer: Medicare Other | Attending: Emergency Medicine | Admitting: Emergency Medicine

## 2018-09-13 DIAGNOSIS — Z7982 Long term (current) use of aspirin: Secondary | ICD-10-CM | POA: Diagnosis not present

## 2018-09-13 DIAGNOSIS — Z87891 Personal history of nicotine dependence: Secondary | ICD-10-CM | POA: Diagnosis not present

## 2018-09-13 DIAGNOSIS — Z79899 Other long term (current) drug therapy: Secondary | ICD-10-CM | POA: Diagnosis not present

## 2018-09-13 DIAGNOSIS — R002 Palpitations: Secondary | ICD-10-CM | POA: Diagnosis present

## 2018-09-13 LAB — D-DIMER, QUANTITATIVE: D-Dimer, Quant: 2.89 ug{FEU}/mL — ABNORMAL HIGH (ref 0.00–0.50)

## 2018-09-13 LAB — COMPREHENSIVE METABOLIC PANEL
ALT: 34 U/L (ref 0–44)
AST: 44 U/L — ABNORMAL HIGH (ref 15–41)
Albumin: 4.3 g/dL (ref 3.5–5.0)
Alkaline Phosphatase: 62 U/L (ref 38–126)
Anion gap: 9 (ref 5–15)
BUN: 25 mg/dL — ABNORMAL HIGH (ref 8–23)
CO2: 25 mmol/L (ref 22–32)
Calcium: 9.1 mg/dL (ref 8.9–10.3)
Chloride: 105 mmol/L (ref 98–111)
Creatinine, Ser: 1.05 mg/dL — ABNORMAL HIGH (ref 0.44–1.00)
GFR calc Af Amer: 57 mL/min — ABNORMAL LOW (ref >60)
GFR calc non Af Amer: 49 mL/min — ABNORMAL LOW (ref >60)
Glucose, Bld: 113 mg/dL — ABNORMAL HIGH (ref 70–99)
Potassium: 4 mmol/L (ref 3.5–5.1)
Sodium: 139 mmol/L (ref 135–145)
Total Bilirubin: 0.6 mg/dL (ref 0.3–1.2)
Total Protein: 6.4 g/dL — ABNORMAL LOW (ref 6.5–8.1)

## 2018-09-13 LAB — CBC WITH DIFFERENTIAL/PLATELET
Abs Immature Granulocytes: 0.01 K/uL (ref 0.00–0.07)
Basophils Absolute: 0 K/uL (ref 0.0–0.1)
Basophils Relative: 1 %
Eosinophils Absolute: 0 K/uL (ref 0.0–0.5)
Eosinophils Relative: 1 %
HCT: 29.8 % — ABNORMAL LOW (ref 36.0–46.0)
Hemoglobin: 9.8 g/dL — ABNORMAL LOW (ref 12.0–15.0)
Immature Granulocytes: 0 %
Lymphocytes Relative: 22 %
Lymphs Abs: 0.9 K/uL (ref 0.7–4.0)
MCH: 38.1 pg — ABNORMAL HIGH (ref 26.0–34.0)
MCHC: 32.9 g/dL (ref 30.0–36.0)
MCV: 116 fL — ABNORMAL HIGH (ref 80.0–100.0)
Monocytes Absolute: 0.5 K/uL (ref 0.1–1.0)
Monocytes Relative: 13 %
Neutro Abs: 2.5 K/uL (ref 1.7–7.7)
Neutrophils Relative %: 63 %
Platelets: 135 K/uL — ABNORMAL LOW (ref 150–400)
RBC: 2.57 MIL/uL — ABNORMAL LOW (ref 3.87–5.11)
RDW: 13.9 % (ref 11.5–15.5)
WBC: 3.9 K/uL — ABNORMAL LOW (ref 4.0–10.5)
nRBC: 0 % (ref 0.0–0.2)

## 2018-09-13 LAB — URINALYSIS, ROUTINE W REFLEX MICROSCOPIC
Bilirubin Urine: NEGATIVE
Glucose, UA: NEGATIVE mg/dL
Hgb urine dipstick: NEGATIVE
Ketones, ur: NEGATIVE mg/dL
Leukocytes,Ua: NEGATIVE
Nitrite: NEGATIVE
Protein, ur: NEGATIVE mg/dL
Specific Gravity, Urine: 1.003 — ABNORMAL LOW (ref 1.005–1.030)
pH: 6 (ref 5.0–8.0)

## 2018-09-13 LAB — TROPONIN I: Troponin I: <0.03 ng/mL (ref <0.03)

## 2018-09-13 MED ORDER — METOPROLOL TARTRATE 25 MG PO TABS: 12.5000 mg | ORAL_TABLET | Freq: Two times a day (BID) | ORAL | 0 refills | Status: DC

## 2018-09-13 MED ORDER — SODIUM CHLORIDE (PF) 0.9 % IJ SOLN
INTRAMUSCULAR | Status: AC
  Filled 2018-09-13: qty 50

## 2018-09-13 MED ORDER — IOHEXOL 350 MG/ML SOLN
100.0000 mL | Freq: Once | INTRAVENOUS | Status: AC | PRN
  Administered 2018-09-13: 16:00:00 100 mL via INTRAVENOUS

## 2018-09-13 NOTE — ED Notes (Signed)
Pt ambulated to restroom without assistance.

## 2018-09-13 NOTE — ED Triage Notes (Signed)
Pt states for the last couple of weeks while exercising she has been having a rapid HR, noted more when walking up hill during exercise, shob during this time, resolves when she stops exercising. Asymptomatic upon arrival to ED

## 2018-09-13 NOTE — ED Provider Notes (Signed)
[facility] DEPT Provider Note   CSN: [id number] Arrival date & time: 09/13/18  1228    History   Chief Complaint Chief Complaint  Patient presents with  . Palpations    HPI [name] [name] is a 84 y.o. female.     HPI Patient states she has had several weeks of "fluttering" sensation in her chest when she is walking.  States she has had dyspnea with exertion and increased fatigue.  Denies any chest pain or pressure.  No recent cough, fever or chills.  No nausea, vomiting or diarrhea.  Denies urinary symptoms.  No new lower extremity swelling or pain. Past Medical History:  Diagnosis Date  . Anemia requiring transfusions 2010 & 2015  . Arthritis    DJD  . Breast cancer ([place]) 1993   S/P lumpectomy , radiation, & Tamoxifen  . CML (chronic myeloid leukemia) ([place])    Dr Alvy Bimler    Patient Active Problem List   Diagnosis Date Noted  . Iron deficiency anemia 11/21/2017  . Pain involving joint of finger of right hand 10/22/2017  . Vaginal atrophy 10/15/2017  . B12 deficiency 02/26/2017  . Bone lesion 09/18/2016  . Urinary incontinence 04/11/2016  . Vitamin D deficiency 10/02/2015  . Other fatigue 04/07/2015  . History of colonic polyps 08/06/2014  . Thrombocytopenia due to drugs 02/18/2014  . Leukopenia due to antineoplastic chemotherapy ([place]) 02/18/2014  . 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 History of breast cancer 07/18/2012  . Osteopenia 09/15/2011  . Diarrhea 08/29/2011  . Family history of malignant neoplasm of gastrointestinal tract 08/29/2011  . Hyperlipidemia 04/14/2009  . Pancytopenia due to antineoplastic chemotherapy ([place]) 04/14/2009  . DIVERTICULOSIS OF COLON 04/06/2008  . Chronic myeloid leukemia ([place]) 02/18/2007  . ROSACEA 02/18/2007    Past Surgical History:  Procedure Laterality Date  . ABDOMINAL HYSTERECTOMY  ~1989   no BSO, for Dysplasia   . APPENDECTOMY  ~1989  . BILATERAL SALPINGOOPHORECTOMY  2010   with bowel  resection  . BREAST SURGERY     Right lumpectomy, s/p radiation 1993 and oral  Tamoxifen x 5 years ([id number])   . CATARACT EXTRACTION  06/08/2014   left eye;Dr Herbert Deaner  . COLONOSCOPY  03/2003, last 2010   diverticulosis, Dr.Kaplan  . EXCISION MORTON'S NEUROMA Left mid 80's   foot  . gastrocslide     left leg; Dr Beola Cord  . ileostomy reversal  2011   3 mos after above  . KNEE SURGERY  mid 80's   Arthroscopy, right knee   . LEG TENDON SURGERY Left 2013   "cut on leg and fixed something"  . OPEN SURGICAL REPAIR OF GLUTEAL TENDON Left 10/24/2016   Procedure: Left hip bursectomy; gluteal tendon repair;  Surgeon: Gaynelle Arabian, MD;  Location: [facility] ORS;  Service: Orthopedics;  Laterality: Left;  . sigmoid colon resection  2010    Dr Arlyce Dice, [place] for fistula & diverticulitis  . TOTAL KNEE ARTHROPLASTY Right 06/11/2013   Procedure: RIGHT TOTAL KNEE ARTHROPLASTY;  Surgeon: Sydnee Cabal, MD;  Location: [facility];  Service: Orthopedics;  Laterality: Right;  . TRIGGER FINGER RELEASE Left 2013   thumb     OB History   No obstetric history on file.      Home Medications    Prior to Admission medications   Medication Sig Start Date End Date Taking? Authorizing Provider  aspirin 81 MG tablet Take 81 mg by mouth daily.    [provider]  Biotin 10000 MCG TABS  Take 0.25 tablets by mouth daily.    [provider]  Calcium Carb-Cholecalciferol (CALCIUM 600 + D PO) Take 1 tablet by mouth daily.    [provider]  Cyanocobalamin (B-12) 5000 MCG CAPS Take 1 tablet by mouth daily.    [provider]  estradiol (ESTRACE VAGINAL) 0.1 MG/GM vaginal cream Place 1 Applicatorful vaginally 2 (two) times a week. 10/17/17   Binnie Rail, MD  FINACEA 15 % cream Apply 1 application topically daily. 08/03/16   [provider]  folic acid (FOLVITE) 1 MG tablet TAKE 1 TABLET(1 MG) BY MOUTH DAILY 10/15/17   Burns, Claudina Lick, MD  Glucosamine-Chondroit-Vit C-Mn  (GLUCOSAMINE CHONDR 1500 COMPLX) CAPS Take 1 capsule by mouth daily.    [provider]  ibuprofen (ADVIL,MOTRIN) 200 MG tablet Take 400 mg by mouth every 6 (six) hours as needed for moderate pain.     [provider]  imatinib (GLEEVEC) 100 MG tablet Take 3 tablets (300 mg total) by mouth daily. 11/21/17   Heath Lark, MD  indomethacin (INDOCIN) 50 MG capsule Take 1 capsule (50 mg total) by mouth 3 (three) times daily with meals. 10/22/17   Binnie Rail, MD  Loperamide HCl (IMODIUM PO) Take 0.5-1 tablets by mouth 2 (two) times daily as needed (takes 0.5 tablet every morning and 2nd dose if needed for diarrhea).     [provider]  Multiple Vitamin (MULTIVITAMIN) tablet Take 1 tablet by mouth daily.    [provider]  Probiotic Product (ALIGN) 4 MG CAPS Take 1 capsule by mouth daily.    [provider]    Family History Family History  Problem Relation Age of Onset  . Colon cancer Mother 67  . Uterine cancer Sister   . Diabetes Cousin   . Cancer Father        Lip cancer  . Aneurysm Father        AAA;S/P surgery  . Breast cancer Paternal Aunt   . Heart attack Paternal Uncle 28  . Stroke Neg Hx   . Esophageal cancer Neg Hx   . Rectal cancer Neg Hx   . Stomach cancer Neg Hx     Social History Social History   Tobacco Use  . Smoking status: Former Smoker    Years: 5.00    Types: Cigarettes    Last attempt to quit: 04/30/1962    Years since quitting: 56.4  . Smokeless tobacco: Never Used  . Tobacco comment: smoked 1956-1964, up to 1/2 ppd  Substance Use Topics  . Alcohol use: Yes    Alcohol/week: 7.0 - 14.0 standard drinks    Types: 7 - 14 Glasses of wine per week  . Drug use: No     Allergies   Penicillins   Review of Systems Review of Systems  Constitutional: Positive for fatigue. Negative for chills and fever.  HENT: Negative for sore throat and trouble swallowing.   Eyes: Negative for visual disturbance.  Respiratory:  Positive for shortness of breath. Negative for cough.   Cardiovascular: Positive for palpitations. Negative for chest pain and leg swelling.  Gastrointestinal: Negative for abdominal pain, constipation, diarrhea, nausea and vomiting.  Genitourinary: Negative for difficulty urinating, dysuria, flank pain and frequency.  Musculoskeletal: Negative for back pain, myalgias and neck pain.  Skin: Negative for rash and wound.  Neurological: Positive for weakness. Negative for dizziness, light-headedness, numbness and headaches.  All other systems reviewed and are negative.    Physical Exam Updated Vital  Signs BP 136/78 (BP Location: Right Arm)   Pulse 84   Temp 98.1 F (36.7 C) (Oral)   Resp 16   Ht 5\' 1"  (1.549 m)   Wt 54.4 kg   SpO2 100%   BMI 22.67 kg/m   Physical Exam Vitals signs and nursing note reviewed.  Constitutional:      General: She is not in acute distress.    Appearance: Normal appearance. She is well-developed. She is not ill-appearing.  HENT:     Head: Normocephalic and atraumatic.     Nose: Nose normal.     Mouth/Throat:     Mouth: Mucous membranes are moist.  Eyes:     Pupils: Pupils are equal, round, and reactive to light.  Neck:     Musculoskeletal: Normal range of motion and neck supple. No neck rigidity or muscular tenderness.     Vascular: No carotid bruit.  Cardiovascular:     Rate and Rhythm: Normal rate and regular rhythm.     Heart sounds: No murmur. No friction rub. No gallop.   Pulmonary:     Effort: Pulmonary effort is normal. No respiratory distress.     Breath sounds: Normal breath sounds. No stridor. No wheezing, rhonchi or rales.  Chest:     Chest wall: No tenderness.  Abdominal:     General: Bowel sounds are normal.     Palpations: Abdomen is soft.     Tenderness: There is no abdominal tenderness. There is no guarding or rebound.  Musculoskeletal: Normal range of motion.        General: No swelling, tenderness, deformity or signs of  injury.     Right lower leg: No edema.     Left lower leg: No edema.  Lymphadenopathy:     Cervical: No cervical adenopathy.  Skin:    General: Skin is warm and dry.     Coloration: Skin is pale.     Findings: No erythema or rash.  Neurological:     General: No focal deficit present.     Mental Status: She is alert and oriented to person, place, and time.     Comments: 5/5 motor in all extremities.  Sensation intact.  Psychiatric:        Behavior: Behavior normal.      ED Treatments / Results  Labs (all labs ordered are listed, but only abnormal results are displayed) Labs Reviewed  CBC WITH DIFFERENTIAL/PLATELET  COMPREHENSIVE METABOLIC PANEL  URINALYSIS, ROUTINE W REFLEX MICROSCOPIC  D-DIMER, QUANTITATIVE (NOT AT [facility])  TROPONIN I    EKG EKG Interpretation  Date/Time:  Saturday Sep 13 2018 12:41:56 EDT Ventricular Rate:  82 PR Interval:    QRS Duration: 85 QT Interval:  397 QTC Calculation: 464 R Axis:   38 Text Interpretation:  Sinus rhythm Short PR interval Abnormal R-wave progression, early transition Confirmed by Julianne Rice [id number]) on 09/13/2018 12:53:25 PM   Radiology No results found.  Procedures Procedures (including critical care time)  Medications Ordered in ED Medications - No data to display   Initial Impression / Assessment and Plan / ED Course  I have reviewed the triage vital signs and the nursing notes.  Pertinent labs & imaging results that were available during my care of the patient were reviewed by me and considered in my medical decision making (see chart for details).       Signed out to oncoming emergency provider pending CT angio chest to rule out PE.   Final Clinical Impressions(s) /  ED Diagnoses   Final diagnoses:  None    ED Discharge Orders    None       Julianne Rice, MD 09/14/18 [phone]

## 2018-09-13 NOTE — ED Notes (Signed)
Patient transported to CT 

## 2018-09-13 NOTE — ED Provider Notes (Signed)
History concerning for intermittent A. fib or other arrhythmia.  Currently in sinus rhythm.  Electrolytes are within normal limits.  Imaging and additional work-up today without acute abnormality.  Most commonly this would likely be atrial fibrillation and therefore she will be started on 12.5 mg metoprolol twice daily.  Outpatient cardiology follow-up.  She will benefit from Holter monitor and likely echocardiogram.  She understands the importance of close cardiology follow-up and the understanding that additional work-up is required.  No additional work-up needs to be completed today nor does she need to be hospitalized.  Well-appearing.  Vital signs stable.  Information was also relayed to her husband via phone   Jola Schmidt, MD 09/13/18 [phone]

## 2018-09-13 NOTE — ED Notes (Signed)
Pt returned from CT °

## 2018-09-18 ENCOUNTER — Telehealth: Payer: Self-pay | Admitting: Internal Medicine

## 2018-09-18 NOTE — Progress Notes (Signed)
Virtual Visit via Video Note   This visit type was conducted due to national recommendations for restrictions regarding the COVID-19 Pandemic (e.g. social distancing) in an effort to limit this patient's exposure and mitigate transmission in our community.  Due to her co-morbid illnesses, this patient is at least at moderate risk for complications without adequate follow up.  This format is felt to be most appropriate for this patient at this time.  All issues noted in this document were discussed and addressed.  A limited physical exam was performed with this format.  Please refer to the patient's chart for her consent to telehealth for [facility]   Date:  09/19/2018    ID:  [name] [name], DOB January 08, 1935, MRN [id number]  Patient Location: Home Provider Location: Home  PCP:  Binnie Rail, MD  Cardiologist:  Minus Breeding, MD  Electrophysiologist:  None   Evaluation Performed:  New Patient Evaluation  Chief Complaint:  Palpitations  History of Present Illness:    [name] [name] is a 84 y.o. female who is referred by Binnie Rail, MD for evaluation of palpitations.  .  She was in the ED for this and I reviewed these records.  The patient describes new onset tachypalpitations when she is ambulating.  This was new onset starting about 3 weeks ago.  She says she will walk up an incline and when she gets near the top her heart is fluttering and she is very fatigued.  This is definitely a change.  It did not happen suddenly but seem to be subacute.  She keeps going but she has to rest more.  She is not having chest pressure, neck or arm discomfort.  She is not having excessive shortness of breath.  She does not have any resting symptoms such as PND or orthopnea.  She has no weight gain or edema.  In the hospital there was not a mention of any arrhythmias noted.  She had a CT of her chest which was negative for any pulmonary embolism.  This was done because her d-dimer was elevated.   She has chronic anemia related to her leukemia but this was unchanged.  EKG demonstrated no arrhythmias and no acute changes.  She has not had any prior cardiac work-up.   The patient does not have symptoms concerning for COVID-19 infection (fever, chills, cough, or new shortness of breath).    Past Medical History:  Diagnosis Date  . Anemia requiring transfusions 2010 & 2015  . Arthritis    DJD  . Breast cancer ([place]) 1993   S/P lumpectomy , radiation, & Tamoxifen  . CML (chronic myeloid leukemia) (HCC)    Dr Alvy Bimler   Past Surgical History:  Procedure Laterality Date  . ABDOMINAL HYSTERECTOMY  ~1989   no BSO, for Dysplasia   . APPENDECTOMY  ~1989  . BILATERAL SALPINGOOPHORECTOMY  2010   with bowel resection  . BREAST SURGERY     Right lumpectomy, s/p radiation 1993 and oral  Tamoxifen x 5 years ([id number])   . CATARACT EXTRACTION  06/08/2014   left eye;Dr Herbert Deaner  . COLONOSCOPY  03/2003, last 2010   diverticulosis, Dr.Kaplan  . EXCISION MORTON'S NEUROMA Left mid 80's   foot  . gastrocslide     left leg; Dr Beola Cord  . ileostomy reversal  2011   3 mos after above  . KNEE SURGERY  mid 80's   Arthroscopy, right knee   . LEG TENDON SURGERY Left 2013   "cut  on leg and fixed something"  . OPEN SURGICAL REPAIR OF GLUTEAL TENDON Left 10/24/2016   Procedure: Left hip bursectomy; gluteal tendon repair;  Surgeon: Gaynelle Arabian, MD;  Location: [facility];  Service: Orthopedics;  Laterality: Left;  . sigmoid colon resection  2010    Dr Arlyce Dice, [place] for fistula & diverticulitis  . TOTAL KNEE ARTHROPLASTY Right 06/11/2013   Procedure: RIGHT TOTAL KNEE ARTHROPLASTY;  Surgeon: Sydnee Cabal, MD;  Location: [facility];  Service: Orthopedics;  Laterality: Right;  . TRIGGER FINGER RELEASE Left 2013   thumb     Current Meds  Medication Sig  . aspirin 81 MG tablet Take 81 mg by mouth daily.  . Biotin 10000 MCG TABS Take 1 tablet by mouth daily.   . Calcium Carb-Cholecalciferol (CALCIUM 600  + D PO) Take 1 tablet by mouth daily.  . Cyanocobalamin (B-12) 5000 MCG CAPS Take 1 tablet by mouth daily.  Marland Kitchen estradiol (ESTRACE VAGINAL) 0.1 MG/GM vaginal cream Place 1 Applicatorful vaginally 2 (two) times a week.  Marland Kitchen FINACEA 15 % cream Apply 1 application topically daily.  . folic acid (FOLVITE) 1 MG tablet TAKE 1 TABLET(1 MG) BY MOUTH DAILY  . Glucosamine-Chondroit-Vit C-Mn (GLUCOSAMINE CHONDR 1500 COMPLX) CAPS Take 1 capsule by mouth daily.  Marland Kitchen ibuprofen (ADVIL,MOTRIN) 200 MG tablet Take 400 mg by mouth every 6 (six) hours as needed for moderate pain.   Marland Kitchen imatinib (GLEEVEC) 100 MG tablet Take 3 tablets (300 mg total) by mouth daily.  . indomethacin (INDOCIN) 50 MG capsule Take 1 capsule (50 mg total) by mouth 3 (three) times daily with meals.  . Loperamide HCl (IMODIUM PO) Take 0.5-1 tablets by mouth 2 (two) times daily as needed (takes 0.5 tablet every morning and 2nd dose if needed for diarrhea).   . metoprolol tartrate (LOPRESSOR) 25 MG tablet Take 0.5 tablets (12.5 mg total) by mouth 2 (two) times daily.  . Multiple Vitamin (MULTIVITAMIN) tablet Take 1 tablet by mouth daily.  . Multiple Vitamins-Minerals (PRESERVISION AREDS 2) CAPS Take 1 capsule by mouth 2 (two) times a day.  . Probiotic Product (ALIGN) 4 MG CAPS Take 1 capsule by mouth daily.     Allergies:   Penicillins   Social History   Tobacco Use  . Smoking status: Former Smoker    Years: 5.00    Types: Cigarettes    Last attempt to quit: 04/30/1962    Years since quitting: 56.4  . Smokeless tobacco: Never Used  . Tobacco comment: smoked 1956-1964, up to 1/2 ppd  Substance Use Topics  . Alcohol use: Yes    Alcohol/week: 7.0 - 14.0 standard drinks    Types: 7 - 14 Glasses of wine per week  . Drug use: No     Family Hx: The patient's family history includes Aneurysm in her father; Breast cancer in her paternal aunt; Cancer in her father; Colon cancer (age of onset: 50) in her mother; Diabetes in her cousin; Heart attack  (age of onset: 41) in her paternal uncle; Uterine cancer in her sister. There is no history of Stroke, Esophageal cancer, Rectal cancer, or Stomach cancer.  ROS:   Please see the history of present illness.    As stated in the HPI and negative for all other systems.   Prior CV studies:   The following studies were reviewed today:  ED records  Labs/Other Tests and Data Reviewed:    EKG:  NSR, rate 82, axis within normal limits, intervals within normal limits, no acute  ST-T wave changes 09/13/2018  Recent Labs: 08/29/2018: TSH 1.506 09/13/2018: ALT 34; BUN 25; Creatinine, Ser 1.05; Hemoglobin 9.8; Platelets 135; Potassium 4.0; Sodium 139   Recent Lipid Panel Lab Results  Component Value Date/Time   CHOL 187 02/26/2017 10:03 AM   TRIG 59.0 02/26/2017 10:03 AM   HDL 83.00 02/26/2017 10:03 AM   CHOLHDL 2 02/26/2017 10:03 AM   LDLCALC 92 02/26/2017 10:03 AM    Wt Readings from Last 3 Encounters:  09/19/18 120 lb (54.4 kg)  09/13/18 120 lb (54.4 kg)  08/29/18 123 lb 3.2 oz (55.9 kg)     Objective:    Vital Signs:  BP 120/61   Pulse 72   Ht 5\' 1"  (1.549 m)   Wt 120 lb (54.4 kg)   BMI 22.67 kg/m    VITAL SIGNS:  reviewed  ASSESSMENT & PLAN:    PALPITATIONS:    This is the primary complaint.  I am not suspecting an acute coronary syndrome but rather a primary rhythm problem.  I like her to wear a 3-day event monitor and then see me after this.  I do note that the electrolytes and TSH were normal.  Further evaluation will be based on this.  ANEMIA: This appears to be baseline.  No change in therapy.  Time:   Today, I have spent 20 minutes with the patient with telehealth technology discussing the above problems.    Also time spent reviewing ED records   Medication Adjustments/Labs and Tests Ordered: Current medicines are reviewed at length with the patient today.  Concerns regarding medicines are outlined above.   Tests Ordered: Orders Placed This Encounter  Procedures   . LONG TERM MONITOR (3-14 DAYS)    Medication Changes: No orders of the defined types were placed in this encounter.   Disposition:  Follow up with me after the event monitor  Signed, Minus Breeding, MD  09/19/2018 2:32 PM    [place]

## 2018-09-18 NOTE — Telephone Encounter (Signed)
Phone visit, landline, pre-reg complete, MyChart active, Verbal consent given 09/18/2018 MS

## 2018-09-19 ENCOUNTER — Telehealth (INDEPENDENT_AMBULATORY_CARE_PROVIDER_SITE_OTHER): Payer: Medicare Other | Admitting: Cardiology

## 2018-09-19 ENCOUNTER — Telehealth: Payer: Self-pay | Admitting: Radiology

## 2018-09-19 ENCOUNTER — Encounter: Payer: Self-pay | Admitting: Cardiology

## 2018-09-19 VITALS — BP 120/61 | HR 72 | Ht 61.0 in | Wt 120.0 lb

## 2018-09-19 DIAGNOSIS — R002 Palpitations: Secondary | ICD-10-CM | POA: Diagnosis not present

## 2018-09-19 NOTE — Patient Instructions (Signed)
Medication Instructions:  Continue current medications  If you need a refill on your cardiac medications before your next appointment, please call your pharmacy.  Labwork: None Ordered   Testing/Procedures: Your physician has recommended that you wear a Zio Patch monitor. Holter monitors are medical devices that record the heart's electrical activity. Doctors most often use these monitors to diagnose arrhythmias. Arrhythmias are problems with the speed or rhythm of the heartbeat. The monitor is a small, portable device. You can wear one while you do your normal daily activities. This is usually used to diagnose what is causing palpitations/syncope (passing out).  Follow-Up: . Your physician recommends that you schedule a follow-up appointment in: 1 Months   At [facility], you and your health needs are our priority.  As part of our continuing mission to provide you with exceptional heart care, we have created designated Provider Care Teams.  These Care Teams include your primary Cardiologist (physician) and Advanced Practice Providers (APPs -  Physician Assistants and Nurse Practitioners) who all work together to provide you with the care you need, when you need it.  Thank you for choosing [facility] at [facility]!!

## 2018-09-19 NOTE — Telephone Encounter (Signed)
Enrolled patient for a 3 day Zio long term monitor to be mailed. Brief instructions were gone over with patient and she knows to expect the monitor to arrive in 3-4 days

## 2018-09-26 ENCOUNTER — Telehealth: Payer: Self-pay | Admitting: Cardiology

## 2018-09-26 NOTE — Telephone Encounter (Signed)
New Message   Patient states the first holter monitor was actually delivered today so could you call the company and have them cancel the second one they were going to send.

## 2018-09-26 NOTE — Telephone Encounter (Signed)
Message sent to monitor personnel

## 2018-09-26 NOTE — Telephone Encounter (Signed)
New Message    Pt is calling about the heart monitor and has questions   Please call

## 2018-09-26 NOTE — Telephone Encounter (Signed)
Pt notified she will CB if the monitor comes in the mail

## 2018-09-26 NOTE — Telephone Encounter (Signed)
Noted will let monitors know

## 2018-09-26 NOTE — Telephone Encounter (Signed)
Per [employer]holter staff the monitor must have gotten lost in the mail they will expedite out another ASAP. Pt notified she will CB if she does not receive

## 2018-09-26 NOTE — Telephone Encounter (Signed)
Returned call to pt she is concerned because she has not received her monitor in the mail yet. Her appt was 5-22. Sent message to [employer] employees, waiting for answer. will CB w/status

## 2018-09-27 ENCOUNTER — Ambulatory Visit (INDEPENDENT_AMBULATORY_CARE_PROVIDER_SITE_OTHER): Payer: Medicare Other

## 2018-09-27 DIAGNOSIS — R002 Palpitations: Secondary | ICD-10-CM | POA: Diagnosis not present

## 2018-10-08 ENCOUNTER — Telehealth: Payer: Self-pay | Admitting: Cardiology

## 2018-10-08 NOTE — Telephone Encounter (Signed)
Returned call to patient who reports Dr. Percival Spanish ordered a monitor for patient which she returned on 6/2. She is wanting the results as she and her husband are planning a trip where she will be gone for a few months. Results are not in epic for my viewing as of yet. Will route to MD & CMA

## 2018-10-08 NOTE — Telephone Encounter (Signed)
Patient wants to know what is happening with her case, she wants to know where we are.

## 2018-10-09 ENCOUNTER — Other Ambulatory Visit: Payer: Self-pay | Admitting: Hematology and Oncology

## 2018-10-09 DIAGNOSIS — C9210 Chronic myeloid leukemia, BCR/ABL-positive, not having achieved remission: Secondary | ICD-10-CM

## 2018-10-13 ENCOUNTER — Other Ambulatory Visit: Payer: Self-pay

## 2018-10-15 ENCOUNTER — Telehealth: Payer: Self-pay | Admitting: Cardiology

## 2018-10-15 NOTE — Telephone Encounter (Signed)
New Message     Pt is calling and is wanting to speak with a nurse  She has questions about her upcomming treatment    Please call

## 2018-10-15 NOTE — Telephone Encounter (Signed)
Called patient- she states she got a message from her Deloris Ping that she had an appointment on may 30th- but I advised that it was from her monitor and it was an old notification.   Patient also mentions needed her monitor results- advised that they were in, but had to be read by MD with recommendations. Patient verbalized understanding. States they will be going out of town until September and did not want to wait. I advised I would send message to MD to make aware.   Patient also wants to know if she should continue on her Metoprolol medication that was given to her in the hospital as she is needing a refill.  Please advise, thanks!

## 2018-10-17 ENCOUNTER — Other Ambulatory Visit: Payer: Self-pay | Admitting: Cardiology

## 2018-10-17 MED ORDER — METOPROLOL TARTRATE 25 MG PO TABS: 25.0000 mg | ORAL_TABLET | Freq: Two times a day (BID) | ORAL | 3 refills | Status: DC

## 2018-10-17 MED ORDER — METOPROLOL TARTRATE 25 MG PO TABS: 12.5000 mg | ORAL_TABLET | Freq: Two times a day (BID) | ORAL | 6 refills | Status: DC

## 2018-10-17 NOTE — Telephone Encounter (Signed)
-----   Message from Minus Breeding, MD sent at 10/16/2018  3:16 PM EDT ----- She does have SVT.   These were not long but likely causing her symptoms.   She could try to increase her beta blocker to 25 mg po BID.  Call Ms. Ferger with the results and send results to Binnie Rail, MD

## 2018-10-17 NOTE — Telephone Encounter (Signed)
Advised patient, verbalized understanding Patient stated she was going to [place] end of next week for 3 months and wanted to know if she needed to be seen before then.  There is no change in how she is feeling and she continues to walk for exercise   Will forward to Dr Percival Spanish for review

## 2018-10-17 NOTE — Telephone Encounter (Signed)
See phone note 6/17, monitor results given to patient

## 2018-10-17 NOTE — Telephone Encounter (Signed)
°*  STAT* If patient is at the pharmacy, call can be transferred to refill team.   1. Which medications need to be refilled? (please list name of each medication and dose if known) new prescription for Metoprolol  2. Which pharmacy/location (including street and city if local pharmacy) is medication to be sent to?[facility]---[phone]  3. Do they need a 30 day or 90 day supply? 30 days

## 2018-10-24 ENCOUNTER — Telehealth: Payer: Self-pay | Admitting: Cardiology

## 2018-10-24 NOTE — Telephone Encounter (Signed)
Called patient, advised of monitor results, patient just wanted it explained again.  Patient is aware, and did not have any other questions, she asked if she should see him before they left- I did advised that message was already sent to MD and once we received his answer we would let her know.  Patient verbalized understanding.

## 2018-10-24 NOTE — Telephone Encounter (Signed)
New message   Patient would like to have a return call to go over hear monitor test. Please call.

## 2018-10-24 NOTE — Telephone Encounter (Signed)
Discussed with Dr Percival Spanish and ok to follow up when she returns since she is going out of town tomorrow. If any issues while she is away virtual visit can be done then. Advised patient, verbalized understanding.

## 2018-12-24 ENCOUNTER — Telehealth: Payer: Self-pay

## 2018-12-24 ENCOUNTER — Other Ambulatory Visit: Payer: Self-pay | Admitting: Hematology and Oncology

## 2018-12-24 DIAGNOSIS — C9210 Chronic myeloid leukemia, BCR/ABL-positive, not having achieved remission: Secondary | ICD-10-CM

## 2018-12-24 NOTE — Telephone Encounter (Signed)
I sent scheduling msg 

## 2018-12-24 NOTE — Telephone Encounter (Signed)
Pt called and said she's been out of town for a while and thinks that she is supposed to schedule an appt with you. Please advise.

## 2018-12-25 ENCOUNTER — Telehealth: Payer: Self-pay | Admitting: Hematology and Oncology

## 2018-12-25 ENCOUNTER — Telehealth: Payer: Self-pay

## 2018-12-25 NOTE — Telephone Encounter (Signed)
Scheduled appt per 8/26 - unable to reach pt and unable to leave message - . Message sent to MD so she is aware.

## 2018-12-25 NOTE — Telephone Encounter (Signed)
-----   Message from Heath Lark, MD sent at 12/25/2018 11:35 AM EDT ----- Vonna Kotyk,  Please try to call ----- Message ----- From: Nicholaus Corolla Sent: 12/25/2018  11:24 AM EDT To: Heath Lark, MD  Scheduling Message Entered by [facility], [place] on 12/24/2018 at 12:31 PM Priority: Routine EST PT 15 Department: [facility] Provider: Heath Lark, MD Scheduling Notes: labs this week see me about 10 days after labs  I scheduled these appts but unable to reach pt or leave message. Not sure if you want to have RN try to contact patient .   It looks like she is my chart active so i'm hoping her /husband will see the appts.

## 2018-12-25 NOTE — Telephone Encounter (Signed)
Called and given below message with appt times. She verbalized understanding.

## 2018-12-26 ENCOUNTER — Inpatient Hospital Stay: Payer: Medicare Other | Attending: Hematology and Oncology

## 2018-12-26 ENCOUNTER — Other Ambulatory Visit: Payer: Self-pay

## 2018-12-26 DIAGNOSIS — C9210 Chronic myeloid leukemia, BCR/ABL-positive, not having achieved remission: Secondary | ICD-10-CM | POA: Diagnosis not present

## 2018-12-26 LAB — CBC WITH DIFFERENTIAL/PLATELET
Abs Immature Granulocytes: 0.01 10*3/uL (ref 0.00–0.07)
Basophils Absolute: 0 10*3/uL (ref 0.0–0.1)
Basophils Relative: 1 %
Eosinophils Absolute: 0.1 10*3/uL (ref 0.0–0.5)
Eosinophils Relative: 1 %
HCT: 29.9 % — ABNORMAL LOW (ref 36.0–46.0)
Hemoglobin: 10.3 g/dL — ABNORMAL LOW (ref 12.0–15.0)
Immature Granulocytes: 0 %
Lymphocytes Relative: 23 %
Lymphs Abs: 0.9 10*3/uL (ref 0.7–4.0)
MCH: 38.1 pg — ABNORMAL HIGH (ref 26.0–34.0)
MCHC: 34.4 g/dL (ref 30.0–36.0)
MCV: 110.7 fL — ABNORMAL HIGH (ref 80.0–100.0)
Monocytes Absolute: 0.6 10*3/uL (ref 0.1–1.0)
Monocytes Relative: 14 %
Neutro Abs: 2.6 10*3/uL (ref 1.7–7.7)
Neutrophils Relative %: 61 %
Platelets: 147 10*3/uL — ABNORMAL LOW (ref 150–400)
RBC: 2.7 MIL/uL — ABNORMAL LOW (ref 3.87–5.11)
RDW: 13.3 % (ref 11.5–15.5)
WBC: 4.2 10*3/uL (ref 4.0–10.5)
nRBC: 0 % (ref 0.0–0.2)

## 2018-12-26 LAB — COMPREHENSIVE METABOLIC PANEL
ALT: 18 U/L (ref 0–44)
AST: 35 U/L (ref 15–41)
Albumin: 4 g/dL (ref 3.5–5.0)
Alkaline Phosphatase: 60 U/L (ref 38–126)
Anion gap: 7 (ref 5–15)
BUN: 23 mg/dL (ref 8–23)
CO2: 29 mmol/L (ref 22–32)
Calcium: 9.3 mg/dL (ref 8.9–10.3)
Chloride: 101 mmol/L (ref 98–111)
Creatinine, Ser: 1.11 mg/dL — ABNORMAL HIGH (ref 0.44–1.00)
GFR calc Af Amer: 54 mL/min — ABNORMAL LOW (ref >60)
GFR calc non Af Amer: 46 mL/min — ABNORMAL LOW (ref >60)
Glucose, Bld: 101 mg/dL — ABNORMAL HIGH (ref 70–99)
Potassium: 4.8 mmol/L (ref 3.5–5.1)
Sodium: 137 mmol/L (ref 135–145)
Total Bilirubin: 0.5 mg/dL (ref 0.3–1.2)
Total Protein: 6.7 g/dL (ref 6.5–8.1)

## 2019-01-04 ENCOUNTER — Other Ambulatory Visit: Payer: Self-pay | Admitting: Internal Medicine

## 2019-01-06 ENCOUNTER — Other Ambulatory Visit: Payer: Self-pay | Admitting: Internal Medicine

## 2019-01-06 ENCOUNTER — Inpatient Hospital Stay: Payer: Medicare Other | Attending: Hematology and Oncology | Admitting: Hematology and Oncology

## 2019-01-06 ENCOUNTER — Other Ambulatory Visit: Payer: Self-pay

## 2019-01-06 DIAGNOSIS — M25552 Pain in left hip: Secondary | ICD-10-CM

## 2019-01-06 DIAGNOSIS — Z79899 Other long term (current) drug therapy: Secondary | ICD-10-CM | POA: Diagnosis not present

## 2019-01-06 DIAGNOSIS — C9210 Chronic myeloid leukemia, BCR/ABL-positive, not having achieved remission: Secondary | ICD-10-CM | POA: Diagnosis present

## 2019-01-06 DIAGNOSIS — G8929 Other chronic pain: Secondary | ICD-10-CM | POA: Insufficient documentation

## 2019-01-06 DIAGNOSIS — D61810 Antineoplastic chemotherapy induced pancytopenia: Secondary | ICD-10-CM

## 2019-01-06 DIAGNOSIS — T451X5A Adverse effect of antineoplastic and immunosuppressive drugs, initial encounter: Secondary | ICD-10-CM

## 2019-01-07 ENCOUNTER — Telehealth: Payer: Self-pay | Admitting: Hematology and Oncology

## 2019-01-07 ENCOUNTER — Encounter: Payer: Self-pay | Admitting: Hematology and Oncology

## 2019-01-07 DIAGNOSIS — M25552 Pain in left hip: Secondary | ICD-10-CM | POA: Insufficient documentation

## 2019-01-07 NOTE — Assessment & Plan Note (Signed)
Her blood count is stable She has mild pancytopenia with treatment but she is not symptomatic BCR/ABL is detected but she remained in major molecular response We will continue treatment indefinitely I will schedule return appointment in 3 to 4 months

## 2019-01-07 NOTE — Assessment & Plan Note (Signed)
She has chronic left hip pain This is not related to side effects of treatment I would defer to her orthopedic surgeon for further management

## 2019-01-07 NOTE — Progress Notes (Signed)
[facility] OFFICE PROGRESS NOTE  Patient Care Team: Binnie Rail, MD as PCP - General (Internal Medicine) Minus Breeding, MD as PCP - Cardiology (Cardiology)  ASSESSMENT & PLAN:  Chronic myeloid leukemia Her blood count is stable She has mild pancytopenia with treatment but she is not symptomatic BCR/ABL is detected but she remained in major molecular response We will continue treatment indefinitely I will schedule return appointment in 3 to 4 months  Pancytopenia due to antineoplastic chemotherapy [facility]) She has stable pancytopenia Serum vitamin B12 and thyroid function were within normal limits She will continue Gleevec as prescribed  Left hip pain She has chronic left hip pain This is not related to side effects of treatment I would defer to her orthopedic surgeon for further management   No orders of the defined types were placed in this encounter.   INTERVAL HISTORY: Please see below for problem oriented charting. She returns for further follow-up for [facility] She feels well She has been complaining of intermittent left hip pain She has appointment to see orthopedic surgeon No recent infection, fever or chills Denies shortness of breath  SUMMARY OF ONCOLOGIC HISTORY: Oncology History  Chronic myeloid leukemia ([place])  02/18/2007 Initial Diagnosis   Chronic myeloid leukemia   08/20/2014 Tumor Marker   BCR/ABL is undetectable   03/04/2015 Pathology Results   BCR/ABL is undetectable   08/29/2015 Tumor Marker   BCR/ABL is undetectable   02/29/2016 Pathology Results   BCR/ABL is undetectable   05/01/2016 Pathology Results   BCR/ABL b2a2 is detected at 0.03% and IS ratio 0.069%   05/31/2016 Pathology Results   BCR/ABL b2a2 is detected at 0.12% and IS ratio 0.276%   06/19/2016 Pathology Results   BCR/ABL b2a2 is detected at 0.24% and IS ratio 0.552%   06/19/2016 Miscellaneous   ABL mutation kinase testing is negative   06/27/2016 Miscellaneous   Dose  of Gleevec is increased to 600 mg daily   07/25/2016 Pathology Results   BCR/ABL b2a2 is detected at 0.03% and IS ratio 0.069%. She has achieved MMR   08/30/2016 Miscellaneous   Dose of Gleevec is reduced to 300 mg daily and subsequently down to 200 mg daily   10/22/2016 Pathology Results   BCR/ABL b2a2 is not detected. She has achieved MMR   01/03/2017 Pathology Results   BCR/ABL b2a2 is detected IS ratio 0.041%   02/18/2017 Pathology Results   BCR/ABL b2a2 is detected IS ratio 0.0126   05/21/2017 Pathology Results   BCR/ABL b2a2 is not detected. She has achieved MMR   08/20/2017 Pathology Results   BCR/ABL b2a2 is not detected. She has achieved MMR   11/12/2017 Pathology Results   BCR/ABL b2a2 is not detected   04/07/2018 Pathology Results   BCR/ABL b2a2 is detected IS ratio 0.0062. She is in MMR   06/27/2018 Pathology Results   BCR/ABL b2a2 is detected IS ratio 0.0097%. She is in MMR   08/29/2018 Pathology Results   BCR/ABL is undetectable. She is in MMR   12/26/2018 Pathology Results   BCR/ABL b2a2 is detected IS ratio 0.0042%. She is in MMR     REVIEW OF SYSTEMS:   Constitutional: Denies fevers, chills or abnormal weight loss Eyes: Denies blurriness of vision Ears, nose, mouth, throat, and face: Denies mucositis or sore throat Respiratory: Denies cough, dyspnea or wheezes Cardiovascular: Denies palpitation, chest discomfort or lower extremity swelling Gastrointestinal:  Denies nausea, heartburn or change in bowel habits Skin: Denies abnormal skin rashes Lymphatics: Denies new lymphadenopathy  or easy bruising Neurological:Denies numbness, tingling or new weaknesses Behavioral/Psych: Mood is stable, no new changes  All other systems were reviewed with the patient and are negative.  I have reviewed the past medical history, past surgical history, social history and family history with the patient and they are unchanged from previous note.  ALLERGIES:  is allergic to  penicillins.  MEDICATIONS:  Current Outpatient Medications  Medication Sig Dispense Refill  . aspirin 81 MG tablet Take 81 mg by mouth daily.    . Biotin 10000 MCG TABS Take 1 tablet by mouth daily.     . Calcium Carb-Cholecalciferol (CALCIUM 600 + D PO) Take 1 tablet by mouth daily.    . Cyanocobalamin (B-12) 5000 MCG CAPS Take 1 tablet by mouth daily.    Marland Kitchen estradiol (ESTRACE) 0.1 MG/GM vaginal cream Insert 1 applicator full 2 times weekly. Need office visit for more refills. 42.5 g 0  . FINACEA 15 % cream Apply 1 application topically daily.  2  . folic acid (FOLVITE) 1 MG tablet TAKE 1 TABLET(1 MG) BY MOUTH DAILY. Need office visit for more refills 30 tablet 0  . Glucosamine-Chondroit-Vit C-Mn (GLUCOSAMINE CHONDR 1500 COMPLX) CAPS Take 1 capsule by mouth daily.    Marland Kitchen ibuprofen (ADVIL,MOTRIN) 200 MG tablet Take 400 mg by mouth every 6 (six) hours as needed for moderate pain.     Marland Kitchen imatinib (GLEEVEC) 100 MG tablet TAKE 3 TABLETS(300 MG) BY MOUTH DAILY 270 tablet 3  . indomethacin (INDOCIN) 50 MG capsule Take 1 capsule (50 mg total) by mouth 3 (three) times daily with meals. 15 capsule 0  . Loperamide HCl (IMODIUM PO) Take 0.5-1 tablets by mouth 2 (two) times daily as needed (takes 0.5 tablet every morning and 2nd dose if needed for diarrhea).     . metoprolol tartrate (LOPRESSOR) 25 MG tablet Take 1 tablet (25 mg total) by mouth 2 (two) times daily. 180 tablet 3  . Multiple Vitamin (MULTIVITAMIN) tablet Take 1 tablet by mouth daily.    . Multiple Vitamins-Minerals (PRESERVISION AREDS 2) CAPS Take 1 capsule by mouth 2 (two) times a day.    . Probiotic Product (ALIGN) 4 MG CAPS Take 1 capsule by mouth daily.     No current facility-administered medications for this visit.     PHYSICAL EXAMINATION: ECOG PERFORMANCE STATUS: 1 - Symptomatic but completely ambulatory  Vitals:   01/06/19 1149  BP: (!) 118/59  Pulse: 61  Resp: 18  Temp: 98.3 F (36.8 C)  SpO2: 100%   Filed Weights    01/06/19 1149  Weight: 125 lb 6.4 oz (56.9 kg)    GENERAL:alert, no distress and comfortable SKIN: skin color, texture, turgor are normal, no rashes or significant lesions EYES: normal, Conjunctiva are pink and non-injected, sclera clear OROPHARYNX:no exudate, no erythema and lips, buccal mucosa, and tongue normal  NECK: supple, thyroid normal size, non-tender, without nodularity LYMPH:  no palpable lymphadenopathy in the cervical, axillary or inguinal LUNGS: clear to auscultation and percussion with normal breathing effort HEART: regular rate & rhythm and no murmurs and no lower extremity edema ABDOMEN:abdomen soft, non-tender and normal bowel sounds Musculoskeletal:no cyanosis of digits and no clubbing  NEURO: alert & oriented x 3 with fluent speech, no focal motor/sensory deficits  LABORATORY DATA:  I have reviewed the data as listed    Component Value Date/Time   NA 137 12/26/2018 1135   NA 140 02/18/2017 0808   K 4.8 12/26/2018 1135   K 4.4 02/18/2017 7371  CL 101 12/26/2018 1135   CL 105 09/04/2012 1006   CO2 29 12/26/2018 1135   CO2 29 02/18/2017 0808   GLUCOSE 101 (H) 12/26/2018 1135   GLUCOSE 86 02/18/2017 0808   GLUCOSE 96 09/04/2012 1006   BUN 23 12/26/2018 1135   BUN 20.3 02/18/2017 0808   CREATININE 1.11 (H) 12/26/2018 1135   CREATININE 1.1 02/18/2017 0808   CALCIUM 9.3 12/26/2018 1135   CALCIUM 9.4 02/18/2017 0808   PROT 6.7 12/26/2018 1135   PROT 6.6 02/18/2017 0808   ALBUMIN 4.0 12/26/2018 1135   ALBUMIN 4.2 02/18/2017 0808   AST 35 12/26/2018 1135   AST 34 02/18/2017 0808   ALT 18 12/26/2018 1135   ALT 18 02/18/2017 0808   ALKPHOS 60 12/26/2018 1135   ALKPHOS 53 02/18/2017 0808   BILITOT 0.5 12/26/2018 1135   BILITOT 0.76 02/18/2017 0808   GFRNONAA 46 (L) 12/26/2018 1135   GFRAA 54 (L) 12/26/2018 1135    No results found for: SPEP, UPEP  Lab Results  Component Value Date   WBC 4.2 12/26/2018   NEUTROABS 2.6 12/26/2018   HGB 10.3 (L)  12/26/2018   HCT 29.9 (L) 12/26/2018   MCV 110.7 (H) 12/26/2018   PLT 147 (L) 12/26/2018      Chemistry      Component Value Date/Time   NA 137 12/26/2018 1135   NA 140 02/18/2017 0808   K 4.8 12/26/2018 1135   K 4.4 02/18/2017 0808   CL 101 12/26/2018 1135   CL 105 09/04/2012 1006   CO2 29 12/26/2018 1135   CO2 29 02/18/2017 0808   BUN 23 12/26/2018 1135   BUN 20.3 02/18/2017 0808   CREATININE 1.11 (H) 12/26/2018 1135   CREATININE 1.1 02/18/2017 0808      Component Value Date/Time   CALCIUM 9.3 12/26/2018 1135   CALCIUM 9.4 02/18/2017 0808   ALKPHOS 60 12/26/2018 1135   ALKPHOS 53 02/18/2017 0808   AST 35 12/26/2018 1135   AST 34 02/18/2017 0808   ALT 18 12/26/2018 1135   ALT 18 02/18/2017 0808   BILITOT 0.5 12/26/2018 1135   BILITOT 0.76 02/18/2017 0808       All questions were answered. The patient knows to call the clinic with any problems, questions or concerns. No barriers to learning was detected.  I spent 15 minutes counseling the patient face to face. The total time spent in the appointment was 20 minutes and more than 50% was on counseling and review of test results  Heath Lark, MD 01/07/2019 12:01 PM

## 2019-01-07 NOTE — Assessment & Plan Note (Signed)
She has stable pancytopenia Serum vitamin B12 and thyroid function were within normal limits She will continue Gleevec as prescribed 

## 2019-01-07 NOTE — Telephone Encounter (Signed)
I talk with patient regarding schedule  

## 2019-01-16 LAB — BCR/ABL

## 2019-01-20 DIAGNOSIS — M1612 Unilateral primary osteoarthritis, left hip: Secondary | ICD-10-CM | POA: Insufficient documentation

## 2019-01-26 ENCOUNTER — Telehealth: Payer: Self-pay | Admitting: *Deleted

## 2019-01-26 NOTE — Telephone Encounter (Signed)
   [facility] Pre-operative Risk Assessment    Request for surgical clearance:  1. What type of surgery is being performed? LEFT TOTAL HIP ARTHROPLASTY   2. When is this surgery scheduled? 03/02/19   3. What type of clearance is required (medical clearance vs. Pharmacy clearance to hold med vs. Both)? MEDICAL  4. Are there any medications that need to be held prior to surgery and how long? ASA    5. Practice name and name of physician performing surgery? [facility]; DR. FRANK ALUISIO   6. What is your office phone number [phone]    7.   What is your office fax number [phone]  8.   Anesthesia type (None, local, MAC, general) ? CHOICE   Julaine Hua 01/26/2019, 8:47 AM  _________________________________________________________________   (provider comments below)

## 2019-01-26 NOTE — Telephone Encounter (Signed)
   Primary Cardiologist: Minus Breeding, MD  Chart reviewed as part of pre-operative protocol coverage. Patient was contacted 01/26/2019 in reference to pre-operative risk assessment for pending surgery as outlined below.  [name] [name] was last seen on 09/19/18 by Dr. Percival Spanish.  Since that day, [name] [name] has done well.  She does not have a history of MI or stroke. Her mobility is limited by hip pain, but she can still complete 4.0 METS without anginal symptoms. She may hold ASA 5-7 days prior to the procedure.  Therefore, based on ACC/AHA guidelines, the patient would be at acceptable risk for the planned procedure without further cardiovascular testing.   I will route this recommendation to the requesting party via Epic fax function and remove from pre-op pool.  Please call with questions.  Tami Lin Sister Carbone, PA 01/26/2019, 1:32 PM

## 2019-01-28 ENCOUNTER — Telehealth: Payer: Self-pay | Admitting: *Deleted

## 2019-01-28 NOTE — Telephone Encounter (Signed)
Received medical clearance for patient to have Left hip arthroplasty. Form filled out- no contraindication for surgery from our specialty. Patient will need to hold Gleevac 1 week prior to her surgery date. Discussed with patient holding her medication on 10/25 and resuming the day she is discharged from the hospital. Patient repeated instructions back and verbalized an understanding. She appreciated call. Form faxed back to [facility] attn: Glendale Chard. Fax confirmation received.

## 2019-02-05 NOTE — Progress Notes (Signed)
Subjective:    Patient ID: [name] [name], female    DOB: 1934-11-07, 84 y.o.   MRN: [id number]  HPI She is here for pre-operative clearance at the request of Dr Wynelle Link for left total hip arthroplasty with anterior approach scheduled for 03/02/2019.  Conservative measures have failed.   She denies any personal or family history of problems with anesthesia or bleeding/blood clot problems.    She has no concerns.  She is taking all his medication as prescribed.   She is not exercising regularly due to the hip pain.  With her daily activities she denies chest pain, palpitations, SOB and lightheadedness.    She has no concerns.   Medications and allergies reviewed with patient and updated if appropriate.  Patient Active Problem List   Diagnosis Date Noted  . Left hip pain 01/07/2019  . Iron deficiency anemia 11/21/2017  . Pain involving joint of finger of right hand 10/22/2017  . Vaginal atrophy 10/15/2017  . B12 deficiency 02/26/2017  . Bone lesion 09/18/2016  . Urinary incontinence 04/11/2016  . Vitamin D deficiency 10/02/2015  . Other fatigue 04/07/2015  . History of colonic polyps 08/06/2014  . Thrombocytopenia due to drugs 02/18/2014  . Leukopenia due to antineoplastic chemotherapy ([place]) 02/18/2014  . 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 History of breast cancer 07/18/2012  . Osteopenia 09/15/2011  . Diarrhea 08/29/2011  . Family history of malignant neoplasm of gastrointestinal tract 08/29/2011  . Hyperlipidemia 04/14/2009  . Pancytopenia due to antineoplastic chemotherapy ([place]) 04/14/2009  . DIVERTICULOSIS OF COLON 04/06/2008  . Chronic myeloid leukemia ([place]) 02/18/2007  . ROSACEA 02/18/2007    Current Outpatient Medications on File Prior to Visit  Medication Sig Dispense Refill  . aspirin 81 MG tablet Take 81 mg by mouth daily.    . Biotin 10000 MCG TABS Take 1 tablet by mouth daily.     . Calcium Carb-Cholecalciferol (CALCIUM 600 + D PO) Take 1 tablet by mouth daily.     . Cyanocobalamin (B-12) 5000 MCG CAPS Take 1 tablet by mouth daily.    Marland Kitchen estradiol (ESTRACE) 0.1 MG/GM vaginal cream Insert 1 applicator full 2 times weekly. Need office visit for more refills. 42.5 g 0  . FINACEA 15 % cream Apply 1 application topically daily.  2  . folic acid (FOLVITE) 1 MG tablet TAKE 1 TABLET(1 MG) BY MOUTH DAILY. Need office visit for more refills 30 tablet 0  . Glucosamine-Chondroit-Vit C-Mn (GLUCOSAMINE CHONDR 1500 COMPLX) CAPS Take 1 capsule by mouth daily.    Marland Kitchen ibuprofen (ADVIL,MOTRIN) 200 MG tablet Take 400 mg by mouth every 6 (six) hours as needed for moderate pain.     Marland Kitchen imatinib (GLEEVEC) 100 MG tablet TAKE 3 TABLETS(300 MG) BY MOUTH DAILY 270 tablet 3  . indomethacin (INDOCIN) 50 MG capsule Take 1 capsule (50 mg total) by mouth 3 (three) times daily with meals. 15 capsule 0  . Loperamide HCl (IMODIUM PO) Take 0.5-1 tablets by mouth 2 (two) times daily as needed (takes 0.5 tablet every morning and 2nd dose if needed for diarrhea).     . metoprolol tartrate (LOPRESSOR) 25 MG tablet Take 1 tablet (25 mg total) by mouth 2 (two) times daily. 180 tablet 3  . Multiple Vitamin (MULTIVITAMIN) tablet Take 1 tablet by mouth daily.    . Multiple Vitamins-Minerals (PRESERVISION AREDS 2) CAPS Take 1 capsule by mouth 2 (two) times a day.    . Probiotic Product (ALIGN) 4 MG CAPS Take 1 capsule  by mouth daily.     No current facility-administered medications on file prior to visit.     Past Medical History:  Diagnosis Date  . Anemia requiring transfusions 2010 & 2015  . Arthritis    DJD  . Breast cancer ([place]) 1993   S/P lumpectomy , radiation, & Tamoxifen  . CML (chronic myeloid leukemia) ([facility])    Dr Alvy Bimler    Past Surgical History:  Procedure Laterality Date  . ABDOMINAL HYSTERECTOMY  ~1989   no BSO, for Dysplasia   . APPENDECTOMY  ~1989  . BILATERAL SALPINGOOPHORECTOMY  2010   with bowel resection  . BREAST SURGERY     Right lumpectomy, s/p radiation 1993 and  oral  Tamoxifen x 5 years [id number])   . CATARACT EXTRACTION  06/08/2014   left eye;Dr Herbert Deaner  . COLONOSCOPY  03/2003, last 2010   diverticulosis, Dr.Kaplan  . EXCISION MORTON'S NEUROMA Left mid 80's   foot  . gastrocslide     left leg; Dr Beola Cord  . ileostomy reversal  2011   3 mos after above  . KNEE SURGERY  mid 80's   Arthroscopy, right knee   . LEG TENDON SURGERY Left 2013   "cut on leg and fixed something"  . OPEN SURGICAL REPAIR OF GLUTEAL TENDON Left 10/24/2016   Procedure: Left hip bursectomy; gluteal tendon repair;  Surgeon: Gaynelle Arabian, MD;  Location: [facility];  Service: Orthopedics;  Laterality: Left;  . sigmoid colon resection  2010    Dr Arlyce Dice, [place] for fistula & diverticulitis  . TOTAL KNEE ARTHROPLASTY Right 06/11/2013   Procedure: RIGHT TOTAL KNEE ARTHROPLASTY;  Surgeon: Sydnee Cabal, MD;  Location: [facility];  Service: Orthopedics;  Laterality: Right;  . TRIGGER FINGER RELEASE Left 2013   thumb    Social History   Socioeconomic History  . Marital status: Married    Spouse name: Not on file  . Number of children: Not on file  . Years of education: Not on file  . Highest education level: Not on file  Occupational History  . Not on file  Social Needs  . Financial resource strain: Not on file  . Food insecurity    Worry: Not on file    Inability: Not on file  . Transportation needs    Medical: Not on file    Non-medical: Not on file  Tobacco Use  . Smoking status: Former Smoker    Years: 5.00    Types: Cigarettes    Quit date: 04/30/1962    Years since quitting: 56.8  . Smokeless tobacco: Never Used  . Tobacco comment: smoked 1956-1964, up to 1/2 ppd  Substance and Sexual Activity  . Alcohol use: Yes    Alcohol/week: 7.0 - 14.0 standard drinks    Types: 7 - 14 Glasses of wine per week  . Drug use: No  . Sexual activity: Yes  Lifestyle  . Physical activity    Days per week: Not on file    Minutes per session: Not on file  . Stress: Not on  file  Relationships  . Social Herbalist on phone: Not on file    Gets together: Not on file    Attends religious service: Not on file    Active member of club or organization: Not on file    Attends meetings of clubs or organizations: Not on file    Relationship status: Not on file  Other Topics Concern  . Not on file  Social History  Narrative   Married,     Family History  Problem Relation Age of Onset  . Colon cancer Mother 46  . Uterine cancer Sister   . Diabetes Cousin   . Cancer Father        Lip cancer  . Aneurysm Father        AAA;S/P surgery  . Breast cancer Paternal Aunt   . Heart attack Paternal Uncle 50  . Stroke Neg Hx   . Esophageal cancer Neg Hx   . Rectal cancer Neg Hx   . Stomach cancer Neg Hx     Review of Systems  Constitutional: Negative for chills and fever.  Eyes: Negative for visual disturbance.  Respiratory: Negative for cough, shortness of breath and wheezing.   Cardiovascular: Negative for chest pain, palpitations and leg swelling.  Gastrointestinal: Positive for diarrhea. Negative for abdominal pain, blood in stool, constipation and nausea.       No gerd  Genitourinary: Negative for dysuria and hematuria.  Musculoskeletal: Positive for arthralgias.  Skin: Negative for rash.  Neurological: Negative for dizziness, light-headedness, numbness and headaches.       Objective:   Vitals:   02/06/19 1401  BP: 110/62  Pulse: 68  Temp: 98.3 F (36.8 C)  SpO2: 98%   Filed Weights   02/06/19 1401  Weight: 121 lb (54.9 kg)   Body mass index is 22.49 kg/m.  BP Readings from Last 3 Encounters:  02/06/19 110/62  01/06/19 (!) 118/59  09/19/18 120/61    Wt Readings from Last 3 Encounters:  02/06/19 121 lb (54.9 kg)  01/06/19 125 lb 6.4 oz (56.9 kg)  09/19/18 120 lb (54.4 kg)     Physical Exam Constitutional: She appears well-developed and well-nourished. No distress.  HENT:  Head: Normocephalic and atraumatic.  Right  Ear: External ear normal. Normal ear canal and TM Left Ear: External ear normal.  Normal ear canal and TM Mouth/Throat: Oropharynx is clear and moist.  Eyes: Conjunctivae and EOM are normal.  Neck: Neck supple. No tracheal deviation present. No thyromegaly present.  No carotid bruit  Cardiovascular: Normal rate, regular rhythm and normal heart sounds.  No murmur heard.  Trace b/l LE edema. Pulmonary/Chest: Effort normal and breath sounds normal. No respiratory distress. She has no wheezes. She has no rales.  Abdominal: Soft. She exhibits no distension. There is no tenderness.  Lymphadenopathy: She has no cervical adenopathy.  Skin: Skin is warm and dry. She is not diaphoretic.  Psychiatric: She has a normal mood and affect. Her behavior is normal.        Assessment & Plan:   Flu vaccine today  See Problem List for Assessment and Plan of chronic medical problems.

## 2019-02-06 ENCOUNTER — Encounter: Payer: Self-pay | Admitting: Internal Medicine

## 2019-02-06 ENCOUNTER — Ambulatory Visit (INDEPENDENT_AMBULATORY_CARE_PROVIDER_SITE_OTHER): Payer: Medicare Other | Admitting: Internal Medicine

## 2019-02-06 ENCOUNTER — Other Ambulatory Visit: Payer: Self-pay

## 2019-02-06 VITALS — BP 110/62 | HR 68 | Temp 98.3°F | Ht 61.5 in | Wt 121.0 lb

## 2019-02-06 DIAGNOSIS — Z01818 Encounter for other preprocedural examination: Secondary | ICD-10-CM | POA: Diagnosis not present

## 2019-02-06 DIAGNOSIS — Z23 Encounter for immunization: Secondary | ICD-10-CM

## 2019-02-06 NOTE — Patient Instructions (Signed)
Continue your current medication  Flu immunization administered today.      Good luck with surgery.

## 2019-02-06 NOTE — Assessment & Plan Note (Addendum)
Low risk for surgery She has mild pancytopenia - chronic from treatment of CLL - stable - no need to recheck labs today given chronicity and stability All medical problems stable and controlled No history of coronary artery disease or chronic lung disease and no symptoms suggestive of either No further evaluation or testing needed Advised surgery but she is low risk

## 2019-02-09 ENCOUNTER — Other Ambulatory Visit: Payer: Self-pay | Admitting: Internal Medicine

## 2019-02-25 ENCOUNTER — Encounter (HOSPITAL_COMMUNITY): Admission: RE | Admit: 2019-02-25 | Payer: Medicare Other | Source: Ambulatory Visit

## 2019-02-25 NOTE — H&P (Signed)
TOTAL HIP ADMISSION H&P  Patient is admitted for left total hip arthroplasty.  Subjective:  Chief Complaint: left hip pain  HPI: [name] [name], 84 y.o. female, has a history of pain and functional disability in the left hip(s) due to arthritis and patient has failed non-surgical conservative treatments for greater than 12 weeks to include NSAID's and/or analgesics, corticosteriod injections, flexibility and strengthening excercises and activity modification.  Onset of symptoms was gradual starting 3 years ago with gradually worsening course since that time.The patient noted no past surgery on the left hip(s).  Patient currently rates pain in the left hip at 3 out of 10 with activity. Patient has night pain, worsening of pain with activity and weight bearing, pain that interfers with activities of daily living and pain with passive range of motion. Patient has evidence of periarticular osteophytes and joint space narrowing by imaging studies. This condition presents safety issues increasing the risk of falls.  There is no current active infection.  Patient Active Problem List   Diagnosis Date Noted  . Preop examination 02/06/2019  . Left hip pain 01/07/2019  . Iron deficiency anemia 11/21/2017  . Pain involving joint of finger of right hand 10/22/2017  . Vaginal atrophy 10/15/2017  . B12 deficiency 02/26/2017  . Bone lesion 09/18/2016  . Urinary incontinence 04/11/2016  . Vitamin D deficiency 10/02/2015  . Other fatigue 04/07/2015  . History of colonic polyps 08/06/2014  . Thrombocytopenia due to drugs 02/18/2014  . Leukopenia due to antineoplastic chemotherapy ([place]) 02/18/2014  . 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 History of breast cancer 07/18/2012  . Osteopenia 09/15/2011  . Diarrhea 08/29/2011  . Family history of malignant neoplasm of gastrointestinal tract 08/29/2011  . Hyperlipidemia 04/14/2009  . Pancytopenia due to antineoplastic chemotherapy ([place]) 04/14/2009  . DIVERTICULOSIS  OF COLON 04/06/2008  . Chronic myeloid leukemia ([place]) 02/18/2007  . ROSACEA 02/18/2007   Past Medical History:  Diagnosis Date  . Anemia requiring transfusions 2010 & 2015  . Arthritis    DJD  . Breast cancer ([place]) 1993   S/P lumpectomy , radiation, & Tamoxifen  . CML (chronic myeloid leukemia) ([facility])    Dr Alvy Bimler    Past Surgical History:  Procedure Laterality Date  . ABDOMINAL HYSTERECTOMY  ~1989   no BSO, for Dysplasia   . APPENDECTOMY  ~1989  . BILATERAL SALPINGOOPHORECTOMY  2010   with bowel resection  . BREAST SURGERY     Right lumpectomy, s/p radiation 1993 and oral  Tamoxifen x 5 years [id number])   . CATARACT EXTRACTION  06/08/2014   left eye;Dr Herbert Deaner  . COLONOSCOPY  03/2003, last 2010   diverticulosis, Dr.Kaplan  . EXCISION MORTON'S NEUROMA Left mid 80's   foot  . gastrocslide     left leg; Dr Beola Cord  . ileostomy reversal  2011   3 mos after above  . KNEE SURGERY  mid 80's   Arthroscopy, right knee   . LEG TENDON SURGERY Left 2013   "cut on leg and fixed something"  . OPEN SURGICAL REPAIR OF GLUTEAL TENDON Left 10/24/2016   Procedure: Left hip bursectomy; gluteal tendon repair;  Surgeon: Gaynelle Arabian, MD;  Location: [facility];  Service: Orthopedics;  Laterality: Left;  . sigmoid colon resection  2010    Dr Arlyce Dice, [place] for fistula & diverticulitis  . TOTAL KNEE ARTHROPLASTY Right 06/11/2013   Procedure: RIGHT TOTAL KNEE ARTHROPLASTY;  Surgeon: Sydnee Cabal, MD;  Location: [facility];  Service: Orthopedics;  Laterality: Right;  .  TRIGGER FINGER RELEASE Left 2013   thumb       Current Outpatient Medications  Medication Sig Dispense Refill Last Dose  . aspirin 81 MG tablet Take 81 mg by mouth daily.     . cholecalciferol (VITAMIN D3) 25 MCG (1000 UT) tablet Take 1,000 Units by mouth daily.     . Cyanocobalamin (B-12) 5000 MCG CAPS Take 1 tablet by mouth daily.     Marland Kitchen estradiol (ESTRACE) 0.1 MG/GM vaginal cream Insert 1 applicator full 2 times weekly. Need  office visit for more refills. 42.5 g 0   . FINACEA 15 % cream Apply 1 application topically daily.  2   . folic acid (FOLVITE) 1 MG tablet TAKE 1 TABLET(1 MG) BY MOUTH DAILY 30 tablet 3   . Glucosamine-Chondroit-Vit C-Mn (GLUCOSAMINE CHONDR 1500 COMPLX) CAPS Take 1 capsule by mouth daily.     Marland Kitchen ibuprofen (ADVIL,MOTRIN) 200 MG tablet Take 400 mg by mouth every 6 (six) hours as needed for moderate pain.      Marland Kitchen imatinib (GLEEVEC) 100 MG tablet TAKE 3 TABLETS(300 MG) BY MOUTH DAILY (Patient taking differently: Take 300 mg by mouth daily. ) 270 tablet 3   . Loperamide HCl (IMODIUM PO) Take 0.5-1 tablets by mouth 2 (two) times daily as needed (takes 0.5 tablet every morning and 2nd dose if needed for diarrhea).      . metoprolol tartrate (LOPRESSOR) 25 MG tablet Take 1 tablet (25 mg total) by mouth 2 (two) times daily. 180 tablet 3   . Multiple Vitamin (MULTIVITAMIN) tablet Take 1 tablet by mouth daily.     . Multiple Vitamins-Minerals (PRESERVISION AREDS 2) CAPS Take 1 capsule by mouth 2 (two) times a day.     . Probiotic Product (ALIGN) 4 MG CAPS Take 1 capsule by mouth every other day.       Allergies  Allergen Reactions  . Penicillins Itching and Rash    Hives Because of a history of documented adverse serious drug reaction;Medi Alert bracelet  is recommended Has patient had a PCN reaction causing immediate rash, facial/tongue/throat swelling, SOB or lightheadedness with hypotension: No Has patient had a PCN reaction causing severe rash involving mucus membranes or skin necrosis: Yes Has patient had a PCN reaction that required hospitalization: No Has patient had a PCN reaction occurring within the last 10 years: No If all of the above answers are    Social History   Tobacco Use  . Smoking status: Former Smoker    Years: 5.00    Types: Cigarettes    Quit date: 04/30/1962    Years since quitting: 56.8  . Smokeless tobacco: Never Used  . Tobacco comment: smoked 1956-1964, up to 1/2 ppd   Substance Use Topics  . Alcohol use: Yes    Alcohol/week: 7.0 - 14.0 standard drinks    Types: 7 - 14 Glasses of wine per week    Family History  Problem Relation Age of Onset  . Colon cancer Mother 41  . Uterine cancer Sister   . Diabetes Cousin   . Cancer Father        Lip cancer  . Aneurysm Father        AAA;S/P surgery  . Breast cancer Paternal Aunt   . Heart attack Paternal Uncle 36  . Stroke Neg Hx   . Esophageal cancer Neg Hx   . Rectal cancer Neg Hx   . Stomach cancer Neg Hx      Review of Systems  Constitutional:  Positive for malaise/fatigue. Negative for chills, diaphoresis, fever and weight loss.  HENT: Negative.   Eyes: Negative.   Respiratory: Negative.   Cardiovascular: Negative.   Gastrointestinal: Negative.   Genitourinary: Negative.   Musculoskeletal: Positive for joint pain and myalgias. Negative for back pain, falls and neck pain.  Skin: Negative.   Neurological: Negative.   Endo/Heme/Allergies: Negative.   Psychiatric/Behavioral: Negative.     Objective:  Physical Exam  Constitutional: She is oriented to person, place, and time. She appears well-developed. No distress.  HENT:  Head: Normocephalic and atraumatic.  Right Ear: External ear normal.  Left Ear: External ear normal.  Nose: Nose normal.  Mouth/Throat: Oropharynx is clear and moist.  Eyes: Conjunctivae and EOM are normal.  Neck: Normal range of motion. Neck supple.  Cardiovascular: Normal rate, regular rhythm, normal heart sounds and intact distal pulses.  No murmur heard. Respiratory: Effort normal and breath sounds normal. No respiratory distress. She has no wheezes.  GI: Soft. Bowel sounds are normal. She exhibits no distension. There is no abdominal tenderness.  Musculoskeletal:     Comments: Left Hip Exam: ROM: Flexion to 100 degrees. Minimal internal rotation. External rotation to 20-30 degrees. Abduction to 30 degrees.  Right Hip Exam: ROM: Normal without discomfort.   Neurological: She is alert and oriented to person, place, and time. She has normal strength. No sensory deficit.  Skin: No rash noted. She is not diaphoretic. No erythema.  Psychiatric: She has a normal mood and affect. Her behavior is normal.    Ht: 5 ft 1 in  Wt: 116.3 lbs  BMI: 22  BP: 132/78 sitting L arm  Pulse: 68 bpm   Imaging Review Plain radiographs demonstrate severe degenerative joint disease of the left hip(s). The bone quality appears to be good for age and reported activity level.   Assessment/Plan:  End stage primary osteoarthritis, left hip(s)  The patient history, physical examination, clinical judgement of the provider and imaging studies are consistent with end stage degenerative joint disease of the left hip(s) and total hip arthroplasty is deemed medically necessary. The treatment options including medical management, injection therapy, arthroscopy and arthroplasty were discussed at length. The risks and benefits of total hip arthroplasty were presented and reviewed. The risks due to aseptic loosening, infection, stiffness, dislocation/subluxation,  thromboembolic complications and other imponderables were discussed.  The patient acknowledged the explanation, agreed to proceed with the plan and consent was signed. Patient is being admitted for inpatient treatment for surgery, pain control, PT, OT, prophylactic antibiotics, VTE prophylaxis, progressive ambulation and ADL's and discharge planning.The patient is planning to be discharged home.   Risks and benefits of the surgery were discussed with the patient and Dr.Aluisio at their previous office visit, and the patient has elected to move forward with the aforementioned surgery. Post-operative care plans were discussed with the patient today.  Therapy Plans: HEP Disposition: Home with husband Planned DVT prophylaxis: Xarelto 10mg  daily DME needed: walker PCP: Dr. Quay Burow Cardio: Dr. Percival Spanish Oncology: Dr.  Alvy Bimler Other: no anesthesia concerns  Instructed patient on meds to stop prior to surgery  Ardeen Jourdain, PA-C

## 2019-02-25 NOTE — Patient Instructions (Addendum)
DUE TO COVID-19 ONLY ONE VISITOR IS ALLOWED TO COME WITH YOU AND STAY IN THE WAITING ROOM ONLY DURING PRE OP AND PROCEDURE DAY OF SURGERY. THE 1 VISITOR MAY VISIT WITH YOU AFTER SURGERY IN YOUR PRIVATE ROOM DURING VISITING HOURS ONLY!  YOU NEED TO HAVE A COVID 19 TEST ON___10-29____ @____9 :15AM___, THIS TEST MUST BE DONE BEFORE SURGERY, COME  [name] [name] , [id number].  ([place]) ONCE YOUR COVID TEST IS COMPLETED, PLEASE BEGIN THE QUARANTINE INSTRUCTIONS AS OUTLINED IN YOUR HANDOUT.                AABHA KLOSKY    Your procedure is scheduled on:  03-03-2019   Report to [facility] Main  Entrance   Report to admitting at 11:15AM     Call this number if you have problems the morning of surgery [phone]      Do not eat food After Midnight. YOU MAY HAVE CLEAR LIQUIDS FROM MIDNIGHT UNTIL 10:45AM. At 10:45AM Please finish the prescribed Pre-Surgery ENSURE drink. Nothing by mouth after you finish the ENSURE drink !   CLEAR LIQUID DIET   Foods Allowed                                                                     Foods Excluded  Coffee and tea, regular and decaf                             liquids that you cannot  Plain Jell-O any favor except red or purple                                           see through such as: Fruit ices (not with fruit pulp)                                     milk, soups, orange juice  Iced Popsicles                                    All solid food Carbonated beverages, regular and diet                                    Cranberry, grape and apple juices Sports drinks like Gatorade Lightly seasoned clear broth or consume(fat free) Sugar, honey syrup  Sample Menu Breakfast                                Lunch                                     Supper Cranberry juice  Beef broth                            Chicken broth Jell-O                                     Grape juice                            Apple juice Coffee or tea                        Jell-O                                      Popsicle                                                Coffee or tea                        Coffee or tea  _____________________________________________________________________  BRUSH YOUR TEETH MORNING OF SURGERY AND RINSE YOUR MOUTH OUT, NO CHEWING GUM CANDY OR MINTS.     Take these medicines the morning of surgery with A SIP OF WATER: metoprolol, Imatinib                                  You may not have any metal on your body including hair pins and              piercings  Do not wear jewelry, make-up, lotions, powders or perfumes, deodorant             Do not wear nail polish on your fingernails.  Do not shave  48 hours prior to surgery.             Do not bring valuables to the hospital. [place].  Contacts, dentures or bridgework may not be worn into surgery.  YOU MAY BRING A SMALL OVERNIGHT BAG              Please read over the following fact sheets you were given: _____________________________________________________________________             [facility] - Preparing for Surgery Before surgery, you can play an important role.  Because skin is not sterile, your skin needs to be as free of germs as possible.  You can reduce the number of germs on your skin by washing with CHG (chlorahexidine gluconate) soap before surgery.  CHG is an antiseptic cleaner which kills germs and bonds with the skin to continue killing germs even after washing. Please DO NOT use if you have an allergy to CHG or antibacterial soaps.  If your skin becomes reddened/irritated stop using the CHG and inform your nurse when you arrive at Short Stay. Do not shave (including legs and underarms) for at least 48 hours prior to the first CHG shower.  You  may shave your face/neck. Please follow these instructions carefully:  1.  Shower with CHG Soap the  night before surgery and the  morning of Surgery.  2.  If you choose to wash your hair, wash your hair first as usual with your  normal  shampoo.  3.  After you shampoo, rinse your hair and body thoroughly to remove the  shampoo.                           4.  Use CHG as you would any other liquid soap.  You can apply chg directly  to the skin and wash                       Gently with a scrungie or clean washcloth.  5.  Apply the CHG Soap to your body ONLY FROM THE NECK DOWN.   Do not use on face/ open                           Wound or open sores. Avoid contact with eyes, ears mouth and genitals (private parts).                       Wash face,  Genitals (private parts) with your normal soap.             6.  Wash thoroughly, paying special attention to the area where your surgery  will be performed.  7.  Thoroughly rinse your body with warm water from the neck down.  8.  DO NOT shower/wash with your normal soap after using and rinsing off  the CHG Soap.                9.  Pat yourself dry with a clean towel.            10.  Wear clean pajamas.            11.  Place clean sheets on your bed the night of your first shower and do not  sleep with pets. Day of Surgery : Do not apply any lotions/deodorants the morning of surgery.  Please wear clean clothes to the hospital/surgery center.  FAILURE TO FOLLOW THESE INSTRUCTIONS MAY RESULT IN THE CANCELLATION OF YOUR SURGERY PATIENT SIGNATURE_________________________________  NURSE SIGNATURE__________________________________  ________________________________________________________________________   Adam Phenix  An incentive spirometer is a tool that can help keep your lungs clear and active. This tool measures how well you are filling your lungs with each breath. Taking long deep breaths may help reverse or decrease the chance of developing breathing (pulmonary) problems (especially infection) following:  A long period of time when you  are unable to move or be active. BEFORE THE PROCEDURE   If the spirometer includes an indicator to show your best effort, your nurse or respiratory therapist will set it to a desired goal.  If possible, sit up straight or lean slightly forward. Try not to slouch.  Hold the incentive spirometer in an upright position. INSTRUCTIONS FOR USE  1. Sit on the edge of your bed if possible, or sit up as far as you can in bed or on a chair. 2. Hold the incentive spirometer in an upright position. 3. Breathe out normally. 4. Place the mouthpiece in your mouth and seal your lips tightly around it. 5. Breathe in slowly and as deeply as possible, raising  the piston or the ball toward the top of the column. 6. Hold your breath for 3-5 seconds or for as long as possible. Allow the piston or ball to fall to the bottom of the column. 7. Remove the mouthpiece from your mouth and breathe out normally. 8. Rest for a few seconds and repeat Steps 1 through 7 at least 10 times every 1-2 hours when you are awake. Take your time and take a few normal breaths between deep breaths. 9. The spirometer may include an indicator to show your best effort. Use the indicator as a goal to work toward during each repetition. 10. After each set of 10 deep breaths, practice coughing to be sure your lungs are clear. If you have an incision (the cut made at the time of surgery), support your incision when coughing by placing a pillow or rolled up towels firmly against it. Once you are able to get out of bed, walk around indoors and cough well. You may stop using the incentive spirometer when instructed by your caregiver.  RISKS AND COMPLICATIONS  Take your time so you do not get dizzy or light-headed.  If you are in pain, you may need to take or ask for pain medication before doing incentive spirometry. It is harder to take a deep breath if you are having pain. AFTER USE  Rest and breathe slowly and easily.  It can be helpful to  keep track of a log of your progress. Your caregiver can provide you with a simple table to help with this. If you are using the spirometer at home, follow these instructions: [place] IF:   You are having difficultly using the spirometer.  You have trouble using the spirometer as often as instructed.  Your pain medication is not giving enough relief while using the spirometer.  You develop fever of 100.5 F (38.1 C) or higher. SEEK IMMEDIATE MEDICAL CARE IF:   You cough up bloody sputum that had not been present before.  You develop fever of 102 F (38.9 C) or greater.  You develop worsening pain at or near the incision site. MAKE SURE YOU:   Understand these instructions.  Will watch your condition.  Will get help right away if you are not doing well or get worse. Document Released: 08/27/2006 Document Revised: 07/09/2011 Document Reviewed: 10/28/2006 ExitCare Patient Information 2014 Exit[employer], [place].   ________________________________________________________________________  WHAT IS A BLOOD TRANSFUSION? Blood Transfusion Information  A transfusion is the replacement of blood or some of its parts. Blood is made up of multiple cells which provide different functions.  Red blood cells carry oxygen and are used for blood loss replacement.  White blood cells fight against infection.  Platelets control bleeding.  Plasma helps clot blood.  Other blood products are available for specialized needs, such as hemophilia or other clotting disorders. BEFORE THE TRANSFUSION  Who gives blood for transfusions?   Healthy volunteers who are fully evaluated to make sure their blood is safe. This is blood bank blood. Transfusion therapy is the safest it has ever been in the practice of medicine. Before blood is taken from a donor, a complete history is taken to make sure that person has no history of diseases nor engages in risky social behavior (examples are intravenous drug  use or sexual activity with multiple partners). The donor's travel history is screened to minimize risk of transmitting infections, such as malaria. The donated blood is tested for signs of infectious diseases, such as HIV  and hepatitis. The blood is then tested to be sure it is compatible with you in order to minimize the chance of a transfusion reaction. If you or a relative donates blood, this is often done in anticipation of surgery and is not appropriate for emergency situations. It takes many days to process the donated blood. RISKS AND COMPLICATIONS Although transfusion therapy is very safe and saves many lives, the main dangers of transfusion include:   Getting an infectious disease.  Developing a transfusion reaction. This is an allergic reaction to something in the blood you were given. Every precaution is taken to prevent this. The decision to have a blood transfusion has been considered carefully by your caregiver before blood is given. Blood is not given unless the benefits outweigh the risks. AFTER THE TRANSFUSION  Right after receiving a blood transfusion, you will usually feel much better and more energetic. This is especially true if your red blood cells have gotten low (anemic). The transfusion raises the level of the red blood cells which carry oxygen, and this usually causes an energy increase.  The nurse administering the transfusion will monitor you carefully for complications. HOME CARE INSTRUCTIONS  No special instructions are needed after a transfusion. You may find your energy is better. Speak with your caregiver about any limitations on activity for underlying diseases you may have. SEEK MEDICAL CARE IF:   Your condition is not improving after your transfusion.  You develop redness or irritation at the intravenous (IV) site. SEEK IMMEDIATE MEDICAL CARE IF:  Any of the following symptoms occur over the next 12 hours:  Shaking chills.  You have a temperature by mouth  above 102 F (38.9 C), not controlled by medicine.  Chest, back, or muscle pain.  People around you feel you are not acting correctly or are confused.  Shortness of breath or difficulty breathing.  Dizziness and fainting.  You get a rash or develop hives.  You have a decrease in urine output.  Your urine turns a dark color or changes to pink, red, or brown. Any of the following symptoms occur over the next 10 days:  You have a temperature by mouth above 102 F (38.9 C), not controlled by medicine.  Shortness of breath.  Weakness after normal activity.  The white part of the eye turns yellow (jaundice).  You have a decrease in the amount of urine or are urinating less often.  Your urine turns a dark color or changes to pink, red, or brown. Document Released: 04/13/2000 Document Revised: 07/09/2011 Document Reviewed: 12/01/2007 [facility] Patient Information 2014 [place], [place].  _______________________________________________________________________

## 2019-02-26 ENCOUNTER — Encounter (HOSPITAL_COMMUNITY)
Admission: RE | Admit: 2019-02-26 | Discharge: 2019-02-26 | Disposition: A | Discharge status: Home / Self Care | Payer: Medicare Other | Source: Ambulatory Visit | Attending: Orthopedic Surgery | Admitting: Orthopedic Surgery

## 2019-02-26 ENCOUNTER — Encounter (HOSPITAL_COMMUNITY): Payer: Self-pay

## 2019-02-26 ENCOUNTER — Other Ambulatory Visit: Payer: Self-pay

## 2019-02-26 ENCOUNTER — Other Ambulatory Visit (HOSPITAL_COMMUNITY)
Admission: RE | Admit: 2019-02-26 | Discharge: 2019-02-26 | Disposition: A | Discharge status: Home / Self Care | Payer: Medicare Other | Source: Ambulatory Visit | Attending: Orthopedic Surgery | Admitting: Orthopedic Surgery

## 2019-02-26 DIAGNOSIS — Z01812 Encounter for preprocedural laboratory examination: Secondary | ICD-10-CM | POA: Diagnosis not present

## 2019-02-26 DIAGNOSIS — Z20828 Contact with and (suspected) exposure to other viral communicable diseases: Secondary | ICD-10-CM | POA: Insufficient documentation

## 2019-02-26 DIAGNOSIS — M1612 Unilateral primary osteoarthritis, left hip: Secondary | ICD-10-CM | POA: Diagnosis not present

## 2019-02-26 HISTORY — DX: Supraventricular tachycardia: I47.1

## 2019-02-26 HISTORY — DX: Supraventricular tachycardia, unspecified: I47.10

## 2019-02-26 LAB — SURGICAL PCR SCREEN
MRSA, PCR: NEGATIVE
Staphylococcus aureus: NEGATIVE

## 2019-02-26 LAB — COMPREHENSIVE METABOLIC PANEL
ALT: 20 U/L (ref 0–44)
AST: 32 U/L (ref 15–41)
Albumin: 4.1 g/dL (ref 3.5–5.0)
Alkaline Phosphatase: 52 U/L (ref 38–126)
Anion gap: 8 (ref 5–15)
BUN: 24 mg/dL — ABNORMAL HIGH (ref 8–23)
CO2: 27 mmol/L (ref 22–32)
Calcium: 9.1 mg/dL (ref 8.9–10.3)
Chloride: 103 mmol/L (ref 98–111)
Creatinine, Ser: 0.99 mg/dL (ref 0.44–1.00)
GFR calc Af Amer: >60 mL/min (ref >60)
GFR calc non Af Amer: 53 mL/min — ABNORMAL LOW (ref >60)
Glucose, Bld: 98 mg/dL (ref 70–99)
Potassium: 4.7 mmol/L (ref 3.5–5.1)
Sodium: 138 mmol/L (ref 135–145)
Total Bilirubin: 0.8 mg/dL (ref 0.3–1.2)
Total Protein: 6.5 g/dL (ref 6.5–8.1)

## 2019-02-26 LAB — PROTIME-INR
INR: 1 (ref 0.8–1.2)
Prothrombin Time: 13.5 seconds (ref 11.4–15.2)

## 2019-02-26 LAB — CBC
HCT: 29.9 % — ABNORMAL LOW (ref 36.0–46.0)
Hemoglobin: 9.9 g/dL — ABNORMAL LOW (ref 12.0–15.0)
MCH: 38.4 pg — ABNORMAL HIGH (ref 26.0–34.0)
MCHC: 33.1 g/dL (ref 30.0–36.0)
MCV: 115.9 fL — ABNORMAL HIGH (ref 80.0–100.0)
Platelets: 147 10*3/uL — ABNORMAL LOW (ref 150–400)
RBC: 2.58 MIL/uL — ABNORMAL LOW (ref 3.87–5.11)
RDW: 14 % (ref 11.5–15.5)
WBC: 3.9 10*3/uL — ABNORMAL LOW (ref 4.0–10.5)
nRBC: 0 % (ref 0.0–0.2)

## 2019-02-26 LAB — APTT: aPTT: 29 seconds (ref 24–36)

## 2019-02-26 NOTE — Progress Notes (Addendum)
PCP - Binnie Rail, MD Cardiologist -Dr Percival Spanish    Chest x-ray - CT chest epic 2020 EKG - epic 2020 Stress Test -  ECHO -  Cardiac Cath -   Sleep Study -  CPAP -   Fasting Blood Sugar -  Checks Blood Sugar _____ times a day  Blood Thinner Instructions: Aspirin Instructions: ASA hold x5 days per her surgeon  Hold Gleevec x  1week  Last Dose:  Anesthesia review:  Cardiac clearance epic Dr Percival Spanish  Hemoglobin 9.9 routed to surgeon Chart to PA for review  Patient denies shortness of breath, fever, cough and chest pain at PAT appointment   Patient verbalized understanding of instructions that were given to them at the PAT appointment. Patient was also instructed that they will need to review over the PAT instructions again at home before surgery.

## 2019-02-27 LAB — NOVEL CORONAVIRUS, NAA (HOSP ORDER, SEND-OUT TO REF LAB; TAT 18-24 HRS): SARS-CoV-2, NAA: NOT DETECTED

## 2019-02-27 NOTE — Progress Notes (Signed)
Anesthesia Chart Review   Case: [id number] Date/Time: 03/02/19 1335   Procedure: TOTAL HIP ARTHROPLASTY ANTERIOR APPROACH (Left Hip) - 174min   Anesthesia type: Choice   Pre-op diagnosis: left hip osteoarthritis   [facility]: [facility] ROOM 09 / [facility]   Surgeon: Gaynelle Arabian, MD      DISCUSSION:84 y.o. former smoker (quit 04/30/62) with h/o breast cancer 1993, SVT, CML (followed by Dr. Alvy Bimler, on [facility]), left hip OA scheduled for above procedure 03/02/2019 with Dr. Gaynelle Arabian.   Pt seen by PCP, Dr. Billey Gosling, 02/06/2019.  Per OV note, "Low risk for surgery. She has mild pancytopenia - chronic from treatment of CLL - stable - no need to recheck labs today given chronicity and stability. All medical problems stable and controlled. No history of coronary artery disease or chronic lung disease and no symptoms suggestive of either. No further evaluation or testing needed. Advised surgery but she is low risk."  Cleared by cardiology 01/26/2019.  Per Fabian Sharp, PA-C, "[name] [name] was last seen on 09/19/18 by Dr. Percival Spanish.  Since that day, [name] [name] has done well.  She does not have a history of MI or stroke. Her mobility is limited by hip pain, but she can still complete 4.0 METS without anginal symptoms. She may hold ASA 5-7 days prior to the procedure. Therefore, based on ACC/AHA guidelines, the patient would be at acceptable risk for the planned procedure without further cardiovascular testing."  Per oncology, "no contraindication for surgery from our specialty. Patient will need to hold Gleevac 1 week prior to her surgery date. Discussed with patient holding her medication on 10/25 and resuming the day she is discharged from the hospital."   VS: BP 136/68 (BP Location: Left Arm)   Pulse 74   Temp 37.2 C (Oral)   Resp 18   Ht 5\' 1"  (1.549 m)   Wt 54 kg   SpO2 99%   BMI 22.51 kg/m   PROVIDERS: Binnie Rail, MD is PCP   Minus Breeding, MD is Cardiologist  Heath Lark, MD  is Oncologist   LABS: Hemoglobin forwarded to surgeon (all labs ordered are listed, but only abnormal results are displayed)  Labs Reviewed  CBC - Abnormal; Notable for the following components:      Result Value   WBC 3.9 (*)    RBC 2.58 (*)    Hemoglobin 9.9 (*)    HCT 29.9 (*)    MCV 115.9 (*)    MCH 38.4 (*)    Platelets 147 (*)    All other components within normal limits  COMPREHENSIVE METABOLIC PANEL - Abnormal; Notable for the following components:   BUN 24 (*)    GFR calc non Af Amer 53 (*)    All other components within normal limits  SURGICAL PCR SCREEN  APTT  PROTIME-INR  TYPE AND SCREEN     IMAGES:   EKG: 09/15/2018 Rate 82 bpm  Sinus rhythm  Short PR interval  Abnormal R-wave progression, early transition   CV:  Past Medical History:  Diagnosis Date  . Anemia requiring transfusions 2010 & 2015  . Arthritis    DJD  . Breast cancer ([place]) 1993   S/P lumpectomy , radiation, & Tamoxifen  . CML (chronic myeloid leukemia) (HCC)    Dr Alvy Bimler;  . SVT (supraventricular tachycardia) [facility])    dx june 2020 , magd by Dr Percival Spanish ; reports occasional heart flutter    Past Surgical History:  Procedure Laterality Date  .  ABDOMINAL HYSTERECTOMY  ~1989   no BSO, for Dysplasia   . APPENDECTOMY  ~1989  . BILATERAL SALPINGOOPHORECTOMY  2010   with bowel resection  . BREAST SURGERY     Right lumpectomy, s/p radiation 1993 and oral  Tamoxifen x 5 years [id number])   . CATARACT EXTRACTION  06/08/2014   left eye;Dr Herbert Deaner  . CATARACT EXTRACTION, BILATERAL Bilateral 2015  . COLONOSCOPY  03/2003, last 2010   diverticulosis, Dr.Kaplan  . EXCISION MORTON'S NEUROMA Left mid 80's   foot  . gastrocslide     left leg; Dr Beola Cord  . ileostomy reversal  2011   3 mos after above  . KNEE SURGERY  mid 80's   Arthroscopy, right knee   . LEG TENDON SURGERY Left 2013   "cut on leg and fixed something"  . OPEN SURGICAL REPAIR OF GLUTEAL TENDON Left 10/24/2016    Procedure: Left hip bursectomy; gluteal tendon repair;  Surgeon: Gaynelle Arabian, MD;  Location[facility];  Service: Orthopedics;  Laterality: Left;  . sigmoid colon resection  2010    Dr Arlyce Dice, [place] for fistula & diverticulitis  . TENDON REPAIR Left 2019   left shoulder tendon repair - surgical center   . TOTAL KNEE ARTHROPLASTY Right 06/11/2013   Procedure: RIGHT TOTAL KNEE ARTHROPLASTY;  Surgeon: Sydnee Cabal, MD;  Location: [facility];  Service: Orthopedics;  Laterality: Right;  . TRIGGER FINGER RELEASE Left 2013   thumb    MEDICATIONS: . aspirin 81 MG tablet  . cholecalciferol (VITAMIN D3) 25 MCG (1000 UT) tablet  . Cyanocobalamin (B-12) 5000 MCG CAPS  . estradiol (ESTRACE) 0.1 MG/GM vaginal cream  . FINACEA 15 % cream  . folic acid (FOLVITE) 1 MG tablet  . Glucosamine-Chondroit-Vit C-Mn (GLUCOSAMINE CHONDR 1500 COMPLX) CAPS  . ibuprofen (ADVIL,MOTRIN) 200 MG tablet  . imatinib (GLEEVEC) 100 MG tablet  . Loperamide HCl (IMODIUM PO)  . metoprolol tartrate (LOPRESSOR) 25 MG tablet  . Multiple Vitamin (MULTIVITAMIN) tablet  . Multiple Vitamins-Minerals (PRESERVISION AREDS 2) CAPS  . Probiotic Product (ALIGN) 4 MG CAPS   No current facility-administered medications for this encounter.     Maia Plan [facility] Pre-Surgical Testing [phone] 02/27/19  12:46 PM

## 2019-02-27 NOTE — Anesthesia Preprocedure Evaluation (Addendum)
Anesthesia Evaluation  Patient identified by MRN, date of birth, ID band Patient awake    Reviewed: Allergy & Precautions, NPO status , Patient's Chart, lab work & pertinent test results, reviewed documented beta blocker date and time   Airway Mallampati: II  TM Distance: >3 FB Neck ROM: Full    Dental no notable dental hx. (+) Teeth Intact, Dental Advisory Given   Pulmonary former smoker,  Quit smoking 1964   Pulmonary exam normal breath sounds clear to auscultation       Cardiovascular hypertension, Pt. on medications and Pt. on home beta blockers Normal cardiovascular exam+ dysrhythmias Supra Ventricular Tachycardia  Rhythm:Regular Rate:Normal  Hx SVT 09/2018   Neuro/Psych negative psych ROS   GI/Hepatic negative GI ROS, (+)     substance abuse  alcohol use,   Endo/Other  negative endocrine ROS  Renal/GU negative Renal ROS   Hx breast ca s/p lumpectomy, radiation, tamoxifen 1993    Musculoskeletal  (+) Arthritis , Osteoarthritis,    Abdominal Normal abdominal exam  (+)   Peds  Hematology  (+) anemia , CML on gleevac- held for one week preop per hematologist   Anesthesia Other Findings   Reproductive/Obstetrics negative OB ROS                                                            Anesthesia Evaluation  Patient identified by MRN, date of birth, ID band Patient awake    Reviewed: Allergy & Precautions, NPO status , Patient's Chart, lab work & pertinent test results  History of Anesthesia Complications Negative for: history of anesthetic complications  Airway Mallampati: II  TM Distance: >3 FB Neck ROM: Full    Dental  (+) Dental Advisory Given, Chipped   Pulmonary former smoker,    breath sounds clear to auscultation       Cardiovascular negative cardio ROS   Rhythm:Regular Rate:Normal     Neuro/Psych negative neurological ROS      GI/Hepatic negative GI ROS, Neg liver ROS,   Endo/Other  negative endocrine ROS  Renal/GU negative Renal ROS     Musculoskeletal  (+) Arthritis ,   Abdominal   Peds  Hematology  (+) Blood dyscrasia (CML, Hb 10.3), anemia ,   Anesthesia Other Findings Breast cancer: surgery, XRT, tamoxifen  Reproductive/Obstetrics                            Anesthesia Physical Anesthesia Plan  ASA: III  Anesthesia Plan: Spinal   Post-op Pain Management:    Induction:   PONV Risk Score and Plan: 2 and Ondansetron and Dexamethasone  Airway Management Planned: Natural Airway and Simple Face Mask  Additional Equipment:   Intra-op Plan:   Post-operative Plan:   Informed Consent: I have reviewed the patients History and Physical, chart, labs and discussed the procedure including the risks, benefits and alternatives for the proposed anesthesia with the patient or authorized representative who has indicated his/her understanding and acceptance.   Dental advisory given  Plan Discussed with: CRNA and Surgeon  Anesthesia Plan Comments: (Plan routine monitors, SAB)        Anesthesia Quick Evaluation  Anesthesia Physical Anesthesia Plan  ASA: III  Anesthesia Plan: Spinal   Post-op Pain Management:  Induction:   PONV Risk Score and Plan: 2 and Propofol infusion, TIVA and Treatment may vary due to age or medical condition  Airway Management Planned: Natural Airway and Nasal Cannula  Additional Equipment: None  Intra-op Plan:   Post-operative Plan:   Informed Consent: I have reviewed the patients History and Physical, chart, labs and discussed the procedure including the risks, benefits and alternatives for the proposed anesthesia with the patient or authorized representative who has indicated his/her understanding and acceptance.       Plan Discussed with: CRNA  Anesthesia Plan Comments:        Anesthesia Quick Evaluation

## 2019-03-02 ENCOUNTER — Encounter (HOSPITAL_COMMUNITY): Payer: Self-pay | Admitting: *Deleted

## 2019-03-02 ENCOUNTER — Inpatient Hospital Stay (HOSPITAL_COMMUNITY)
Admission: RE | Admit: 2019-03-02 | Discharge: 2019-03-03 | DRG: 470 | Disposition: A | Discharge status: Home / Self Care | Payer: Medicare Other | Attending: Orthopedic Surgery | Admitting: Orthopedic Surgery

## 2019-03-02 ENCOUNTER — Other Ambulatory Visit: Payer: Self-pay

## 2019-03-02 ENCOUNTER — Inpatient Hospital Stay (HOSPITAL_COMMUNITY): Payer: Medicare Other | Admitting: Physician Assistant

## 2019-03-02 ENCOUNTER — Inpatient Hospital Stay (HOSPITAL_COMMUNITY): Payer: Medicare Other

## 2019-03-02 ENCOUNTER — Encounter (HOSPITAL_COMMUNITY)
Admission: RE | Disposition: A | Discharge status: Home / Self Care | Payer: Self-pay | Source: Home / Self Care | Attending: Orthopedic Surgery

## 2019-03-02 ENCOUNTER — Inpatient Hospital Stay (HOSPITAL_COMMUNITY): Payer: Medicare Other | Admitting: Anesthesiology

## 2019-03-02 DIAGNOSIS — Z79899 Other long term (current) drug therapy: Secondary | ICD-10-CM

## 2019-03-02 DIAGNOSIS — M1612 Unilateral primary osteoarthritis, left hip: Secondary | ICD-10-CM | POA: Diagnosis not present

## 2019-03-02 DIAGNOSIS — Z7982 Long term (current) use of aspirin: Secondary | ICD-10-CM | POA: Diagnosis not present

## 2019-03-02 DIAGNOSIS — D509 Iron deficiency anemia, unspecified: Secondary | ICD-10-CM | POA: Diagnosis present

## 2019-03-02 DIAGNOSIS — Z96651 Presence of right artificial knee joint: Secondary | ICD-10-CM | POA: Diagnosis not present

## 2019-03-02 DIAGNOSIS — C9210 Chronic myeloid leukemia, BCR/ABL-positive, not having achieved remission: Secondary | ICD-10-CM | POA: Diagnosis not present

## 2019-03-02 DIAGNOSIS — Z800 Family history of malignant neoplasm of digestive organs: Secondary | ICD-10-CM | POA: Diagnosis not present

## 2019-03-02 DIAGNOSIS — E785 Hyperlipidemia, unspecified: Secondary | ICD-10-CM | POA: Diagnosis not present

## 2019-03-02 DIAGNOSIS — R32 Unspecified urinary incontinence: Secondary | ICD-10-CM | POA: Diagnosis present

## 2019-03-02 DIAGNOSIS — Z803 Family history of malignant neoplasm of breast: Secondary | ICD-10-CM

## 2019-03-02 DIAGNOSIS — M25752 Osteophyte, left hip: Secondary | ICD-10-CM | POA: Diagnosis present

## 2019-03-02 DIAGNOSIS — Z96649 Presence of unspecified artificial hip joint: Secondary | ICD-10-CM

## 2019-03-02 DIAGNOSIS — M8580 Other specified disorders of bone density and structure, unspecified site: Secondary | ICD-10-CM | POA: Diagnosis not present

## 2019-03-02 DIAGNOSIS — Z87891 Personal history of nicotine dependence: Secondary | ICD-10-CM

## 2019-03-02 DIAGNOSIS — Z833 Family history of diabetes mellitus: Secondary | ICD-10-CM | POA: Diagnosis not present

## 2019-03-02 DIAGNOSIS — M169 Osteoarthritis of hip, unspecified: Secondary | ICD-10-CM | POA: Diagnosis present

## 2019-03-02 DIAGNOSIS — I471 Supraventricular tachycardia: Secondary | ICD-10-CM | POA: Diagnosis present

## 2019-03-02 DIAGNOSIS — Z880 Allergy status to penicillin: Secondary | ICD-10-CM | POA: Diagnosis not present

## 2019-03-02 DIAGNOSIS — M899 Disorder of bone, unspecified: Secondary | ICD-10-CM | POA: Diagnosis present

## 2019-03-02 DIAGNOSIS — Z20828 Contact with and (suspected) exposure to other viral communicable diseases: Secondary | ICD-10-CM | POA: Diagnosis not present

## 2019-03-02 DIAGNOSIS — Z8249 Family history of ischemic heart disease and other diseases of the circulatory system: Secondary | ICD-10-CM

## 2019-03-02 DIAGNOSIS — Z8049 Family history of malignant neoplasm of other genital organs: Secondary | ICD-10-CM | POA: Diagnosis not present

## 2019-03-02 DIAGNOSIS — Z808 Family history of malignant neoplasm of other organs or systems: Secondary | ICD-10-CM | POA: Diagnosis not present

## 2019-03-02 DIAGNOSIS — Z853 Personal history of malignant neoplasm of breast: Secondary | ICD-10-CM | POA: Diagnosis not present

## 2019-03-02 DIAGNOSIS — E559 Vitamin D deficiency, unspecified: Secondary | ICD-10-CM | POA: Diagnosis not present

## 2019-03-02 DIAGNOSIS — Z419 Encounter for procedure for purposes other than remedying health state, unspecified: Secondary | ICD-10-CM

## 2019-03-02 HISTORY — PX: TOTAL HIP ARTHROPLASTY: SHX124

## 2019-03-02 SURGERY — ARTHROPLASTY, HIP, TOTAL, ANTERIOR APPROACH
Anesthesia: Spinal | Site: Hip | Laterality: Left

## 2019-03-02 MED ORDER — IMATINIB MESYLATE 100 MG PO TABS: 300.0000 mg | ORAL_TABLET | Freq: Every day | ORAL | Status: DC

## 2019-03-02 MED ORDER — METHOCARBAMOL 500 MG IVPB - SIMPLE MED
500.0000 mg | Freq: Four times a day (QID) | INTRAVENOUS | Status: DC | PRN
  Administered 2019-03-02: 16:00:00 500 mg via INTRAVENOUS
  Filled 2019-03-02: qty 50

## 2019-03-02 MED ORDER — FENTANYL CITRATE (PF) 100 MCG/2ML IJ SOLN
INTRAMUSCULAR | Status: DC | PRN
  Administered 2019-03-02: 100 ug via INTRAVENOUS

## 2019-03-02 MED ORDER — DEXAMETHASONE SODIUM PHOSPHATE 10 MG/ML IJ SOLN
10.0000 mg | Freq: Once | INTRAMUSCULAR | Status: AC
  Administered 2019-03-03: 10:00:00 10 mg via INTRAVENOUS
  Filled 2019-03-02: qty 1

## 2019-03-02 MED ORDER — ACETAMINOPHEN 10 MG/ML IV SOLN
1000.0000 mg | Freq: Four times a day (QID) | INTRAVENOUS | Status: DC
  Administered 2019-03-02: 1000 mg via INTRAVENOUS
  Filled 2019-03-02: qty 100

## 2019-03-02 MED ORDER — METOPROLOL TARTRATE 25 MG PO TABS
25.0000 mg | ORAL_TABLET | Freq: Two times a day (BID) | ORAL | Status: DC
  Administered 2019-03-02: 23:00:00 25 mg via ORAL
  Filled 2019-03-02 (×2): qty 1

## 2019-03-02 MED ORDER — PHENYLEPHRINE 40 MCG/ML (10ML) SYRINGE FOR IV PUSH (FOR BLOOD PRESSURE SUPPORT)
PREFILLED_SYRINGE | INTRAVENOUS | Status: AC
  Filled 2019-03-02: qty 10

## 2019-03-02 MED ORDER — WATER FOR IRRIGATION, STERILE IR SOLN
Status: DC | PRN
  Administered 2019-03-02: 2000 mL

## 2019-03-02 MED ORDER — DEXAMETHASONE SODIUM PHOSPHATE 10 MG/ML IJ SOLN: 8.0000 mg | Freq: Once | INTRAMUSCULAR | Status: DC

## 2019-03-02 MED ORDER — SODIUM CHLORIDE 0.9 % IV SOLN
INTRAVENOUS | Status: DC
  Administered 2019-03-02: 17:00:00 via INTRAVENOUS

## 2019-03-02 MED ORDER — PROPOFOL 10 MG/ML IV BOLUS
INTRAVENOUS | Status: DC | PRN
  Administered 2019-03-02: 20 mg via INTRAVENOUS

## 2019-03-02 MED ORDER — DIPHENHYDRAMINE HCL 12.5 MG/5ML PO ELIX: 12.5000 mg | ORAL_SOLUTION | ORAL | Status: DC | PRN

## 2019-03-02 MED ORDER — ONDANSETRON HCL 4 MG/2ML IJ SOLN
INTRAMUSCULAR | Status: AC
  Filled 2019-03-02: qty 2

## 2019-03-02 MED ORDER — BISACODYL 10 MG RE SUPP: 10.0000 mg | Freq: Every day | RECTAL | Status: DC | PRN

## 2019-03-02 MED ORDER — BUPIVACAINE HCL (PF) 0.25 % IJ SOLN
INTRAMUSCULAR | Status: AC
  Filled 2019-03-02: qty 30

## 2019-03-02 MED ORDER — ASPIRIN EC 325 MG PO TBEC
325.0000 mg | DELAYED_RELEASE_TABLET | Freq: Two times a day (BID) | ORAL | Status: DC
  Administered 2019-03-03: 09:00:00 325 mg via ORAL
  Filled 2019-03-02: qty 1

## 2019-03-02 MED ORDER — FENTANYL CITRATE (PF) 100 MCG/2ML IJ SOLN: 25.0000 ug | INTRAMUSCULAR | Status: DC | PRN

## 2019-03-02 MED ORDER — BUPIVACAINE IN DEXTROSE 0.75-8.25 % IT SOLN
INTRATHECAL | Status: DC | PRN
  Administered 2019-03-02: 1.6 mL via INTRATHECAL

## 2019-03-02 MED ORDER — PHENYLEPHRINE HCL (PRESSORS) 10 MG/ML IV SOLN
INTRAVENOUS | Status: AC
  Filled 2019-03-02: qty 1

## 2019-03-02 MED ORDER — ACETAMINOPHEN 500 MG PO TABS
500.0000 mg | ORAL_TABLET | Freq: Four times a day (QID) | ORAL | Status: DC
  Administered 2019-03-02 – 2019-03-03 (×2): 500 mg via ORAL
  Filled 2019-03-02 (×3): qty 1

## 2019-03-02 MED ORDER — PROPOFOL 500 MG/50ML IV EMUL
INTRAVENOUS | Status: DC | PRN
  Administered 2019-03-02: 50 ug/kg/min via INTRAVENOUS

## 2019-03-02 MED ORDER — METOCLOPRAMIDE HCL 5 MG PO TABS: 5.0000 mg | ORAL_TABLET | Freq: Three times a day (TID) | ORAL | Status: DC | PRN

## 2019-03-02 MED ORDER — CEFAZOLIN SODIUM-DEXTROSE 1-4 GM/50ML-% IV SOLN
1.0000 g | Freq: Four times a day (QID) | INTRAVENOUS | Status: AC
  Administered 2019-03-02 – 2019-03-03 (×2): 1 g via INTRAVENOUS
  Filled 2019-03-02 (×2): qty 50

## 2019-03-02 MED ORDER — METHOCARBAMOL 500 MG IVPB - SIMPLE MED
INTRAVENOUS | Status: AC
  Filled 2019-03-02: qty 50

## 2019-03-02 MED ORDER — ASPIRIN EC 325 MG PO TBEC: 325.0000 mg | DELAYED_RELEASE_TABLET | Freq: Two times a day (BID) | ORAL | Status: DC

## 2019-03-02 MED ORDER — PROPOFOL 500 MG/50ML IV EMUL
INTRAVENOUS | Status: AC
  Filled 2019-03-02: qty 50

## 2019-03-02 MED ORDER — KETOROLAC TROMETHAMINE 15 MG/ML IJ SOLN
15.0000 mg | Freq: Once | INTRAMUSCULAR | Status: AC | PRN
  Administered 2019-03-02: 15 mg via INTRAVENOUS

## 2019-03-02 MED ORDER — DOCUSATE SODIUM 100 MG PO CAPS
100.0000 mg | ORAL_CAPSULE | Freq: Two times a day (BID) | ORAL | Status: DC
  Administered 2019-03-02 – 2019-03-03 (×2): 100 mg via ORAL
  Filled 2019-03-02 (×2): qty 1

## 2019-03-02 MED ORDER — RIVAROXABAN 10 MG PO TABS: 10.0000 mg | ORAL_TABLET | Freq: Every day | ORAL | Status: DC

## 2019-03-02 MED ORDER — TRAMADOL HCL 50 MG PO TABS: 50.0000 mg | ORAL_TABLET | Freq: Four times a day (QID) | ORAL | Status: DC | PRN

## 2019-03-02 MED ORDER — EPHEDRINE 5 MG/ML INJ
INTRAVENOUS | Status: AC
  Filled 2019-03-02: qty 20

## 2019-03-02 MED ORDER — LIDOCAINE HCL (CARDIAC) PF 100 MG/5ML IV SOSY
PREFILLED_SYRINGE | INTRAVENOUS | Status: DC | PRN
  Administered 2019-03-02: 60 mg via INTRAVENOUS

## 2019-03-02 MED ORDER — 0.9 % SODIUM CHLORIDE (POUR BTL) OPTIME
TOPICAL | Status: DC | PRN
  Administered 2019-03-02: 1000 mL

## 2019-03-02 MED ORDER — POLYETHYLENE GLYCOL 3350 17 G PO PACK: 17.0000 g | PACK | Freq: Every day | ORAL | Status: DC | PRN

## 2019-03-02 MED ORDER — MORPHINE SULFATE (PF) 2 MG/ML IV SOLN: 0.5000 mg | INTRAVENOUS | Status: DC | PRN

## 2019-03-02 MED ORDER — TRANEXAMIC ACID-NACL 1000-0.7 MG/100ML-% IV SOLN
1000.0000 mg | INTRAVENOUS | Status: AC
  Administered 2019-03-02: 14:00:00 1000 mg via INTRAVENOUS
  Filled 2019-03-02: qty 100

## 2019-03-02 MED ORDER — BUPIVACAINE HCL 0.25 % IJ SOLN
INTRAMUSCULAR | Status: DC | PRN
  Administered 2019-03-02: 30 mL via INTRA_ARTICULAR

## 2019-03-02 MED ORDER — TRANEXAMIC ACID-NACL 1000-0.7 MG/100ML-% IV SOLN
1000.0000 mg | Freq: Once | INTRAVENOUS | Status: AC
  Administered 2019-03-02: 1000 mg via INTRAVENOUS
  Filled 2019-03-02: qty 100

## 2019-03-02 MED ORDER — METHOCARBAMOL 500 MG PO TABS
500.0000 mg | ORAL_TABLET | Freq: Four times a day (QID) | ORAL | Status: DC | PRN
  Administered 2019-03-03: 09:00:00 500 mg via ORAL
  Filled 2019-03-02: qty 1

## 2019-03-02 MED ORDER — LACTATED RINGERS IV SOLN
INTRAVENOUS | Status: DC
  Administered 2019-03-02 (×2): via INTRAVENOUS

## 2019-03-02 MED ORDER — DEXAMETHASONE SODIUM PHOSPHATE 10 MG/ML IJ SOLN
INTRAMUSCULAR | Status: DC | PRN
  Administered 2019-03-02: 4 mg via INTRAVENOUS

## 2019-03-02 MED ORDER — ONDANSETRON HCL 4 MG/2ML IJ SOLN: 4.0000 mg | Freq: Once | INTRAMUSCULAR | Status: DC | PRN

## 2019-03-02 MED ORDER — FLEET ENEMA 7-19 GM/118ML RE ENEM: 1.0000 | ENEMA | Freq: Once | RECTAL | Status: DC | PRN

## 2019-03-02 MED ORDER — DEXAMETHASONE SODIUM PHOSPHATE 10 MG/ML IJ SOLN
INTRAMUSCULAR | Status: AC
  Filled 2019-03-02: qty 1

## 2019-03-02 MED ORDER — ONDANSETRON HCL 4 MG PO TABS: 4.0000 mg | ORAL_TABLET | Freq: Four times a day (QID) | ORAL | Status: DC | PRN

## 2019-03-02 MED ORDER — FENTANYL CITRATE (PF) 100 MCG/2ML IJ SOLN
INTRAMUSCULAR | Status: AC
  Filled 2019-03-02: qty 2

## 2019-03-02 MED ORDER — PHENOL 1.4 % MT LIQD: 1.0000 | OROMUCOSAL | Status: DC | PRN

## 2019-03-02 MED ORDER — LIDOCAINE 2% (20 MG/ML) 5 ML SYRINGE
INTRAMUSCULAR | Status: AC
  Filled 2019-03-02: qty 5

## 2019-03-02 MED ORDER — CHLORHEXIDINE GLUCONATE 4 % EX LIQD: 60.0000 mL | Freq: Once | CUTANEOUS | Status: DC

## 2019-03-02 MED ORDER — POVIDONE-IODINE 10 % EX SWAB
2.0000 "application " | Freq: Once | CUTANEOUS | Status: AC
  Administered 2019-03-02: 2 via TOPICAL

## 2019-03-02 MED ORDER — HYDROCODONE-ACETAMINOPHEN 5-325 MG PO TABS
1.0000 | ORAL_TABLET | ORAL | Status: DC | PRN
  Administered 2019-03-03: 2 via ORAL
  Administered 2019-03-03: 06:00:00 1 via ORAL
  Filled 2019-03-02: qty 1
  Filled 2019-03-02: qty 2

## 2019-03-02 MED ORDER — PHENYLEPHRINE HCL-NACL 10-0.9 MG/250ML-% IV SOLN
INTRAVENOUS | Status: DC | PRN
  Administered 2019-03-02: 50 ug/min via INTRAVENOUS

## 2019-03-02 MED ORDER — METOCLOPRAMIDE HCL 5 MG/ML IJ SOLN: 5.0000 mg | Freq: Three times a day (TID) | INTRAMUSCULAR | Status: DC | PRN

## 2019-03-02 MED ORDER — ONDANSETRON HCL 4 MG/2ML IJ SOLN
INTRAMUSCULAR | Status: DC | PRN
  Administered 2019-03-02: 4 mg via INTRAVENOUS

## 2019-03-02 MED ORDER — CEFAZOLIN SODIUM-DEXTROSE 2-4 GM/100ML-% IV SOLN
2.0000 g | INTRAVENOUS | Status: AC
  Administered 2019-03-02: 2 g via INTRAVENOUS
  Filled 2019-03-02: qty 100

## 2019-03-02 MED ORDER — PHENYLEPHRINE 40 MCG/ML (10ML) SYRINGE FOR IV PUSH (FOR BLOOD PRESSURE SUPPORT)
PREFILLED_SYRINGE | INTRAVENOUS | Status: AC
  Filled 2019-03-02: qty 20

## 2019-03-02 MED ORDER — KETOROLAC TROMETHAMINE 15 MG/ML IJ SOLN
INTRAMUSCULAR | Status: AC
  Filled 2019-03-02: qty 1

## 2019-03-02 MED ORDER — MENTHOL 3 MG MT LOZG: 1.0000 | LOZENGE | OROMUCOSAL | Status: DC | PRN

## 2019-03-02 MED ORDER — ONDANSETRON HCL 4 MG/2ML IJ SOLN: 4.0000 mg | Freq: Four times a day (QID) | INTRAMUSCULAR | Status: DC | PRN

## 2019-03-02 SURGICAL SUPPLY — 47 items
BAG DRN RND TRDRP ANRFLXCHMBR (UROLOGICAL SUPPLIES) ×1
BAG URINE DRAIN 2000ML AR STRL (UROLOGICAL SUPPLIES) ×2 IMPLANT
BLADE SAG 18X100X1.27 (BLADE) ×3 IMPLANT
CLOSURE WOUND 1/2 X4 (GAUZE/BANDAGES/DRESSINGS) ×1
COVER PERINEAL POST (MISCELLANEOUS) ×3 IMPLANT
COVER SURGICAL LIGHT HANDLE (MISCELLANEOUS) ×3 IMPLANT
COVER WAND RF STERILE (DRAPES) IMPLANT
CUP ACETBLR 48 OD SECTOR II (Hips) ×2 IMPLANT
DECANTER SPIKE VIAL GLASS SM (MISCELLANEOUS) ×3 IMPLANT
DRAPE STERI IOBAN 125X83 (DRAPES) ×3 IMPLANT
DRAPE U-SHAPE 47X51 STRL (DRAPES) ×6 IMPLANT
DRSG ADAPTIC 3X8 NADH LF (GAUZE/BANDAGES/DRESSINGS) ×3 IMPLANT
DRSG MEPILEX BORDER 4X4 (GAUZE/BANDAGES/DRESSINGS) ×3 IMPLANT
DRSG MEPILEX BORDER 4X8 (GAUZE/BANDAGES/DRESSINGS) ×3 IMPLANT
DURAPREP 26ML APPLICATOR (WOUND CARE) ×3 IMPLANT
ELECT REM PT RETURN 15FT ADLT (MISCELLANEOUS) ×3 IMPLANT
EVACUATOR 1/8 PVC DRAIN (DRAIN) ×3 IMPLANT
GLOVE BIO SURGEON STRL SZ 6 (GLOVE) ×2 IMPLANT
GLOVE BIO SURGEON STRL SZ7 (GLOVE) ×2 IMPLANT
GLOVE BIO SURGEON STRL SZ8 (GLOVE) ×3 IMPLANT
GLOVE BIOGEL PI IND STRL 6.5 (GLOVE) IMPLANT
GLOVE BIOGEL PI IND STRL 7.0 (GLOVE) IMPLANT
GLOVE BIOGEL PI IND STRL 8 (GLOVE) ×1 IMPLANT
GLOVE BIOGEL PI INDICATOR 6.5 (GLOVE) ×2
GLOVE BIOGEL PI INDICATOR 7.0 (GLOVE) ×2
GLOVE BIOGEL PI INDICATOR 8 (GLOVE) ×2
GOWN STRL REUS W/TWL LRG LVL3 (GOWN DISPOSABLE) ×3 IMPLANT
GOWN STRL REUS W/TWL XL LVL3 (GOWN DISPOSABLE) IMPLANT
HEAD FEM STD 28X+1.5 STRL (Hips) ×2 IMPLANT
HOLDER FOLEY CATH W/STRAP (MISCELLANEOUS) ×3 IMPLANT
KIT TURNOVER KIT A (KITS) IMPLANT
LINER MARATHON 28 48 (Hips) ×2 IMPLANT
MANIFOLD NEPTUNE II (INSTRUMENTS) ×3 IMPLANT
NDL SAFETY ECLIPSE 18X1.5 (NEEDLE) IMPLANT
NEEDLE HYPO 18GX1.5 SHARP (NEEDLE) ×3
PACK ANTERIOR HIP CUSTOM (KITS) ×3 IMPLANT
STEM FEM ACTIS STD SZ4 (Stem) ×2 IMPLANT
STRIP CLOSURE SKIN 1/2X4 (GAUZE/BANDAGES/DRESSINGS) ×2 IMPLANT
SUT ETHIBOND NAB CT1 #1 30IN (SUTURE) ×3 IMPLANT
SUT MNCRL AB 4-0 PS2 18 (SUTURE) ×3 IMPLANT
SUT STRATAFIX 0 PDS 27 VIOLET (SUTURE) ×3
SUT VIC AB 2-0 CT1 27 (SUTURE) ×6
SUT VIC AB 2-0 CT1 TAPERPNT 27 (SUTURE) ×2 IMPLANT
SUTURE STRATFX 0 PDS 27 VIOLET (SUTURE) ×1 IMPLANT
SYR 50ML LL SCALE MARK (SYRINGE) ×2 IMPLANT
TRAY FOLEY MTR SLVR 16FR STAT (SET/KITS/TRAYS/PACK) ×3 IMPLANT
YANKAUER SUCT BULB TIP 10FT TU (MISCELLANEOUS) ×3 IMPLANT

## 2019-03-02 NOTE — Transfer of Care (Signed)
Immediate Anesthesia Transfer of Care Note  Patient: [name] [name]  Procedure(s) Performed: TOTAL HIP ARTHROPLASTY ANTERIOR APPROACH (Left Hip)  Patient Location: PACU  Anesthesia Type:Spinal  Level of Consciousness: awake, alert , oriented and patient cooperative  Airway & Oxygen Therapy: Patient Spontanous Breathing and Patient connected to face mask oxygen  Post-op Assessment: Report given to RN, Post -op Vital signs reviewed and stable and Patient moving all extremities  Post vital signs: Reviewed and stable  Last Vitals:  Vitals Value Taken Time  BP 112/67 03/02/19 1527  Temp    Pulse 53 03/02/19 1530  Resp 19 03/02/19 1530  SpO2 100 % 03/02/19 1530  Vitals shown include unvalidated device data.  Last Pain:  Vitals:   03/02/19 1145  TempSrc: Oral      Patients Stated Pain Goal: 4 ([id number])  Complications: No apparent anesthesia complications

## 2019-03-02 NOTE — Op Note (Signed)
OPERATIVE REPORT- TOTAL HIP ARTHROPLASTY   PREOPERATIVE DIAGNOSIS: Osteoarthritis of the Left hip.   POSTOPERATIVE DIAGNOSIS: Osteoarthritis of the Left  hip.   PROCEDURE: Left total hip arthroplasty, anterior approach.   SURGEON: Gaynelle Arabian, MD   ASSISTANT: Ardeen Jourdain, PA-C  ANESTHESIA:  Spinal  ESTIMATED BLOOD LOSS:-200 mL    DRAINS: Hemovac x1.   COMPLICATIONS: None   CONDITION: PACU - hemodynamically stable.   BRIEF CLINICAL NOTE: [name] [name] is a 84 y.o. female who has advanced end-  stage arthritis of their Left  hip with progressively worsening pain and  dysfunction.The patient has failed nonoperative management and presents for  total hip arthroplasty.   PROCEDURE IN DETAIL: After successful administration of spinal  anesthetic, the traction boots for the [facility] bed were placed on both  feet and the patient was placed onto the [facility] bed, boots placed into the leg  holders. The Left hip was then isolated from the perineum with plastic  drapes and prepped and draped in the usual sterile fashion. ASIS and  greater trochanter were marked and a oblique incision was made, starting  at about 1 cm lateral and 2 cm distal to the ASIS and coursing towards  the anterior cortex of the femur. The skin was cut with a 10 blade  through subcutaneous tissue to the level of the fascia overlying the  tensor fascia lata muscle. The fascia was then incised in line with the  incision at the junction of the anterior third and posterior 2/3rd. The  muscle was teased off the fascia and then the interval between the TFL  and the rectus was developed. The Hohmann retractor was then placed at  the top of the femoral neck over the capsule. The vessels overlying the  capsule were cauterized and the fat on top of the capsule was removed.  A Hohmann retractor was then placed anterior underneath the rectus  femoris to give exposure to the entire anterior capsule. A T-shaped   capsulotomy was performed. The edges were tagged and the femoral head  was identified.       Osteophytes are removed off the superior acetabulum.  The femoral neck was then cut in situ with an oscillating saw. Traction  was then applied to the left lower extremity utilizing the [facility]  traction. The femoral head was then removed. Retractors were placed  around the acetabulum and then circumferential removal of the labrum was  performed. Osteophytes were also removed. Reaming starts at 45 mm to  medialize and  Increased in 2 mm increments to 47 mm. We reamed in  approximately 40 degrees of abduction, 20 degrees anteversion. A 48 mm  pinnacle acetabular shell was then impacted in anatomic position under  fluoroscopic guidance with excellent purchase. We did not need to place  any additional dome screws. A 28 mm neutral + 4 marathon liner was then  placed into the acetabular shell.       The femoral lift was then placed along the lateral aspect of the femur  just distal to the vastus ridge. The leg was  externally rotated and capsule  was stripped off the inferior aspect of the femoral neck down to the  level of the lesser trochanter, this was done with electrocautery. The femur was lifted after this was performed. The  leg was then placed in an extended and adducted position essentially delivering the femur. We also removed the capsule superiorly and the piriformis from the piriformis  fossa to gain excellent exposure of the  proximal femur. Rongeur was used to remove some cancellous bone to get  into the lateral portion of the proximal femur for placement of the  initial starter reamer. The starter broaches was placed  the starter broach  and was shown to go down the center of the canal. Broaching  with the Actis system was then performed starting at size 0  coursing  Up to size 4. A size 4 had excellent torsional and rotational  and axial stability. The trial standard offset neck was then  placed  with a 28 + 1.5 trial head. The hip was then reduced. We confirmed that  the stem was in the canal both on AP and lateral x-rays. It also has excellent sizing. The hip was reduced with outstanding stability through full extension and full external rotation.. AP pelvis was taken and the leg lengths were measured and found to be equal. Hip was then dislocated again and the femoral head and neck removed. The  femoral broach was removed. Size 4 Actis stem with a standard offset  neck was then impacted into the femur following native anteversion. Has  excellent purchase in the canal. Excellent torsional and rotational and  axial stability. It is confirmed to be in the canal on AP and lateral  fluoroscopic views. The 28 + 1.5 metal head was placed and the hip  reduced with outstanding stability. Again AP pelvis was taken and it  confirmed that the leg lengths were equal. The wound was then copiously  irrigated with saline solution and the capsule reattached and repaired  with Ethibond suture. 30 ml of .25% Bupivicaine was  injected into the capsule and into the edge of the tensor fascia lata as well as subcutaneous tissue. The fascia overlying the tensor fascia lata was then closed with a running #1 V-Loc. Subcu was closed with interrupted 2-0 Vicryl and subcuticular running 4-0 Monocryl. Incision was cleaned  and dried. Steri-Strips and a bulky sterile dressing applied. Hemovac  drain was hooked to suction and then the patient was awakened and transported to  recovery in stable condition.        Please note that a surgical assistant was a medical necessity for this procedure to perform it in a safe and expeditious manner. Assistant was necessary to provide appropriate retraction of vital neurovascular structures and to prevent femoral fracture and allow for anatomic placement of the prosthesis.  Gaynelle Arabian, M.D.

## 2019-03-02 NOTE — Anesthesia Postprocedure Evaluation (Signed)
Anesthesia Post Note  Patient: [name] [name]  Procedure(s) Performed: TOTAL HIP ARTHROPLASTY ANTERIOR APPROACH (Left Hip)     Patient location during evaluation: PACU Anesthesia Type: Spinal Level of consciousness: oriented and awake and alert Pain management: pain level controlled Vital Signs Assessment: post-procedure vital signs reviewed and stable Respiratory status: spontaneous breathing and respiratory function stable Cardiovascular status: blood pressure returned to baseline and stable Postop Assessment: no headache, no backache, no apparent nausea or vomiting and patient able to bend at knees Anesthetic complications: no    Last Vitals:  Vitals:   03/02/19 1600 03/02/19 1615  BP: 114/63 114/65  Pulse: (!) 52 (!) 50  Resp: 12 13  Temp:  (!) 36.3 C  SpO2: 100% 100%    Last Pain:  Vitals:   03/02/19 1615  TempSrc:   PainSc: 0-No pain                 Pervis Hocking

## 2019-03-02 NOTE — Discharge Instructions (Addendum)
°Dr. Frank Aluisio °Total Joint Specialist °[facility] °[address] °[place], [place] [address] °[phone] ° °ANTERIOR APPROACH TOTAL HIP REPLACEMENT POSTOPERATIVE DIRECTIONS ° ° °Hip Rehabilitation, Guidelines Following Surgery  °The results of a hip operation are greatly improved after range of motion and muscle strengthening exercises. Follow all safety measures which are given to protect your hip. If any of these exercises cause increased pain or swelling in your joint, decrease the amount until you are comfortable again. Then slowly increase the exercises. Call your caregiver if you have problems or questions.  ° °HOME CARE INSTRUCTIONS  °• Remove items at home which could result in a fall. This includes throw rugs or furniture in walking pathways.  °· ICE to the affected hip every three hours for 30 minutes at a time and then as needed for pain and swelling.  Continue to use ice on the hip for pain and swelling from surgery. You may notice swelling that will progress down to the foot and ankle.  This is normal after surgery.  Elevate the leg when you are not up walking on it.   °· Continue to use the breathing machine which will help keep your temperature down.  It is common for your temperature to cycle up and down following surgery, especially at night when you are not up moving around and exerting yourself.  The breathing machine keeps your lungs expanded and your temperature down. ° °DIET °You may resume your previous home diet once your are discharged from the hospital. ° °DRESSING / WOUND CARE / SHOWERING °You may shower 3 days after surgery, but keep the wounds dry during showering.  You may use an occlusive plastic wrap (Press'n Seal for example), NO SOAKING/SUBMERGING IN THE BATHTUB.  If the bandage gets wet, change with a clean dry gauze.  If the incision gets wet, pat the wound dry with a clean towel. °You may start showering once you are discharged home but do not submerge the  incision under water. Just pat the incision dry and apply a dry gauze dressing on daily. °Change the surgical dressing daily and reapply a dry dressing each time. ° °ACTIVITY °Walk with your walker as instructed. °Use walker as long as suggested by your caregivers. °Avoid periods of inactivity such as sitting longer than an hour when not asleep. This helps prevent blood clots.  °You may resume a sexual relationship in one month or when given the OK by your doctor.  °You may return to work once you are cleared by your doctor.  °Do not drive a car for 6 weeks or until released by you surgeon.  °Do not drive while taking narcotics. ° °WEIGHT BEARING °Weight bearing as tolerated with assist device (walker, cane, etc) as directed, use it as long as suggested by your surgeon or therapist, typically at least 4-6 weeks. ° °POSTOPERATIVE CONSTIPATION PROTOCOL °Constipation - defined medically as fewer than three stools per week and severe constipation as less than one stool per week. ° °One of the most common issues patients have following surgery is constipation.  Even if you have a regular bowel pattern at home, your normal regimen is likely to be disrupted due to multiple reasons following surgery.  Combination of anesthesia, postoperative narcotics, change in appetite and fluid intake all can affect your bowels.  In order to avoid complications following surgery, here are some recommendations in order to help you during your recovery period. ° °Colace (docusate) - Pick up an over-the-counter form   of Colace or another stool softener and take twice a day as long as you are requiring postoperative pain medications.  Take with a full glass of water daily.  If you experience loose stools or diarrhea, hold the colace until you stool forms back up.  If your symptoms do not get better within 1 week or if they get worse, check with your doctor. ° °Dulcolax (bisacodyl) - Pick up over-the-counter and take as directed by the product  packaging as needed to assist with the movement of your bowels.  Take with a full glass of water.  Use this product as needed if not relieved by Colace only.  ° °MiraLax (polyethylene glycol) - Pick up over-the-counter to have on hand.  MiraLax is a solution that will increase the amount of water in your bowels to assist with bowel movements.  Take as directed and can mix with a glass of water, juice, soda, coffee, or tea.  Take if you go more than two days without a movement. °Do not use MiraLax more than once per day. Call your doctor if you are still constipated or irregular after using this medication for 7 days in a row. ° °If you continue to have problems with postoperative constipation, please contact the office for further assistance and recommendations.  If you experience "the worst abdominal pain ever" or develop nausea or vomiting, please contact the office immediatly for further recommendations for treatment. ° °ITCHING ° If you experience itching with your medications, try taking only a single pain pill, or even half a pain pill at a time.  You can also use Benadryl over the counter for itching or also to help with sleep.  ° °TED HOSE STOCKINGS °Wear the elastic stockings on both legs for three weeks following surgery during the day but you may remove then at night for sleeping. ° °MEDICATIONS °See your medication summary on the “After Visit Summary” that the nursing staff will review with you prior to discharge.  You may have some home medications which will be placed on hold until you complete the course of blood thinner medication.  It is important for you to complete the blood thinner medication as prescribed by your surgeon.  Continue your approved medications as instructed at time of discharge. ° °PRECAUTIONS °If you experience chest pain or shortness of breath - call 911 immediately for transfer to the hospital emergency department.  °If you develop a fever greater that 101 F, purulent drainage  from wound, increased redness or drainage from wound, foul odor from the wound/dressing, or calf pain - CONTACT YOUR SURGEON.   °                                                °FOLLOW-UP APPOINTMENTS °Make sure you keep all of your appointments after your operation with your surgeon and caregivers. You should call the office at the above phone number and make an appointment for approximately two weeks after the date of your surgery or on the date instructed by your surgeon outlined in the "After Visit Summary". ° °RANGE OF MOTION AND STRENGTHENING EXERCISES  °These exercises are designed to help you keep full movement of your hip joint. Follow your caregiver's or physical therapist's instructions. Perform all exercises about fifteen times, three times per day or as directed. Exercise both hips, even if you have   had only one joint replacement. These exercises can be done on a training (exercise) mat, on the floor, on a table or on a bed. Use whatever works the best and is most comfortable for you. Use music or television while you are exercising so that the exercises are a pleasant break in your day. This will make your life better with the exercises acting as a break in routine you can look forward to.  °• Lying on your back, slowly slide your foot toward your buttocks, raising your knee up off the floor. Then slowly slide your foot back down until your leg is straight again.  °• Lying on your back spread your legs as far apart as you can without causing discomfort.  °• Lying on your side, raise your upper leg and foot straight up from the floor as far as is comfortable. Slowly lower the leg and repeat.  °• Lying on your back, tighten up the muscle in the front of your thigh (quadriceps muscles). You can do this by keeping your leg straight and trying to raise your heel off the floor. This helps strengthen the largest muscle supporting your knee.  °• Lying on your back, tighten up the muscles of your buttocks both  with the legs straight and with the knee bent at a comfortable angle while keeping your heel on the floor.  ° °IF YOU ARE TRANSFERRED TO A SKILLED REHAB FACILITY °If the patient is transferred to a skilled rehab facility following release from the hospital, a list of the current medications will be sent to the facility for the patient to continue.  When discharged from the skilled rehab facility, please have the facility set up the patient's Home Health Physical Therapy prior to being released. Also, the skilled facility will be responsible for providing the patient with their medications at time of release from the facility to include their pain medication, the muscle relaxants, and their blood thinner medication. If the patient is still at the rehab facility at time of the two week follow up appointment, the skilled rehab facility will also need to assist the patient in arranging follow up appointment in our office and any transportation needs. ° °MAKE SURE YOU:  °• Understand these instructions.  °• Get help right away if you are not doing well or get worse.  ° ° °Pick up stool softner and laxative for home use following surgery while on pain medications. °Do not submerge incision under water. °Please use good hand washing techniques while changing dressing each day. °May shower starting three days after surgery. °Please use a clean towel to pat the incision dry following showers. °Continue to use ice for pain and swelling after surgery. °Do not use any lotions or creams on the incision until instructed by your surgeon. ° °

## 2019-03-02 NOTE — Anesthesia Procedure Notes (Signed)
Spinal  Patient location during procedure: OR End time: 03/02/2019 1:58 PM Staffing Resident/CRNA: Caryl Pina T, CRNA Performed: resident/CRNA  Preanesthetic Checklist Completed: patient identified, site marked, surgical consent, pre-op evaluation, timeout performed, IV checked, risks and benefits discussed and monitors and equipment checked Spinal Block Patient position: sitting Prep: DuraPrep Patient monitoring: heart rate, cardiac monitor, continuous pulse ox and blood pressure Approach: midline Location: L3-4 Injection technique: single-shot Needle Needle type: Pencan  Needle gauge: 24 G Needle length: 9 cm Assessment Sensory level: T4 Additional Notes Expiration date of kit checked and confirmed. Patient tolerated procedure well, without complications.

## 2019-03-02 NOTE — Evaluation (Signed)
Physical Therapy Evaluation Patient Details Name: [name] [name] MRN: [id number] DOB: Mar 18, 1935 Today's Date: 03/02/2019   History of Present Illness  Patient is 84 y.o. female s/p Lt THA anterior appraoch on 03/02/19 with PMH significant for SVT, leukemia, breast cancer, and OA.    Clinical Impression  JOURI BARTHOLOMEW is a 84 y.o. female POD 0 s/p Lt THA anterior approach. Patient reports independence with mobility at baseline. Patient is now limited by functional impairments (see PT problem list below) and requires min assist/guard for transfers and gait with RW. Patient was able to ambulate ~100 feet with RW and min cues for safe use of walker. Patient instructed in exercise to facilitate circulation. Patient will benefit from continued skilled PT interventions to address impairments and progress towards PLOF. Acute PT will follow to progress mobility and stair training in preparation for safe discharge home.     Follow Up Recommendations Follow surgeon's recommendation for DC plan and follow-up therapies    Equipment Recommendations  Rolling walker with 5" wheels(short walker)    Recommendations for Other Services       Precautions / Restrictions Precautions Precautions: Fall Restrictions Weight Bearing Restrictions: No      Mobility  Bed Mobility Overal bed mobility: Needs Assistance Bed Mobility: Supine to Sit     Supine to sit: Supervision;HOB elevated     General bed mobility comments: pt using bed rails  Transfers Overall transfer level: Needs assistance Equipment used: Rolling walker (2 wheeled) Transfers: Sit to/from Stand Sit to Stand: Min guard         General transfer comment: cues for safe hand placement and technique with RW, no unsteadiness noted during rise to stand  Ambulation/Gait Ambulation/Gait assistance: Min assist;Min guard Gait Distance (Feet): 100 Feet Assistive device: Rolling walker (2 wheeled) Gait Pattern/deviations:  Step-through pattern;Decreased dorsiflexion - left Gait velocity: decreased   General Gait Details: cues for safe step pattern in walker initially and pt improved throuhgout, no overt LOB noted  Stairs            Wheelchair Mobility    Modified Rankin (Stroke Patients Only)       Balance Overall balance assessment: Needs assistance Sitting-balance support: Feet supported;No upper extremity supported Sitting balance-Leahy Scale: Good     Standing balance support: Bilateral upper extremity supported;During functional activity Standing balance-Leahy Scale: Fair                Pertinent Vitals/Pain Pain Assessment: No/denies pain    Home Living Family/patient expects to be discharged to:: Private residence Living Arrangements: Spouse/significant other Available Help at Discharge: Family;Available 24 hours/day Type of Home: House Home Access: Stairs to enter Entrance Stairs-Rails: None;Right;Left Entrance Stairs-Number of Steps: 1 threshold at back (no rails) 4 at front wide stairs 2 hand rials Home Layout: One level Home Equipment: Walker - 2 wheels      Prior Function Level of Independence: Independent               Hand Dominance   Dominant Hand: Right    Extremity/Trunk Assessment   Upper Extremity Assessment Upper Extremity Assessment: Overall WFL for tasks assessed    Lower Extremity Assessment Lower Extremity Assessment: Overall WFL for tasks assessed    Cervical / Trunk Assessment Cervical / Trunk Assessment: Normal  Communication   Communication: No difficulties  Cognition Arousal/Alertness: Awake/alert Behavior During Therapy: WFL for tasks assessed/performed Overall Cognitive Status: Within Functional Limits for tasks assessed  General Comments      Exercises Total Joint Exercises Ankle Circles/Pumps: AROM;15 reps;Seated;Both   Assessment/Plan    PT Assessment Patient needs continued PT services   PT Problem List Decreased strength;Decreased range of motion;Decreased mobility;Decreased balance;Decreased activity tolerance       PT Treatment Interventions DME instruction;Functional mobility training;Balance training;Patient/family education;Modalities;Gait training;Therapeutic activities;Therapeutic exercise;Stair training    PT Goals (Current goals can be found in the Care Plan section)  Acute Rehab PT Goals Patient Stated Goal: to get back home and start walking for exercise PT Goal Formulation: With patient Time For Goal Achievement: 03/09/19 Potential to Achieve Goals: Good    Frequency 7X/week    AM-PAC PT "6 Clicks" Mobility  Outcome Measure Help needed turning from your back to your side while in a flat bed without using bedrails?: A Little Help needed moving from lying on your back to sitting on the side of a flat bed without using bedrails?: A Little Help needed moving to and from a bed to a chair (including a wheelchair)?: A Little Help needed standing up from a chair using your arms (e.g., wheelchair or bedside chair)?: A Little Help needed to walk in hospital room?: A Little Help needed climbing 3-5 steps with a railing? : A Little 6 Click Score: 18    End of Session Equipment Utilized During Treatment: Gait belt Activity Tolerance: Patient tolerated treatment well Patient left: in chair;with call bell/phone within reach;with family/visitor present;with chair alarm set Nurse Communication: Mobility status PT Visit Diagnosis: Muscle weakness (generalized) (M62.81);Difficulty in walking, not elsewhere classified (R26.2)    Time: [id number] PT Time Calculation (min) (ACUTE ONLY): 34 min   Charges:   PT Evaluation $PT Eval Low Complexity: 1 Low PT Treatments $Gait Training: 8-22 mins        Kipp Brood, PT, DPT Physical Therapist with [facility]  03/02/2019 7:46 PM

## 2019-03-02 NOTE — Interval H&P Note (Signed)
History and Physical Interval Note:  03/02/2019 12:41 PM  [name] [name]  has presented today for surgery, with the diagnosis of left hip osteoarthritis.  The various methods of treatment have been discussed with the patient and family. After consideration of risks, benefits and other options for treatment, the patient has consented to  Procedure(s) with comments: [place] (Left) - 141min as a surgical intervention.  The patient's history has been reviewed, patient examined, no change in status, stable for surgery.  I have reviewed the patient's chart and labs.  Questions were answered to the patient's satisfaction.     Pilar Plate Wakeelah Solan

## 2019-03-03 ENCOUNTER — Encounter (HOSPITAL_COMMUNITY): Payer: Self-pay | Admitting: Orthopedic Surgery

## 2019-03-03 LAB — BASIC METABOLIC PANEL
Anion gap: 6 (ref 5–15)
BUN: 22 mg/dL (ref 8–23)
CO2: 26 mmol/L (ref 22–32)
Calcium: 8.4 mg/dL — ABNORMAL LOW (ref 8.9–10.3)
Chloride: 103 mmol/L (ref 98–111)
Creatinine, Ser: 1.01 mg/dL — ABNORMAL HIGH (ref 0.44–1.00)
GFR calc Af Amer: >60 mL/min (ref >60)
GFR calc non Af Amer: 52 mL/min — ABNORMAL LOW (ref >60)
Glucose, Bld: 148 mg/dL — ABNORMAL HIGH (ref 70–99)
Potassium: 4.5 mmol/L (ref 3.5–5.1)
Sodium: 135 mmol/L (ref 135–145)

## 2019-03-03 LAB — CBC
HCT: 21.8 % — ABNORMAL LOW (ref 36.0–46.0)
Hemoglobin: 7.2 g/dL — ABNORMAL LOW (ref 12.0–15.0)
MCH: 38.3 pg — ABNORMAL HIGH (ref 26.0–34.0)
MCHC: 33 g/dL (ref 30.0–36.0)
MCV: 116 fL — ABNORMAL HIGH (ref 80.0–100.0)
Platelets: 117 10*3/uL — ABNORMAL LOW (ref 150–400)
RBC: 1.88 MIL/uL — ABNORMAL LOW (ref 3.87–5.11)
RDW: 14 % (ref 11.5–15.5)
WBC: 7.6 10*3/uL (ref 4.0–10.5)
nRBC: 0 % (ref 0.0–0.2)

## 2019-03-03 LAB — PREPARE RBC (CROSSMATCH)

## 2019-03-03 LAB — HEMOGLOBIN AND HEMATOCRIT, BLOOD
HCT: 29.4 % — ABNORMAL LOW (ref 36.0–46.0)
Hemoglobin: 9.9 g/dL — ABNORMAL LOW (ref 12.0–15.0)

## 2019-03-03 MED ORDER — METHOCARBAMOL 500 MG PO TABS: 500.0000 mg | ORAL_TABLET | Freq: Four times a day (QID) | ORAL | 0 refills | Status: DC | PRN

## 2019-03-03 MED ORDER — SODIUM CHLORIDE 0.9% IV SOLUTION: Freq: Once | INTRAVENOUS | Status: DC

## 2019-03-03 MED ORDER — HYDROCODONE-ACETAMINOPHEN 5-325 MG PO TABS: 1.0000 | ORAL_TABLET | Freq: Four times a day (QID) | ORAL | 0 refills | Status: DC | PRN

## 2019-03-03 MED ORDER — TRAMADOL HCL 50 MG PO TABS: 50.0000 mg | ORAL_TABLET | Freq: Four times a day (QID) | ORAL | 0 refills | Status: DC | PRN

## 2019-03-03 MED ORDER — ASPIRIN 325 MG PO TBEC: 325.0000 mg | DELAYED_RELEASE_TABLET | Freq: Two times a day (BID) | ORAL | 0 refills | Status: AC

## 2019-03-03 MED ORDER — DIPHENHYDRAMINE HCL 25 MG PO CAPS
25.0000 mg | ORAL_CAPSULE | Freq: Once | ORAL | Status: DC
  Filled 2019-03-03: qty 1

## 2019-03-03 NOTE — Progress Notes (Signed)
Physical Therapy Treatment Patient Details Name: [name] [name] MRN: [id number] DOB: 24-Jun-1934 Today's Date: 03/03/2019    History of Present Illness Patient is 84 y.o. female s/p Lt THA anterior appraoch on 03/02/19 with PMH significant for SVT, leukemia, breast cancer, and OA.    PT Comments    Pt receiving 2nd unit of PRBCs and assisted with ambulating in hallway.  Will return for afternoon session to review exercises.    Follow Up Recommendations  Follow surgeon's recommendation for DC plan and follow-up therapies     Equipment Recommendations  Rolling walker with 5" wheels    Recommendations for Other Services       Precautions / Restrictions Precautions Precautions: Fall Restrictions Weight Bearing Restrictions: No    Mobility  Bed Mobility Overal bed mobility: Needs Assistance Bed Mobility: Supine to Sit;Sit to Supine     Supine to sit: Min assist Sit to supine: Min assist   General bed mobility comments: assist for L LE  Transfers Overall transfer level: Needs assistance Equipment used: Rolling walker (2 wheeled) Transfers: Sit to/from Stand Sit to Stand: Min guard         General transfer comment: verbal cues for hand placement  Ambulation/Gait Ambulation/Gait assistance: Min guard Gait Distance (Feet): 120 Feet Assistive device: Rolling walker (2 wheeled) Gait Pattern/deviations: Step-through pattern;Antalgic Gait velocity: decreased   General Gait Details: verbal cues for RW positioning, posture   Stairs             Wheelchair Mobility    Modified Rankin (Stroke Patients Only)       Balance                                            Cognition Arousal/Alertness: Awake/alert Behavior During Therapy: WFL for tasks assessed/performed Overall Cognitive Status: Within Functional Limits for tasks assessed                                        Exercises      General Comments         Pertinent Vitals/Pain Pain Assessment: 0-10 Pain Score: 3  Pain Location: L hip Pain Descriptors / Indicators: Aching;Sore Pain Intervention(s): Repositioned;Monitored during session    Home Living                      Prior Function            PT Goals (current goals can now be found in the care plan section) Progress towards PT goals: Progressing toward goals    Frequency    7X/week      PT Plan Current plan remains appropriate    Co-evaluation              AM-PAC PT "6 Clicks" Mobility   Outcome Measure  Help needed turning from your back to your side while in a flat bed without using bedrails?: A Little Help needed moving from lying on your back to sitting on the side of a flat bed without using bedrails?: A Little Help needed moving to and from a bed to a chair (including a wheelchair)?: A Little Help needed standing up from a chair using your arms (e.g., wheelchair or bedside chair)?: A Little Help needed to walk in hospital room?:  A Little Help needed climbing 3-5 steps with a railing? : A Little 6 Click Score: 18    End of Session Equipment Utilized During Treatment: Gait belt Activity Tolerance: Patient tolerated treatment well Patient left: with call bell/phone within reach;in bed;with bed alarm set Nurse Communication: Mobility status PT Visit Diagnosis: Muscle weakness (generalized) (M62.81);Difficulty in walking, not elsewhere classified (R26.2)     Time: [id number] PT Time Calculation (min) (ACUTE ONLY): 24 min  Charges:  $Gait Training: 8-22 mins                    Carmelia Bake, PT, [place] Office: [phone] Pager: [phone]  Trena Platt 03/03/2019, 3:37 PM

## 2019-03-03 NOTE — Progress Notes (Signed)
Physical Therapy Treatment Patient Details Name: [name] [name] MRN: [id number] DOB: 16-Oct-1934 Today's Date: 03/03/2019    History of Present Illness Patient is 84 y.o. female s/p Lt THA anterior appraoch on 03/02/19 with PMH significant for SVT, leukemia, breast cancer, and OA.    PT Comments    Pt performed LE exercises and provided with HEP handout.  Pt also ambulated in hallway and practiced steps.  Pt feels ready for d/c home today.   Follow Up Recommendations  Follow surgeon's recommendation for DC plan and follow-up therapies     Equipment Recommendations  Rolling walker with 5" wheels    Recommendations for Other Services       Precautions / Restrictions Precautions Precautions: Fall Restrictions Weight Bearing Restrictions: No    Mobility  Bed Mobility Overal bed mobility: Needs Assistance Bed Mobility: Supine to Sit     Supine to sit: Supervision Sit to supine: Min assist   General bed mobility comments: verbal cues for self assist  Transfers Overall transfer level: Needs assistance Equipment used: Rolling walker (2 wheeled) Transfers: Sit to/from Stand Sit to Stand: Supervision         General transfer comment: verbal cues for safe technique  Ambulation/Gait Ambulation/Gait assistance: Min guard Gait Distance (Feet): 160 Feet Assistive device: Rolling walker (2 wheeled) Gait Pattern/deviations: Step-through pattern;Antalgic Gait velocity: decreased   General Gait Details: verbal cues for RW positioning, posture   Stairs Stairs: Yes Stairs assistance: Min guard Stair Management: Step to pattern;Forwards;Two rails Number of Stairs: 2 General stair comments: pt able to recall correct sequence from past TKA; min/guard for safety   Wheelchair Mobility    Modified Rankin (Stroke Patients Only)       Balance                                            Cognition Arousal/Alertness: Awake/alert Behavior During  Therapy: WFL for tasks assessed/performed Overall Cognitive Status: Within Functional Limits for tasks assessed                                        Exercises Total Joint Exercises Ankle Circles/Pumps: AROM;Both;10 reps Quad Sets: AROM;Both;5 reps Heel Slides: 5 reps;AAROM;Left Hip ABduction/ADduction: AAROM;Left;5 reps;Standing;Supine Long Arc Quad: AROM;Left;5 reps;Seated Knee Flexion: AROM;Left;5 reps;Standing Marching in Standing: AROM;Left;5 reps;Standing Standing Hip Extension: AROM;Left;5 reps;Standing    General Comments        Pertinent Vitals/Pain Pain Assessment: 0-10 Pain Score: 3  Pain Location: L hip Pain Descriptors / Indicators: Aching;Sore Pain Intervention(s): Repositioned;Premedicated before session;Monitored during session    Home Living                      Prior Function            PT Goals (current goals can now be found in the care plan section) Progress towards PT goals: Progressing toward goals    Frequency    7X/week      PT Plan Current plan remains appropriate    Co-evaluation              AM-PAC PT "6 Clicks" Mobility   Outcome Measure  Help needed turning from your back to your side while in a flat bed without using bedrails?: A Little Help needed  moving from lying on your back to sitting on the side of a flat bed without using bedrails?: A Little Help needed moving to and from a bed to a chair (including a wheelchair)?: A Little Help needed standing up from a chair using your arms (e.g., wheelchair or bedside chair)?: A Little Help needed to walk in hospital room?: A Little Help needed climbing 3-5 steps with a railing? : A Little 6 Click Score: 18    End of Session Equipment Utilized During Treatment: Gait belt Activity Tolerance: Patient tolerated treatment well Patient left: with call bell/phone within reach;in bed;with family/visitor present Nurse Communication: Mobility status PT  Visit Diagnosis: Muscle weakness (generalized) (M62.81);Difficulty in walking, not elsewhere classified (R26.2)     Time: [id number] PT Time Calculation (min) (ACUTE ONLY): 29 min  Charges:  $Gait Training: 8-22 mins $Therapeutic Exercise: 8-22 mins                    Carmelia Bake, PT, DPT Acute Rehabilitation Services Office: [phone] Pager: [phone]  Trena Platt 03/03/2019, 3:52 PM

## 2019-03-03 NOTE — Progress Notes (Signed)
   Subjective: 1 Day Post-Op Procedure(s) (LRB): TOTAL HIP ARTHROPLASTY ANTERIOR APPROACH (Left) Patient reports pain as mild.   Patient seen in rounds by Dr. Wynelle Link. Patient is well, and has had no acute complaints or problems other than discomfort in the left hip. No acute events overnight.Patient ambulated 100 feet with PT yesterday.  Patient states she is ready to go home today. Foley catheter removed, positive flatus. Denies CP, SHOB.  We will continue. therapy today.   Objective: Vital signs in last 24 hours: Temp:  [97.4 F (36.3 C)-98.6 F (37 C)] 98.6 F (37 C) (11/03 [id number]) Pulse Rate:  [50-74] 66 (11/03 0720) Resp:  [11-17] 16 (11/03 0720) BP: (105-130)/(60-101) 106/60 (11/03 0720) SpO2:  [96 %-100 %] 100 % (11/03 0652) Weight:  [54 kg] 54 kg (11/02 1155)  Intake/Output from previous day:  Intake/Output Summary (Last 24 hours) at 03/03/2019 0741 Last data filed at 03/03/2019 0200 Gross per 24 hour  Intake 3033.55 ml  Output 1010 ml  Net 2023.55 ml     Intake/Output this shift: No intake/output data recorded.  Labs: Recent Labs    03/03/19 0253  HGB 7.2*   Recent Labs    03/03/19 0253  WBC 7.6  RBC 1.88*  HCT 21.8*  PLT 117*   Recent Labs    03/03/19 0253  NA 135  K 4.5  CL 103  CO2 26  BUN 22  CREATININE 1.01*  GLUCOSE 148*  CALCIUM 8.4*   No results for input(s): LABPT, INR in the last 72 hours.  Exam: General - Patient is Alert and Oriented Extremity - Neurologically intact Sensation intact distally Intact pulses distally Dorsiflexion/Plantar flexion intact Dressing - dressing C/D/I Motor Function - intact, moving foot and toes well on exam.   Past Medical History:  Diagnosis Date  . Anemia requiring transfusions 2010 & 2015  . Arthritis    DJD  . Breast cancer ([place]) 1993   S/P lumpectomy , radiation, & Tamoxifen  . CML (chronic myeloid leukemia) ([facility])    Dr Alvy Bimler;  . SVT (supraventricular tachycardia) ([place])    dx june 2020  , magd by Dr Percival Spanish ; reports occasional heart flutter    Assessment/Plan: 1 Day Post-Op Procedure(s) (LRB): TOTAL HIP ARTHROPLASTY ANTERIOR APPROACH (Left) Principal Problem:   OA (osteoarthritis) of hip  Estimated body mass index is 22.51 kg/m as calculated from the following:   Height as of this encounter: 5\' 1"  (1.549 m).   Weight as of this encounter: 54 kg. Advance diet Up with therapy  DVT Prophylaxis - Aspirin Weight bearing as tolerated. D/C O2 and pulse ox and try on room air. Hemovac pulled without difficulty, will begin therapy.  Plan is to go Home after hospital stay with HEP. Patient had low preoperative hemoglobin of 9.9, which is now 7.2 this morning. She will receive 2 units PRBCs today, with repeat hemoglobin and hematocrit following this. She is highly motivated to get home today, and should be able to as long as she is meeting goals with therapy today and has no other issues. Follow up in the office in 2 weeks.   Griffith Citron, PA-C Orthopedic Surgery [phone] 03/03/2019, 7:41 AM

## 2019-03-03 NOTE — TOC Transition Note (Signed)
Transition of Care [facility]) - CM/SW Discharge Note   Patient Details  Name: [name] MRN: [id number] Date of Birth: 25-Jul-1934  [facility]) CM/SW Contact:  Lia Hopping, [place] Phone Number: 03/03/2019, 10:24 AM   Clinical Narrative:    Therapy Plan: HEP  DME ordered through [place]    Final next level of care: Home/Self Care(HEP) Barriers to Discharge: No Barriers Identified   Patient Goals and CMS Choice Patient states their goals for this hospitalization and ongoing recovery are:: Continue therapy at home   Choice offered to / list presented to : NA  Discharge Placement                       Discharge Plan and Services                DME Arranged: 3-N-1, Walker rolling DME Agency: [employer] Date DME Agency Contacted: 03/03/19 Time DME Agency Contacted: 0900 Representative spoke with at DME Agency: [place] ([place]) Interventions     Readmission Risk Interventions No flowsheet data found.

## 2019-03-04 ENCOUNTER — Telehealth: Payer: Self-pay | Admitting: *Deleted

## 2019-03-04 LAB — TYPE AND SCREEN
ABO/RH(D): O POS
Antibody Screen: NEGATIVE
Unit division: 0
Unit division: 0

## 2019-03-04 LAB — BPAM RBC
Blood Product Expiration Date: 202012052359
Blood Product Expiration Date: 202012052359
ISSUE DATE / TIME: 202011030715
ISSUE DATE / TIME: 202011031102
Unit Type and Rh: 5100
Unit Type and Rh: 5100

## 2019-03-04 NOTE — Telephone Encounter (Signed)
Pt was on TCM report patient is admitted 03/02/19 for left total hip arthroplasty. Pt tolerated procedure well, and was d/c 03/03/19. Pt will be following up w/specialist Dr. Synthia Innocent in 2 weeks.Marland KitchenJohny Chess

## 2019-03-09 NOTE — Discharge Summary (Signed)
Physician Discharge Summary   Patient ID: XOE MECKLER MRN: [id number] DOB/AGE: 09-30-1934 84 y.o.  Admit date: 03/02/2019 Discharge date: 03/03/2019  Primary Diagnosis: Osteoarthritis of the Left  hip.   Admission Diagnoses:  Past Medical History:  Diagnosis Date  . Anemia requiring transfusions 2010 & 2015  . Arthritis    DJD  . Breast cancer ([facility]) 1993   S/P lumpectomy , radiation, & Tamoxifen  . CML (chronic myeloid leukemia) (H[facility])    Dr Alvy Bimler;  . SVT (supraventricular tachycardia) [facility])    dx june 2020 , magd by Dr Percival Spanish ; reports occasional heart flutter   Discharge Diagnoses:   Principal Problem:   OA (osteoarthritis) of hip  Estimated body mass index is 22.51 kg/m as calculated from the following:   Height as of this encounter: 5\' 1"  (1.549 m).   Weight as of this encounter: 54 kg.  Procedure:  Procedure(s) (LRB): TOTAL HIP ARTHROPLASTY ANTERIOR APPROACH (Left)   Consults: None  HPI:  [name] [name] is a 84 y.o. female who has advanced end-  stage arthritis of their Left  hip with progressively worsening pain and  dysfunction.The patient has failed nonoperative management and presents for  total hip arthroplasty.   Laboratory Data: Admission on 03/02/2019, Discharged on 03/03/2019  Component Date Value Ref Range Status  . WBC 03/03/2019 7.6  4.0 - 10.5 K/uL Final  . RBC 03/03/2019 1.88* 3.87 - 5.11 MIL/uL Final  . Hemoglobin 03/03/2019 7.2* 12.0 - 15.0 g/dL Final  . HCT 03/03/2019 21.8* 36.0 - 46.0 % Final  . MCV 03/03/2019 116.0* 80.0 - 100.0 fL Final  . MCH 03/03/2019 38.3* 26.0 - 34.0 pg Final  . MCHC 03/03/2019 33.0  30.0 - 36.0 g/dL Final  . RDW 03/03/2019 14.0  11.5 - 15.5 % Final  . Platelets 03/03/2019 117* 150 - 400 K/uL Final   Comment: REPEATED TO VERIFY PLATELET COUNT CONFIRMED BY SMEAR SPECIMEN CHECKED FOR CLOTS Immature Platelet Fraction may be clinically indicated, consider ordering this additional test [id number]   . nRBC  03/03/2019 0.0  0.0 - 0.2 % Final   Performed at [facility] [address], [place], [place] [address]  . Sodium 03/03/2019 135  135 - 145 mmol/L Final  . Potassium 03/03/2019 4.5  3.5 - 5.1 mmol/L Final  . Chloride 03/03/2019 103  98 - 111 mmol/L Final  . CO2 03/03/2019 26  22 - 32 mmol/L Final  . Glucose, Bld 03/03/2019 148* 70 - 99 mg/dL Final  . BUN 03/03/2019 22  8 - 23 mg/dL Final  . Creatinine, Ser 03/03/2019 1.01* 0.44 - 1.00 mg/dL Final  . Calcium 03/03/2019 8.4* 8.9 - 10.3 mg/dL Final  . GFR calc non Af Amer 03/03/2019 52* >60 mL/min Final  . GFR calc Af Amer 03/03/2019 >60  >60 mL/min Final  . Anion gap 03/03/2019 6  5 - 15 Final   Performed at [facility], [place] [address] [place], [place] [address]  . Order Confirmation 03/03/2019    Final                   Value:ORDER PROCESSED BY BLOOD BANK Performed at [facility], [place] [address], [place], [place] [address]   . Hemoglobin 03/03/2019 9.9* 12.0 - 15.0 g/dL Final   Comment: REPEATED TO VERIFY POST TRANSFUSION SPECIMEN   . HCT 03/03/2019 29.4* 36.0 - 46.0 % Final   Performed at [facility]  [address] [place], [place] [address]  Hospital Outpatient Visit on 02/26/2019  Component Date Value Ref Range Status  . SARS-CoV-2, NAA 02/26/2019 NOT DETECTED  NOT DETECTED Final   Comment: (NOTE) This nucleic acid amplification test was developed and its performance characteristics determined by [employer]. Nucleic acid amplification tests include PCR and TMA. This test has not been FDA cleared or approved. This test has been authorized by FDA under an Emergency Use Authorization (EUA). This test is only authorized for the duration of time the declaration that circumstances exist justifying the authorization of the emergency use of in vitro diagnostic tests for detection of SARS-CoV-2 virus and/or diagnosis of COVID-19 infection under  section 564(b)(1) of the Act, 21 U.S.C. [id number]) (1), unless the authorization is terminated or revoked sooner. When diagnostic testing is negative, the possibility of a false negative result should be considered in the context of a patient's recent exposures and the presence of clinical signs and symptoms consistent with COVID-19. An individual without symptoms of COVID- 19 and who is not shedding SARS-CoV-2 vi                          rus would expect to have a negative (not detected) result in this assay. Performed At: [facility] [place], [place] [id number] Rush Farmer MD [id number]   . Coronavirus Source 02/26/2019 NASOPHARYNGEAL   Final   Performed at [facility], [place] [address] [place], [place] [address]  Hospital Outpatient Visit on 02/26/2019  Component Date Value Ref Range Status  . aPTT 02/26/2019 29  24 - 36 seconds Final   Performed at [facility], [place] [address] [place], [place] [address]  . WBC 02/26/2019 3.9* 4.0 - 10.5 K/uL Final  . RBC 02/26/2019 2.58* 3.87 - 5.11 MIL/uL Final  . Hemoglobin 02/26/2019 9.9* 12.0 - 15.0 g/dL Final  . HCT 02/26/2019 29.9* 36.0 - 46.0 % Final  . MCV 02/26/2019 115.9* 80.0 - 100.0 fL Final  . MCH 02/26/2019 38.4* 26.0 - 34.0 pg Final  . MCHC 02/26/2019 33.1  30.0 - 36.0 g/dL Final  . RDW 02/26/2019 14.0  11.5 - 15.5 % Final  . Platelets 02/26/2019 147* 150 - 400 K/uL Final  . nRBC 02/26/2019 0.0  0.0 - 0.2 % Final   Performed at [facility], [place] [address] [place], [place] [address]  . Sodium 02/26/2019 138  135 - 145 mmol/L Final  . Potassium 02/26/2019 4.7  3.5 - 5.1 mmol/L Final  . Chloride 02/26/2019 103  98 - 111 mmol/L Final  . CO2 02/26/2019 27  22 - 32 mmol/L Final  . Glucose, Bld 02/26/2019 98  70 - 99 mg/dL Final  . BUN 02/26/2019 24* 8 - 23 mg/dL Final  . Creatinine, Ser 02/26/2019 0.99  0.44 - 1.00 mg/dL Final  . Calcium 02/26/2019 9.1   8.9 - 10.3 mg/dL Final  . Total Protein 02/26/2019 6.5  6.5 - 8.1 g/dL Final  . Albumin 02/26/2019 4.1  3.5 - 5.0 g/dL Final  . AST 02/26/2019 32  15 - 41 U/L Final  . ALT 02/26/2019 20  0 - 44 U/L Final  . Alkaline Phosphatase 02/26/2019 52  38 - 126 U/L Final  . Total Bilirubin 02/26/2019 0.8  0.3 - 1.2 mg/dL Final  . GFR calc non Af Amer 02/26/2019 53* >60 mL/min Final  . GFR calc Af Amer 02/26/2019 >60  >60 mL/min Final  .  Anion gap 02/26/2019 8  5 - 15 Final   Performed at [facility], [place] [address] [place], [place] [address]  . Prothrombin Time 02/26/2019 13.5  11.4 - 15.2 seconds Final  . INR 02/26/2019 1.0  0.8 - 1.2 Final   Comment: (NOTE) INR goal varies based on device and disease states. Performed at [facility], [place] [address], [place], [place] [address]   . ABO/RH(D) 02/26/2019 O POS   Final  . Antibody Screen 02/26/2019 NEG   Final  . Sample Expiration 02/26/2019 03/05/2019,2359   Final  . Extend sample reason 02/26/2019 NO TRANSFUSIONS OR PREGNANCY IN THE PAST 3 MONTHS   Final  . Unit Number 02/26/2019 [id number]   Final  . Blood Component Type 02/26/2019 RED CELLS,LR   Final  . Unit division 02/26/2019 00   Final  . Status of Unit 02/26/2019 ISSUED,FINAL   Final  . Transfusion Status 02/26/2019 OK TO TRANSFUSE   Final  . Crossmatch Result 02/26/2019 Compatible   Final  . Unit Number 02/26/2019 [id number]   Final  . Blood Component Type 02/26/2019 RED CELLS,LR   Final  . Unit division 02/26/2019 00   Final  . Status of Unit 02/26/2019 ISSUED,FINAL   Final  . Transfusion Status 02/26/2019 OK TO TRANSFUSE   Final  . Crossmatch Result 02/26/2019    Final                   Value:Compatible Performed at [facility], [place] [address] [place], [place] [address]   . MRSA, PCR 02/26/2019 NEGATIVE  NEGATIVE Final  . Staphylococcus aureus 02/26/2019 NEGATIVE  NEGATIVE Final   Comment: (NOTE) The Xpert SA  Assay (FDA approved for NASAL specimens in patients 68 years of age and older), is one component of a comprehensive surveillance program. It is not intended to diagnose infection nor to guide or monitor treatment. Performed at [facility], [place] [address] [place], [place] [address]   . ISSUE DATE / TIME 02/26/2019 [id number]   Final  . Blood Product Unit Number 02/26/2019 [id number]   Final  . PRODUCT CODE 02/26/2019 [id number]   Final  . Unit Type and Rh 02/26/2019 5100   Final  . Blood Product Expiration Date 02/26/2019 [id number]   Final  . ISSUE DATE / TIME 02/26/2019 [id number]   Final  . Blood Product Unit Number 02/26/2019 [id number]   Final  . PRODUCT CODE 02/26/2019 [id number]   Final  . Unit Type and Rh 02/26/2019 5100   Final  . Blood Product Expiration Date 02/26/2019 [id number]   Final     X-Rays:Dg Pelvis Portable  Result Date: 03/02/2019 CLINICAL DATA:  Status post left hip replacement today. EXAM: PORTABLE PELVIS 1-2 VIEWS COMPARISON:  None. FINDINGS: Left total hip arthroplasty is in place. The device is located. No fracture. Gas in the soft tissues and surgical drain are noted. Soft tissue anchor projects adjacent to the left femoral neck and a second anchor is seen in the soft tissues peripheral and superior to the left greater trochanter. IMPRESSION: Status post left hip replacement.  No acute abnormality. Soft tissue anchors adjacent to the superior margin of the left femoral neck and in the soft tissues superior to the left greater trochanter noted. Electronically Signed   By: Inge Rise M.D.   On: 03/02/2019 16:07   Dg C-arm 1-60 Min-no Report  Result Date: 03/02/2019 Fluoroscopy was utilized by the requesting physician.  No radiographic  interpretation.   Dg Hip Operative Unilat W Or W/o Pelvis Left  Result Date: 03/02/2019 CLINICAL DATA:  Post LEFT total hip replacement anterior approach EXAM: OPERATIVE LEFT HIP (WITH PELVIS IF  PERFORMED) 2 VIEWS TECHNIQUE: Fluoroscopic spot image(s) were submitted for interpretation post-operatively. COMPARISON:  None FLUOROSCOPY TIME:  0 minutes 8 seconds Images submitted: 2 Dose: 1.1 mGy FINDINGS: Osseous demineralization. LEFT hip prosthesis identified in expected position. No fracture, dislocation, or bone destruction. IMPRESSION: LEFT hip prosthesis without acute complication. Electronically Signed   By: Lavonia Dana M.D.   On: 03/02/2019 15:19    EKG: Orders placed or performed during the hospital encounter of 09/13/18  . EKG 12-Lead  . EKG 12-Lead  . EKG     Hospital Course: BAELEE WOEHRLE is a 84 y.o. who was admitted to [facility]. They were brought to the operating room on 03/02/2019 and underwent Procedure(s): [place].  Patient tolerated the procedure well and was later transferred to the recovery room and then to the orthopaedic floor for postoperative care. They were given PO and IV analgesics for pain control following their surgery. They were given 24 hours of postoperative antibiotics of  Anti-infectives (From admission, onward)   Start     Dose/Rate Route Frequency Ordered Stop   03/02/19 2000  ceFAZolin (ANCEF) IVPB 1 g/50 mL premix     1 g 100 mL/hr over 30 Minutes Intravenous Every 6 hours 03/02/19 1651 03/03/19 0217   03/02/19 1130  ceFAZolin (ANCEF) IVPB 2g/100 mL premix     2 g 200 mL/hr over 30 Minutes Intravenous On call to O.R. 03/02/19 1126 03/02/19 1403     and started on DVT prophylaxis in the form of Aspirin.   PT and OT were ordered for total joint protocol. Discharge planning consulted to help with postop disposition and equipment needs.  Patient had a good night on the evening of surgery. They started to get up OOB with therapy on POD #0. Pt was seen during rounds and was ready to go home pending progress with therapy.  She had a low pre-operative hemoglobin, at 9.9 which reduced to 7.2 on POD #1. She had a  transfusion of 2 units, which she tolerated well. Her hemoglobin was repeated at 9.9. Hemovac drain was pulled without difficulty. She worked with therapy on POD #1 and was meeting her goals. Pt was discharged to home later that day in stable condition.   Diet: Regular diet Activity: WBAT Follow-up: in 2 weeks Disposition: Home Discharged Condition: good   Discharge Instructions    Call MD / Call 911   Complete by: As directed    If you experience chest pain or shortness of breath, CALL 911 and be transported to the hospital emergency room.  If you develope a fever above 101 F, pus (white drainage) or increased drainage or redness at the wound, or calf pain, call your surgeon's office.   Change dressing   Complete by: As directed    You may change your dressing on Wednesday, then change the dressing daily with sterile 4 x 4 inch gauze dressing and paper tape.   Constipation Prevention   Complete by: As directed    Drink plenty of fluids.  Prune juice may be helpful.  You may use a stool softener, such as Colace (over the counter) 100 mg twice a day.  Use MiraLax (over the counter) for constipation as needed.   Diet - low sodium heart healthy  Complete by: As directed    Discharge instructions   Complete by: As directed    Dr. Gaynelle Arabian Total Joint Specialist [facility] [address], [place], [place] [address] [phone]  ANTERIOR APPROACH TOTAL HIP REPLACEMENT POSTOPERATIVE DIRECTIONS   Hip Rehabilitation, Guidelines Following Surgery  The results of a hip operation are greatly improved after range of motion and muscle strengthening exercises. Follow all safety measures which are given to protect your hip. If any of these exercises cause increased pain or swelling in your joint, decrease the amount until you are comfortable again. Then slowly increase the exercises. Call your caregiver if you have problems or questions.   HOME CARE INSTRUCTIONS  Remove items  at home which could result in a fall. This includes throw rugs or furniture in walking pathways.  ICE to the affected hip every three hours for 30 minutes at a time and then as needed for pain and swelling.  Continue to use ice on the hip for pain and swelling from surgery. You may notice swelling that will progress down to the foot and ankle.  This is normal after surgery.  Elevate the leg when you are not up walking on it.   Continue to use the breathing machine which will help keep your temperature down.  It is common for your temperature to cycle up and down following surgery, especially at night when you are not up moving around and exerting yourself.  The breathing machine keeps your lungs expanded and your temperature down.  DIET You may resume your previous home diet once your are discharged from the hospital.  DRESSING / WOUND CARE / SHOWERING You may change your dressing 3-5 days after surgery.  Then change the dressing every day with sterile gauze.  Please use good hand washing techniques before changing the dressing.  Do not use any lotions or creams on the incision until instructed by your surgeon. You may start showering once you are discharged home but do not submerge the incision under water. Just pat the incision dry and apply a dry gauze dressing on daily. Change the surgical dressing daily and reapply a dry dressing each time.  ACTIVITY Walk with your walker as instructed. Use walker as long as suggested by your caregivers. Avoid periods of inactivity such as sitting longer than an hour when not asleep. This helps prevent blood clots.  You may resume a sexual relationship in one month or when given the OK by your doctor.  You may return to work once you are cleared by your doctor.  Do not drive a car for 6 weeks or until released by you surgeon.  Do not drive while taking narcotics.  WEIGHT BEARING Weight bearing as tolerated with assist device (walker, cane, etc) as  directed, use it as long as suggested by your surgeon or therapist, typically at least 4-6 weeks.  POSTOPERATIVE CONSTIPATION PROTOCOL Constipation - defined medically as fewer than three stools per week and severe constipation as less than one stool per week.  One of the most common issues patients have following surgery is constipation.  Even if you have a regular bowel pattern at home, your normal regimen is likely to be disrupted due to multiple reasons following surgery.  Combination of anesthesia, postoperative narcotics, change in appetite and fluid intake all can affect your bowels.  In order to avoid complications following surgery, here are some recommendations in order to help you during your recovery period.  Colace (docusate) -  Pick up an over-the-counter form of Colace or another stool softener and take twice a day as long as you are requiring postoperative pain medications.  Take with a full glass of water daily.  If you experience loose stools or diarrhea, hold the colace until you stool forms back up.  If your symptoms do not get better within 1 week or if they get worse, check with your doctor.  Dulcolax (bisacodyl) - Pick up over-the-counter and take as directed by the product packaging as needed to assist with the movement of your bowels.  Take with a full glass of water.  Use this product as needed if not relieved by Colace only.   MiraLax (polyethylene glycol) - Pick up over-the-counter to have on hand.  MiraLax is a solution that will increase the amount of water in your bowels to assist with bowel movements.  Take as directed and can mix with a glass of water, juice, soda, coffee, or tea.  Take if you go more than two days without a movement. Do not use MiraLax more than once per day. Call your doctor if you are still constipated or irregular after using this medication for 7 days in a row.  If you continue to have problems with postoperative constipation, please contact the  office for further assistance and recommendations.  If you experience "the worst abdominal pain ever" or develop nausea or vomiting, please contact the office immediatly for further recommendations for treatment.  ITCHING  If you experience itching with your medications, try taking only a single pain pill, or even half a pain pill at a time.  You can also use Benadryl over the counter for itching or also to help with sleep.   TED HOSE STOCKINGS Wear the elastic stockings on both legs for three weeks following surgery during the day but you may remove then at night for sleeping.  MEDICATIONS See your medication summary on the "After Visit Summary" that the nursing staff will review with you prior to discharge.  You may have some home medications which will be placed on hold until you complete the course of blood thinner medication.  It is important for you to complete the blood thinner medication as prescribed by your surgeon.  Continue your approved medications as instructed at time of discharge.  PRECAUTIONS If you experience chest pain or shortness of breath - call 911 immediately for transfer to the hospital emergency department.  If you develop a fever greater that 101 F, purulent drainage from wound, increased redness or drainage from wound, foul odor from the wound/dressing, or calf pain - CONTACT YOUR SURGEON.                                                   FOLLOW-UP APPOINTMENTS Make sure you keep all of your appointments after your operation with your surgeon and caregivers. You should call the office at the above phone number and make an appointment for approximately two weeks after the date of your surgery or on the date instructed by your surgeon outlined in the "After Visit Summary".  RANGE OF MOTION AND STRENGTHENING EXERCISES  These exercises are designed to help you keep full movement of your hip joint. Follow your caregiver's or physical therapist's instructions. Perform all  exercises about fifteen times, three times per day or as directed. Exercise both  hips, even if you have had only one joint replacement. These exercises can be done on a training (exercise) mat, on the floor, on a table or on a bed. Use whatever works the best and is most comfortable for you. Use music or television while you are exercising so that the exercises are a pleasant break in your day. This will make your life better with the exercises acting as a break in routine you can look forward to.  Lying on your back, slowly slide your foot toward your buttocks, raising your knee up off the floor. Then slowly slide your foot back down until your leg is straight again.  Lying on your back spread your legs as far apart as you can without causing discomfort.  Lying on your side, raise your upper leg and foot straight up from the floor as far as is comfortable. Slowly lower the leg and repeat.  Lying on your back, tighten up the muscle in the front of your thigh (quadriceps muscles). You can do this by keeping your leg straight and trying to raise your heel off the floor. This helps strengthen the largest muscle supporting your knee.  Lying on your back, tighten up the muscles of your buttocks both with the legs straight and with the knee bent at a comfortable angle while keeping your heel on the floor.   IF YOU ARE TRANSFERRED TO A SKILLED REHAB FACILITY If the patient is transferred to a skilled rehab facility following release from the hospital, a list of the current medications will be sent to the facility for the patient to continue.  When discharged from the skilled rehab facility, please have the facility set up the patient's [place] prior to being released. Also, the skilled facility will be responsible for providing the patient with their medications at time of release from the facility to include their pain medication, the muscle relaxants, and their blood thinner medication. If  the patient is still at the rehab facility at time of the two week follow up appointment, the skilled rehab facility will also need to assist the patient in arranging follow up appointment in our office and any transportation needs.  MAKE SURE YOU:  Understand these instructions.  Get help right away if you are not doing well or get worse.    Pick up stool softner and laxative for home use following surgery while on pain medications. Do not submerge incision under water. Please use good hand washing techniques while changing dressing each day. May shower starting three days after surgery. Please use a clean towel to pat the incision dry following showers. Continue to use ice for pain and swelling after surgery. Do not use any lotions or creams on the incision until instructed by your surgeon.   Do not sit on low chairs, stoools or toilet seats, as it may be difficult to get up from low surfaces   Complete by: As directed    Driving restrictions   Complete by: As directed    No driving for two weeks   TED hose   Complete by: As directed    Use stockings (TED hose) for three weeks on both leg(s).  You may remove them at night for sleeping.   Weight bearing as tolerated   Complete by: As directed      Allergies as of 03/03/2019      Reactions   Penicillins Itching, Rash   Hives Because of a history of documented adverse serious  drug reaction;Medi Alert bracelet  is recommended Has patient had a PCN reaction causing immediate rash, facial/tongue/throat swelling, SOB or lightheadedness with hypotension: No Has patient had a PCN reaction causing severe rash involving mucus membranes or skin necrosis: Yes Has patient had a PCN reaction that required hospitalization: No Has patient had a PCN reaction occurring within the last 10 years: No If all of the above answers are      Medication List    STOP taking these medications   aspirin 81 MG tablet Replaced by: aspirin 325 MG EC tablet    ibuprofen 200 MG tablet Commonly known as: ADVIL     TAKE these medications   Align 4 MG Caps Take 1 capsule by mouth every other day.   aspirin 325 MG EC tablet Take 1 tablet (325 mg total) by mouth 2 (two) times daily for 20 days. Take one tablet (325 mg) Aspirin two times a day for three weeks following surgery.Then resume one baby Aspirin (81 mg) once a day. Replaces: aspirin 81 MG tablet   B-12 5000 MCG Caps Take 1 tablet by mouth daily.   cholecalciferol 25 MCG (1000 UT) tablet Commonly known as: VITAMIN D3 Take 1,000 Units by mouth daily.   estradiol 0.1 MG/GM vaginal cream Commonly known as: ESTRACE Insert 1 applicator full 2 times weekly. Need office visit for more refills.   Finacea 15 % cream Generic drug: Azelaic Acid Apply 1 application topically daily.   folic acid 1 MG tablet Commonly known as: FOLVITE TAKE 1 TABLET(1 MG) BY MOUTH DAILY   Glucosamine Chondr 1500 Complx Caps Take 1 capsule by mouth daily.   HYDROcodone-acetaminophen 5-325 MG tablet Commonly known as: NORCO/VICODIN Take 1-2 tablets by mouth every 6 (six) hours as needed for severe pain.   imatinib 100 MG tablet Commonly known as: GLEEVEC TAKE 3 TABLETS(300 MG) BY MOUTH DAILY What changed: See the new instructions.   IMODIUM PO Take 0.5-1 tablets by mouth 2 (two) times daily as needed (takes 0.5 tablet every morning and 2nd dose if needed for diarrhea).   methocarbamol 500 MG tablet Commonly known as: ROBAXIN Take 1 tablet (500 mg total) by mouth every 6 (six) hours as needed for muscle spasms.   metoprolol tartrate 25 MG tablet Commonly known as: LOPRESSOR Take 1 tablet (25 mg total) by mouth 2 (two) times daily.   multivitamin tablet Take 1 tablet by mouth daily.   PreserVision AREDS 2 Caps Take 1 capsule by mouth 2 (two) times a day.   traMADol 50 MG tablet Commonly known as: ULTRAM Take 1-2 tablets (50-100 mg total) by mouth every 6 (six) hours as needed for moderate  pain.            Discharge Care Instructions  (From admission, onward)         Start     Ordered   03/03/19 0000  Weight bearing as tolerated     03/03/19 0748   03/03/19 0000  Change dressing    Comments: You may change your dressing on Wednesday, then change the dressing daily with sterile 4 x 4 inch gauze dressing and paper tape.   03/03/19 0748         Follow-up Information    Gaynelle Arabian, MD. Schedule an appointment as soon as possible for a visit on 03/10/2019.   Specialty: Orthopedic Surgery Contact information: [address] [place] [address] [id number]           Signed: Caryl Pina  Nehemiah Settle, PA-C Orthopedic Surgery 03/09/2019, 4:45 PM

## 2019-03-30 NOTE — Progress Notes (Addendum)
Subjective:   [name] [name] is a 84 y.o. female who presents for Medicare Annual (Subsequent) preventive examination.  This visit occurred during the SARS-CoV-2 public health emergency.  Safety protocols were in place, including screening questions prior to the visit, additional usage of staff PPE, and extensive cleaning of exam room while observing appropriate contact time as indicated for disinfecting solutions.   Review of Systems:   Cardiac Risk Factors include: advanced age (>22men, >17 women);dyslipidemia Sleep patterns: feels rested on waking, gets up 1-2 times nightly to void and sleeps 7 hours nightly.    Home Safety/Smoke Alarms: Feels safe in home. Smoke alarms in place.  Living environment; residence and Firearm Safety: 2-story house. Lives with husband, no needs for DME, good support system Seat Belt Safety/Bike Helmet: Wears seat belt.     Objective:     Vitals: BP (!) 108/56   Pulse (!) 58   Resp 16   Ht 5\' 2"  (1.575 m)   Wt 116 lb (52.6 kg)   SpO2 98%   BMI 21.22 kg/m   Body mass index is 21.22 kg/m.  Advanced Directives 03/31/2019 03/02/2019 03/02/2019 02/26/2019 09/13/2018 10/24/2016 10/23/2016  Does Patient Have a Medical Advance Directive? Yes - Yes Yes Yes Yes Yes  Type of Paramedic of [place];Living will Living will - Living will [place];Living will [place];Living will [place];Living will  Does patient want to make changes to medical advance directive? - No - Patient declined No - Patient declined No - Patient declined - No - Patient declined No - Patient declined  Copy of [place] in Chart? No - copy requested - - - - No - copy requested No - copy requested  Pre-existing out of facility DNR order (yellow form or pink MOST form) - - - - - - -    Tobacco Social History   Tobacco Use  Smoking Status Former Smoker  . Years: 5.00  . Types:  Cigarettes  . Quit date: 04/30/1962  . Years since quitting: 56.9  Smokeless Tobacco Never Used  Tobacco Comment   smoked 1956-1964, up to 1/2 ppd     Counseling given: Not Answered Comment: smoked 1956-1964, up to 1/2 ppd  Past Medical History:  Diagnosis Date  . Anemia requiring transfusions 2010 & 2015  . Arthritis    DJD  . Breast cancer ([place]) 1993   S/P lumpectomy , radiation, & Tamoxifen  . CML (chronic myeloid leukemia) (H[facility])    Dr Alvy Bimler;  . SVT (supraventricular tachycardia) [facility])    dx june 2020 , magd by Dr Percival Spanish ; reports occasional heart flutter   Past Surgical History:  Procedure Laterality Date  . ABDOMINAL HYSTERECTOMY  ~1989   no BSO, for Dysplasia   . APPENDECTOMY  ~1989  . BILATERAL SALPINGOOPHORECTOMY  2010   with bowel resection  . BREAST SURGERY     Right lumpectomy, s/p radiation 1993 and oral  Tamoxifen x 5 years [id number])   . CATARACT EXTRACTION  06/08/2014   left eye;Dr Herbert Deaner  . CATARACT EXTRACTION, BILATERAL Bilateral 2015  . COLONOSCOPY  03/2003, last 2010   diverticulosis, Dr.Kaplan  . EXCISION MORTON'S NEUROMA Left mid 80's   foot  . gastrocslide     left leg; Dr Beola Cord  . ileostomy reversal  2011   3 mos after above  . KNEE SURGERY  mid 80's   Arthroscopy, right knee   . LEG TENDON  SURGERY Left 2013   "cut on leg and fixed something"  . OPEN SURGICAL REPAIR OF GLUTEAL TENDON Left 10/24/2016   Procedure: Left hip bursectomy; gluteal tendon repair;  Surgeon: Gaynelle Arabian, MD;  Location: [facility];  Service: Orthopedics;  Laterality: Left;  . sigmoid colon resection  2010    Dr Arlyce Dice, [place] for fistula & diverticulitis  . TENDON REPAIR Left 2019   left shoulder tendon repair - surgical center   . TOTAL HIP ARTHROPLASTY Left 03/02/2019   Procedure: TOTAL HIP ARTHROPLASTY ANTERIOR APPROACH;  Surgeon: Gaynelle Arabian, MD;  Location: [facility];  Service: Orthopedics;  Laterality: Left;  112min  . TOTAL KNEE ARTHROPLASTY Right 06/11/2013    Procedure: RIGHT TOTAL KNEE ARTHROPLASTY;  Surgeon: Sydnee Cabal, MD;  Location: [facility];  Service: Orthopedics;  Laterality: Right;  . TRIGGER FINGER RELEASE Left 2013   thumb   Family History  Problem Relation Age of Onset  . Colon cancer Mother 23  . Uterine cancer Sister   . Diabetes Cousin   . Cancer Father        Lip cancer  . Aneurysm Father        AAA;S/P surgery  . Breast cancer Paternal Aunt   . Heart attack Paternal Uncle 24  . Stroke Neg Hx   . Esophageal cancer Neg Hx   . Rectal cancer Neg Hx   . Stomach cancer Neg Hx    Social History   Socioeconomic History  . Marital status: Married    Spouse name: Not on file  . Number of children: 2  . Years of education: Not on file  . Highest education level: Not on file  Occupational History  . Occupation: retired Camera operator  Social Needs  . Financial resource strain: Not hard at all  . Food insecurity    Worry: Never true    Inability: Never true  . Transportation needs    Medical: Yes    Non-medical: Yes  Tobacco Use  . Smoking status: Former Smoker    Years: 5.00    Types: Cigarettes    Quit date: 04/30/1962    Years since quitting: 56.9  . Smokeless tobacco: Never Used  . Tobacco comment: smoked 1956-1964, up to 1/2 ppd  Substance and Sexual Activity  . Alcohol use: Yes    Alcohol/week: 7.0 - 14.0 standard drinks    Types: 7 - 14 Glasses of  per week    Comment: 10-29 about 10 glasses of  weekly   . Drug use: No  . Sexual activity: Yes  Lifestyle  . Physical activity    Days per week: 4 days    Minutes per session: 40 min  . Stress: Not at all  Relationships  . Social connections    Talks on phone: More than three times a week    Gets together: More than three times a week    Attends religious service: 1 to 4 times per year    Active member of club or organization: Yes    Attends meetings of clubs or organizations: 1 to 4 times per year    Relationship status: Married  Other  Topics Concern  . Not on file  Social History Narrative   Married,     Outpatient Encounter Medications as of 03/31/2019  Medication Sig  . cholecalciferol (VITAMIN D3) 25 MCG (1000 UT) tablet Take 1,000 Units by mouth daily.  . Cyanocobalamin (B-12) 5000 MCG CAPS Take 1 tablet by mouth daily.  Marland Kitchen  estradiol (ESTRACE) 0.1 MG/GM vaginal cream Insert 1 applicator full 2 times weekly. Need office visit for more refills.  Marland Kitchen FINACEA 15 % cream Apply 1 application topically daily.  . folic acid (FOLVITE) 1 MG tablet TAKE 1 TABLET(1 MG) BY MOUTH DAILY  . Glucosamine-Chondroit-Vit C-Mn (GLUCOSAMINE CHONDR 1500 COMPLX) CAPS Take 1 capsule by mouth daily.  Marland Kitchen imatinib (GLEEVEC) 100 MG tablet TAKE 3 TABLETS(300 MG) BY MOUTH DAILY (Patient taking differently: Take 300 mg by mouth daily. )  . Loperamide HCl (IMODIUM PO) Take 0.5-1 tablets by mouth 2 (two) times daily as needed (takes 0.5 tablet every morning and 2nd dose if needed for diarrhea).   . metoprolol tartrate (LOPRESSOR) 25 MG tablet Take 1 tablet (25 mg total) by mouth 2 (two) times daily.  . Multiple Vitamin (MULTIVITAMIN) tablet Take 1 tablet by mouth daily.  . Multiple Vitamins-Minerals (PRESERVISION AREDS 2) CAPS Take 1 capsule by mouth 2 (two) times a day.  . Probiotic Product (ALIGN) 4 MG CAPS Take 1 capsule by mouth every other day.   . traMADol (ULTRAM) 50 MG tablet Take 1-2 tablets (50-100 mg total) by mouth every 6 (six) hours as needed for moderate pain.  . [DISCONTINUED] HYDROcodone-acetaminophen (NORCO/VICODIN) 5-325 MG tablet Take 1-2 tablets by mouth every 6 (six) hours as needed for severe pain. (Patient not taking: Reported on 03/31/2019)  . [DISCONTINUED] methocarbamol (ROBAXIN) 500 MG tablet Take 1 tablet (500 mg total) by mouth every 6 (six) hours as needed for muscle spasms. (Patient not taking: Reported on 03/31/2019)   No facility-administered encounter medications on file as of 03/31/2019.     Activities of Daily Living In  your present state of health, do you have any difficulty performing the following activities: 03/31/2019 03/02/2019  Hearing? N N  Vision? N N  Difficulty concentrating or making decisions? N N  Walking or climbing stairs? N Y  Dressing or bathing? N N  Doing errands, shopping? N N  Preparing Food and eating ? N -  Using the Toilet? N -  In the past six months, have you accidently leaked urine? N -  Do you have problems with loss of bowel control? N -  Managing your Medications? N -  Managing your Finances? N -  Housekeeping or managing your Housekeeping? N -  Some recent data might be hidden    Patient Care Team: Binnie Rail, MD as PCP - General (Internal Medicine) Minus Breeding, MD as PCP - Cardiology (Cardiology) Gaynelle Arabian, MD as Consulting Physician (Orthopedic Surgery) Heath Lark, MD as Consulting Physician (Hematology and Oncology)    Assessment:   This is a routine wellness examination for [place]. Physical assessment deferred to PCP.  Exercise Activities and Dietary recommendations Current Exercise Habits: Home exercise routine, Type of exercise: walking, Time (Minutes): 30, Frequency (Times/Week): 5, Weekly Exercise (Minutes/Week): 150, Intensity: Moderate, Exercise limited by: orthopedic condition(s)  Diet (meal preparation, eat out, water intake, caffeinated beverages, dairy products, fruits and vegetables): in general, a "healthy" diet  , well balanced   Reviewed heart healthy diet. Encouraged patient to increase daily water and healthy fluid intake.  Goals    . Patient Stated     Continue to gradually increase the amount that I walk routinely until I reach the distance I was walking prior to having hip surgery.        Fall Risk Fall Risk  03/31/2019 02/26/2017 02/17/2016 08/06/2014 02/18/2014  Falls in the past year? 1 No No No No  Number  falls in past yr: 0 - - - -  Injury with Fall? 0 - - - -  Risk for fall due to : History of fall(s) - - - -  Follow  up Falls prevention discussed - - - -   Is the patient's home free of loose throw rugs in walkways, pet beds, electrical cords, etc?   yes      Grab bars in the bathroom? yes      Handrails on the stairs?   yes      Adequate lighting?   yes  Depression Screen PHQ 2/9 Scores 03/31/2019 02/26/2017 02/17/2016 08/06/2014  PHQ - 2 Score 0 0 0 0     Cognitive Function       Ad8 score reviewed for issues:  Issues making decisions: no  Less interest in hobbies / activities: no  Repeats questions, stories (family complaining): no  Trouble using ordinary gadgets (microwave, computer, phone):no  Forgets the month or year: no  Mismanaging finances: no  Remembering appts: no  Daily problems with thinking and/or memory: no Ad8 score is= 0  Immunization History  Administered Date(s) Administered  . Fluad Quad(high Dose 65+) 02/06/2019  . Influenza Split 02/17/2015  . Influenza Whole 02/29/2012  . Influenza, High Dose Seasonal PF 02/17/2016  . Influenza,inj,Quad PF,6+ Mos 04/15/2018  . Influenza,inj,quad, With Preservative 01/28/2017  . Influenza-Unspecified 03/14/2013, 01/19/2014, 01/17/2017  . Pneumococcal Conjugate-13 03/01/2015  . Pneumococcal Polysaccharide-23 03/30/2002  . Td 04/30/2006   Screening Tests Health Maintenance  Topic Date Due  . TETANUS/TDAP  04/30/2016  . DEXA SCAN  02/19/2020  . PNA vac Low Risk Adult  Completed       Plan:   Reviewed health maintenance screenings with patient today and relevant education, vaccines, and/or referrals were provided.   I have personally reviewed and noted the following in the patient's chart:   . Medical and social history . Use of alcohol, tobacco or illicit drugs  . Current medications and supplements . Functional ability and status . Nutritional status . Physical activity . Advanced directives . List of other physicians . Vitals . Screenings to include cognitive, depression, and falls . Referrals and  appointments  In addition, I have reviewed and discussed with patient certain preventive protocols, quality metrics, and best practice recommendations. A written personalized care plan for preventive services as well as general preventive health recommendations were provided to patient.     Michiel Cowboy, RN  03/31/2019   Medical screening examination/treatment/procedure(s) were performed by non-physician practitioner and as supervising physician I was immediately available for consultation/collaboration. I agree with above. Binnie Rail, MD

## 2019-03-31 ENCOUNTER — Other Ambulatory Visit: Payer: Self-pay

## 2019-03-31 ENCOUNTER — Ambulatory Visit (INDEPENDENT_AMBULATORY_CARE_PROVIDER_SITE_OTHER): Payer: Medicare Other | Admitting: *Deleted

## 2019-03-31 VITALS — BP 108/56 | HR 58 | Resp 16 | Ht 62.0 in | Wt 116.0 lb

## 2019-03-31 DIAGNOSIS — Z0000 Encounter for general adult medical examination without abnormal findings: Secondary | ICD-10-CM

## 2019-03-31 NOTE — Patient Instructions (Addendum)
Continue doing brain stimulating activities (puzzles, reading, adult coloring books, staying active) to keep memory sharp.   Continue to eat heart healthy diet (full of fruits, vegetables, whole grains, lean protein, water--limit salt, fat, and sugar intake) and increase physical activity as tolerated.   [name] [name] , Thank you for taking time to come for your Medicare Wellness Visit. I appreciate your ongoing commitment to your health goals. Please review the following plan we discussed and let me know if I can assist you in the future.   These are the goals we discussed: Goals    . Patient Stated     Continue to gradually increase the amount that I walk routinely until I reach the distance I was walking prior to having hip surgery.        This is a list of the screening recommended for you and due dates:  Health Maintenance  Topic Date Due  . Tetanus Vaccine  04/30/2016  . DEXA scan (bone density measurement)  02/19/2020  . Pneumonia vaccines  Completed    Preventive Care 82 Years and Older, Female Preventive care refers to lifestyle choices and visits with your health care provider that can promote health and wellness. This includes:  A yearly physical exam. This is also called an annual well check.  Regular dental and eye exams.  Immunizations.  Screening for certain conditions.  Healthy lifestyle choices, such as diet and exercise. What can I expect for my preventive care visit? Physical exam Your health care provider will check:  Height and weight. These may be used to calculate body mass index (BMI), which is a measurement that tells if you are at a healthy weight.  Heart rate and blood pressure.  Your skin for abnormal spots. Counseling Your health care provider may ask you questions about:  Alcohol, tobacco, and drug use.  Emotional well-being.  Home and relationship well-being.  Sexual activity.  Eating habits.  History of falls.  Memory and  ability to understand (cognition).  Work and work Statistician.  Pregnancy and menstrual history. What immunizations do I need?  Influenza (flu) vaccine  This is recommended every year. Tetanus, diphtheria, and pertussis (Tdap) vaccine  You may need a Td booster every 10 years. Varicella (chickenpox) vaccine  You may need this vaccine if you have not already been vaccinated. Zoster (shingles) vaccine  You may need this after age 12. Pneumococcal conjugate (PCV13) vaccine  One dose is recommended after age 21. Pneumococcal polysaccharide (PPSV23) vaccine  One dose is recommended after age 8. Measles, mumps, and rubella (MMR) vaccine  You may need at least one dose of MMR if you were born in 1957 or later. You may also need a second dose. Meningococcal conjugate (MenACWY) vaccine  You may need this if you have certain conditions. Hepatitis A vaccine  You may need this if you have certain conditions or if you travel or work in places where you may be exposed to hepatitis A. Hepatitis B vaccine  You may need this if you have certain conditions or if you travel or work in places where you may be exposed to hepatitis B. Haemophilus influenzae type b (Hib) vaccine  You may need this if you have certain conditions. You may receive vaccines as individual doses or as more than one vaccine together in one shot (combination vaccines). Talk with your health care provider about the risks and benefits of combination vaccines. What tests do I need? Blood tests  Lipid and cholesterol levels. These  may be checked every 5 years, or more frequently depending on your overall health.  Hepatitis C test.  Hepatitis B test. Screening  Lung cancer screening. You may have this screening every year starting at age 82 if you have a 30-pack-year history of smoking and currently smoke or have quit within the past 15 years.  Colorectal cancer screening. All adults should have this screening  starting at age 82 and continuing until age 16. Your health care provider may recommend screening at age 82 if you are at increased risk. You will have tests every 1-10 years, depending on your results and the type of screening test.  Diabetes screening. This is done by checking your blood sugar (glucose) after you have not eaten for a while (fasting). You may have this done every 1-3 years.  Mammogram. This may be done every 1-2 years. Talk with your health care provider about how often you should have regular mammograms.  BRCA-related cancer screening. This may be done if you have a family history of breast, ovarian, tubal, or peritoneal cancers. Other tests  Sexually transmitted disease (STD) testing.  Bone density scan. This is done to screen for osteoporosis. You may have this done starting at age 19. Follow these instructions at home: Eating and drinking  Eat a diet that includes fresh fruits and vegetables, whole grains, lean protein, and low-fat dairy products. Limit your intake of foods with high amounts of sugar, saturated fats, and salt.  Take vitamin and mineral supplements as recommended by your health care provider.  Do not drink alcohol if your health care provider tells you not to drink.  If you drink alcohol: ? Limit how much you have to 0-1 drink a day. ? Be aware of how much alcohol is in your drink. In the [place] one drink equals one 12 oz bottle of beer (355 mL), one 5 oz glass of wine (148 mL), or one 1 oz glass of hard liquor (44 mL). Lifestyle  Take daily care of your teeth and gums.  Stay active. Exercise for at least 30 minutes on 5 or more days each week.  Do not use any products that contain nicotine or tobacco, such as cigarettes, e-cigarettes, and chewing tobacco. If you need help quitting, ask your health care provider.  If you are sexually active, practice safe sex. Use a condom or other form of protection in order to prevent STIs (sexually transmitted  infections).  Talk with your health care provider about taking a low-dose aspirin or statin. What's next?  Go to your health care provider once a year for a well check visit.  Ask your health care provider how often you should have your eyes and teeth checked.  Stay up to date on all vaccines. This information is not intended to replace advice given to you by your health care provider. Make sure you discuss any questions you have with your health care provider. Document Released: 05/13/2015 Document Revised: 04/10/2018 Document Reviewed: 04/10/2018 Elsevier Patient Education  2020 [employer].

## 2019-05-04 ENCOUNTER — Other Ambulatory Visit: Payer: Self-pay

## 2019-05-04 ENCOUNTER — Inpatient Hospital Stay: Payer: Medicare PPO | Attending: Hematology and Oncology

## 2019-05-04 DIAGNOSIS — Z79899 Other long term (current) drug therapy: Secondary | ICD-10-CM | POA: Diagnosis not present

## 2019-05-04 DIAGNOSIS — T451X5A Adverse effect of antineoplastic and immunosuppressive drugs, initial encounter: Secondary | ICD-10-CM | POA: Insufficient documentation

## 2019-05-04 DIAGNOSIS — D61810 Antineoplastic chemotherapy induced pancytopenia: Secondary | ICD-10-CM | POA: Diagnosis not present

## 2019-05-04 DIAGNOSIS — C9210 Chronic myeloid leukemia, BCR/ABL-positive, not having achieved remission: Secondary | ICD-10-CM | POA: Diagnosis not present

## 2019-05-04 LAB — CMP (CANCER CENTER ONLY)
ALT: 19 U/L (ref 0–44)
AST: 34 U/L (ref 15–41)
Albumin: 3.9 g/dL (ref 3.5–5.0)
Alkaline Phosphatase: 56 U/L (ref 38–126)
Anion gap: 11 (ref 5–15)
BUN: 21 mg/dL (ref 8–23)
CO2: 27 mmol/L (ref 22–32)
Calcium: 9.2 mg/dL (ref 8.9–10.3)
Chloride: 101 mmol/L (ref 98–111)
Creatinine: 0.93 mg/dL (ref 0.44–1.00)
GFR, Est AFR Am: >60 mL/min (ref >60)
GFR, Estimated: 57 mL/min — ABNORMAL LOW (ref >60)
Glucose, Bld: 104 mg/dL — ABNORMAL HIGH (ref 70–99)
Potassium: 4.2 mmol/L (ref 3.5–5.1)
Sodium: 139 mmol/L (ref 135–145)
Total Bilirubin: 0.9 mg/dL (ref 0.3–1.2)
Total Protein: 6.3 g/dL — ABNORMAL LOW (ref 6.5–8.1)

## 2019-05-04 LAB — CBC WITH DIFFERENTIAL/PLATELET
Abs Immature Granulocytes: 0.01 10*3/uL (ref 0.00–0.07)
Basophils Absolute: 0 10*3/uL (ref 0.0–0.1)
Basophils Relative: 1 %
Eosinophils Absolute: 0.1 10*3/uL (ref 0.0–0.5)
Eosinophils Relative: 3 %
HCT: 30.3 % — ABNORMAL LOW (ref 36.0–46.0)
Hemoglobin: 10.1 g/dL — ABNORMAL LOW (ref 12.0–15.0)
Immature Granulocytes: 0 %
Lymphocytes Relative: 28 %
Lymphs Abs: 1.1 10*3/uL (ref 0.7–4.0)
MCH: 36.9 pg — ABNORMAL HIGH (ref 26.0–34.0)
MCHC: 33.3 g/dL (ref 30.0–36.0)
MCV: 110.6 fL — ABNORMAL HIGH (ref 80.0–100.0)
Monocytes Absolute: 0.5 10*3/uL (ref 0.1–1.0)
Monocytes Relative: 14 %
Neutro Abs: 2.2 10*3/uL (ref 1.7–7.7)
Neutrophils Relative %: 54 %
Platelets: 129 10*3/uL — ABNORMAL LOW (ref 150–400)
RBC: 2.74 MIL/uL — ABNORMAL LOW (ref 3.87–5.11)
RDW: 17.3 % — ABNORMAL HIGH (ref 11.5–15.5)
WBC: 4 10*3/uL (ref 4.0–10.5)
nRBC: 0 % (ref 0.0–0.2)

## 2019-05-11 DIAGNOSIS — H01021 Squamous blepharitis right upper eyelid: Secondary | ICD-10-CM | POA: Diagnosis not present

## 2019-05-11 DIAGNOSIS — H04123 Dry eye syndrome of bilateral lacrimal glands: Secondary | ICD-10-CM | POA: Diagnosis not present

## 2019-05-11 DIAGNOSIS — H01024 Squamous blepharitis left upper eyelid: Secondary | ICD-10-CM | POA: Diagnosis not present

## 2019-05-13 LAB — BCR/ABL

## 2019-05-14 ENCOUNTER — Telehealth: Payer: Self-pay | Admitting: Hematology and Oncology

## 2019-05-14 ENCOUNTER — Encounter: Payer: Self-pay | Admitting: Hematology and Oncology

## 2019-05-14 ENCOUNTER — Other Ambulatory Visit: Payer: Self-pay

## 2019-05-14 ENCOUNTER — Inpatient Hospital Stay: Payer: Medicare PPO | Admitting: Hematology and Oncology

## 2019-05-14 DIAGNOSIS — C9210 Chronic myeloid leukemia, BCR/ABL-positive, not having achieved remission: Secondary | ICD-10-CM | POA: Diagnosis not present

## 2019-05-14 DIAGNOSIS — D61810 Antineoplastic chemotherapy induced pancytopenia: Secondary | ICD-10-CM | POA: Diagnosis not present

## 2019-05-14 DIAGNOSIS — M25552 Pain in left hip: Secondary | ICD-10-CM | POA: Diagnosis not present

## 2019-05-14 DIAGNOSIS — T451X5A Adverse effect of antineoplastic and immunosuppressive drugs, initial encounter: Secondary | ICD-10-CM

## 2019-05-14 DIAGNOSIS — Z299 Encounter for prophylactic measures, unspecified: Secondary | ICD-10-CM

## 2019-05-14 DIAGNOSIS — Z79899 Other long term (current) drug therapy: Secondary | ICD-10-CM | POA: Diagnosis not present

## 2019-05-14 NOTE — Assessment & Plan Note (Signed)
She has stable pancytopenia Serum vitamin B12 and thyroid function were within normal limits She will continue Gleevec as prescribed 

## 2019-05-14 NOTE — Assessment & Plan Note (Signed)
She had recent hip surgery with resolution of her hip pain She is able to ambulate well

## 2019-05-14 NOTE — Telephone Encounter (Signed)
Scheduled per 1/14 sch msg. Called and spoke with pt, confirmed 4/5 and 4/15 appts

## 2019-05-14 NOTE — Assessment & Plan Note (Signed)
Her blood count is stable She has mild pancytopenia with treatment but she is not symptomatic BCR/ABL is detected but she remained in major molecular response We will continue treatment indefinitely I will schedule return appointment in 3 months

## 2019-05-14 NOTE — Assessment & Plan Note (Signed)
We discussed the paucity of data on patients with malignant hematology receiving chemotherapy and efficacy of Covid vaccination At this point in time, there is no contraindication for her to proceed

## 2019-05-14 NOTE — Progress Notes (Signed)
[place] [place] OFFICE PROGRESS NOTE  Patient Care Team: Binnie Rail, MD as PCP - General (Internal Medicine) Minus Breeding, MD as PCP - Cardiology (Cardiology) Gaynelle Arabian, MD as Consulting Physician (Orthopedic Surgery) Heath Lark, MD as Consulting Physician (Hematology and Oncology)  ASSESSMENT & PLAN:  Chronic myeloid leukemia Her blood count is stable She has mild pancytopenia with treatment but she is not symptomatic BCR/ABL is detected but she remained in major molecular response We will continue treatment indefinitely I will schedule return appointment in 3 months  Pancytopenia due to antineoplastic chemotherapy [facility]) She has stable pancytopenia Serum vitamin B12 and thyroid function were within normal limits She will continue Gleevec as prescribed  Left hip pain She had recent hip surgery with resolution of her hip pain She is able to ambulate well  Preventive measure We discussed the paucity of data on patients with malignant hematology receiving chemotherapy and efficacy of Covid vaccination At this point in time, there is no contraindication for her to proceed   No orders of the defined types were placed in this encounter.   All questions were answered. The patient knows to call the clinic with any problems, questions or concerns. The total time spent in the appointment was 20 minutes encounter with patients including review of chart and various tests results, discussions about plan of care and coordination of care plan   Heath Lark, MD 05/14/2019 10:27 AM  INTERVAL HISTORY: Please see below for problem oriented charting. She returns for follow-up of her diagnosis of CML and treatment with [place] She shared with me that her insurance has recently changed and now she is stuck with a higher co-pay than usual She denies reaction to treatment such as shortness of breath or recurrent infection Her appetite is stable She had recent hip surgery  with resolution of her hip pain Her mobility has improved dramatically  SUMMARY OF ONCOLOGIC HISTORY: Oncology History  Chronic myeloid leukemia ([place])  02/18/2007 Initial Diagnosis   Chronic myeloid leukemia   08/20/2014 Tumor Marker   BCR/ABL is undetectable   03/04/2015 Pathology Results   BCR/ABL is undetectable   08/29/2015 Tumor Marker   BCR/ABL is undetectable   02/29/2016 Pathology Results   BCR/ABL is undetectable   05/01/2016 Pathology Results   BCR/ABL b2a2 is detected at 0.03% and IS ratio 0.069%   05/31/2016 Pathology Results   BCR/ABL b2a2 is detected at 0.12% and IS ratio 0.276%   06/19/2016 Pathology Results   BCR/ABL b2a2 is detected at 0.24% and IS ratio 0.552%   06/19/2016 Miscellaneous   ABL mutation kinase testing is negative   06/27/2016 Miscellaneous   Dose of Gleevec is increased to 600 mg daily   07/25/2016 Pathology Results   BCR/ABL b2a2 is detected at 0.03% and IS ratio 0.069%. She has achieved MMR   08/30/2016 Miscellaneous   Dose of Gleevec is reduced to 300 mg daily and subsequently down to 200 mg daily   10/22/2016 Pathology Results   BCR/ABL b2a2 is not detected. She has achieved MMR   01/03/2017 Pathology Results   BCR/ABL b2a2 is detected IS ratio 0.041%   02/18/2017 Pathology Results   BCR/ABL b2a2 is detected IS ratio 0.0126   05/21/2017 Pathology Results   BCR/ABL b2a2 is not detected. She has achieved MMR   08/20/2017 Pathology Results   BCR/ABL b2a2 is not detected. She has achieved MMR   11/12/2017 Pathology Results   BCR/ABL b2a2 is not detected   04/07/2018 Pathology Results  BCR/ABL b2a2 is detected IS ratio 0.0062. She is in MMR   06/27/2018 Pathology Results   BCR/ABL b2a2 is detected IS ratio 0.0097%. She is in MMR   08/29/2018 Pathology Results   BCR/ABL is undetectable. She is in MMR   12/26/2018 Pathology Results   BCR/ABL b2a2 is detected IS ratio 0.0042%. She is in MMR   05/04/2019 Pathology Results   BCR/ABL b2a2 is  detected IS ratio 0.0575%. She is in MMR     REVIEW OF SYSTEMS:   Constitutional: Denies fevers, chills or abnormal weight loss Eyes: Denies blurriness of vision Ears, nose, mouth, throat, and face: Denies mucositis or sore throat Respiratory: Denies cough, dyspnea or wheezes Cardiovascular: Denies palpitation, chest discomfort or lower extremity swelling Gastrointestinal:  Denies nausea, heartburn or change in bowel habits Skin: Denies abnormal skin rashes Lymphatics: Denies new lymphadenopathy or easy bruising Neurological:Denies numbness, tingling or new weaknesses Behavioral/Psych: Mood is stable, no new changes  All other systems were reviewed with the patient and are negative.  I have reviewed the past medical history, past surgical history, social history and family history with the patient and they are unchanged from previous note.  ALLERGIES:  is allergic to penicillins.  MEDICATIONS:  Current Outpatient Medications  Medication Sig Dispense Refill  . cholecalciferol (VITAMIN D3) 25 MCG (1000 UT) tablet Take 1,000 Units by mouth daily.    . Cyanocobalamin (B-12) 5000 MCG CAPS Take 1 tablet by mouth daily.    Marland Kitchen estradiol (ESTRACE) 0.1 MG/GM vaginal cream Insert 1 applicator full 2 times weekly. Need office visit for more refills. 42.5 g 0  . FINACEA 15 % cream Apply 1 application topically daily.  2  . folic acid (FOLVITE) 1 MG tablet TAKE 1 TABLET(1 MG) BY MOUTH DAILY 30 tablet 3  . Glucosamine-Chondroit-Vit C-Mn (GLUCOSAMINE CHONDR 1500 COMPLX) CAPS Take 1 capsule by mouth daily.    Marland Kitchen imatinib (GLEEVEC) 100 MG tablet TAKE 3 TABLETS(300 MG) BY MOUTH DAILY (Patient taking differently: Take 300 mg by mouth daily. ) 270 tablet 3  . Loperamide HCl (IMODIUM PO) Take 0.5-1 tablets by mouth 2 (two) times daily as needed (takes 0.5 tablet every morning and 2nd dose if needed for diarrhea).     . metoprolol tartrate (LOPRESSOR) 25 MG tablet Take 1 tablet (25 mg total) by mouth 2 (two)  times daily. 180 tablet 3  . Multiple Vitamin (MULTIVITAMIN) tablet Take 1 tablet by mouth daily.    . Multiple Vitamins-Minerals (PRESERVISION AREDS 2) CAPS Take 1 capsule by mouth 2 (two) times a day.    . Probiotic Product (ALIGN) 4 MG CAPS Take 1 capsule by mouth every other day.     . traMADol (ULTRAM) 50 MG tablet Take 1-2 tablets (50-100 mg total) by mouth every 6 (six) hours as needed for moderate pain. 40 tablet 0   No current facility-administered medications for this visit.    PHYSICAL EXAMINATION: ECOG PERFORMANCE STATUS: 1 - Symptomatic but completely ambulatory  Vitals:   05/14/19 1016  BP: (!) 106/55  Pulse: 73  Resp: 18  Temp: 98 F (36.7 C)  SpO2: 100%   Filed Weights   05/14/19 1016  Weight: 117 lb 3.2 oz (53.2 kg)    GENERAL:alert, no distress and comfortable SKIN: skin color, texture, turgor are normal, no rashes or significant lesions EYES: normal, Conjunctiva are pink and non-injected, sclera clear OROPHARYNX:no exudate, no erythema and lips, buccal mucosa, and tongue normal  NECK: supple, thyroid normal size, non-tender, without nodularity  LYMPH:  no palpable lymphadenopathy in the cervical, axillary or inguinal LUNGS: clear to auscultation and percussion with normal breathing effort HEART: regular rate & rhythm and no murmurs and no lower extremity edema ABDOMEN:abdomen soft, non-tender and normal bowel sounds Musculoskeletal:no cyanosis of digits and no clubbing  NEURO: alert & oriented x 3 with fluent speech, no focal motor/sensory deficits  LABORATORY DATA:  I have reviewed the data as listed    Component Value Date/Time   NA 139 05/04/2019 1108   NA 140 02/18/2017 0808   K 4.2 05/04/2019 1108   K 4.4 02/18/2017 0808   CL 101 05/04/2019 1108   CL 105 09/04/2012 1006   CO2 27 05/04/2019 1108   CO2 29 02/18/2017 0808   GLUCOSE 104 (H) 05/04/2019 1108   GLUCOSE 86 02/18/2017 0808   GLUCOSE 96 09/04/2012 1006   BUN 21 05/04/2019 1108   BUN  20.3 02/18/2017 0808   CREATININE 0.93 05/04/2019 1108   CREATININE 1.1 02/18/2017 0808   CALCIUM 9.2 05/04/2019 1108   CALCIUM 9.4 02/18/2017 0808   PROT 6.3 (L) 05/04/2019 1108   PROT 6.6 02/18/2017 0808   ALBUMIN 3.9 05/04/2019 1108   ALBUMIN 4.2 02/18/2017 0808   AST 34 05/04/2019 1108   AST 34 02/18/2017 0808   ALT 19 05/04/2019 1108   ALT 18 02/18/2017 0808   ALKPHOS 56 05/04/2019 1108   ALKPHOS 53 02/18/2017 0808   BILITOT 0.9 05/04/2019 1108   BILITOT 0.76 02/18/2017 0808   GFRNONAA 57 (L) 05/04/2019 1108   GFRAA >60 05/04/2019 1108    No results found for: SPEP, UPEP  Lab Results  Component Value Date   WBC 4.0 05/04/2019   NEUTROABS 2.2 05/04/2019   HGB 10.1 (L) 05/04/2019   HCT 30.3 (L) 05/04/2019   MCV 110.6 (H) 05/04/2019   PLT 129 (L) 05/04/2019      Chemistry      Component Value Date/Time   NA 139 05/04/2019 1108   NA 140 02/18/2017 0808   K 4.2 05/04/2019 1108   K 4.4 02/18/2017 0808   CL 101 05/04/2019 1108   CL 105 09/04/2012 1006   CO2 27 05/04/2019 1108   CO2 29 02/18/2017 0808   BUN 21 05/04/2019 1108   BUN 20.3 02/18/2017 0808   CREATININE 0.93 05/04/2019 1108   CREATININE 1.1 02/18/2017 0808      Component Value Date/Time   CALCIUM 9.2 05/04/2019 1108   CALCIUM 9.4 02/18/2017 0808   ALKPHOS 56 05/04/2019 1108   ALKPHOS 53 02/18/2017 0808   AST 34 05/04/2019 1108   AST 34 02/18/2017 0808   ALT 19 05/04/2019 1108   ALT 18 02/18/2017 0808   BILITOT 0.9 05/04/2019 1108   BILITOT 0.76 02/18/2017 1610

## 2019-06-07 ENCOUNTER — Ambulatory Visit: Payer: Medicare PPO | Attending: Internal Medicine

## 2019-06-07 DIAGNOSIS — Z23 Encounter for immunization: Secondary | ICD-10-CM | POA: Insufficient documentation

## 2019-06-07 NOTE — Progress Notes (Signed)
   Covid-19 Vaccination Clinic  Name:  [name] [name]    MRN: [id number] DOB: Dec 07, 1934  06/07/2019  [name] was observed post Covid-19 immunization for 15 minutes without incidence. She was provided with Vaccine Information Sheet and instruction to access the V-Safe system.   [name] was instructed to call 911 with any severe reactions post vaccine: Marland Kitchen Difficulty breathing  . Swelling of your face and throat  . A fast heartbeat  . A bad rash all over your body  . Dizziness and weakness    Immunizations Administered    Name Date Dose VIS Date Route   Pfizer COVID-19 Vaccine 06/07/2019  4:03 PM 0.3 mL 04/10/2019 Intramuscular   Manufacturer: [place]   Lot: [id number]   [place]: [id number]

## 2019-06-17 DIAGNOSIS — H1132 Conjunctival hemorrhage, left eye: Secondary | ICD-10-CM | POA: Diagnosis not present

## 2019-06-17 DIAGNOSIS — H35363 Drusen (degenerative) of macula, bilateral: Secondary | ICD-10-CM | POA: Diagnosis not present

## 2019-06-17 DIAGNOSIS — H35033 Hypertensive retinopathy, bilateral: Secondary | ICD-10-CM | POA: Diagnosis not present

## 2019-06-17 DIAGNOSIS — H43813 Vitreous degeneration, bilateral: Secondary | ICD-10-CM | POA: Diagnosis not present

## 2019-06-17 DIAGNOSIS — H353132 Nonexudative age-related macular degeneration, bilateral, intermediate dry stage: Secondary | ICD-10-CM | POA: Diagnosis not present

## 2019-06-23 DIAGNOSIS — Z1231 Encounter for screening mammogram for malignant neoplasm of breast: Secondary | ICD-10-CM | POA: Diagnosis not present

## 2019-07-01 DIAGNOSIS — D1801 Hemangioma of skin and subcutaneous tissue: Secondary | ICD-10-CM | POA: Diagnosis not present

## 2019-07-01 DIAGNOSIS — L812 Freckles: Secondary | ICD-10-CM | POA: Diagnosis not present

## 2019-07-01 DIAGNOSIS — L821 Other seborrheic keratosis: Secondary | ICD-10-CM | POA: Diagnosis not present

## 2019-07-02 ENCOUNTER — Ambulatory Visit: Payer: Medicare PPO | Attending: Internal Medicine

## 2019-07-02 DIAGNOSIS — Z23 Encounter for immunization: Secondary | ICD-10-CM | POA: Insufficient documentation

## 2019-07-02 NOTE — Progress Notes (Signed)
   Covid-19 Vaccination Clinic  Name:  [name] [name]    MRN: [id number] DOB: 1935/01/01  07/02/2019  [name] was observed post Covid-19 immunization for 15 minutes without incident. She was provided with Vaccine Information Sheet and instruction to access the V-Safe system.   [name] was instructed to call 911 with any severe reactions post vaccine: Marland Kitchen Difficulty breathing  . Swelling of face and throat  . A fast heartbeat  . A bad rash all over body  . Dizziness and weakness   Immunizations Administered    Name Date Dose VIS Date Route   Pfizer COVID-19 Vaccine 07/02/2019 12:19 PM 0.3 mL 04/10/2019 Intramuscular   Manufacturer: [place]   Lot: [id number]   [place]: [id number]

## 2019-07-28 ENCOUNTER — Other Ambulatory Visit: Payer: Self-pay | Admitting: Internal Medicine

## 2019-07-28 ENCOUNTER — Telehealth: Payer: Self-pay | Admitting: Internal Medicine

## 2019-07-28 MED ORDER — FOLIC ACID 1 MG PO TABS: ORAL_TABLET | ORAL | 0 refills | Status: DC

## 2019-07-28 NOTE — Telephone Encounter (Signed)
Rx sent 

## 2019-07-28 NOTE — Telephone Encounter (Signed)
   1.Medication Requested: folic acid (FOLVITE) 1 MG tablet  2. Pharmacy (Name, Street, City):[facility] [place], [place] - [address] AT [address]  3. On Med List: yes  4. Last Visit with PCP: 02/06/19  5. Next visit date with PCP: declined to schedule next appt   Agent: Please be advised that RX refills may take up to 3 business days. We ask that you follow-up with your pharmacy.

## 2019-07-29 ENCOUNTER — Ambulatory Visit: Payer: Medicare PPO

## 2019-08-03 ENCOUNTER — Inpatient Hospital Stay: Payer: Medicare PPO | Attending: Hematology and Oncology

## 2019-08-03 ENCOUNTER — Telehealth: Payer: Self-pay | Admitting: Pharmacist

## 2019-08-03 ENCOUNTER — Other Ambulatory Visit: Payer: Self-pay

## 2019-08-03 DIAGNOSIS — C9210 Chronic myeloid leukemia, BCR/ABL-positive, not having achieved remission: Secondary | ICD-10-CM | POA: Insufficient documentation

## 2019-08-03 DIAGNOSIS — D61810 Antineoplastic chemotherapy induced pancytopenia: Secondary | ICD-10-CM | POA: Diagnosis not present

## 2019-08-03 DIAGNOSIS — N183 Chronic kidney disease, stage 3 unspecified: Secondary | ICD-10-CM | POA: Insufficient documentation

## 2019-08-03 DIAGNOSIS — Z79899 Other long term (current) drug therapy: Secondary | ICD-10-CM | POA: Insufficient documentation

## 2019-08-03 DIAGNOSIS — T451X5A Adverse effect of antineoplastic and immunosuppressive drugs, initial encounter: Secondary | ICD-10-CM | POA: Diagnosis not present

## 2019-08-03 LAB — COMPREHENSIVE METABOLIC PANEL
ALT: 24 U/L (ref 0–44)
AST: 35 U/L (ref 15–41)
Albumin: 4 g/dL (ref 3.5–5.0)
Alkaline Phosphatase: 66 U/L (ref 38–126)
Anion gap: 10 (ref 5–15)
BUN: 22 mg/dL (ref 8–23)
CO2: 27 mmol/L (ref 22–32)
Calcium: 9.1 mg/dL (ref 8.9–10.3)
Chloride: 104 mmol/L (ref 98–111)
Creatinine, Ser: 1.18 mg/dL — ABNORMAL HIGH (ref 0.44–1.00)
GFR calc Af Amer: 49 mL/min — ABNORMAL LOW (ref >60)
GFR calc non Af Amer: 43 mL/min — ABNORMAL LOW (ref >60)
Glucose, Bld: 95 mg/dL (ref 70–99)
Potassium: 4.4 mmol/L (ref 3.5–5.1)
Sodium: 141 mmol/L (ref 135–145)
Total Bilirubin: 0.6 mg/dL (ref 0.3–1.2)
Total Protein: 6.4 g/dL — ABNORMAL LOW (ref 6.5–8.1)

## 2019-08-03 LAB — CBC WITH DIFFERENTIAL/PLATELET
Abs Immature Granulocytes: 0.01 10*3/uL (ref 0.00–0.07)
Basophils Absolute: 0 10*3/uL (ref 0.0–0.1)
Basophils Relative: 1 %
Eosinophils Absolute: 0.1 10*3/uL (ref 0.0–0.5)
Eosinophils Relative: 2 %
HCT: 30.8 % — ABNORMAL LOW (ref 36.0–46.0)
Hemoglobin: 10.4 g/dL — ABNORMAL LOW (ref 12.0–15.0)
Immature Granulocytes: 0 %
Lymphocytes Relative: 29 %
Lymphs Abs: 1 10*3/uL (ref 0.7–4.0)
MCH: 38.2 pg — ABNORMAL HIGH (ref 26.0–34.0)
MCHC: 33.8 g/dL (ref 30.0–36.0)
MCV: 113.2 fL — ABNORMAL HIGH (ref 80.0–100.0)
Monocytes Absolute: 0.4 10*3/uL (ref 0.1–1.0)
Monocytes Relative: 12 %
Neutro Abs: 1.9 10*3/uL (ref 1.7–7.7)
Neutrophils Relative %: 56 %
Platelets: 125 10*3/uL — ABNORMAL LOW (ref 150–400)
RBC: 2.72 MIL/uL — ABNORMAL LOW (ref 3.87–5.11)
RDW: 13.4 % (ref 11.5–15.5)
WBC: 3.5 10*3/uL — ABNORMAL LOW (ref 4.0–10.5)
nRBC: 0 % (ref 0.0–0.2)

## 2019-08-03 NOTE — Telephone Encounter (Signed)
Oral Oncology Pharmacist Encounter   Prior Authorization for imatinib has been approved.     PA# [id number] Effective dates: 05/01/19 through 04/29/20  Darl Pikes, PharmD, BCPS. BCOP Hematology/Oncology Clinical Pharmacist [facility] Oral Chemotherapy Navigation [facility] [phone]  08/03/2019 1:44 PM

## 2019-08-13 ENCOUNTER — Inpatient Hospital Stay: Payer: Medicare PPO | Admitting: Hematology and Oncology

## 2019-08-13 ENCOUNTER — Other Ambulatory Visit: Payer: Self-pay

## 2019-08-13 ENCOUNTER — Encounter: Payer: Self-pay | Admitting: Hematology and Oncology

## 2019-08-13 DIAGNOSIS — N183 Chronic kidney disease, stage 3 unspecified: Secondary | ICD-10-CM | POA: Diagnosis not present

## 2019-08-13 DIAGNOSIS — T451X5A Adverse effect of antineoplastic and immunosuppressive drugs, initial encounter: Secondary | ICD-10-CM | POA: Diagnosis not present

## 2019-08-13 DIAGNOSIS — C9210 Chronic myeloid leukemia, BCR/ABL-positive, not having achieved remission: Secondary | ICD-10-CM | POA: Diagnosis not present

## 2019-08-13 DIAGNOSIS — D61810 Antineoplastic chemotherapy induced pancytopenia: Secondary | ICD-10-CM

## 2019-08-13 DIAGNOSIS — Z79899 Other long term (current) drug therapy: Secondary | ICD-10-CM | POA: Diagnosis not present

## 2019-08-13 LAB — BCR/ABL

## 2019-08-13 MED ORDER — IMATINIB MESYLATE 100 MG PO TABS: 300.0000 mg | ORAL_TABLET | Freq: Every day | ORAL | 11 refills | Status: DC

## 2019-08-13 NOTE — Assessment & Plan Note (Signed)
Her blood count is stable So far, she tolerated treatment well without recent diarrhea, fluid retention or shortness of breath She has mild pancytopenia with treatment but she is not symptomatic BCR/ABL is detected but she remained in major molecular response I recommend she consider taking her pills with fatty meals We will continue treatment indefinitely She will be in [place] in 3 months She will get her labs drawn locally I will schedule return appointment in 6 months

## 2019-08-13 NOTE — Assessment & Plan Note (Signed)
She has stable pancytopenia Serum vitamin B12 and thyroid function were within normal limits She will continue Gleevec as prescribed 

## 2019-08-13 NOTE — Assessment & Plan Note (Signed)
Her serum creatinine fluctuated a little bit She is not symptomatic Observe for now 

## 2019-08-13 NOTE — Progress Notes (Signed)
[place] OFFICE PROGRESS NOTE  Patient Care Team: Binnie Rail, MD as PCP - General (Internal Medicine) Minus Breeding, MD as PCP - Cardiology (Cardiology) Gaynelle Arabian, MD as Consulting Physician (Orthopedic Surgery) Heath Lark, MD as Consulting Physician (Hematology and Oncology)  ASSESSMENT & PLAN:  Chronic myeloid leukemia Her blood count is stable So far, she tolerated treatment well without recent diarrhea, fluid retention or shortness of breath She has mild pancytopenia with treatment but she is not symptomatic BCR/ABL is detected but she remained in major molecular response I recommend she consider taking her pills with fatty meals We will continue treatment indefinitely She will be in [place] in 3 months She will get her labs drawn locally I will schedule return appointment in 6 months  Pancytopenia due to antineoplastic chemotherapy [facility]) She has stable pancytopenia Serum vitamin B12 and thyroid function were within normal limits She will continue Gleevec as prescribed  CKD (chronic kidney disease), stage III Her serum creatinine fluctuated a little bit She is not symptomatic Observe for now   No orders of the defined types were placed in this encounter.   All questions were answered. The patient knows to call the clinic with any problems, questions or concerns. The total time spent in the appointment was 20 minutes encounter with patients including review of chart and various tests results, discussions about plan of care and coordination of care plan   Heath Lark, MD 08/13/2019 12:15 PM  INTERVAL HISTORY: Please see below for problem oriented charting. She returns with her husband for further follow-up She is doing well with treatment so far She denies missing doses She had issues with insurance company recently and they have denied her appeal to get 90-day supply She denies recent diarrhea, shortness of breath or fluid retention No  recent infection, fever or chills She has received Covid vaccination without difficulties The patient denies any recent signs or symptoms of bleeding such as spontaneous epistaxis, hematuria or hematochezia.   SUMMARY OF ONCOLOGIC HISTORY: Oncology History  Chronic myeloid leukemia ([name] [name])  02/18/2007 Initial Diagnosis   Chronic myeloid leukemia   08/20/2014 Tumor Marker   BCR/ABL is undetectable   03/04/2015 Pathology Results   BCR/ABL is undetectable   08/29/2015 Tumor Marker   BCR/ABL is undetectable   02/29/2016 Pathology Results   BCR/ABL is undetectable   05/01/2016 Pathology Results   BCR/ABL b2a2 is detected at 0.03% and IS ratio 0.069%   05/31/2016 Pathology Results   BCR/ABL b2a2 is detected at 0.12% and IS ratio 0.276%   06/19/2016 Pathology Results   BCR/ABL b2a2 is detected at 0.24% and IS ratio 0.552%   06/19/2016 Miscellaneous   ABL mutation kinase testing is negative   06/27/2016 Miscellaneous   Dose of Gleevec is increased to 600 mg daily   07/25/2016 Pathology Results   BCR/ABL b2a2 is detected at 0.03% and IS ratio 0.069%. She has achieved MMR   08/30/2016 Miscellaneous   Dose of Gleevec is reduced to 300 mg daily and subsequently down to 200 mg daily   10/22/2016 Pathology Results   BCR/ABL b2a2 is not detected. She has achieved MMR   01/03/2017 Pathology Results   BCR/ABL b2a2 is detected IS ratio 0.041%   02/18/2017 Pathology Results   BCR/ABL b2a2 is detected IS ratio 0.0126   05/21/2017 Pathology Results   BCR/ABL b2a2 is not detected. She has achieved MMR   08/20/2017 Pathology Results   BCR/ABL b2a2 is not detected. She has achieved MMR  11/12/2017 Pathology Results   BCR/ABL b2a2 is not detected   04/07/2018 Pathology Results   BCR/ABL b2a2 is detected IS ratio 0.0062. She is in MMR   06/27/2018 Pathology Results   BCR/ABL b2a2 is detected IS ratio 0.0097%. She is in MMR   08/29/2018 Pathology Results   BCR/ABL is undetectable. She is in MMR    12/26/2018 Pathology Results   BCR/ABL b2a2 is detected IS ratio 0.0042%. She is in MMR   05/04/2019 Pathology Results   BCR/ABL b2a2 is detected IS ratio 0.0575%. She is in MMR   08/03/2019 Pathology Results   BCR/ABL b2a2 is detected IS ratio 0.0167%. She is in MMR     REVIEW OF SYSTEMS:   Constitutional: Denies fevers, chills or abnormal weight loss Eyes: Denies blurriness of vision Ears, nose, mouth, throat, and face: Denies mucositis or sore throat Respiratory: Denies cough, dyspnea or wheezes Cardiovascular: Denies palpitation, chest discomfort or lower extremity swelling Gastrointestinal:  Denies nausea, heartburn or change in bowel habits Skin: Denies abnormal skin rashes Lymphatics: Denies new lymphadenopathy or easy bruising Neurological:Denies numbness, tingling or new weaknesses Behavioral/Psych: Mood is stable, no new changes  All other systems were reviewed with the patient and are negative.  I have reviewed the past medical history, past surgical history, social history and family history with the patient and they are unchanged from previous note.  ALLERGIES:  is allergic to penicillins.  MEDICATIONS:  Current Outpatient Medications  Medication Sig Dispense Refill  . cholecalciferol (VITAMIN D3) 25 MCG (1000 UT) tablet Take 1,000 Units by mouth daily.    . Cyanocobalamin (B-12) 5000 MCG CAPS Take 1 tablet by mouth daily.    Marland Kitchen estradiol (ESTRACE) 0.1 MG/GM vaginal cream Insert 1 applicator full 2 times weekly. Need office visit for more refills. 42.5 g 0  . FINACEA 15 % cream Apply 1 application topically daily.  2  . folic acid (FOLVITE) 1 MG tablet TAKE 1 TABLET(1 MG) BY MOUTH DAILY. Need office visit for more refills. 90 tablet 0  . Glucosamine-Chondroit-Vit C-Mn (GLUCOSAMINE CHONDR 1500 COMPLX) CAPS Take 1 capsule by mouth daily.    Marland Kitchen imatinib (GLEEVEC) 100 MG tablet Take 3 tablets (300 mg total) by mouth daily. Take with meals and large glass of  water.Caution:Chemotherapy 270 tablet 11  . Loperamide HCl (IMODIUM PO) Take 0.5-1 tablets by mouth 2 (two) times daily as needed (takes 0.5 tablet every morning and 2nd dose if needed for diarrhea).     . metoprolol tartrate (LOPRESSOR) 25 MG tablet Take 1 tablet (25 mg total) by mouth 2 (two) times daily. 180 tablet 3  . Multiple Vitamin (MULTIVITAMIN) tablet Take 1 tablet by mouth daily.    . Multiple Vitamins-Minerals (PRESERVISION AREDS 2) CAPS Take 1 capsule by mouth 2 (two) times a day.    . Probiotic Product (ALIGN) 4 MG CAPS Take 1 capsule by mouth every other day.     . traMADol (ULTRAM) 50 MG tablet Take 1-2 tablets (50-100 mg total) by mouth every 6 (six) hours as needed for moderate pain. 40 tablet 0   No current facility-administered medications for this visit.    PHYSICAL EXAMINATION: ECOG PERFORMANCE STATUS: 0 - Asymptomatic  Vitals:   08/13/19 1110  BP: (!) 114/58  Pulse: 66  Resp: 17  Temp: 97.8 F (36.6 C)  SpO2: 100%   Filed Weights   08/13/19 1110  Weight: 116 lb 6.4 oz (52.8 kg)    GENERAL:alert, no distress and comfortable SKIN: skin color,  texture, turgor are normal, no rashes or significant lesions EYES: normal, Conjunctiva are pink and non-injected, sclera clear OROPHARYNX:no exudate, no erythema and lips, buccal mucosa, and tongue normal  NECK: supple, thyroid normal size, non-tender, without nodularity LYMPH:  no palpable lymphadenopathy in the cervical, axillary or inguinal LUNGS: clear to auscultation and percussion with normal breathing effort HEART: regular rate & rhythm and no murmurs and no lower extremity edema ABDOMEN:abdomen soft, non-tender and normal bowel sounds Musculoskeletal:no cyanosis of digits and no clubbing  NEURO: alert & oriented x 3 with fluent speech, no focal motor/sensory deficits  LABORATORY DATA:  I have reviewed the data as listed    Component Value Date/Time   NA 141 08/03/2019 1029   NA 140 02/18/2017 0808   K  4.4 08/03/2019 1029   K 4.4 02/18/2017 0808   CL 104 08/03/2019 1029   CL 105 09/04/2012 1006   CO2 27 08/03/2019 1029   CO2 29 02/18/2017 0808   GLUCOSE 95 08/03/2019 1029   GLUCOSE 86 02/18/2017 0808   GLUCOSE 96 09/04/2012 1006   BUN 22 08/03/2019 1029   BUN 20.3 02/18/2017 0808   CREATININE 1.18 (H) 08/03/2019 1029   CREATININE 0.93 05/04/2019 1108   CREATININE 1.1 02/18/2017 0808   CALCIUM 9.1 08/03/2019 1029   CALCIUM 9.4 02/18/2017 0808   PROT 6.4 (L) 08/03/2019 1029   PROT 6.6 02/18/2017 0808   ALBUMIN 4.0 08/03/2019 1029   ALBUMIN 4.2 02/18/2017 0808   AST 35 08/03/2019 1029   AST 34 05/04/2019 1108   AST 34 02/18/2017 0808   ALT 24 08/03/2019 1029   ALT 19 05/04/2019 1108   ALT 18 02/18/2017 0808   ALKPHOS 66 08/03/2019 1029   ALKPHOS 53 02/18/2017 0808   BILITOT 0.6 08/03/2019 1029   BILITOT 0.9 05/04/2019 1108   BILITOT 0.76 02/18/2017 0808   GFRNONAA 43 (L) 08/03/2019 1029   GFRNONAA 57 (L) 05/04/2019 1108   GFRAA 49 (L) 08/03/2019 1029   GFRAA >60 05/04/2019 1108    No results found for: SPEP, UPEP  Lab Results  Component Value Date   WBC 3.5 (L) 08/03/2019   NEUTROABS 1.9 08/03/2019   HGB 10.4 (L) 08/03/2019   HCT 30.8 (L) 08/03/2019   MCV 113.2 (H) 08/03/2019   PLT 125 (L) 08/03/2019      Chemistry      Component Value Date/Time   NA 141 08/03/2019 1029   NA 140 02/18/2017 0808   K 4.4 08/03/2019 1029   K 4.4 02/18/2017 0808   CL 104 08/03/2019 1029   CL 105 09/04/2012 1006   CO2 27 08/03/2019 1029   CO2 29 02/18/2017 0808   BUN 22 08/03/2019 1029   BUN 20.3 02/18/2017 0808   CREATININE 1.18 (H) 08/03/2019 1029   CREATININE 0.93 05/04/2019 1108   CREATININE 1.1 02/18/2017 0808      Component Value Date/Time   CALCIUM 9.1 08/03/2019 1029   CALCIUM 9.4 02/18/2017 0808   ALKPHOS 66 08/03/2019 1029   ALKPHOS 53 02/18/2017 0808   AST 35 08/03/2019 1029   AST 34 05/04/2019 1108   AST 34 02/18/2017 0808   ALT 24 08/03/2019 1029   ALT  19 05/04/2019 1108   ALT 18 02/18/2017 0808   BILITOT 0.6 08/03/2019 1029   BILITOT 0.9 05/04/2019 1108   BILITOT 0.76 02/18/2017 3818

## 2019-08-14 ENCOUNTER — Telehealth: Payer: Self-pay | Admitting: Hematology and Oncology

## 2019-08-14 NOTE — Telephone Encounter (Signed)
Scheduled per 04/15 scheduled message, patient has been called and notified. 

## 2019-08-24 DIAGNOSIS — Z96642 Presence of left artificial hip joint: Secondary | ICD-10-CM | POA: Insufficient documentation

## 2019-08-24 NOTE — Patient Instructions (Addendum)
Medications reviewed and updated.  Changes include :  none   Your prescription(s) have been submitted to your pharmacy. Please take as directed and contact our office if you believe you are having problem(s) with the medication(s).     Please followup in 1 year    Health Maintenance, Female Adopting a healthy lifestyle and getting preventive care are important in promoting health and wellness. Ask your health care provider about:  The right schedule for you to have regular tests and exams.  Things you can do on your own to prevent diseases and keep yourself healthy. What should I know about diet, weight, and exercise? Eat a healthy diet   Eat a diet that includes plenty of vegetables, fruits, low-fat dairy products, and lean protein.  Do not eat a lot of foods that are high in solid fats, added sugars, or sodium. Maintain a healthy weight Body mass index (BMI) is used to identify weight problems. It estimates body fat based on height and weight. Your health care provider can help determine your BMI and help you achieve or maintain a healthy weight. Get regular exercise Get regular exercise. This is one of the most important things you can do for your health. Most adults should:  Exercise for at least 150 minutes each week. The exercise should increase your heart rate and make you sweat (moderate-intensity exercise).  Do strengthening exercises at least twice a week. This is in addition to the moderate-intensity exercise.  Spend less time sitting. Even light physical activity can be beneficial. Watch cholesterol and blood lipids Have your blood tested for lipids and cholesterol at 83 years of age, then have this test every 5 years. Have your cholesterol levels checked more often if:  Your lipid or cholesterol levels are high.  You are older than 83 years of age.  You are at high risk for heart disease. What should I know about cancer screening? Depending on your  health history and family history, you may need to have cancer screening at various ages. This may include screening for:  Breast cancer.  Cervical cancer.  Colorectal cancer.  Skin cancer.  Lung cancer. What should I know about heart disease, diabetes, and high blood pressure? Blood pressure and heart disease  High blood pressure causes heart disease and increases the risk of stroke. This is more likely to develop in people who have high blood pressure readings, are of African descent, or are overweight.  Have your blood pressure checked: ? Every 3-5 years if you are 83-60 years of age. ? Every year if you are 83 years old or older. Diabetes Have regular diabetes screenings. This checks your fasting blood sugar level. Have the screening done:  Once every three years after age 33 if you are at a normal weight and have a low risk for diabetes.  More often and at a younger age if you are overweight or have a high risk for diabetes. What should I know about preventing infection? Hepatitis B If you have a higher risk for hepatitis B, you should be screened for this virus. Talk with your health care provider to find out if you are at risk for hepatitis B infection. Hepatitis C Testing is recommended for:  Everyone born from [birth year] through 1963.  Anyone with known risk factors for hepatitis C. Sexually transmitted infections (STIs)  Get screened for STIs, including gonorrhea and chlamydia, if: ? You are sexually active and are younger than 83 years of age. ?  You are older than 83 years of age and your health care provider tells you that you are at risk for this type of infection. ? Your sexual activity has changed since you were last screened, and you are at increased risk for chlamydia or gonorrhea. Ask your health care provider if you are at risk.  Ask your health care provider about whether you are at high risk for HIV. Your health care provider may recommend a prescription  medicine to help prevent HIV infection. If you choose to take medicine to prevent HIV, you should first get tested for HIV. You should then be tested every 3 months for as long as you are taking the medicine. Pregnancy  If you are about to stop having your period (premenopausal) and you may become pregnant, seek counseling before you get pregnant.  Take 400 to 800 micrograms (mcg) of folic acid every day if you become pregnant.  Ask for birth control (contraception) if you want to prevent pregnancy. Osteoporosis and menopause Osteoporosis is a disease in which the bones lose minerals and strength with aging. This can result in bone fractures. If you are 83 years old or older, or if you are at risk for osteoporosis and fractures, ask your health care provider if you should:  Be screened for bone loss.  Take a calcium or vitamin D supplement to lower your risk of fractures.  Be given hormone replacement therapy (HRT) to treat symptoms of menopause. Follow these instructions at home: Lifestyle  Do not use any products that contain nicotine or tobacco, such as cigarettes, e-cigarettes, and chewing tobacco. If you need help quitting, ask your health care provider.  Do not use street drugs.  Do not share needles.  Ask your health care provider for help if you need support or information about quitting drugs. Alcohol use  Do not drink alcohol if: ? Your health care provider tells you not to drink. ? You are pregnant, may be pregnant, or are planning to become pregnant.  If you drink alcohol: ? Limit how much you use to 0-1 drink a day. ? Limit intake if you are breastfeeding.  Be aware of how much alcohol is in your drink. In the [place] one drink equals one 12 oz bottle of beer (355 mL), one 5 oz glass of wine (148 mL), or one 1 oz glass of hard liquor (44 mL). General instructions  Schedule regular health, dental, and eye exams.  Stay current with your vaccines.  Tell your health  care provider if: ? You often feel depressed. ? You have ever been abused or do not feel safe at home. Summary  Adopting a healthy lifestyle and getting preventive care are important in promoting health and wellness.  Follow your health care provider's instructions about healthy diet, exercising, and getting tested or screened for diseases.  Follow your health care provider's instructions on monitoring your cholesterol and blood pressure. This information is not intended to replace advice given to you by your health care provider. Make sure you discuss any questions you have with your health care provider. Document Revised: 04/09/2018 Document Reviewed: 04/09/2018 Elsevier Patient Education  2020 [employer].

## 2019-08-24 NOTE — Progress Notes (Signed)
Subjective:    Patient ID: [name] [name], female    DOB: 1935-01-26, 85 y.o.   MRN: [id number]  HPI She is here for a physical exam.   She had her left hip replaced 03/2019.  She has recovered well from surgery and is happy she had it done.  She is back to her normal activity and walking regularly for exercise.  She has no major concerns.  Overall she feels well.  She has noticed a little swelling in her upper eyelids.  Medications and allergies reviewed with patient and updated if appropriate.  Patient Active Problem List   Diagnosis Date Noted  . History of left hip replacement, 03/2019 08/24/2019  . CKD (chronic kidney disease), stage III 08/13/2019  . Preventive measure 05/14/2019  . OA (osteoarthritis) of hip 03/02/2019  . Preop examination 02/06/2019  . Left hip pain 01/07/2019  . Iron deficiency anemia 11/21/2017  . Pain involving joint of finger of right hand 10/22/2017  . Vaginal atrophy 10/15/2017  . B12 deficiency 02/26/2017  . Bone lesion 09/18/2016  . Urinary incontinence 04/11/2016  . Vitamin D deficiency 10/02/2015  . Other fatigue 04/07/2015  . History of colonic polyps 08/06/2014  . Thrombocytopenia due to drugs 02/18/2014  . Leukopenia due to antineoplastic chemotherapy ([place]) 02/18/2014  . 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 History of breast cancer 07/18/2012  . Osteopenia 09/15/2011  . Diarrhea 08/29/2011  . Family history of malignant neoplasm of gastrointestinal tract 08/29/2011  . Hyperlipidemia 04/14/2009  . Pancytopenia due to antineoplastic chemotherapy ([place]) 04/14/2009  . DIVERTICULOSIS OF COLON 04/06/2008  . Chronic myeloid leukemia ([place]) 02/18/2007  . ROSACEA 02/18/2007    Current Outpatient Medications on File Prior to Visit  Medication Sig Dispense Refill  . cholecalciferol (VITAMIN D3) 25 MCG (1000 UT) tablet Take 1,000 Units by mouth daily.    . Cyanocobalamin (B-12) 5000 MCG CAPS Take 1 tablet by mouth daily.    Marland Kitchen estradiol (ESTRACE)  0.1 MG/GM vaginal cream Insert 1 applicator full 2 times weekly. Need office visit for more refills. 42.5 g 0  . FINACEA 15 % cream Apply 1 application topically daily.  2  . folic acid (FOLVITE) 1 MG tablet TAKE 1 TABLET(1 MG) BY MOUTH DAILY. Need office visit for more refills. 90 tablet 0  . Glucosamine-Chondroit-Vit C-Mn (GLUCOSAMINE CHONDR 1500 COMPLX) CAPS Take 1 capsule by mouth daily.    Marland Kitchen imatinib (GLEEVEC) 100 MG tablet Take 3 tablets (300 mg total) by mouth daily. Take with meals and large glass of water.Caution:Chemotherapy 270 tablet 11  . Loperamide HCl (IMODIUM PO) Take 0.5-1 tablets by mouth 2 (two) times daily as needed (takes 0.5 tablet every morning and 2nd dose if needed for diarrhea).     . metoprolol tartrate (LOPRESSOR) 25 MG tablet Take 1 tablet (25 mg total) by mouth 2 (two) times daily. 180 tablet 3  . Multiple Vitamin (MULTIVITAMIN) tablet Take 1 tablet by mouth daily.    . Multiple Vitamins-Minerals (PRESERVISION AREDS 2) CAPS Take 1 capsule by mouth 2 (two) times a day.    . Probiotic Product (ALIGN) 4 MG CAPS Take 1 capsule by mouth every other day.      No current facility-administered medications on file prior to visit.    Past Medical History:  Diagnosis Date  . Anemia requiring transfusions 2010 & 2015  . Arthritis    DJD  . Breast cancer ([place]) 1993   S/P lumpectomy , radiation, & Tamoxifen  . CML (  chronic myeloid leukemia) ([facility])    Dr Alvy Bimler;  . SVT (supraventricular tachycardia) [facility])    dx june 2020 , magd by Dr Percival Spanish ; reports occasional heart flutter    Past Surgical History:  Procedure Laterality Date  . ABDOMINAL HYSTERECTOMY  ~1989   no BSO, for Dysplasia   . APPENDECTOMY  ~1989  . BILATERAL SALPINGOOPHORECTOMY  2010   with bowel resection  . BREAST SURGERY     Right lumpectomy, s/p radiation 1993 and oral  Tamoxifen x 5 years [id number])   . CATARACT EXTRACTION  06/08/2014   left eye;Dr Herbert Deaner  . CATARACT EXTRACTION, BILATERAL  Bilateral 2015  . COLONOSCOPY  03/2003, last 2010   diverticulosis, Dr.Kaplan  . EXCISION MORTON'S NEUROMA Left mid 80's   foot  . gastrocslide     left leg; Dr Beola Cord  . ileostomy reversal  2011   3 mos after above  . KNEE SURGERY  mid 80's   Arthroscopy, right knee   . LEG TENDON SURGERY Left 2013   "cut on leg and fixed something"  . OPEN SURGICAL REPAIR OF GLUTEAL TENDON Left 10/24/2016   Procedure: Left hip bursectomy; gluteal tendon repair;  Surgeon: Gaynelle Arabian, MD;  Location: [facility];  Service: Orthopedics;  Laterality: Left;  . sigmoid colon resection  2010    Dr Arlyce Dice, [place] for fistula & diverticulitis  . TENDON REPAIR Left 2019   left shoulder tendon repair - surgical center   . TOTAL HIP ARTHROPLASTY Left 03/02/2019   Procedure: TOTAL HIP ARTHROPLASTY ANTERIOR APPROACH;  Surgeon: Gaynelle Arabian, MD;  Location: [facility];  Service: Orthopedics;  Laterality: Left;  121min  . TOTAL KNEE ARTHROPLASTY Right 06/11/2013   Procedure: RIGHT TOTAL KNEE ARTHROPLASTY;  Surgeon: Sydnee Cabal, MD;  Location: [facility];  Service: Orthopedics;  Laterality: Right;  . TRIGGER FINGER RELEASE Left 2013   thumb    Social History   Socioeconomic History  . Marital status: Married    Spouse name: Not on file  . Number of children: 2  . Years of education: Not on file  . Highest education level: Not on file  Occupational History  . Occupation: retired Camera operator  Tobacco Use  . Smoking status: Former Smoker    Years: 5.00    Types: Cigarettes    Quit date: 04/30/1962    Years since quitting: 57.3  . Smokeless tobacco: Never Used  . Tobacco comment: smoked 1956-1964, up to 1/2 ppd  Substance and Sexual Activity  . Alcohol use: Yes    Alcohol/week: 7.0 - 14.0 standard drinks    Types: 7 - 14 Glasses of wine per week    Comment: 10-29 about 10 glasses of wine weekly   . Drug use: No  . Sexual activity: Yes  Other Topics Concern  . Not on file  Social History Narrative    Married,    Social Determinants of Health   Financial Resource Strain: Low Risk   . Difficulty of Paying Living Expenses: Not hard at all  Food Insecurity: No Food Insecurity  . Worried About Charity fundraiser in the Last Year: Never true  . Ran Out of Food in the Last Year: Never true  Transportation Needs: Unmet Transportation Needs  . Lack of Transportation (Medical): Yes  . Lack of Transportation (Non-Medical): Yes  Physical Activity: Sufficiently Active  . Days of Exercise per Week: 4 days  . Minutes of Exercise per Session: 40 min  Stress: No Stress Concern Present  .  Feeling of Stress : Not at all  Social Connections: Not Isolated  . Frequency of Communication with Friends and Family: More than three times a week  . Frequency of Social Gatherings with Friends and Family: More than three times a week  . Attends Religious Services: 1 to 4 times per year  . Active Member of Clubs or Organizations: Yes  . Attends Archivist Meetings: 1 to 4 times per year  . Marital Status: Married    Family History  Problem Relation Age of Onset  . Colon cancer Mother 49  . Uterine cancer Sister   . Diabetes Cousin   . Cancer Father        Lip cancer  . Aneurysm Father        AAA;S/P surgery  . Breast cancer Paternal Aunt   . Heart attack Paternal Uncle 74  . Stroke Neg Hx   . Esophageal cancer Neg Hx   . Rectal cancer Neg Hx   . Stomach cancer Neg Hx     Review of Systems  Constitutional: Negative for fever.  HENT: Positive for rhinorrhea. Negative for congestion, sinus pressure and sinus pain.   Respiratory: Negative for cough, shortness of breath and wheezing.   Cardiovascular: Negative for chest pain, palpitations and leg swelling.  Gastrointestinal: Positive for diarrhea. Negative for abdominal pain, blood in stool, constipation and nausea.  Genitourinary: Negative for dysuria and hematuria.  Musculoskeletal: Positive for arthralgias.  Skin: Negative for rash.    Neurological: Negative for dizziness, light-headedness and headaches.  Psychiatric/Behavioral: Negative for dysphoric mood. The patient is not nervous/anxious.        Objective:   Vitals:   08/25/19 1045  BP: 128/72  Pulse: (!) 53  Resp: 16  Temp: 98.2 F (36.8 C)  SpO2: 98%   Filed Weights   08/25/19 1045  Weight: 119 lb 3.2 oz (54.1 kg)   Body mass index is 21.8 kg/m.  BP Readings from Last 3 Encounters:  08/25/19 128/72  08/13/19 (!) 114/58  05/14/19 (!) 106/55    Wt Readings from Last 3 Encounters:  08/25/19 119 lb 3.2 oz (54.1 kg)  08/13/19 116 lb 6.4 oz (52.8 kg)  05/14/19 117 lb 3.2 oz (53.2 kg)     Physical Exam Constitutional: She appears well-developed and well-nourished. No distress.  HENT:  Head: Normocephalic and atraumatic.  Right Ear: External ear normal. Normal ear canal and TM Left Ear: External ear normal.  Normal ear canal and TM Mouth/Throat: Oropharynx is clear and moist.  Eyes: Conjunctivae and EOM are normal.  No significant eyelid swelling, upper eyelids are slightly droopy, but she denies any effects on vision Neck: Neck supple. No tracheal deviation present. No thyromegaly present.  No carotid bruit  Cardiovascular: Normal rate, regular rhythm and normal heart sounds.   No murmur heard.  No edema. Pulmonary/Chest: Effort normal and breath sounds normal. No respiratory distress. She has no wheezes. She has no rales.   Abdominal: Soft. She exhibits no distension. There is no tenderness.  Lymphadenopathy: She has no cervical adenopathy.  Skin: Skin is warm and dry. She is not diaphoretic.  Psychiatric: She has a normal mood and affect. Her behavior is normal.        Assessment & Plan:   Physical exam: Screening blood work    reviewed Immunizations  Up to date    Colonoscopy  No longer needed - last done 2016 Mammogram up-to-date Dexa  Up to date - due later this year  Eye exams  Up to date  Exercise walking regularly Weight   Normal BMI Substance abuse  none  See Problem List for Assessment and Plan of chronic medical problems.     This visit occurred during the SARS-CoV-2 public health emergency.  Safety protocols were in place, including screening questions prior to the visit, additional usage of staff PPE, and extensive cleaning of exam room while observing appropriate contact time as indicated for disinfecting solutions.

## 2019-08-25 ENCOUNTER — Encounter: Payer: Self-pay | Admitting: Internal Medicine

## 2019-08-25 ENCOUNTER — Ambulatory Visit (INDEPENDENT_AMBULATORY_CARE_PROVIDER_SITE_OTHER): Payer: Medicare PPO | Admitting: Internal Medicine

## 2019-08-25 ENCOUNTER — Other Ambulatory Visit: Payer: Self-pay

## 2019-08-25 VITALS — BP 128/72 | HR 53 | Temp 98.2°F | Resp 16 | Ht 62.0 in | Wt 119.2 lb

## 2019-08-25 DIAGNOSIS — M85859 Other specified disorders of bone density and structure, unspecified thigh: Secondary | ICD-10-CM

## 2019-08-25 DIAGNOSIS — N952 Postmenopausal atrophic vaginitis: Secondary | ICD-10-CM

## 2019-08-25 DIAGNOSIS — Z0000 Encounter for general adult medical examination without abnormal findings: Secondary | ICD-10-CM | POA: Diagnosis not present

## 2019-08-25 DIAGNOSIS — Z96642 Presence of left artificial hip joint: Secondary | ICD-10-CM | POA: Diagnosis not present

## 2019-08-25 MED ORDER — FOLIC ACID 1 MG PO TABS: ORAL_TABLET | ORAL | 3 refills | Status: DC

## 2019-08-25 NOTE — Assessment & Plan Note (Signed)
Chronic Uses estradiol cream twice weekly Symptoms well controlled Continue twice weekly

## 2019-08-25 NOTE — Assessment & Plan Note (Signed)
Chronic DEXA up-to-date Taking vitamin D daily and multivitamin Walking regularly for exercise

## 2019-08-27 ENCOUNTER — Encounter: Payer: Self-pay | Admitting: Internal Medicine

## 2019-08-28 ENCOUNTER — Encounter: Payer: Self-pay | Admitting: Internal Medicine

## 2019-08-28 ENCOUNTER — Telehealth: Payer: Self-pay

## 2019-08-28 NOTE — Telephone Encounter (Signed)
She is calling regarding recent office visit.  Per office note it mentions stage 3 kidney disease. She was not aware of this. She is concerned and alarmed now.

## 2019-08-28 NOTE — Telephone Encounter (Signed)
More than 1/2 of patients of her age has decline in kidney function due to age Nothing to be done, just focus on hydration and BP control

## 2019-08-28 NOTE — Telephone Encounter (Signed)
Called and given below message. She verbalized understanding. 

## 2019-08-31 DIAGNOSIS — Z7189 Other specified counseling: Secondary | ICD-10-CM | POA: Insufficient documentation

## 2019-08-31 DIAGNOSIS — D638 Anemia in other chronic diseases classified elsewhere: Secondary | ICD-10-CM | POA: Insufficient documentation

## 2019-08-31 DIAGNOSIS — R002 Palpitations: Secondary | ICD-10-CM | POA: Insufficient documentation

## 2019-08-31 DIAGNOSIS — D649 Anemia, unspecified: Secondary | ICD-10-CM | POA: Insufficient documentation

## 2019-08-31 NOTE — Progress Notes (Signed)
Cardiology Office [name]   Date:  09/01/2019   ID:  KATHRINE KRAMMES, DOB 04/21/1935, MRN [id number]  PCP:  Binnie Rail, MD  Cardiologist:   Minus Breeding, MD  Chief Complaint  Patient presents with  . Palpitations      History of Present Illness: [name] [name] is a 85 y.o. female who presents for evaluation of palpitations.  She has had SVT.   She wore a monitor and she had rare SVT.  I added beta blocker.  Since then she has done quite well.  She does not get any further palpitations.  She has had her hip replaced.  She walks for exercise. The patient denies any new symptoms such as chest discomfort, neck or arm discomfort. There has been no new shortness of breath, PND or orthopnea. There have been no reported palpitations, presyncope or syncope.   She is getting ready soon to go to [place] where they have a [place].  Past Medical History:  Diagnosis Date  . Anemia requiring transfusions 2010 & 2015  . Arthritis    DJD  . Breast cancer ([place]) 1993   S/P lumpectomy , radiation, & Tamoxifen  . CML (chronic myeloid leukemia) (HCC)    Dr Alvy Bimler;  . SVT (supraventricular tachycardia) [facility])    dx june 2020 , magd by Dr Percival Spanish ; reports occasional heart flutter    Past Surgical History:  Procedure Laterality Date  . ABDOMINAL HYSTERECTOMY  ~1989   no BSO, for Dysplasia   . APPENDECTOMY  ~1989  . BILATERAL SALPINGOOPHORECTOMY  2010   with bowel resection  . BREAST SURGERY     Right lumpectomy, s/p radiation 1993 and oral  Tamoxifen x 5 years [id number])   . CATARACT EXTRACTION  06/08/2014   left eye;Dr Herbert Deaner  . CATARACT EXTRACTION, BILATERAL Bilateral 2015  . COLONOSCOPY  03/2003, last 2010   diverticulosis, Dr.Kaplan  . EXCISION MORTON'S NEUROMA Left mid 80's   foot  . gastrocslide     left leg; Dr Beola Cord  . ileostomy reversal  2011   3 mos after above  . KNEE SURGERY  mid 80's   Arthroscopy, right knee   . LEG TENDON SURGERY Left 2013   "cut on  leg and fixed something"  . OPEN SURGICAL REPAIR OF GLUTEAL TENDON Left 10/24/2016   Procedure: Left hip bursectomy; gluteal tendon repair;  Surgeon: Gaynelle Arabian, MD;  Location: [facility];  Service: Orthopedics;  Laterality: Left;  . sigmoid colon resection  2010    Dr Arlyce Dice, [place] for fistula & diverticulitis  . TENDON REPAIR Left 2019   left shoulder tendon repair - surgical center   . TOTAL HIP ARTHROPLASTY Left 03/02/2019   Procedure: TOTAL HIP ARTHROPLASTY ANTERIOR APPROACH;  Surgeon: Gaynelle Arabian, MD;  Location: [facility];  Service: Orthopedics;  Laterality: Left;  174min  . TOTAL KNEE ARTHROPLASTY Right 06/11/2013   Procedure: RIGHT TOTAL KNEE ARTHROPLASTY;  Surgeon: Sydnee Cabal, MD;  Location: [facility];  Service: Orthopedics;  Laterality: Right;  . TRIGGER FINGER RELEASE Left 2013   thumb     Current Outpatient Medications  Medication Sig Dispense Refill  . cholecalciferol (VITAMIN D3) 25 MCG (1000 UT) tablet Take 1,000 Units by mouth daily.    . Cyanocobalamin (B-12) 5000 MCG CAPS Take 1 tablet by mouth daily.    Marland Kitchen estradiol (ESTRACE) 0.1 MG/GM vaginal cream Insert 1 applicator full 2 times weekly. Need office visit for more refills. 42.5 g 0  .  FINACEA 15 % cream Apply 1 application topically daily.  2  . folic acid (FOLVITE) 1 MG tablet TAKE 1 TABLET(1 MG) BY MOUTH DAILY. 90 tablet 3  . Glucosamine-Chondroit-Vit C-Mn (GLUCOSAMINE CHONDR 1500 COMPLX) CAPS Take 1 capsule by mouth daily.    Marland Kitchen imatinib (GLEEVEC) 100 MG tablet Take 3 tablets (300 mg total) by mouth daily. Take with meals and large glass of water.Caution:Chemotherapy 270 tablet 11  . Loperamide HCl (IMODIUM PO) Take 0.5-1 tablets by mouth 2 (two) times daily as needed (takes 0.5 tablet every morning and 2nd dose if needed for diarrhea).     . metoprolol tartrate (LOPRESSOR) 25 MG tablet Take 1 tablet (25 mg total) by mouth 2 (two) times daily. 180 tablet 3  . Multiple Vitamin (MULTIVITAMIN) tablet Take 1 tablet by  mouth daily.    . Multiple Vitamins-Minerals (PRESERVISION AREDS 2) CAPS Take 1 capsule by mouth 2 (two) times a day.    . Probiotic Product (ALIGN) 4 MG CAPS Take 1 capsule by mouth every other day.      No current facility-administered medications for this visit.    Allergies:   Penicillins    ROS:  Please see the history of present illness.   Otherwise, review of systems are positive for none.   All other systems are reviewed and negative.    PHYSICAL EXAM: VS:  BP 114/67   Pulse 60   Ht 5\' 1"  (1.549 m)   Wt 119 lb (54 kg)   SpO2 100%   BMI 22.48 kg/m  , BMI Body mass index is 22.48 kg/m. GENERAL:  Well appearing HEENT:  Pupils equal round and reactive, fundi not visualized, oral mucosa unremarkable NECK:  No jugular venous distention, waveform within normal limits, carotid upstroke brisk and symmetric, no bruits, no thyromegaly LYMPHATICS:  No cervical, inguinal adenopathy LUNGS:  Clear to auscultation bilaterally BACK:  No CVA tenderness CHEST:  Unremarkable HEART:  PMI not displaced or sustained,S1 and S2 within normal limits, no S3, no S4, no clicks, no rubs, no murmurs ABD:  Flat, positive bowel sounds normal in frequency in pitch, no bruits, no rebound, no guarding, no midline pulsatile mass, no hepatomegaly, no splenomegaly EXT:  2 plus pulses throughout, no edema, no cyanosis no clubbing   EKG:  EKG is ordered today. The ekg ordered today demonstrates sinus rhythm, rate 60, axis within normal limits, intervals within normal limits, no acute ST-T wave changes.   Recent Labs: 08/03/2019: ALT 24; BUN 22; Creatinine, Ser 1.18; Hemoglobin 10.4; Platelets 125; Potassium 4.4; Sodium 141    Lipid Panel    Component Value Date/Time   CHOL 187 02/26/2017 1003   TRIG 59.0 02/26/2017 1003   HDL 83.00 02/26/2017 1003   CHOLHDL 2 02/26/2017 1003   VLDL 11.8 02/26/2017 1003   LDLCALC 92 02/26/2017 1003      Wt Readings from Last 3 Encounters:  09/01/19 119 lb (54 kg)   08/25/19 119 lb 3.2 oz (54.1 kg)  08/13/19 116 lb 6.4 oz (52.8 kg)      Other studies Reviewed: Additional studies/ records that were reviewed today include: None. Review of the above records demonstrates:  Please see elsewhere in the [name].     ASSESSMENT AND PLAN:  PALPITATIONS:     The patient has no symptomatic tachypalpitations.  We talked about stopping the metoprolol but she wants to keep taking until she comes back later this year.  She will then go to once a day and then  may be off and see if her palpitations have now abated.  At the time she was having the symptoms she was having lots of hip pain.  COVID EDUCATION: She has had her vaccine.  Current medicines are reviewed at length with the patient today.  The patient does not have concerns regarding medicines.  The following changes have been made:  no change  Labs/ tests ordered today include: none No orders of the defined types were placed in this encounter.    Disposition:   FU with me in one year.     Signed, Minus Breeding, MD  09/01/2019 5:12 PM    [place]

## 2019-09-01 ENCOUNTER — Ambulatory Visit (INDEPENDENT_AMBULATORY_CARE_PROVIDER_SITE_OTHER): Payer: Medicare PPO | Admitting: Cardiology

## 2019-09-01 ENCOUNTER — Encounter: Payer: Self-pay | Admitting: Cardiology

## 2019-09-01 ENCOUNTER — Other Ambulatory Visit: Payer: Self-pay

## 2019-09-01 VITALS — BP 114/67 | HR 60 | Ht 61.0 in | Wt 119.0 lb

## 2019-09-01 DIAGNOSIS — D649 Anemia, unspecified: Secondary | ICD-10-CM

## 2019-09-01 DIAGNOSIS — Z7189 Other specified counseling: Secondary | ICD-10-CM

## 2019-09-01 DIAGNOSIS — R002 Palpitations: Secondary | ICD-10-CM | POA: Diagnosis not present

## 2019-09-01 NOTE — Patient Instructions (Signed)

## 2019-09-04 ENCOUNTER — Other Ambulatory Visit (INDEPENDENT_AMBULATORY_CARE_PROVIDER_SITE_OTHER): Payer: Medicare PPO

## 2019-09-04 DIAGNOSIS — R002 Palpitations: Secondary | ICD-10-CM | POA: Diagnosis not present

## 2019-11-03 ENCOUNTER — Other Ambulatory Visit: Payer: Self-pay | Admitting: Cardiology

## 2019-11-03 MED ORDER — METOPROLOL TARTRATE 25 MG PO TABS: 25.0000 mg | ORAL_TABLET | Freq: Two times a day (BID) | ORAL | 0 refills | Status: DC

## 2019-11-03 NOTE — Telephone Encounter (Signed)
*  STAT* If patient is at the pharmacy, call can be transferred to refill team.   1. Which medications need to be refilled? (please list name of each medication and dose if known)  metoprolol tartrate (LOPRESSOR) 25 MG tablet  2. Which pharmacy/location (including street and city if local pharmacy) is medication to be sent to?   [facility] [phone] - [place], [place] - [address] AT [address]  3. Do they need a 30 day or 90 day supply? 90 with refills  Pt is visiting in [place] and needs this refill sent to the local Pharmacy.  Pt tried to refill the medication there because she is almost out of medication

## 2019-11-13 ENCOUNTER — Telehealth: Payer: Self-pay

## 2019-11-13 NOTE — Telephone Encounter (Signed)
Pt called and LVM stating she is now in [place], but Dr Alvy Bimler has put in standing orders for labs to be drawn from facility in [place]; however, this standing order has expired and needs new orders faxed to [phone]. Returned call to pt to let her know this has been requested and sent to Dr Alvy Bimler.

## 2019-11-16 ENCOUNTER — Encounter: Payer: Self-pay | Admitting: Hematology and Oncology

## 2019-11-16 NOTE — Telephone Encounter (Signed)
Faxed lab orders to to [phone]. Received confirmation.

## 2019-11-16 NOTE — Telephone Encounter (Signed)
I have signed a note, will give it to you to fax

## 2020-01-29 DIAGNOSIS — H26493 Other secondary cataract, bilateral: Secondary | ICD-10-CM | POA: Diagnosis not present

## 2020-01-29 DIAGNOSIS — H524 Presbyopia: Secondary | ICD-10-CM | POA: Diagnosis not present

## 2020-01-29 DIAGNOSIS — H04123 Dry eye syndrome of bilateral lacrimal glands: Secondary | ICD-10-CM | POA: Diagnosis not present

## 2020-02-01 ENCOUNTER — Encounter: Payer: Self-pay | Admitting: Internal Medicine

## 2020-02-01 ENCOUNTER — Other Ambulatory Visit: Payer: Self-pay

## 2020-02-01 ENCOUNTER — Inpatient Hospital Stay: Payer: Medicare PPO | Attending: Hematology and Oncology

## 2020-02-01 DIAGNOSIS — Z79899 Other long term (current) drug therapy: Secondary | ICD-10-CM | POA: Insufficient documentation

## 2020-02-01 DIAGNOSIS — C9210 Chronic myeloid leukemia, BCR/ABL-positive, not having achieved remission: Secondary | ICD-10-CM | POA: Diagnosis not present

## 2020-02-01 DIAGNOSIS — T451X5A Adverse effect of antineoplastic and immunosuppressive drugs, initial encounter: Secondary | ICD-10-CM | POA: Diagnosis not present

## 2020-02-01 DIAGNOSIS — N183 Chronic kidney disease, stage 3 unspecified: Secondary | ICD-10-CM | POA: Diagnosis not present

## 2020-02-01 DIAGNOSIS — D61810 Antineoplastic chemotherapy induced pancytopenia: Secondary | ICD-10-CM | POA: Insufficient documentation

## 2020-02-01 DIAGNOSIS — Z23 Encounter for immunization: Secondary | ICD-10-CM

## 2020-02-01 LAB — COMPREHENSIVE METABOLIC PANEL
ALT: 22 U/L (ref 0–44)
AST: 39 U/L (ref 15–41)
Albumin: 4 g/dL (ref 3.5–5.0)
Alkaline Phosphatase: 54 U/L (ref 38–126)
Anion gap: 7 (ref 5–15)
BUN: 27 mg/dL — ABNORMAL HIGH (ref 8–23)
CO2: 30 mmol/L (ref 22–32)
Calcium: 9.8 mg/dL (ref 8.9–10.3)
Chloride: 103 mmol/L (ref 98–111)
Creatinine, Ser: 1.28 mg/dL — ABNORMAL HIGH (ref 0.44–1.00)
GFR calc Af Amer: 45 mL/min — ABNORMAL LOW (ref >60)
GFR calc non Af Amer: 39 mL/min — ABNORMAL LOW (ref >60)
Glucose, Bld: 79 mg/dL (ref 70–99)
Potassium: 4.4 mmol/L (ref 3.5–5.1)
Sodium: 140 mmol/L (ref 135–145)
Total Bilirubin: 0.6 mg/dL (ref 0.3–1.2)
Total Protein: 6.7 g/dL (ref 6.5–8.1)

## 2020-02-01 LAB — CBC WITH DIFFERENTIAL/PLATELET
Abs Immature Granulocytes: 0.01 10*3/uL (ref 0.00–0.07)
Basophils Absolute: 0 10*3/uL (ref 0.0–0.1)
Basophils Relative: 1 %
Eosinophils Absolute: 0.2 10*3/uL (ref 0.0–0.5)
Eosinophils Relative: 5 %
HCT: 29.7 % — ABNORMAL LOW (ref 36.0–46.0)
Hemoglobin: 10.1 g/dL — ABNORMAL LOW (ref 12.0–15.0)
Immature Granulocytes: 0 %
Lymphocytes Relative: 25 %
Lymphs Abs: 0.9 10*3/uL (ref 0.7–4.0)
MCH: 38.8 pg — ABNORMAL HIGH (ref 26.0–34.0)
MCHC: 34 g/dL (ref 30.0–36.0)
MCV: 114.2 fL — ABNORMAL HIGH (ref 80.0–100.0)
Monocytes Absolute: 0.5 10*3/uL (ref 0.1–1.0)
Monocytes Relative: 14 %
Neutro Abs: 2 10*3/uL (ref 1.7–7.7)
Neutrophils Relative %: 55 %
Platelets: 129 10*3/uL — ABNORMAL LOW (ref 150–400)
RBC: 2.6 MIL/uL — ABNORMAL LOW (ref 3.87–5.11)
RDW: 13.6 % (ref 11.5–15.5)
WBC: 3.6 10*3/uL — ABNORMAL LOW (ref 4.0–10.5)
nRBC: 0 % (ref 0.0–0.2)

## 2020-02-08 LAB — BCR/ABL

## 2020-02-09 ENCOUNTER — Telehealth: Payer: Self-pay

## 2020-02-09 NOTE — Telephone Encounter (Signed)
Called and moved rescheduled appt to tomorrow 10/13. She is aware of the time.

## 2020-02-10 ENCOUNTER — Other Ambulatory Visit: Payer: Self-pay

## 2020-02-10 ENCOUNTER — Encounter: Payer: Self-pay | Admitting: Hematology and Oncology

## 2020-02-10 ENCOUNTER — Inpatient Hospital Stay: Payer: Medicare PPO | Admitting: Hematology and Oncology

## 2020-02-10 VITALS — BP 114/61 | HR 67 | Temp 97.4°F | Resp 18 | Ht 61.0 in | Wt 116.6 lb

## 2020-02-10 DIAGNOSIS — D61810 Antineoplastic chemotherapy induced pancytopenia: Secondary | ICD-10-CM | POA: Diagnosis not present

## 2020-02-10 DIAGNOSIS — Z79899 Other long term (current) drug therapy: Secondary | ICD-10-CM | POA: Diagnosis not present

## 2020-02-10 DIAGNOSIS — N183 Chronic kidney disease, stage 3 unspecified: Secondary | ICD-10-CM | POA: Diagnosis not present

## 2020-02-10 DIAGNOSIS — T451X5A Adverse effect of antineoplastic and immunosuppressive drugs, initial encounter: Secondary | ICD-10-CM

## 2020-02-10 DIAGNOSIS — C9210 Chronic myeloid leukemia, BCR/ABL-positive, not having achieved remission: Secondary | ICD-10-CM

## 2020-02-10 MED ORDER — METOPROLOL TARTRATE 25 MG PO TABS: 12.5000 mg | ORAL_TABLET | Freq: Two times a day (BID) | ORAL | 0 refills | Status: DC

## 2020-02-10 NOTE — Progress Notes (Signed)
[address] OFFICE PROGRESS NOTE  Patient Care Team: Binnie Rail, MD as PCP - General (Internal Medicine) Minus Breeding, MD as PCP - Cardiology (Cardiology) Gaynelle Arabian, MD as Consulting Physician (Orthopedic Surgery) Heath Lark, MD as Consulting Physician (Hematology and Oncology)  ASSESSMENT & PLAN:  Chronic myeloid leukemia Her blood count is stable So far, she tolerated treatment well without recent diarrhea, fluid retention or shortness of breath She has mild pancytopenia with treatment but she is not symptomatic BCR/ABL is not detected and she remained in major molecular response We will continue treatment indefinitely I will schedule return appointment in 6 months  Pancytopenia due to antineoplastic chemotherapy [facility]) She has stable pancytopenia Serum vitamin B12 and thyroid function were within normal limits She will continue Gleevec as prescribed  CKD (chronic kidney disease), stage III Her serum creatinine fluctuated a little bit She is not symptomatic Observe for now   Orders Placed This Encounter  Procedures  . CBC with Differential/Platelet    Standing Status:   Standing    Number of Occurrences:   22    Standing Expiration Date:   02/09/2021  . Comprehensive metabolic panel    Standing Status:   Standing    Number of Occurrences:   33    Standing Expiration Date:   02/09/2021  . BCR-ABL    With RT-PCR technique    Standing Status:   Standing    Number of Occurrences:   22    Standing Expiration Date:   02/09/2021    All questions were answered. The patient knows to call the clinic with any problems, questions or concerns. The total time spent in the appointment was 20 minutes encounter with patients including review of chart and various tests results, discussions about plan of care and coordination of care plan   Heath Lark, MD 02/10/2020 11:44 AM  INTERVAL HISTORY: Please see below for problem oriented charting. She returns  with her husband for further follow-up She is considering selling her property in [place] and staying in [place] without further traveling back and forth between the 2 states Denies recent infection She is up-to-date with all her vaccination No shortness of breath or fluid retention She has no problem getting her medications refilled  SUMMARY OF ONCOLOGIC HISTORY: Oncology History  Chronic myeloid leukemia ([place])  02/18/2007 Initial Diagnosis   Chronic myeloid leukemia   08/20/2014 Tumor Marker   BCR/ABL is undetectable   03/04/2015 Pathology Results   BCR/ABL is undetectable   08/29/2015 Tumor Marker   BCR/ABL is undetectable   02/29/2016 Pathology Results   BCR/ABL is undetectable   05/01/2016 Pathology Results   BCR/ABL b2a2 is detected at 0.03% and IS ratio 0.069%   05/31/2016 Pathology Results   BCR/ABL b2a2 is detected at 0.12% and IS ratio 0.276%   06/19/2016 Pathology Results   BCR/ABL b2a2 is detected at 0.24% and IS ratio 0.552%   06/19/2016 Miscellaneous   ABL mutation kinase testing is negative   06/27/2016 Miscellaneous   Dose of Gleevec is increased to 600 mg daily   07/25/2016 Pathology Results   BCR/ABL b2a2 is detected at 0.03% and IS ratio 0.069%. She has achieved MMR   08/30/2016 Miscellaneous   Dose of Gleevec is reduced to 300 mg daily and subsequently down to 200 mg daily   10/22/2016 Pathology Results   BCR/ABL b2a2 is not detected. She has achieved MMR   01/03/2017 Pathology Results   BCR/ABL b2a2 is detected IS ratio 0.041%  02/18/2017 Pathology Results   BCR/ABL b2a2 is detected IS ratio 0.0126   05/21/2017 Pathology Results   BCR/ABL b2a2 is not detected. She has achieved MMR   08/20/2017 Pathology Results   BCR/ABL b2a2 is not detected. She has achieved MMR   11/12/2017 Pathology Results   BCR/ABL b2a2 is not detected   04/07/2018 Pathology Results   BCR/ABL b2a2 is detected IS ratio 0.0062. She is in MMR   06/27/2018 Pathology Results    BCR/ABL b2a2 is detected IS ratio 0.0097%. She is in MMR   08/29/2018 Pathology Results   BCR/ABL is undetectable. She is in MMR   12/26/2018 Pathology Results   BCR/ABL b2a2 is detected IS ratio 0.0042%. She is in MMR   05/04/2019 Pathology Results   BCR/ABL b2a2 is detected IS ratio 0.0575%. She is in MMR   08/03/2019 Pathology Results   BCR/ABL b2a2 is detected IS ratio 0.0167%. She is in MMR   02/01/2020 Pathology Results   BCR/ABL is undetectable     REVIEW OF SYSTEMS:   Constitutional: Denies fevers, chills or abnormal weight loss Eyes: Denies blurriness of vision Ears, nose, mouth, throat, and face: Denies mucositis or sore throat Respiratory: Denies cough, dyspnea or wheezes Cardiovascular: Denies palpitation, chest discomfort or lower extremity swelling Gastrointestinal:  Denies nausea, heartburn or change in bowel habits Skin: Denies abnormal skin rashes Lymphatics: Denies new lymphadenopathy or easy bruising Neurological:Denies numbness, tingling or new weaknesses Behavioral/Psych: Mood is stable, no new changes  All other systems were reviewed with the patient and are negative.  I have reviewed the past medical history, past surgical history, social history and family history with the patient and they are unchanged from previous note.  ALLERGIES:  is allergic to penicillins.  MEDICATIONS:  Current Outpatient Medications  Medication Sig Dispense Refill  . cholecalciferol (VITAMIN D3) 25 MCG (1000 UT) tablet Take 1,000 Units by mouth daily.    . Cyanocobalamin (B-12) 5000 MCG CAPS Take 1 tablet by mouth daily.    Marland Kitchen estradiol (ESTRACE) 0.1 MG/GM vaginal cream Insert 1 applicator full 2 times weekly. Need office visit for more refills. 42.5 g 0  . FINACEA 15 % cream Apply 1 application topically daily.  2  . folic acid (FOLVITE) 1 MG tablet TAKE 1 TABLET(1 MG) BY MOUTH DAILY. 90 tablet 3  . Glucosamine-Chondroit-Vit C-Mn (GLUCOSAMINE CHONDR 1500 COMPLX) CAPS Take 1 capsule  by mouth daily.    Marland Kitchen imatinib (GLEEVEC) 100 MG tablet Take 3 tablets (300 mg total) by mouth daily. Take with meals and large glass of water.Caution:Chemotherapy 270 tablet 11  . Loperamide HCl (IMODIUM PO) Take 0.5-1 tablets by mouth 2 (two) times daily as needed (takes 0.5 tablet every morning and 2nd dose if needed for diarrhea).     . metoprolol tartrate (LOPRESSOR) 25 MG tablet Take 0.5 tablets (12.5 mg total) by mouth 2 (two) times daily. 180 tablet 0  . Multiple Vitamin (MULTIVITAMIN) tablet Take 1 tablet by mouth daily.    . Multiple Vitamins-Minerals (PRESERVISION AREDS 2) CAPS Take 1 capsule by mouth 2 (two) times a day.    . Probiotic Product (ALIGN) 4 MG CAPS Take 1 capsule by mouth every other day.      No current facility-administered medications for this visit.    PHYSICAL EXAMINATION: ECOG PERFORMANCE STATUS: 1 - Symptomatic but completely ambulatory  Vitals:   02/10/20 1110  BP: 114/61  Pulse: 67  Resp: 18  Temp: (!) 97.4 F (36.3 C)  SpO2: 100%  Filed Weights   02/10/20 1110  Weight: 116 lb 9.6 oz (52.9 kg)    GENERAL:alert, no distress and comfortable SKIN: skin color, texture, turgor are normal, no rashes or significant lesions EYES: normal, Conjunctiva are pink and non-injected, sclera clear OROPHARYNX:no exudate, no erythema and lips, buccal mucosa, and tongue normal  NECK: supple, thyroid normal size, non-tender, without nodularity LYMPH:  no palpable lymphadenopathy in the cervical, axillary or inguinal LUNGS: clear to auscultation and percussion with normal breathing effort HEART: regular rate & rhythm and no murmurs and no lower extremity edema ABDOMEN:abdomen soft, non-tender and normal bowel sounds Musculoskeletal:no cyanosis of digits and no clubbing  NEURO: alert & oriented x 3 with fluent speech, no focal motor/sensory deficits  LABORATORY DATA:  I have reviewed the data as listed    Component Value Date/Time   NA 140 02/01/2020 1023   NA  140 02/18/2017 0808   K 4.4 02/01/2020 1023   K 4.4 02/18/2017 0808   CL 103 02/01/2020 1023   CL 105 09/04/2012 1006   CO2 30 02/01/2020 1023   CO2 29 02/18/2017 0808   GLUCOSE 79 02/01/2020 1023   GLUCOSE 86 02/18/2017 0808   GLUCOSE 96 09/04/2012 1006   BUN 27 (H) 02/01/2020 1023   BUN 20.3 02/18/2017 0808   CREATININE 1.28 (H) 02/01/2020 1023   CREATININE 0.93 05/04/2019 1108   CREATININE 1.1 02/18/2017 0808   CALCIUM 9.8 02/01/2020 1023   CALCIUM 9.4 02/18/2017 0808   PROT 6.7 02/01/2020 1023   PROT 6.6 02/18/2017 0808   ALBUMIN 4.0 02/01/2020 1023   ALBUMIN 4.2 02/18/2017 0808   AST 39 02/01/2020 1023   AST 34 05/04/2019 1108   AST 34 02/18/2017 0808   ALT 22 02/01/2020 1023   ALT 19 05/04/2019 1108   ALT 18 02/18/2017 0808   ALKPHOS 54 02/01/2020 1023   ALKPHOS 53 02/18/2017 0808   BILITOT 0.6 02/01/2020 1023   BILITOT 0.9 05/04/2019 1108   BILITOT 0.76 02/18/2017 0808   GFRNONAA 39 (L) 02/01/2020 1023   GFRNONAA 57 (L) 05/04/2019 1108   GFRAA 45 (L) 02/01/2020 1023   GFRAA >60 05/04/2019 1108    No results found for: SPEP, UPEP  Lab Results  Component Value Date   WBC 3.6 (L) 02/01/2020   NEUTROABS 2.0 02/01/2020   HGB 10.1 (L) 02/01/2020   HCT 29.7 (L) 02/01/2020   MCV 114.2 (H) 02/01/2020   PLT 129 (L) 02/01/2020      Chemistry      Component Value Date/Time   NA 140 02/01/2020 1023   NA 140 02/18/2017 0808   K 4.4 02/01/2020 1023   K 4.4 02/18/2017 0808   CL 103 02/01/2020 1023   CL 105 09/04/2012 1006   CO2 30 02/01/2020 1023   CO2 29 02/18/2017 0808   BUN 27 (H) 02/01/2020 1023   BUN 20.3 02/18/2017 0808   CREATININE 1.28 (H) 02/01/2020 1023   CREATININE 0.93 05/04/2019 1108   CREATININE 1.1 02/18/2017 0808      Component Value Date/Time   CALCIUM 9.8 02/01/2020 1023   CALCIUM 9.4 02/18/2017 0808   ALKPHOS 54 02/01/2020 1023   ALKPHOS 53 02/18/2017 0808   AST 39 02/01/2020 1023   AST 34 05/04/2019 1108   AST 34 02/18/2017 0808    ALT 22 02/01/2020 1023   ALT 19 05/04/2019 1108   ALT 18 02/18/2017 0808   BILITOT 0.6 02/01/2020 1023   BILITOT 0.9 05/04/2019 1108   BILITOT 0.76  02/18/2017 0808       

## 2020-02-10 NOTE — Assessment & Plan Note (Signed)
She has stable pancytopenia Serum vitamin B12 and thyroid function were within normal limits She will continue Gleevec as prescribed 

## 2020-02-10 NOTE — Assessment & Plan Note (Signed)
Her serum creatinine fluctuated a little bit She is not symptomatic Observe for now 

## 2020-02-10 NOTE — Assessment & Plan Note (Signed)
Her blood count is stable So far, she tolerated treatment well without recent diarrhea, fluid retention or shortness of breath She has mild pancytopenia with treatment but she is not symptomatic BCR/ABL is not detected and she remained in major molecular response We will continue treatment indefinitely I will schedule return appointment in 6 months 

## 2020-02-11 ENCOUNTER — Inpatient Hospital Stay: Payer: Medicare PPO | Admitting: Hematology and Oncology

## 2020-02-12 ENCOUNTER — Telehealth: Payer: Self-pay | Admitting: Hematology and Oncology

## 2020-02-12 NOTE — Telephone Encounter (Signed)
Scheduled per 10/13 sch msg. Called and spoke with pt, confirmed 4/4 and 4/14 appts

## 2020-03-03 ENCOUNTER — Other Ambulatory Visit: Payer: Self-pay | Admitting: Internal Medicine

## 2020-03-11 ENCOUNTER — Other Ambulatory Visit: Payer: Self-pay | Admitting: Cardiology

## 2020-03-11 MED ORDER — METOPROLOL TARTRATE 25 MG PO TABS
12.5000 mg | ORAL_TABLET | Freq: Two times a day (BID) | ORAL | 0 refills | Status: DC
Start: 2020-03-11 — End: 2020-05-30

## 2020-03-11 NOTE — Telephone Encounter (Signed)
Rx has been sent to the pharmacy electronically. ° °

## 2020-03-11 NOTE — Telephone Encounter (Signed)
*  STAT* If patient is at the pharmacy, call can be transferred to refill team.   1. Which medications need to be refilled? (please list name of each medication and dose if known) metoprolol tartrate (LOPRESSOR) 25 MG tablet  2. Which pharmacy/location (including street and city if local pharmacy) is medication to be sent to? [facility] [place], [place] AT [address]  3. Do they need a 30 day or 90 day supply? 90 day

## 2020-04-07 DIAGNOSIS — M25551 Pain in right hip: Secondary | ICD-10-CM | POA: Diagnosis not present

## 2020-04-07 DIAGNOSIS — Z96651 Presence of right artificial knee joint: Secondary | ICD-10-CM | POA: Diagnosis not present

## 2020-04-07 DIAGNOSIS — Z96642 Presence of left artificial hip joint: Secondary | ICD-10-CM | POA: Diagnosis not present

## 2020-04-27 ENCOUNTER — Ambulatory Visit (INDEPENDENT_AMBULATORY_CARE_PROVIDER_SITE_OTHER): Payer: Medicare PPO

## 2020-04-27 ENCOUNTER — Other Ambulatory Visit: Payer: Self-pay

## 2020-04-27 VITALS — BP 118/60 | HR 61 | Temp 97.5°F | Ht 61.0 in | Wt 119.2 lb

## 2020-04-27 DIAGNOSIS — Z0000 Encounter for general adult medical examination without abnormal findings: Secondary | ICD-10-CM

## 2020-04-27 NOTE — Progress Notes (Addendum)
Subjective:   [name] [name] is a 85 y.o. female who presents for Medicare Annual (Subsequent) preventive examination.  Review of Systems    No ROS. Medicare Wellness Visit. Additional risk factors are reflected in social history. Cardiac Risk Factors include: advanced age (>97men, >30 women);family history of premature cardiovascular disease     Objective:    Today's Vitals   04/27/20 1312 04/27/20 1351  BP: 118/60   Pulse: 61   Temp: (!) 97.5 F (36.4 C)   SpO2: 98%   Weight: 119 lb 3.2 oz (54.1 kg)   Height: 5\' 1"  (1.549 m)   PainSc: 0-No pain 0-No pain   Body mass index is 22.52 kg/m.  Advanced Directives 04/27/2020 03/31/2019 03/02/2019 03/02/2019 02/26/2019 09/13/2018 10/24/2016  Does Patient Have a Medical Advance Directive? Yes Yes - Yes Yes Yes Yes  Type of Advance Directive - Healthcare Power of [place];Living will Living will - Living will Healthcare Power of [place];Living will Healthcare Power of [place];Living will  Does patient want to make changes to medical advance directive? No - Patient declined - No - Patient declined No - Patient declined No - Patient declined - No - Patient declined  Copy of Healthcare Power of Attorney in Chart? - No - copy requested - - - - No - copy requested  Pre-existing out of facility DNR order (yellow form or pink MOST form) - - - - - - -    Current Medications (verified) Outpatient Encounter Medications as of 04/27/2020  Medication Sig   cholecalciferol (VITAMIN D3) 25 MCG (1000 UT) tablet Take 1,000 Units by mouth daily.   Cyanocobalamin (B-12) 5000 MCG CAPS Take 1 tablet by mouth daily.   estradiol (ESTRACE) 0.1 MG/GM vaginal cream INSERT 1 APPLICATORFUL VAGINALLY 2 TIMES WEEKLY   FINACEA 15 % cream Apply 1 application topically daily.   folic acid (FOLVITE) 1 MG tablet TAKE 1 TABLET(1 MG) BY MOUTH DAILY.   Glucosamine-Chondroit-Vit C-Mn (GLUCOSAMINE CHONDR 1500 COMPLX) CAPS Take 1 capsule by mouth daily.   imatinib  (GLEEVEC) 100 MG tablet Take 3 tablets (300 mg total) by mouth daily. Take with meals and large glass of water.Caution:Chemotherapy   Loperamide HCl (IMODIUM PO) Take 0.5-1 tablets by mouth 2 (two) times daily as needed (takes 0.5 tablet every morning and 2nd dose if needed for diarrhea).    metoprolol tartrate (LOPRESSOR) 25 MG tablet Take 0.5 tablets (12.5 mg total) by mouth 2 (two) times daily.   Multiple Vitamin (MULTIVITAMIN) tablet Take 1 tablet by mouth daily.   Multiple Vitamins-Minerals (PRESERVISION AREDS 2) CAPS Take 1 capsule by mouth 2 (two) times a day.   Probiotic Product (ALIGN) 4 MG CAPS Take 1 capsule by mouth every other day.    No facility-administered encounter medications on file as of 04/27/2020.    Allergies (verified) Penicillins   History: Past Medical History:  Diagnosis Date   Anemia requiring transfusions 2010 & 2015   Arthritis    DJD   Breast cancer (HCC) 1993   S/P lumpectomy , radiation, & Tamoxifen   CML (chronic myeloid leukemia) (HCC)    Dr Bertis Ruddy;   SVT (supraventricular tachycardia) [facility])    dx june 2020 , magd by Dr Antoine Poche ; reports occasional heart flutter   Past Surgical History:  Procedure Laterality Date   ABDOMINAL HYSTERECTOMY  ~1989   no BSO, for Dysplasia    APPENDECTOMY  ~1989   BILATERAL SALPINGOOPHORECTOMY  2010   with bowel resection   BREAST SURGERY  Right lumpectomy, s/p radiation 1993 and oral  Tamoxifen x 5 years [phone])    CATARACT EXTRACTION  06/08/2014   left eye;Dr Elmer Picker   CATARACT EXTRACTION, BILATERAL Bilateral 2015   COLONOSCOPY  03/2003, last 2010   diverticulosis, Dr.Kaplan   EXCISION MORTON'S NEUROMA Left mid 80's   foot   gastrocslide     left leg; Dr Lestine Box   ileostomy reversal  2011   3 mos after above   KNEE SURGERY  mid 80's   Arthroscopy, right knee    LEG TENDON SURGERY Left 2013   "cut on leg and fixed something"   OPEN SURGICAL REPAIR OF GLUTEAL TENDON Left 10/24/2016   Procedure:  Left hip bursectomy; gluteal tendon repair;  Surgeon: Ollen Gross, MD;  Location: [facility];  Service: Orthopedics;  Laterality: Left;   sigmoid colon resection  2010    Dr Edwyna Shell, [place] for fistula & diverticulitis   TENDON REPAIR Left 2019   left shoulder tendon repair - surgical center    TOTAL HIP ARTHROPLASTY Left 03/02/2019   Procedure: TOTAL HIP ARTHROPLASTY ANTERIOR APPROACH;  Surgeon: Ollen Gross, MD;  Location: [facility];  Service: Orthopedics;  Laterality: Left;    TOTAL KNEE ARTHROPLASTY Right 06/11/2013   Procedure: RIGHT TOTAL KNEE ARTHROPLASTY;  Surgeon: Eugenia Mcalpine, MD;  Location: [facility];  Service: Orthopedics;  Laterality: Right;   TRIGGER FINGER RELEASE Left 2013   thumb   Family History  Problem Relation Age of Onset   Colon cancer Mother 51   Uterine cancer Sister    Diabetes Cousin    Cancer Father        Lip cancer   Aneurysm Father        AAA;S/P surgery   Breast cancer Paternal Aunt    Heart attack Paternal Uncle 55   Stroke Neg Hx    Esophageal cancer Neg Hx    Rectal cancer Neg Hx    Stomach cancer Neg Hx    Social History   Socioeconomic History   Marital status: Married    Spouse name: Not on file   Number of children: 2   Years of education: Not on file   Highest education level: Not on file  Occupational History   Occupation: retired Company secretary  Tobacco Use   Smoking status: Former Smoker    Years: 5.00    Types: Cigarettes    Quit date: 04/30/1962    Years since quitting: 58.0   Smokeless tobacco: Never Used   Tobacco comment: smoked 1956-1964, up to 1/2 ppd  Vaping Use   Vaping Use: Never used  Substance and Sexual Activity   Alcohol use: Yes    Alcohol/week: 7.0 - 14.0 standard drinks    Types: 7 - 14 Glasses of wine per week    Comment: 10-29 about 10 glasses of wine weekly    Drug use: No   Sexual activity: Yes  Other Topics Concern   Not on file  Social History Narrative   Married,    Social Determinants of  Health   Financial Resource Strain: Low Risk    Difficulty of Paying Living Expenses: Not hard at all  Food Insecurity: No Food Insecurity   Worried About Programme researcher, broadcasting/film/video in the Last Year: Never true   Barista in the Last Year: Never true  Transportation Needs: No Transportation Needs   Lack of Transportation (Medical): No   Lack of Transportation (Non-Medical): No  Physical Activity: Sufficiently Active  Days of Exercise per Week: 5 days   Minutes of Exercise per Session: 30 min  Stress: No Stress Concern Present   Feeling of Stress : Not at all  Social Connections: Socially Integrated   Frequency of Communication with Friends and Family: More than three times a week   Frequency of Social Gatherings with Friends and Family: Never   Attends Religious Services: More than 4 times per year   Active Member of [employer] or Organizations: No   Attends Music therapist: More than 4 times per year   Marital Status: Married    Tobacco Counseling Counseling given: Not Answered Comment: smoked 1956-1964, up to 1/2 ppd   Clinical Intake:  Pre-visit preparation completed: Yes  Pain : No/denies pain Pain Score: 0-No pain     BMI - recorded: 22.52 Nutritional Status: BMI <19  Underweight Nutritional Risks: None Diabetes: No  How often do you need to have someone help you when you read instructions, pamphlets, or other written materials from your doctor or pharmacy?: 1 - Never What is the last grade level you completed in school?: Master's Degree  Diabetic? no  Interpreter Needed?: No  Information entered by :: Lisette Abu, LPN   Activities of Daily Living In your present state of health, do you have any difficulty performing the following activities: 04/27/2020  Hearing? N  Vision? N  Difficulty concentrating or making decisions? N  Walking or climbing stairs? N  Dressing or bathing? N  Doing errands, shopping? N  Preparing Food and eating ? N   Using the Toilet? N  In the past six months, have you accidently leaked urine? Y  Do you have problems with loss of bowel control? Y  Managing your Medications? N  Managing your Finances? N  Housekeeping or managing your Housekeeping? N  Some recent data might be hidden    Patient Care Team: Binnie Rail, MD as PCP - General (Internal Medicine) Minus Breeding, MD as PCP - Cardiology (Cardiology) Gaynelle Arabian, MD as Consulting Physician (Orthopedic Surgery) Heath Lark, MD as Consulting Physician (Hematology and Oncology) Monna Fam, MD as Consulting Physician (Ophthalmology)  Indicate any recent Medical Services you may have received from other than [facility] providers in the past year (date may be approximate).     Assessment:   This is a routine wellness examination for [place].  Hearing/Vision screen No exam data present  Dietary issues and exercise activities discussed: Current Exercise Habits: Home exercise routine, Type of exercise: walking, Time (Minutes): 30, Frequency (Times/Week): 5, Weekly Exercise (Minutes/Week): 150, Intensity: Mild, Exercise limited by: orthopedic condition(s)  Goals      Patient Stated     Continue to gradually increase the amount that I walk routinely until I reach the distance I was walking prior to having hip surgery.        Depression Screen PHQ 2/9 Scores 04/27/2020 03/31/2019 02/26/2017 02/17/2016 08/06/2014 07/27/2013 07/18/2012  PHQ - 2 Score 0 0 0 0 0 0 0    Fall Risk Fall Risk  04/27/2020 03/31/2019 02/26/2017 02/17/2016 08/06/2014  Falls in the past year? 0 1 No No No  Number falls in past yr: 0 0 - - -  Injury with Fall? 0 0 - - -  Risk for fall due to : No Fall Risks History of fall(s) - - -  Follow up Falls evaluation completed;Education provided Falls prevention discussed - - -    FALL RISK PREVENTION PERTAINING TO THE HOME:  Any stairs in  or around the home? No  If so, are there any without handrails? No  Home free of  loose throw rugs in walkways, pet beds, electrical cords, etc? Yes  Adequate lighting in your home to reduce risk of falls? Yes   ASSISTIVE DEVICES UTILIZED TO PREVENT FALLS:  Life alert? No  Use of a cane, walker or w/c? No  Grab bars in the bathroom? No  Shower chair or bench in shower? No  Elevated toilet seat or a handicapped toilet? No   TIMED UP AND GO:  Was the test performed? No .  Length of time to ambulate 10 feet: 0 sec.   Gait steady and fast without use of assistive device  Cognitive Function: Normal cognitive status assessed by direct observation by this Nurse Health Advisor. No abnormalities found.         Immunizations Immunization History  Administered Date(s) Administered   Fluad Quad(high Dose 65+) 02/06/2019, 02/01/2020   Influenza Split 02/17/2015   Influenza Whole 02/29/2012   Influenza, High Dose Seasonal PF 02/17/2016   Influenza,inj,Quad PF,6+ Mos 04/15/2018   Influenza,inj,quad, With Preservative 01/28/2017   Influenza-Unspecified 03/14/2013, 01/19/2014, 01/17/2017   PFIZER SARS-COV-2 Vaccination 06/07/2019, 07/02/2019   Pneumococcal Conjugate-13 03/01/2015   Pneumococcal Polysaccharide-23 03/30/2002, 09/29/2002, 03/11/2012   Td 04/30/2006    TDAP status: Due, Education has been provided regarding the importance of this vaccine. Advised may receive this vaccine at local pharmacy or Health Dept. Aware to provide a copy of the vaccination record if obtained from local pharmacy or Health Dept. Verbalized acceptance and understanding.  Flu Vaccine status: Up to date  Pneumococcal vaccine status: Up to date  Covid-19 vaccine status: Completed vaccines  Qualifies for Shingles Vaccine? Yes   Zostavax completed No   Shingrix Completed?: No.    Education has been provided regarding the importance of this vaccine. Patient has been advised to call insurance company to determine out of pocket expense if they have not yet received this vaccine. Advised  may also receive vaccine at local pharmacy or Health Dept. Verbalized acceptance and understanding.  Screening Tests Health Maintenance  Topic Date Due   COVID-19 Vaccine (3 - Pfizer risk 4-dose series) 07/30/2019   DEXA SCAN  02/19/2020   TETANUS/TDAP  08/24/2020 (Originally 04/30/2016)   PNA vac Low Risk Adult  Completed    Health Maintenance  Health Maintenance Due  Topic Date Due   COVID-19 Vaccine (3 - Pfizer risk 4-dose series) 07/30/2019   DEXA SCAN  02/19/2020    Colorectal cancer screening: No longer required.   Mammogram status: Completed 06/23/2019. Repeat every year  Bone Density status: Completed 02/18/2018. Results reflect: Bone density results: OSTEOPENIA. Repeat every 2 years.  Lung Cancer Screening: (Low Dose CT Chest recommended if Age 54-80 years, 30 pack-year currently smoking OR have quit w/in 15years.) does not qualify.   Lung Cancer Screening Referral: no  Additional Screening:  Hepatitis C Screening: does not qualify; Completed no  Vision Screening: Recommended annual ophthalmology exams for early detection of glaucoma and other disorders of the eye. Is the patient up to date with their annual eye exam?  Yes  Who is the provider or what is the name of the office in which the patient attends annual eye exams? Modena NunneryKathyrn Hecker, MD If pt is not established with a provider, would they like to be referred to a provider to establish care? No .   Dental Screening: Recommended annual dental exams for proper oral hygiene  Community Resource Referral /  Chronic Care Management: CRR required this visit?  No   CCM required this visit?  No      Plan:     I have personally reviewed and noted the following in the patient's chart:   Medical and social history Use of alcohol, tobacco or illicit drugs  Current medications and supplements Functional ability and status Nutritional status Physical activity Advanced directives List of other  physicians Hospitalizations, surgeries, and ER visits in previous 12 months Vitals Screenings to include cognitive, depression, and falls Referrals and appointments  In addition, I have reviewed and discussed with patient certain preventive protocols, quality metrics, and best practice recommendations. A written personalized care plan for preventive services as well as general preventive health recommendations were provided to patient.     Sheral Flow, LPN   [id number]   Nurse Notes: n/a   Medical screening examination/treatment/procedure(s) were performed by non-physician practitioner and as supervising physician I was immediately available for consultation/collaboration.  I agree with above. Lew Dawes, MD

## 2020-04-27 NOTE — Patient Instructions (Signed)
[name] [name] , Thank you for taking time to come for your Medicare Wellness Visit. I appreciate your ongoing commitment to your health goals. Please review the following plan we discussed and let me know if I can assist you in the future.   Screening recommendations/referrals: Colonoscopy: no repeat due to age Mammogram: 06/23/2019 Bone Density: 02/18/2018; due every 2 years Recommended yearly ophthalmology/optometry visit for glaucoma screening and checkup Recommended yearly dental visit for hygiene and checkup  Vaccinations: Influenza vaccine: 02/01/2020 Pneumococcal vaccine: up to date Tdap vaccine: overtime Shingles vaccine: never done   Covid-19:  Up to date  Advanced directives: Please bring a copy of your health care power of attorney and living will to the office at your convenience.  Conditions/risks identified: Yes; Reviewed health maintenance screenings with patient today and relevant education, vaccines, and/or referrals were provided. Please continue to do your personal lifestyle choices by: daily care of teeth and gums, regular physical activity (goal should be 5 days a week for 30 minutes), eat a healthy diet, avoid tobacco and drug use, limiting any alcohol intake, taking a low-dose aspirin (if not allergic or have been advised by your provider otherwise) and taking vitamins and minerals as recommended by your provider. Continue doing brain stimulating activities (puzzles, reading, adult coloring books, staying active) to keep memory sharp. Continue to eat heart healthy diet (full of fruits, vegetables, whole grains, lean protein, water--limit salt, fat, and sugar intake) and increase physical activity as tolerated.   Next appointment: Please schedule your next Medicare Wellness Visit with your Nurse Health Advisor in 1 year by calling [phone].   Preventive Care 28 Years and Older, Female Preventive care refers to lifestyle choices and visits with your health care provider  that can promote health and wellness. What does preventive care include?  A yearly physical exam. This is also called an annual well check.  Dental exams once or twice a year.  Routine eye exams. Ask your health care provider how often you should have your eyes checked.  Personal lifestyle choices, including:  Daily care of your teeth and gums.  Regular physical activity.  Eating a healthy diet.  Avoiding tobacco and drug use.  Limiting alcohol use.  Practicing safe sex.  Taking low-dose aspirin every day.  Taking vitamin and mineral supplements as recommended by your health care provider. What happens during an annual well check? The services and screenings done by your health care provider during your annual well check will depend on your age, overall health, lifestyle risk factors, and family history of disease. Counseling  Your health care provider may ask you questions about your:  Alcohol use.  Tobacco use.  Drug use.  Emotional well-being.  Home and relationship well-being.  Sexual activity.  Eating habits.  History of falls.  Memory and ability to understand (cognition).  Work and work Statistician.  Reproductive health. Screening  You may have the following tests or measurements:  Height, weight, and BMI.  Blood pressure.  Lipid and cholesterol levels. These may be checked every 5 years, or more frequently if you are over 58 years old.  Skin check.  Lung cancer screening. You may have this screening every year starting at age 39 if you have a 30-pack-year history of smoking and currently smoke or have quit within the past 15 years.  Fecal occult blood test (FOBT) of the stool. You may have this test every year starting at age 78.  Flexible sigmoidoscopy or colonoscopy. You may have a sigmoidoscopy  every 5 years or a colonoscopy every 10 years starting at age 71.  Hepatitis C blood test.  Hepatitis B blood test.  Sexually transmitted  disease (STD) testing.  Diabetes screening. This is done by checking your blood sugar (glucose) after you have not eaten for a while (fasting). You may have this done every 1-3 years.  Bone density scan. This is done to screen for osteoporosis. You may have this done starting at age 66.  Mammogram. This may be done every 1-2 years. Talk to your health care provider about how often you should have regular mammograms. Talk with your health care provider about your test results, treatment options, and if necessary, the need for more tests. Vaccines  Your health care provider may recommend certain vaccines, such as:  Influenza vaccine. This is recommended every year.  Tetanus, diphtheria, and acellular pertussis (Tdap, Td) vaccine. You may need a Td booster every 10 years.  Zoster vaccine. You may need this after age 62.  Pneumococcal 13-valent conjugate (PCV13) vaccine. One dose is recommended after age 26.  Pneumococcal polysaccharide (PPSV23) vaccine. One dose is recommended after age 81. Talk to your health care provider about which screenings and vaccines you need and how often you need them. This information is not intended to replace advice given to you by your health care provider. Make sure you discuss any questions you have with your health care provider. Document Released: 05/13/2015 Document Revised: 01/04/2016 Document Reviewed: 02/15/2015 Elsevier Interactive Patient Education  2017 [employer].  Fall Prevention in the Home Falls can cause injuries. They can happen to people of all ages. There are many things you can do to make your home safe and to help prevent falls. What can I do on the outside of my home?  Regularly fix the edges of walkways and driveways and fix any cracks.  Remove anything that might make you trip as you walk through a door, such as a raised step or threshold.  Trim any bushes or trees on the path to your home.  Use bright outdoor  lighting.  Clear any walking paths of anything that might make someone trip, such as rocks or tools.  Regularly check to see if handrails are loose or broken. Make sure that both sides of any steps have handrails.  Any raised decks and porches should have guardrails on the edges.  Have any leaves, snow, or ice cleared regularly.  Use sand or salt on walking paths during winter.  Clean up any spills in your garage right away. This includes oil or grease spills. What can I do in the bathroom?  Use night lights.  Install grab bars by the toilet and in the tub and shower. Do not use towel bars as grab bars.  Use non-skid mats or decals in the tub or shower.  If you need to sit down in the shower, use a plastic, non-slip stool.  Keep the floor dry. Clean up any water that spills on the floor as soon as it happens.  Remove soap buildup in the tub or shower regularly.  Attach bath mats securely with double-sided non-slip rug tape.  Do not have throw rugs and other things on the floor that can make you trip. What can I do in the bedroom?  Use night lights.  Make sure that you have a light by your bed that is easy to reach.  Do not use any sheets or blankets that are too big for your bed. They should  not hang down onto the floor.  Have a firm chair that has side arms. You can use this for support while you get dressed.  Do not have throw rugs and other things on the floor that can make you trip. What can I do in the kitchen?  Clean up any spills right away.  Avoid walking on wet floors.  Keep items that you use a lot in easy-to-reach places.  If you need to reach something above you, use a strong step stool that has a grab bar.  Keep electrical cords out of the way.  Do not use floor polish or wax that makes floors slippery. If you must use wax, use non-skid floor wax.  Do not have throw rugs and other things on the floor that can make you trip. What can I do with my  stairs?  Do not leave any items on the stairs.  Make sure that there are handrails on both sides of the stairs and use them. Fix handrails that are broken or loose. Make sure that handrails are as long as the stairways.  Check any carpeting to make sure that it is firmly attached to the stairs. Fix any carpet that is loose or worn.  Avoid having throw rugs at the top or bottom of the stairs. If you do have throw rugs, attach them to the floor with carpet tape.  Make sure that you have a light switch at the top of the stairs and the bottom of the stairs. If you do not have them, ask someone to add them for you. What else can I do to help prevent falls?  Wear shoes that:  Do not have high heels.  Have rubber bottoms.  Are comfortable and fit you well.  Are closed at the toe. Do not wear sandals.  If you use a stepladder:  Make sure that it is fully opened. Do not climb a closed stepladder.  Make sure that both sides of the stepladder are locked into place.  Ask someone to hold it for you, if possible.  Clearly mark and make sure that you can see:  Any grab bars or handrails.  First and last steps.  Where the edge of each step is.  Use tools that help you move around (mobility aids) if they are needed. These include:  Canes.  Walkers.  Scooters.  Crutches.  Turn on the lights when you go into a dark area. Replace any light bulbs as soon as they burn out.  Set up your furniture so you have a clear path. Avoid moving your furniture around.  If any of your floors are uneven, fix them.  If there are any pets around you, be aware of where they are.  Review your medicines with your doctor. Some medicines can make you feel dizzy. This can increase your chance of falling. Ask your doctor what other things that you can do to help prevent falls. This information is not intended to replace advice given to you by your health care provider. Make sure you discuss any  questions you have with your health care provider. Document Released: 02/10/2009 Document Revised: 09/22/2015 Document Reviewed: 05/21/2014 Elsevier Interactive Patient Education  2017 [employer].

## 2020-05-30 ENCOUNTER — Telehealth: Payer: Self-pay | Admitting: Cardiology

## 2020-05-30 NOTE — Telephone Encounter (Signed)
OK to stop.

## 2020-05-30 NOTE — Telephone Encounter (Signed)
Spoke with patient and relayed Dr. Rosezella [place] message that it is okay to discontinue Metoprolol. Medication removed from active med list.

## 2020-05-30 NOTE — Telephone Encounter (Signed)
Patient calling to see if she can stop taking her metoprolol. She reports no reoccurrence of palpitations. She reports Dr. Percival Spanish mentioned stopping the metoprolol at her last visit but they cut it in half instead. She would like to stop the medication at this time. Will route to MD.

## 2020-05-30 NOTE — Telephone Encounter (Signed)
Pt called in and stated this med was cut in half and would like to know if now she can just stop taking it.  She no longer wants to take this med and would like Dr Percival Spanish recommendation  Best number [phone]

## 2020-06-17 ENCOUNTER — Encounter: Payer: Self-pay | Admitting: Internal Medicine

## 2020-06-17 DIAGNOSIS — H43813 Vitreous degeneration, bilateral: Secondary | ICD-10-CM | POA: Diagnosis not present

## 2020-06-17 DIAGNOSIS — H26491 Other secondary cataract, right eye: Secondary | ICD-10-CM | POA: Diagnosis not present

## 2020-06-17 DIAGNOSIS — H35363 Drusen (degenerative) of macula, bilateral: Secondary | ICD-10-CM | POA: Diagnosis not present

## 2020-06-17 DIAGNOSIS — H353132 Nonexudative age-related macular degeneration, bilateral, intermediate dry stage: Secondary | ICD-10-CM | POA: Diagnosis not present

## 2020-06-17 DIAGNOSIS — Z973 Presence of spectacles and contact lenses: Secondary | ICD-10-CM | POA: Diagnosis not present

## 2020-06-23 DIAGNOSIS — Z1231 Encounter for screening mammogram for malignant neoplasm of breast: Secondary | ICD-10-CM | POA: Diagnosis not present

## 2020-08-01 ENCOUNTER — Other Ambulatory Visit: Payer: Self-pay

## 2020-08-01 ENCOUNTER — Inpatient Hospital Stay: Payer: Medicare PPO | Attending: Hematology and Oncology

## 2020-08-01 DIAGNOSIS — D2262 Melanocytic nevi of left upper limb, including shoulder: Secondary | ICD-10-CM | POA: Diagnosis not present

## 2020-08-01 DIAGNOSIS — D225 Melanocytic nevi of trunk: Secondary | ICD-10-CM | POA: Diagnosis not present

## 2020-08-01 DIAGNOSIS — D638 Anemia in other chronic diseases classified elsewhere: Secondary | ICD-10-CM | POA: Insufficient documentation

## 2020-08-01 DIAGNOSIS — K130 Diseases of lips: Secondary | ICD-10-CM | POA: Diagnosis not present

## 2020-08-01 DIAGNOSIS — C9210 Chronic myeloid leukemia, BCR/ABL-positive, not having achieved remission: Secondary | ICD-10-CM | POA: Insufficient documentation

## 2020-08-01 DIAGNOSIS — N183 Chronic kidney disease, stage 3 unspecified: Secondary | ICD-10-CM | POA: Insufficient documentation

## 2020-08-01 DIAGNOSIS — D6959 Other secondary thrombocytopenia: Secondary | ICD-10-CM | POA: Insufficient documentation

## 2020-08-01 DIAGNOSIS — Z79899 Other long term (current) drug therapy: Secondary | ICD-10-CM | POA: Diagnosis not present

## 2020-08-01 DIAGNOSIS — L814 Other melanin hyperpigmentation: Secondary | ICD-10-CM | POA: Diagnosis not present

## 2020-08-01 DIAGNOSIS — D1801 Hemangioma of skin and subcutaneous tissue: Secondary | ICD-10-CM | POA: Diagnosis not present

## 2020-08-01 LAB — CBC WITH DIFFERENTIAL/PLATELET
Abs Immature Granulocytes: 0.01 10*3/uL (ref 0.00–0.07)
Basophils Absolute: 0 10*3/uL (ref 0.0–0.1)
Basophils Relative: 1 %
Eosinophils Absolute: 0.1 10*3/uL (ref 0.0–0.5)
Eosinophils Relative: 2 %
HCT: 29.8 % — ABNORMAL LOW (ref 36.0–46.0)
Hemoglobin: 10.2 g/dL — ABNORMAL LOW (ref 12.0–15.0)
Immature Granulocytes: 0 %
Lymphocytes Relative: 25 %
Lymphs Abs: 1.1 10*3/uL (ref 0.7–4.0)
MCH: 38.9 pg — ABNORMAL HIGH (ref 26.0–34.0)
MCHC: 34.2 g/dL (ref 30.0–36.0)
MCV: 113.7 fL — ABNORMAL HIGH (ref 80.0–100.0)
Monocytes Absolute: 0.6 10*3/uL (ref 0.1–1.0)
Monocytes Relative: 13 %
Neutro Abs: 2.7 10*3/uL (ref 1.7–7.7)
Neutrophils Relative %: 59 %
Platelets: 131 10*3/uL — ABNORMAL LOW (ref 150–400)
RBC: 2.62 MIL/uL — ABNORMAL LOW (ref 3.87–5.11)
RDW: 13.4 % (ref 11.5–15.5)
WBC: 4.5 10*3/uL (ref 4.0–10.5)
nRBC: 0 % (ref 0.0–0.2)

## 2020-08-01 LAB — COMPREHENSIVE METABOLIC PANEL
ALT: 19 U/L (ref 0–44)
AST: 40 U/L (ref 15–41)
Albumin: 4.1 g/dL (ref 3.5–5.0)
Alkaline Phosphatase: 54 U/L (ref 38–126)
Anion gap: 12 (ref 5–15)
BUN: 23 mg/dL (ref 8–23)
CO2: 27 mmol/L (ref 22–32)
Calcium: 9.1 mg/dL (ref 8.9–10.3)
Chloride: 100 mmol/L (ref 98–111)
Creatinine, Ser: 1.27 mg/dL — ABNORMAL HIGH (ref 0.44–1.00)
GFR, Estimated: 42 mL/min — ABNORMAL LOW (ref >60)
Glucose, Bld: 104 mg/dL — ABNORMAL HIGH (ref 70–99)
Potassium: 4.4 mmol/L (ref 3.5–5.1)
Sodium: 139 mmol/L (ref 135–145)
Total Bilirubin: 0.5 mg/dL (ref 0.3–1.2)
Total Protein: 6.8 g/dL (ref 6.5–8.1)

## 2020-08-08 LAB — BCR/ABL

## 2020-08-11 ENCOUNTER — Inpatient Hospital Stay: Payer: Medicare PPO | Admitting: Hematology and Oncology

## 2020-08-11 ENCOUNTER — Encounter: Payer: Self-pay | Admitting: Hematology and Oncology

## 2020-08-11 ENCOUNTER — Other Ambulatory Visit: Payer: Self-pay

## 2020-08-11 DIAGNOSIS — D638 Anemia in other chronic diseases classified elsewhere: Secondary | ICD-10-CM | POA: Diagnosis not present

## 2020-08-11 DIAGNOSIS — C9210 Chronic myeloid leukemia, BCR/ABL-positive, not having achieved remission: Secondary | ICD-10-CM

## 2020-08-11 DIAGNOSIS — D6959 Other secondary thrombocytopenia: Secondary | ICD-10-CM | POA: Diagnosis not present

## 2020-08-11 DIAGNOSIS — T50905A Adverse effect of unspecified drugs, medicaments and biological substances, initial encounter: Secondary | ICD-10-CM

## 2020-08-11 DIAGNOSIS — N183 Chronic kidney disease, stage 3 unspecified: Secondary | ICD-10-CM | POA: Diagnosis not present

## 2020-08-11 DIAGNOSIS — Z79899 Other long term (current) drug therapy: Secondary | ICD-10-CM | POA: Diagnosis not present

## 2020-08-11 NOTE — Assessment & Plan Note (Signed)
Her serum creatinine fluctuated a little bit She is not symptomatic Observe for now 

## 2020-08-11 NOTE — Assessment & Plan Note (Signed)
The cause of her anemia is multifactorial, secondary to anemia chronic kidney disease and her treatment She is not symptomatic Observe closely She will continue folic acid and vitamin B12 supplement

## 2020-08-11 NOTE — Progress Notes (Signed)
[facility] OFFICE PROGRESS NOTE  Patient Care Team: Binnie Rail, MD as PCP - General (Internal Medicine) Minus Breeding, MD as PCP - Cardiology (Cardiology) Gaynelle Arabian, MD as Consulting Physician (Orthopedic Surgery) Heath Lark, MD as Consulting Physician (Hematology and Oncology) Monna Fam, MD as Consulting Physician (Ophthalmology)  ASSESSMENT & PLAN:  Chronic myeloid leukemia Her blood count is stable So far, she tolerated treatment well without recent diarrhea, fluid retention or shortness of breath She has mild pancytopenia with treatment but she is not symptomatic BCR/ABL is not detected and she remained in major molecular response We will continue treatment indefinitely I will schedule return appointment in 6 months  Thrombocytopenia due to drugs This is likely due to recent treatment. The patient denies recent history of bleeding such as epistaxis, hematuria or hematochezia. She is asymptomatic from the low platelet count. I will observe for now.  she does not require transfusion now. I will continue the chemotherapy at current dose without dosage adjustment.  If the thrombocytopenia gets progressive worse in the future, I might have to delay her treatment or adjust the chemotherapy dose.    CKD (chronic kidney disease), stage III Her serum creatinine fluctuated a little bit She is not symptomatic Observe for now  Anemia, chronic disease The cause of her anemia is multifactorial, secondary to anemia chronic kidney disease and her treatment She is not symptomatic Observe closely She will continue folic acid and vitamin B12 supplement   No orders of the defined types were placed in this encounter.   All questions were answered. The patient knows to call the clinic with any problems, questions or concerns. The total time spent in the appointment was 20 minutes encounter with patients including review of chart and various tests results,  discussions about plan of care and coordination of care plan   Heath Lark, MD 08/11/2020 11:26 AM  INTERVAL HISTORY: Please see below for problem oriented charting. She returns with her husband for further follow-up She is doing well No recent infection, fever or chills No recent bleeding No evidence of pleural effusion, shortness of breath or fluid retention  SUMMARY OF ONCOLOGIC HISTORY: Oncology History  Chronic myeloid leukemia ([place])  02/18/2007 Initial Diagnosis   Chronic myeloid leukemia   08/20/2014 Tumor Marker   BCR/ABL is undetectable   03/04/2015 Pathology Results   BCR/ABL is undetectable   08/29/2015 Tumor Marker   BCR/ABL is undetectable   02/29/2016 Pathology Results   BCR/ABL is undetectable   05/01/2016 Pathology Results   BCR/ABL b2a2 is detected at 0.03% and IS ratio 0.069%   05/31/2016 Pathology Results   BCR/ABL b2a2 is detected at 0.12% and IS ratio 0.276%   06/19/2016 Pathology Results   BCR/ABL b2a2 is detected at 0.24% and IS ratio 0.552%   06/19/2016 Miscellaneous   ABL mutation kinase testing is negative   06/27/2016 Miscellaneous   Dose of Gleevec is increased to 600 mg daily   07/25/2016 Pathology Results   BCR/ABL b2a2 is detected at 0.03% and IS ratio 0.069%. She has achieved MMR   08/30/2016 Miscellaneous   Dose of Gleevec is reduced to 300 mg daily and subsequently down to 200 mg daily   10/22/2016 Pathology Results   BCR/ABL b2a2 is not detected. She has achieved MMR   01/03/2017 Pathology Results   BCR/ABL b2a2 is detected IS ratio 0.041%   02/18/2017 Pathology Results   BCR/ABL b2a2 is detected IS ratio 0.0126   05/21/2017 Pathology Results   BCR/ABL  b2a2 is not detected. She has achieved MMR   08/20/2017 Pathology Results   BCR/ABL b2a2 is not detected. She has achieved MMR   11/12/2017 Pathology Results   BCR/ABL b2a2 is not detected   04/07/2018 Pathology Results   BCR/ABL b2a2 is detected IS ratio 0.0062. She is in MMR    06/27/2018 Pathology Results   BCR/ABL b2a2 is detected IS ratio 0.0097%. She is in MMR   08/29/2018 Pathology Results   BCR/ABL is undetectable. She is in MMR   12/26/2018 Pathology Results   BCR/ABL b2a2 is detected IS ratio 0.0042%. She is in MMR   05/04/2019 Pathology Results   BCR/ABL b2a2 is detected IS ratio 0.0575%. She is in MMR   08/03/2019 Pathology Results   BCR/ABL b2a2 is detected IS ratio 0.0167%. She is in MMR   02/01/2020 Pathology Results   BCR/ABL is undetectable   08/01/2020 Pathology Results   BCR/ABL is undetectable     REVIEW OF SYSTEMS:   Constitutional: Denies fevers, chills or abnormal weight loss Eyes: Denies blurriness of vision Ears, nose, mouth, throat, and face: Denies mucositis or sore throat Respiratory: Denies cough, dyspnea or wheezes Cardiovascular: Denies palpitation, chest discomfort or lower extremity swelling Gastrointestinal:  Denies nausea, heartburn or change in bowel habits Skin: Denies abnormal skin rashes Lymphatics: Denies new lymphadenopathy or easy bruising Neurological:Denies numbness, tingling or new weaknesses Behavioral/Psych: Mood is stable, no new changes  All other systems were reviewed with the patient and are negative.  I have reviewed the past medical history, past surgical history, social history and family history with the patient and they are unchanged from previous note.  ALLERGIES:  is allergic to penicillins.  MEDICATIONS:  Current Outpatient Medications  Medication Sig Dispense Refill  . cholecalciferol (VITAMIN D3) 25 MCG (1000 UT) tablet Take 1,000 Units by mouth daily.    . Cyanocobalamin (B-12) 5000 MCG CAPS Take 1 tablet by mouth daily.    Marland Kitchen estradiol (ESTRACE) 0.1 MG/GM vaginal cream INSERT 1 APPLICATORFUL VAGINALLY 2 TIMES WEEKLY 42.5 g 0  . FINACEA 15 % cream Apply 1 application topically daily.  2  . folic acid (FOLVITE) 1 MG tablet TAKE 1 TABLET(1 MG) BY MOUTH DAILY. 90 tablet 3  .  Glucosamine-Chondroit-Vit C-Mn (GLUCOSAMINE CHONDR 1500 COMPLX) CAPS Take 1 capsule by mouth daily.    Marland Kitchen imatinib (GLEEVEC) 100 MG tablet Take 3 tablets (300 mg total) by mouth daily. Take with meals and large glass of water.Caution:Chemotherapy 270 tablet 11  . Loperamide HCl (IMODIUM PO) Take 0.5-1 tablets by mouth 2 (two) times daily as needed (takes 0.5 tablet every morning and 2nd dose if needed for diarrhea).     . Multiple Vitamin (MULTIVITAMIN) tablet Take 1 tablet by mouth daily.    . Multiple Vitamins-Minerals (PRESERVISION AREDS 2) CAPS Take 1 capsule by mouth 2 (two) times a day.    . Probiotic Product (ALIGN) 4 MG CAPS Take 1 capsule by mouth every other day.      No current facility-administered medications for this visit.    PHYSICAL EXAMINATION: ECOG PERFORMANCE STATUS: 1 - Symptomatic but completely ambulatory  Vitals:   08/11/20 1036  BP: 122/70  Pulse: 88  Resp: 18  Temp: 98.5 F (36.9 C)  SpO2: 96%   Filed Weights   08/11/20 1036  Weight: 118 lb 3.2 oz (53.6 kg)    GENERAL:alert, no distress and comfortable SKIN: skin color, texture, turgor are normal, no rashes or significant lesions EYES: normal, Conjunctiva are pink  and non-injected, sclera clear OROPHARYNX:no exudate, no erythema and lips, buccal mucosa, and tongue normal  NECK: supple, thyroid normal size, non-tender, without nodularity LYMPH:  no palpable lymphadenopathy in the cervical, axillary or inguinal LUNGS: clear to auscultation and percussion with normal breathing effort HEART: regular rate & rhythm and no murmurs and no lower extremity edema ABDOMEN:abdomen soft, non-tender and normal bowel sounds Musculoskeletal:no cyanosis of digits and no clubbing  NEURO: alert & oriented x 3 with fluent speech, no focal motor/sensory deficits  LABORATORY DATA:  I have reviewed the data as listed    Component Value Date/Time   NA 139 08/01/2020 1038   NA 140 02/18/2017 0808   K 4.4 08/01/2020 1038    K 4.4 02/18/2017 0808   CL 100 08/01/2020 1038   CL 105 09/04/2012 1006   CO2 27 08/01/2020 1038   CO2 29 02/18/2017 0808   GLUCOSE 104 (H) 08/01/2020 1038   GLUCOSE 86 02/18/2017 0808   GLUCOSE 96 09/04/2012 1006   BUN 23 08/01/2020 1038   BUN 20.3 02/18/2017 0808   CREATININE 1.27 (H) 08/01/2020 1038   CREATININE 0.93 05/04/2019 1108   CREATININE 1.1 02/18/2017 0808   CALCIUM 9.1 08/01/2020 1038   CALCIUM 9.4 02/18/2017 0808   PROT 6.8 08/01/2020 1038   PROT 6.6 02/18/2017 0808   ALBUMIN 4.1 08/01/2020 1038   ALBUMIN 4.2 02/18/2017 0808   AST 40 08/01/2020 1038   AST 34 05/04/2019 1108   AST 34 02/18/2017 0808   ALT 19 08/01/2020 1038   ALT 19 05/04/2019 1108   ALT 18 02/18/2017 0808   ALKPHOS 54 08/01/2020 1038   ALKPHOS 53 02/18/2017 0808   BILITOT 0.5 08/01/2020 1038   BILITOT 0.9 05/04/2019 1108   BILITOT 0.76 02/18/2017 0808   GFRNONAA 42 (L) 08/01/2020 1038   GFRNONAA 57 (L) 05/04/2019 1108   GFRAA 45 (L) 02/01/2020 1023   GFRAA >60 05/04/2019 1108    No results found for: SPEP, UPEP  Lab Results  Component Value Date   WBC 4.5 08/01/2020   NEUTROABS 2.7 08/01/2020   HGB 10.2 (L) 08/01/2020   HCT 29.8 (L) 08/01/2020   MCV 113.7 (H) 08/01/2020   PLT 131 (L) 08/01/2020      Chemistry      Component Value Date/Time   NA 139 08/01/2020 1038   NA 140 02/18/2017 0808   K 4.4 08/01/2020 1038   K 4.4 02/18/2017 0808   CL 100 08/01/2020 1038   CL 105 09/04/2012 1006   CO2 27 08/01/2020 1038   CO2 29 02/18/2017 0808   BUN 23 08/01/2020 1038   BUN 20.3 02/18/2017 0808   CREATININE 1.27 (H) 08/01/2020 1038   CREATININE 0.93 05/04/2019 1108   CREATININE 1.1 02/18/2017 0808      Component Value Date/Time   CALCIUM 9.1 08/01/2020 1038   CALCIUM 9.4 02/18/2017 0808   ALKPHOS 54 08/01/2020 1038   ALKPHOS 53 02/18/2017 0808   AST 40 08/01/2020 1038   AST 34 05/04/2019 1108   AST 34 02/18/2017 0808   ALT 19 08/01/2020 1038   ALT 19 05/04/2019 1108   ALT  18 02/18/2017 0808   BILITOT 0.5 08/01/2020 1038   BILITOT 0.9 05/04/2019 1108   BILITOT 0.76 02/18/2017 4193

## 2020-08-11 NOTE — Assessment & Plan Note (Signed)
Her blood count is stable So far, she tolerated treatment well without recent diarrhea, fluid retention or shortness of breath She has mild pancytopenia with treatment but she is not symptomatic BCR/ABL is not detected and she remained in major molecular response We will continue treatment indefinitely I will schedule return appointment in 6 months 

## 2020-08-11 NOTE — Assessment & Plan Note (Signed)
This is likely due to recent treatment. The patient denies recent history of bleeding such as epistaxis, hematuria or hematochezia. She is asymptomatic from the low platelet count. I will observe for now.  she does not require transfusion now. I will continue the chemotherapy at current dose without dosage adjustment.  If the thrombocytopenia gets progressive worse in the future, I might have to delay her treatment or adjust the chemotherapy dose.   

## 2020-08-18 ENCOUNTER — Other Ambulatory Visit: Payer: Self-pay | Admitting: Hematology and Oncology

## 2020-08-18 DIAGNOSIS — L900 Lichen sclerosus et atrophicus: Secondary | ICD-10-CM | POA: Diagnosis not present

## 2020-08-18 DIAGNOSIS — C9210 Chronic myeloid leukemia, BCR/ABL-positive, not having achieved remission: Secondary | ICD-10-CM

## 2020-08-30 DIAGNOSIS — I471 Supraventricular tachycardia, unspecified: Secondary | ICD-10-CM | POA: Insufficient documentation

## 2020-08-30 NOTE — Progress Notes (Signed)
Cardiology Office Note   Date:  08/31/2020   ID:  [name] [name], DOB [birth year], MRN [id number]  PCP:  Binnie Rail, MD  Cardiologist:   Minus Breeding, MD  Chief Complaint  Patient presents with  . SVT      History of Present Illness: [name] [name] is a 86 y.o. female who presents for evaluation of palpitations.  She wore a monitor and she had rare SVT.  I added beta blocker.  Since I last saw her she was weaned off of her beta-blocker and really has not had much in the way of palpitations other than perhaps 1 fleeting event.  She walks routinely for exercise.  The patient denies any new symptoms such as chest discomfort, neck or arm discomfort. There has been no new shortness of breath, PND or orthopnea. There have been no reported palpitations, presyncope or syncope.  She and her husband headed up to [place] to her [place] soon.  Past Medical History:  Diagnosis Date  . Anemia requiring transfusions 2010 & 2015  . Arthritis    DJD  . Breast cancer ([name] [name]) 1993   S/P lumpectomy , radiation, & Tamoxifen  . CML (chronic myeloid leukemia) ([facility])    Dr Alvy Bimler;  . SVT (supraventricular tachycardia) [facility]    dx june 2020 , magd by Dr Percival Spanish ; reports occasional heart flutter    Past Surgical History:  Procedure Laterality Date  . ABDOMINAL HYSTERECTOMY  ~1989   no BSO, for Dysplasia   . APPENDECTOMY  ~1989  . BILATERAL SALPINGOOPHORECTOMY  2010   with bowel resection  . BREAST SURGERY     Right lumpectomy, s/p radiation 1993 and oral  Tamoxifen x 5 years ([id number])   . CATARACT EXTRACTION  06/08/2014   left eye;Dr Herbert Deaner  . CATARACT EXTRACTION, BILATERAL Bilateral 2015  . COLONOSCOPY  03/2003, last 2010   diverticulosis, Dr.Kaplan  . EXCISION MORTON'S NEUROMA Left mid 80's   foot  . gastrocslide     left leg; Dr Beola Cord  . ileostomy reversal  2011   3 mos after above  . KNEE SURGERY  mid 80's   Arthroscopy, right knee   . LEG TENDON SURGERY Left 2013    "cut on leg and fixed something"  . OPEN SURGICAL REPAIR OF GLUTEAL TENDON Left 10/24/2016   Procedure: Left hip bursectomy; gluteal tendon repair;  Surgeon: Gaynelle Arabian, MD;  Location: [facility];  Service: Orthopedics;  Laterality: Left;  . sigmoid colon resection  2010    Dr Arlyce Dice, [place] for fistula & diverticulitis  . TENDON REPAIR Left 2019   left shoulder tendon repair - surgical center   . TOTAL HIP ARTHROPLASTY Left 03/02/2019   Procedure: TOTAL HIP ARTHROPLASTY ANTERIOR APPROACH;  Surgeon: Gaynelle Arabian, MD;  Location: [facility];  Service: Orthopedics;  Laterality: Left;  122min  . TOTAL KNEE ARTHROPLASTY Right 06/11/2013   Procedure: RIGHT TOTAL KNEE ARTHROPLASTY;  Surgeon: Sydnee Cabal, MD;  Location: [facility];  Service: Orthopedics;  Laterality: Right;  . TRIGGER FINGER RELEASE Left 2013   thumb     Current Outpatient Medications  Medication Sig Dispense Refill  . cholecalciferol (VITAMIN D3) 25 MCG (1000 UT) tablet Take 1,000 Units by mouth daily.    . Cyanocobalamin (B-12) 5000 MCG CAPS Take 1 tablet by mouth daily.    Marland Kitchen estradiol (ESTRACE) 0.1 MG/GM vaginal cream INSERT 1 APPLICATORFUL VAGINALLY 2 TIMES WEEKLY 42.5 g 0  . FINACEA 15 % cream Apply 1  application topically daily.  2  . folic acid (FOLVITE) 1 MG tablet TAKE 1 TABLET(1 MG) BY MOUTH DAILY. 90 tablet 3  . Glucosamine-Chondroit-Vit C-Mn (GLUCOSAMINE CHONDR 1500 COMPLX) CAPS Take 1 capsule by mouth daily.    Marland Kitchen imatinib (GLEEVEC) 100 MG tablet TAKE 3 TABLETS BY MOUTH EVERY DAY. TAKE WITH MEALS AND LARGE GLASS OF WATER. CAUTION: CHEMOTHERAPY. 270 tablet 11  . Loperamide HCl (IMODIUM PO) Take 0.5-1 tablets by mouth 2 (two) times daily as needed (takes 0.5 tablet every morning and 2nd dose if needed for diarrhea).     . metoprolol tartrate (LOPRESSOR) 25 MG tablet Take 0.5 tablet every 8 hours as needed 90 tablet 1  . Multiple Vitamin (MULTIVITAMIN) tablet Take 1 tablet by mouth daily.    . Multiple Vitamins-Minerals  (PRESERVISION AREDS 2) CAPS Take 1 capsule by mouth 2 (two) times a day.    . Probiotic Product (ALIGN) 4 MG CAPS Take 1 capsule by mouth every other day.      No current facility-administered medications for this visit.    Allergies:   Penicillins    ROS:  Please see the history of present illness.   Otherwise, review of systems are positive for none.   All other systems are reviewed and negative.    PHYSICAL EXAM: VS:  BP 140/62 (BP Location: Left Arm, Patient Position: Sitting, Cuff Size: Normal)   Pulse 69   Ht 5\' 1"  (1.549 m)   Wt 118 lb 3.2 oz (53.6 kg)   BMI 22.33 kg/m  , BMI Body mass index is 22.33 kg/m. GENERAL:  Well appearing NECK:  No jugular venous distention, waveform within normal limits, carotid upstroke brisk and symmetric, no bruits, no thyromegaly LUNGS:  Clear to auscultation bilaterally CHEST:  Unremarkable HEART:  PMI not displaced or sustained,S1 and S2 within normal limits, no S3, no S4, no clicks, no rubs, no murmurs ABD:  Flat, positive bowel sounds normal in frequency in pitch, no bruits, no rebound, no guarding, no midline pulsatile mass, no hepatomegaly, no splenomegaly EXT:  2 plus pulses throughout, no edema, no cyanosis no clubbing   EKG:  EKG is  ordered today. The ekg ordered today demonstrates sinus rhythm, rate 69, axis within normal limits, intervals within normal limits, no acute ST-T wave changes.   Recent Labs: 08/01/2020: ALT 19; BUN 23; Creatinine, Ser 1.27; Hemoglobin 10.2; Platelets 131; Potassium 4.4; Sodium 139    Lipid Panel    Component Value Date/Time   CHOL 187 02/26/2017 1003   TRIG 59.0 02/26/2017 1003   HDL 83.00 02/26/2017 1003   CHOLHDL 2 02/26/2017 1003   VLDL 11.8 02/26/2017 1003   LDLCALC 92 02/26/2017 1003      Wt Readings from Last 3 Encounters:  08/31/20 118 lb 3.2 oz (53.6 kg)  08/11/20 118 lb 3.2 oz (53.6 kg)  04/27/20 119 lb 3.2 oz (54.1 kg)      Other studies Reviewed: Additional studies/ records  that were reviewed today include: labs . Review of the above records demonstrates:  Please see elsewhere in the note.     ASSESSMENT AND PLAN:  SVT:     Since I last saw her she has had no sustained tach arrhythmias and weaned her self off beta-blockers under my direction.   I am going to give her as needed beta-blocker to take since she has had some very minimal palpitations and documented mild SVT and NSVT before.  Otherwise no work-up is suggested.  Electrolytes were normal  last month.  She can return to see me as needed.    Current medicines are reviewed at length with the patient today.  The patient does not have concerns regarding medicines.  The following changes have been made:  None  Labs/ tests ordered today include:  None  Orders Placed This Encounter  Procedures  . EKG 12-Lead     Disposition:   FU with me in as needed   Signed, Minus Breeding, MD  08/31/2020 3:55 PM    [facility]

## 2020-08-31 ENCOUNTER — Encounter: Payer: Self-pay | Admitting: Cardiology

## 2020-08-31 ENCOUNTER — Other Ambulatory Visit: Payer: Self-pay

## 2020-08-31 ENCOUNTER — Ambulatory Visit (INDEPENDENT_AMBULATORY_CARE_PROVIDER_SITE_OTHER): Payer: Medicare PPO | Admitting: Cardiology

## 2020-08-31 VITALS — BP 140/62 | HR 69 | Ht 61.0 in | Wt 118.2 lb

## 2020-08-31 DIAGNOSIS — I471 Supraventricular tachycardia: Secondary | ICD-10-CM

## 2020-08-31 MED ORDER — METOPROLOL TARTRATE 25 MG PO TABS: ORAL_TABLET | ORAL | 1 refills | Status: DC

## 2020-08-31 NOTE — Patient Instructions (Addendum)
Medication Instructions:  Start Metoprolol 25 mg - take 0.5 tablet by mouth every 8 hours as needed.  *If you need a refill on your cardiac medications before your next appointment, please call your pharmacy*   Follow-Up: At [facility], you and your health needs are our priority.  As part of our continuing mission to provide you with exceptional heart care, we have created designated Provider Care Teams.  These Care Teams include your primary Cardiologist (physician) and Advanced Practice Providers (APPs -  Physician Assistants and Nurse Practitioners) who all work together to provide you with the care you need, when you need it.  We recommend signing up for the patient portal called "MyChart".  Sign up information is provided on this After Visit Summary.  MyChart is used to connect with patients for Virtual Visits (Telemedicine).  Patients are able to view lab/test results, encounter notes, upcoming appointments, etc.  Non-urgent messages can be sent to your provider as well.   To learn more about what you can do with MyChart, go to [email or web address].    Your next appointment:   12 month(s)  The format for your next appointment:   In Person  Provider:   Minus Breeding, MD

## 2020-09-01 ENCOUNTER — Other Ambulatory Visit: Payer: Self-pay | Admitting: Internal Medicine

## 2020-09-19 DIAGNOSIS — H35363 Drusen (degenerative) of macula, bilateral: Secondary | ICD-10-CM | POA: Diagnosis not present

## 2020-09-19 DIAGNOSIS — H26491 Other secondary cataract, right eye: Secondary | ICD-10-CM | POA: Diagnosis not present

## 2020-09-19 DIAGNOSIS — H43813 Vitreous degeneration, bilateral: Secondary | ICD-10-CM | POA: Diagnosis not present

## 2020-09-19 DIAGNOSIS — H353132 Nonexudative age-related macular degeneration, bilateral, intermediate dry stage: Secondary | ICD-10-CM | POA: Diagnosis not present

## 2021-02-06 ENCOUNTER — Inpatient Hospital Stay: Payer: Medicare PPO | Attending: Internal Medicine

## 2021-02-16 ENCOUNTER — Inpatient Hospital Stay: Payer: Medicare PPO | Admitting: Hematology and Oncology

## 2021-02-16 ENCOUNTER — Other Ambulatory Visit: Payer: Self-pay | Admitting: Internal Medicine

## 2021-02-16 NOTE — Telephone Encounter (Signed)
Patient is requesting refill rx folic acid (FOLVITE) 1 MG tablet  Patient's last appt 4-7-2

## 2021-02-28 ENCOUNTER — Other Ambulatory Visit: Payer: Self-pay

## 2021-02-28 ENCOUNTER — Inpatient Hospital Stay: Payer: Medicare PPO | Attending: Hematology and Oncology

## 2021-02-28 DIAGNOSIS — Z79899 Other long term (current) drug therapy: Secondary | ICD-10-CM | POA: Diagnosis not present

## 2021-02-28 DIAGNOSIS — T451X5A Adverse effect of antineoplastic and immunosuppressive drugs, initial encounter: Secondary | ICD-10-CM | POA: Diagnosis not present

## 2021-02-28 DIAGNOSIS — D61810 Antineoplastic chemotherapy induced pancytopenia: Secondary | ICD-10-CM | POA: Diagnosis not present

## 2021-02-28 DIAGNOSIS — R197 Diarrhea, unspecified: Secondary | ICD-10-CM | POA: Insufficient documentation

## 2021-02-28 DIAGNOSIS — Z880 Allergy status to penicillin: Secondary | ICD-10-CM | POA: Diagnosis not present

## 2021-02-28 DIAGNOSIS — C9210 Chronic myeloid leukemia, BCR/ABL-positive, not having achieved remission: Secondary | ICD-10-CM | POA: Diagnosis not present

## 2021-02-28 LAB — CBC WITH DIFFERENTIAL/PLATELET
Abs Immature Granulocytes: 0.01 10*3/uL (ref 0.00–0.07)
Basophils Absolute: 0 10*3/uL (ref 0.0–0.1)
Basophils Relative: 1 %
Eosinophils Absolute: 0.2 10*3/uL (ref 0.0–0.5)
Eosinophils Relative: 3 %
HCT: 26.9 % — ABNORMAL LOW (ref 36.0–46.0)
Hemoglobin: 9.2 g/dL — ABNORMAL LOW (ref 12.0–15.0)
Immature Granulocytes: 0 %
Lymphocytes Relative: 26 %
Lymphs Abs: 1.3 10*3/uL (ref 0.7–4.0)
MCH: 38.3 pg — ABNORMAL HIGH (ref 26.0–34.0)
MCHC: 34.2 g/dL (ref 30.0–36.0)
MCV: 112.1 fL — ABNORMAL HIGH (ref 80.0–100.0)
Monocytes Absolute: 0.7 10*3/uL (ref 0.1–1.0)
Monocytes Relative: 13 %
Neutro Abs: 2.9 10*3/uL (ref 1.7–7.7)
Neutrophils Relative %: 57 %
Platelets: 127 10*3/uL — ABNORMAL LOW (ref 150–400)
RBC: 2.4 MIL/uL — ABNORMAL LOW (ref 3.87–5.11)
RDW: 13.8 % (ref 11.5–15.5)
WBC: 5 10*3/uL (ref 4.0–10.5)
nRBC: 0 % (ref 0.0–0.2)

## 2021-02-28 LAB — COMPREHENSIVE METABOLIC PANEL
ALT: 21 U/L (ref 0–44)
AST: 37 U/L (ref 15–41)
Albumin: 4 g/dL (ref 3.5–5.0)
Alkaline Phosphatase: 60 U/L (ref 38–126)
Anion gap: 6 (ref 5–15)
BUN: 32 mg/dL — ABNORMAL HIGH (ref 8–23)
CO2: 32 mmol/L (ref 22–32)
Calcium: 9.4 mg/dL (ref 8.9–10.3)
Chloride: 102 mmol/L (ref 98–111)
Creatinine, Ser: 1.27 mg/dL — ABNORMAL HIGH (ref 0.44–1.00)
GFR, Estimated: 42 mL/min — ABNORMAL LOW (ref >60)
Glucose, Bld: 109 mg/dL — ABNORMAL HIGH (ref 70–99)
Potassium: 4.2 mmol/L (ref 3.5–5.1)
Sodium: 140 mmol/L (ref 135–145)
Total Bilirubin: 0.6 mg/dL (ref 0.3–1.2)
Total Protein: 6.7 g/dL (ref 6.5–8.1)

## 2021-03-02 NOTE — Progress Notes (Signed)
Subjective:    Patient ID: [name] [name], female    DOB: [birth year], 86 y.o.   MRN: [id number]  This visit occurred during the SARS-CoV-2 public health emergency.  Safety protocols were in place, including screening questions prior to the visit, additional usage of staff PPE, and extensive cleaning of exam room while observing appropriate contact time as indicated for disinfecting solutions.     HPI The patient is here for a physical and follow up of their chronic medical problems, including vaginal atrophy, CKD, CML, SVT, osteopenia  She has no concerns.  She feels well.    Medications and allergies reviewed with patient and updated if appropriate.  Patient Active Problem List   Diagnosis Date Noted  . SVT (supraventricular tachycardia) ([place]) 08/30/2020  . Palpitations 08/31/2019  . Anemia, chronic disease 08/31/2019  . Educated about COVID-19 virus infection 08/31/2019  . History of left hip replacement, 03/2019 08/24/2019  . CKD (chronic kidney disease), stage III ([place]) 08/13/2019  . Preventive measure 05/14/2019  . OA (osteoarthritis) of hip 03/02/2019  . Left hip pain 01/07/2019  . Iron deficiency anemia 11/21/2017  . Pain involving joint of finger of right hand 10/22/2017  . Vaginal atrophy 10/15/2017  . B12 deficiency 02/26/2017  . Bone lesion 09/18/2016  . Urinary incontinence 04/11/2016  . Vitamin D deficiency 10/02/2015  . Other fatigue 04/07/2015  . History of colonic polyps 08/06/2014  . Thrombocytopenia due to drugs 02/18/2014  . Leukopenia due to antineoplastic chemotherapy ([place]) 02/18/2014  . 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 History of breast cancer 07/18/2012  . Osteopenia 09/15/2011  . Diarrhea 08/29/2011  . Family history of malignant neoplasm of gastrointestinal tract 08/29/2011  . Pancytopenia due to antineoplastic chemotherapy ([place]) 04/14/2009  . DIVERTICULOSIS OF COLON 04/06/2008  . Chronic myeloid leukemia ([place]) 02/18/2007  . ROSACEA  02/18/2007    Current Outpatient Medications on File Prior to Visit  Medication Sig Dispense Refill  . cholecalciferol (VITAMIN D3) 25 MCG (1000 UT) tablet Take 1,000 Units by mouth daily.    . Cyanocobalamin (B-12) 5000 MCG CAPS Take 1 tablet by mouth daily.    Marland Kitchen estradiol (ESTRACE) 0.1 MG/GM vaginal cream INSERT 1 APPLICATORFUL VAGINALLY 2 TIMES WEEKLY 42.5 g 0  . FINACEA 15 % cream Apply 1 application topically daily.  2  . folic acid (FOLVITE) 1 MG tablet TAKE 1 TABLET(1 MG) BY MOUTH DAILY 90 tablet 1  . Glucosamine-Chondroit-Vit C-Mn (GLUCOSAMINE CHONDR 1500 COMPLX) CAPS Take 1 capsule by mouth daily.    Marland Kitchen imatinib (GLEEVEC) 100 MG tablet TAKE 3 TABLETS BY MOUTH EVERY DAY. TAKE WITH MEALS AND LARGE GLASS OF WATER. CAUTION: CHEMOTHERAPY. 270 tablet 11  . Loperamide HCl (IMODIUM PO) Take 0.5-1 tablets by mouth 2 (two) times daily as needed (takes 0.5 tablet every morning and 2nd dose if needed for diarrhea).     . metoprolol tartrate (LOPRESSOR) 25 MG tablet Take 0.5 tablet every 8 hours as needed 90 tablet 1  . Multiple Vitamin (MULTIVITAMIN) tablet Take 1 tablet by mouth daily.    . Multiple Vitamins-Minerals (PRESERVISION AREDS 2) CAPS Take 1 capsule by mouth 2 (two) times a day.    . Probiotic Product (ALIGN) 4 MG CAPS Take 1 capsule by mouth every other day.      No current facility-administered medications on file prior to visit.    Past Medical History:  Diagnosis Date  . Anemia requiring transfusions 2010 & 2015  . Arthritis  DJD  . Breast cancer ([place]) 1993   S/P lumpectomy , radiation, & Tamoxifen  . CML (chronic myeloid leukemia) ([facility])    Dr Alvy Bimler;  . SVT (supraventricular tachycardia) [facility])    dx june 2020 , magd by Dr Percival Spanish ; reports occasional heart flutter    Past Surgical History:  Procedure Laterality Date  . ABDOMINAL HYSTERECTOMY  ~1989   no BSO, for Dysplasia   . APPENDECTOMY  ~1989  . BILATERAL SALPINGOOPHORECTOMY  2010   with bowel resection  .  BREAST SURGERY     Right lumpectomy, s/p radiation 1993 and oral  Tamoxifen x 5 years ([id number])   . CATARACT EXTRACTION  06/08/2014   left eye;Dr Herbert Deaner  . CATARACT EXTRACTION, BILATERAL Bilateral 2015  . COLONOSCOPY  03/2003, last 2010   diverticulosis, Dr.Kaplan  . EXCISION MORTON'S NEUROMA Left mid 80's   foot  . gastrocslide     left leg; Dr Beola Cord  . ileostomy reversal  2011   3 mos after above  . KNEE SURGERY  mid 80's   Arthroscopy, right knee   . LEG TENDON SURGERY Left 2013   "cut on leg and fixed something"  . OPEN SURGICAL REPAIR OF GLUTEAL TENDON Left 10/24/2016   Procedure: Left hip bursectomy; gluteal tendon repair;  Surgeon: Gaynelle Arabian, MD;  Location: [facility];  Service: Orthopedics;  Laterality: Left;  . sigmoid colon resection  2010    Dr Arlyce Dice, [place] for fistula & diverticulitis  . TENDON REPAIR Left 2019   left shoulder tendon repair - surgical center   . TOTAL HIP ARTHROPLASTY Left 03/02/2019   Procedure: TOTAL HIP ARTHROPLASTY ANTERIOR APPROACH;  Surgeon: Gaynelle Arabian, MD;  Location: [facility];  Service: Orthopedics;  Laterality: Left;  143min  . TOTAL KNEE ARTHROPLASTY Right 06/11/2013   Procedure: RIGHT TOTAL KNEE ARTHROPLASTY;  Surgeon: Sydnee Cabal, MD;  Location: [facility];  Service: Orthopedics;  Laterality: Right;  . TRIGGER FINGER RELEASE Left 2013   thumb    Social History   Socioeconomic History  . Marital status: Married    Spouse name: Not on file  . Number of children: 2  . Years of education: Not on file  . Highest education level: Not on file  Occupational History  . Occupation: retired Camera operator  Tobacco Use  . Smoking status: Former    Years: 5.00    Types: Cigarettes    Quit date: 04/30/1962    Years since quitting: 58.8  . Smokeless tobacco: Never  . Tobacco comments:    smoked 1956-1964, up to 1/2 ppd  Vaping Use  . Vaping Use: Never used  Substance and Sexual Activity  . Alcohol use: Yes    Alcohol/week: 7.0 - 14.0  standard drinks    Types: 7 - 14 Glasses of wine per week    Comment: 10-29 about 10 glasses of wine weekly   . Drug use: No  . Sexual activity: Yes  Other Topics Concern  . Not on file  Social History Narrative   Married,    Social Determinants of Health   Financial Resource Strain: Low Risk   . Difficulty of Paying Living Expenses: Not hard at all  Food Insecurity: No Food Insecurity  . Worried About Charity fundraiser in the Last Year: Never true  . Ran Out of Food in the Last Year: Never true  Transportation Needs: No Transportation Needs  . Lack of Transportation (Medical): No  . Lack of Transportation (Non-Medical): No  Physical Activity: Sufficiently Active  . Days of Exercise per Week: 5 days  . Minutes of Exercise per Session: 30 min  Stress: No Stress Concern Present  . Feeling of Stress : Not at all  Social Connections: Socially Integrated  . Frequency of Communication with Friends and Family: More than three times a week  . Frequency of Social Gatherings with Friends and Family: Never  . Attends Religious Services: More than 4 times per year  . Active Member of Clubs or Organizations: No  . Attends Archivist Meetings: More than 4 times per year  . Marital Status: Married    Family History  Problem Relation Age of Onset  . Colon cancer Mother 49  . Uterine cancer Sister   . Diabetes Cousin   . Cancer Father        Lip cancer  . Aneurysm Father        AAA;S/P surgery  . Breast cancer Paternal Aunt   . Heart attack Paternal Uncle 45  . Stroke Neg Hx   . Esophageal cancer Neg Hx   . Rectal cancer Neg Hx   . Stomach cancer Neg Hx     Review of Systems  Constitutional:  Negative for chills and fever.  HENT:  Positive for postnasal drip. Negative for congestion, sinus pressure and sore throat.   Eyes:  Negative for visual disturbance.  Respiratory:  Negative for cough, shortness of breath and wheezing.   Cardiovascular:  Positive for  palpitations (occ, transient). Negative for chest pain and leg swelling.  Gastrointestinal:  Positive for diarrhea (medication related). Negative for abdominal pain, blood in stool, constipation and nausea.       No gerd  Genitourinary:  Negative for dysuria and hematuria.  Musculoskeletal:  Negative for arthralgias and back pain.  Skin:  Negative for color change and rash.  Neurological:  Negative for dizziness, light-headedness and headaches.  Psychiatric/Behavioral:  Negative for dysphoric mood. The patient is not nervous/anxious.       Objective:   Vitals:   03/03/21 1330  BP: 118/74  Pulse: 70  Temp: 98 F (36.7 C)  SpO2: 97%   BP Readings from Last 3 Encounters:  03/03/21 118/74  08/31/20 140/62  08/11/20 122/70   Wt Readings from Last 3 Encounters:  03/03/21 117 lb (53.1 kg)  08/31/20 118 lb 3.2 oz (53.6 kg)  08/11/20 118 lb 3.2 oz (53.6 kg)   Body mass index is 22.11 kg/m.   Physical Exam    Constitutional: She appears well-developed and well-nourished. No distress.  HENT:  Head: Normocephalic and atraumatic.  Right Ear: External ear normal. Normal ear canal and TM Left Ear: External ear normal.  Normal ear canal and TM Mouth/Throat: Oropharynx is clear and moist.  Eyes: Conjunctivae and EOM are normal.  Neck: Neck supple. No tracheal deviation present. No thyromegaly present.  No carotid bruit  Cardiovascular: Normal rate, regular rhythm and normal heart sounds.   No murmur heard.  No edema. Pulmonary/Chest: Effort normal and breath sounds normal. No respiratory distress. She has no wheezes. She has no rales.  Breast: deferred  Abdominal: Soft. She exhibits no distension. There is no tenderness.  Lymphadenopathy: She has no cervical adenopathy.  Skin: Skin is warm and dry. She is not diaphoretic.  Psychiatric: She has a normal mood and affect. Her behavior is normal.       Assessment & Plan:    Physical exam: Screening blood work  deferred Exercise not regular Weight  normal  Flu immunization administered today.    Health Maintenance  Topic Date Due  . Zoster Vaccines- Shingrix (1 of 2) Never done  . TETANUS/TDAP  04/30/2016  . COVID-19 Vaccine (3 - Pfizer risk series) 07/30/2019  . DEXA SCAN  02/19/2020  . Pneumonia Vaccine 50+ Years old  Completed  . HPV VACCINES  Aged Out    See Problem List for Assessment and Plan of chronic medical problems.   See Problem List for Assessment and Plan of chronic medical problems.

## 2021-03-02 NOTE — Patient Instructions (Addendum)
Flu immunization administered today.     Medications changes include :   none    Please followup in 1 year    Health Maintenance, Female Adopting a healthy lifestyle and getting preventive care are important in promoting health and wellness. Ask your health care provider about: The right schedule for you to have regular tests and exams. Things you can do on your own to prevent diseases and keep yourself healthy. What should I know about diet, weight, and exercise? Eat a healthy diet  Eat a diet that includes plenty of vegetables, fruits, low-fat dairy products, and lean protein. Do not eat a lot of foods that are high in solid fats, added sugars, or sodium. Maintain a healthy weight Body mass index (BMI) is used to identify weight problems. It estimates body fat based on height and weight. Your health care provider can help determine your BMI and help you achieve or maintain a healthy weight. Get regular exercise Get regular exercise. This is one of the most important things you can do for your health. Most adults should: Exercise for at least 150 minutes each week. The exercise should increase your heart rate and make you sweat (moderate-intensity exercise). Do strengthening exercises at least twice a week. This is in addition to the moderate-intensity exercise. Spend less time sitting. Even light physical activity can be beneficial. Watch cholesterol and blood lipids Have your blood tested for lipids and cholesterol at 84 years of age, then have this test every 5 years. Have your cholesterol levels checked more often if: Your lipid or cholesterol levels are high. You are older than 84 years of age. You are at high risk for heart disease. What should I know about cancer screening? Depending on your health history and family history, you may need to have cancer screening at various ages. This may include screening for: Breast cancer. Cervical cancer. Colorectal cancer. Skin  cancer. Lung cancer. What should I know about heart disease, diabetes, and high blood pressure? Blood pressure and heart disease High blood pressure causes heart disease and increases the risk of stroke. This is more likely to develop in people who have high blood pressure readings or are overweight. Have your blood pressure checked: Every 3-5 years if you are 2-47 years of age. Every year if you are 75 years old or older. Diabetes Have regular diabetes screenings. This checks your fasting blood sugar level. Have the screening done: Once every three years after age 78 if you are at a normal weight and have a low risk for diabetes. More often and at a younger age if you are overweight or have a high risk for diabetes. What should I know about preventing infection? Hepatitis B If you have a higher risk for hepatitis B, you should be screened for this virus. Talk with your health care provider to find out if you are at risk for hepatitis B infection. Hepatitis C Testing is recommended for: Everyone born from [birth year] through 1963. Anyone with known risk factors for hepatitis C. Sexually transmitted infections (STIs) Get screened for STIs, including gonorrhea and chlamydia, if: You are sexually active and are younger than 84 years of age. You are older than 84 years of age and your health care provider tells you that you are at risk for this type of infection. Your sexual activity has changed since you were last screened, and you are at increased risk for chlamydia or gonorrhea. Ask your health care provider if you are at risk.  Ask your health care provider about whether you are at high risk for HIV. Your health care provider may recommend a prescription medicine to help prevent HIV infection. If you choose to take medicine to prevent HIV, you should first get tested for HIV. You should then be tested every 3 months for as long as you are taking the medicine. Pregnancy If you are about to stop  having your period (premenopausal) and you may become pregnant, seek counseling before you get pregnant. Take 400 to 800 micrograms (mcg) of folic acid every day if you become pregnant. Ask for birth control (contraception) if you want to prevent pregnancy. Osteoporosis and menopause Osteoporosis is a disease in which the bones lose minerals and strength with aging. This can result in bone fractures. If you are 70 years old or older, or if you are at risk for osteoporosis and fractures, ask your health care provider if you should: Be screened for bone loss. Take a calcium or vitamin D supplement to lower your risk of fractures. Be given hormone replacement therapy (HRT) to treat symptoms of menopause. Follow these instructions at home: Alcohol use Do not drink alcohol if: Your health care provider tells you not to drink. You are pregnant, may be pregnant, or are planning to become pregnant. If you drink alcohol: Limit how much you have to: 0-1 drink a day. Know how much alcohol is in your drink. In the [place] one drink equals one 12 oz bottle of beer (355 mL), one 5 oz glass of wine (148 mL), or one 1 oz glass of hard liquor (44 mL). Lifestyle Do not use any products that contain nicotine or tobacco. These products include cigarettes, chewing tobacco, and vaping devices, such as e-cigarettes. If you need help quitting, ask your health care provider. Do not use street drugs. Do not share needles. Ask your health care provider for help if you need support or information about quitting drugs. General instructions Schedule regular health, dental, and eye exams. Stay current with your vaccines. Tell your health care provider if: You often feel depressed. You have ever been abused or do not feel safe at home. Summary Adopting a healthy lifestyle and getting preventive care are important in promoting health and wellness. Follow your health care provider's instructions about healthy diet,  exercising, and getting tested or screened for diseases. Follow your health care provider's instructions on monitoring your cholesterol and blood pressure. This information is not intended to replace advice given to you by your health care provider. Make sure you discuss any questions you have with your health care provider. Document Revised: 09/05/2020 Document Reviewed: 09/05/2020 Elsevier Patient Education  [place].

## 2021-03-03 ENCOUNTER — Encounter: Payer: Self-pay | Admitting: Internal Medicine

## 2021-03-03 ENCOUNTER — Ambulatory Visit: Payer: Medicare PPO | Admitting: Internal Medicine

## 2021-03-03 ENCOUNTER — Other Ambulatory Visit: Payer: Self-pay

## 2021-03-03 VITALS — BP 118/74 | HR 70 | Temp 98.0°F | Ht 61.0 in | Wt 117.0 lb

## 2021-03-03 DIAGNOSIS — N952 Postmenopausal atrophic vaginitis: Secondary | ICD-10-CM

## 2021-03-03 DIAGNOSIS — N1832 Chronic kidney disease, stage 3b: Secondary | ICD-10-CM | POA: Diagnosis not present

## 2021-03-03 DIAGNOSIS — E559 Vitamin D deficiency, unspecified: Secondary | ICD-10-CM | POA: Diagnosis not present

## 2021-03-03 DIAGNOSIS — M85859 Other specified disorders of bone density and structure, unspecified thigh: Secondary | ICD-10-CM

## 2021-03-04 NOTE — Assessment & Plan Note (Signed)
Chronic-stable.

## 2021-03-04 NOTE — Assessment & Plan Note (Signed)
Chronic dexa due - ordered Encouraged regular exercise Continue vitamin d daily

## 2021-03-04 NOTE — Assessment & Plan Note (Signed)
Chronic Continue estrace vaginal cream BIW

## 2021-03-04 NOTE — Assessment & Plan Note (Signed)
Chronic Continue vitamin d daily

## 2021-03-06 ENCOUNTER — Ambulatory Visit: Payer: Medicare PPO | Admitting: Hematology and Oncology

## 2021-03-06 ENCOUNTER — Telehealth: Payer: Self-pay | Admitting: Hematology and Oncology

## 2021-03-06 LAB — BCR/ABL

## 2021-03-06 NOTE — Telephone Encounter (Signed)
Scheduled appointment per provider. Patient aware, request morning appointment.

## 2021-03-07 ENCOUNTER — Inpatient Hospital Stay: Payer: Medicare PPO | Admitting: Hematology and Oncology

## 2021-03-09 ENCOUNTER — Ambulatory Visit: Payer: Medicare PPO | Admitting: Hematology and Oncology

## 2021-03-10 ENCOUNTER — Encounter: Payer: Self-pay | Admitting: Hematology and Oncology

## 2021-03-10 ENCOUNTER — Inpatient Hospital Stay: Payer: Medicare PPO | Admitting: Hematology and Oncology

## 2021-03-10 ENCOUNTER — Other Ambulatory Visit: Payer: Self-pay

## 2021-03-10 DIAGNOSIS — T451X5A Adverse effect of antineoplastic and immunosuppressive drugs, initial encounter: Secondary | ICD-10-CM | POA: Diagnosis not present

## 2021-03-10 DIAGNOSIS — C9210 Chronic myeloid leukemia, BCR/ABL-positive, not having achieved remission: Secondary | ICD-10-CM

## 2021-03-10 DIAGNOSIS — R197 Diarrhea, unspecified: Secondary | ICD-10-CM | POA: Diagnosis not present

## 2021-03-10 DIAGNOSIS — D61810 Antineoplastic chemotherapy induced pancytopenia: Secondary | ICD-10-CM | POA: Diagnosis not present

## 2021-03-10 DIAGNOSIS — Z79899 Other long term (current) drug therapy: Secondary | ICD-10-CM | POA: Diagnosis not present

## 2021-03-10 DIAGNOSIS — Z880 Allergy status to penicillin: Secondary | ICD-10-CM | POA: Diagnosis not present

## 2021-03-10 NOTE — Assessment & Plan Note (Signed)
Her blood count is stable So far, she tolerated treatment well without recent diarrhea, fluid retention or shortness of breath She has mild pancytopenia with treatment but she is not symptomatic BCR/ABL is not detected and she remained in major molecular response We will continue treatment indefinitely I will schedule return appointment in 6 months The patient desired to change prescription insurance due to increased cost of coverage of [place] and I will try to help her with this

## 2021-03-10 NOTE — Assessment & Plan Note (Signed)
She has stable pancytopenia Serum vitamin B12 and thyroid function were within normal limits She will continue Gleevec as prescribed 

## 2021-03-10 NOTE — Progress Notes (Signed)
[place] OFFICE PROGRESS NOTE  Patient Care Team: Binnie Rail, MD as PCP - General (Internal Medicine) Minus Breeding, MD as PCP - Cardiology (Cardiology) Gaynelle Arabian, MD as Consulting Physician (Orthopedic Surgery) Heath Lark, MD as Consulting Physician (Hematology and Oncology) Monna Fam, MD as Consulting Physician (Ophthalmology)  ASSESSMENT & PLAN:  Chronic myeloid leukemia ([place]) Her blood count is stable So far, she tolerated treatment well without recent diarrhea, fluid retention or shortness of breath She has mild pancytopenia with treatment but she is not symptomatic BCR/ABL is not detected and she remained in major molecular response We will continue treatment indefinitely I will schedule return appointment in 6 months The patient desired to change prescription insurance due to increased cost of coverage of [place] and I will try to help her with this  Pancytopenia due to antineoplastic chemotherapy [facility]) She has stable pancytopenia Serum vitamin B12 and thyroid function were within normal limits She will continue Gleevec as prescribed  No orders of the defined types were placed in this encounter.   All questions were answered. The patient knows to call the clinic with any problems, questions or concerns. The total time spent in the appointment was 20 minutes encounter with patients including review of chart and various tests results, discussions about plan of care and coordination of care plan   Heath Lark, MD 03/10/2021 4:49 PM  INTERVAL HISTORY: Please see below for problem oriented charting. she returns for treatment follow-up on [place] for CML Her energy level is fair She has occasional diarrhea No recent fluid retention or shortness of breath  REVIEW OF SYSTEMS:   Constitutional: Denies fevers, chills or abnormal weight loss Eyes: Denies blurriness of vision Ears, nose, mouth, throat, and face: Denies mucositis or sore  throat Respiratory: Denies cough, dyspnea or wheezes Cardiovascular: Denies palpitation, chest discomfort or lower extremity swelling Gastrointestinal:  Denies nausea, heartburn or change in bowel habits Skin: Denies abnormal skin rashes Lymphatics: Denies new lymphadenopathy or easy bruising Neurological:Denies numbness, tingling or new weaknesses Behavioral/Psych: Mood is stable, no new changes  All other systems were reviewed with the patient and are negative.  I have reviewed the past medical history, past surgical history, social history and family history with the patient and they are unchanged from previous note.  ALLERGIES:  is allergic to penicillins.  MEDICATIONS:  Current Outpatient Medications  Medication Sig Dispense Refill   cholecalciferol (VITAMIN D3) 25 MCG (1000 UT) tablet Take 1,000 Units by mouth daily.     Cyanocobalamin (B-12) 5000 MCG CAPS Take 1 tablet by mouth daily.     estradiol (ESTRACE) 0.1 MG/GM vaginal cream INSERT 1 APPLICATORFUL VAGINALLY 2 TIMES WEEKLY 42.5 g 0   FINACEA 15 % cream Apply 1 application topically daily.  2   folic acid (FOLVITE) 1 MG tablet TAKE 1 TABLET(1 MG) BY MOUTH DAILY 90 tablet 1   Glucosamine-Chondroit-Vit C-Mn (GLUCOSAMINE CHONDR 1500 COMPLX) CAPS Take 1 capsule by mouth daily.     imatinib (GLEEVEC) 100 MG tablet TAKE 3 TABLETS BY MOUTH EVERY DAY. TAKE WITH MEALS AND LARGE GLASS OF WATER. CAUTION: CHEMOTHERAPY. 270 tablet 11   Loperamide HCl (IMODIUM PO) Take 0.5-1 tablets by mouth 2 (two) times daily as needed (takes 0.5 tablet every morning and 2nd dose if needed for diarrhea).      metoprolol tartrate (LOPRESSOR) 25 MG tablet Take 0.5 tablet every 8 hours as needed 90 tablet 1   Multiple Vitamin (MULTIVITAMIN) tablet Take 1 tablet by mouth daily.  Multiple Vitamins-Minerals (PRESERVISION AREDS 2) CAPS Take 1 capsule by mouth 2 (two) times a day.     Probiotic Product (ALIGN) 4 MG CAPS Take 1 capsule by mouth every other day.       No current facility-administered medications for this visit.    SUMMARY OF ONCOLOGIC HISTORY: Oncology History  Chronic myeloid leukemia ([place])  02/18/2007 Initial Diagnosis   Chronic myeloid leukemia   08/20/2014 Tumor Marker   BCR/ABL is undetectable   03/04/2015 Pathology Results   BCR/ABL is undetectable   08/29/2015 Tumor Marker   BCR/ABL is undetectable   02/29/2016 Pathology Results   BCR/ABL is undetectable   05/01/2016 Pathology Results   BCR/ABL b2a2 is detected at 0.03% and IS ratio 0.069%   05/31/2016 Pathology Results   BCR/ABL b2a2 is detected at 0.12% and IS ratio 0.276%   06/19/2016 Pathology Results   BCR/ABL b2a2 is detected at 0.24% and IS ratio 0.552%   06/19/2016 Miscellaneous   ABL mutation kinase testing is negative   06/27/2016 Miscellaneous   Dose of Gleevec is increased to 600 mg daily   07/25/2016 Pathology Results   BCR/ABL b2a2 is detected at 0.03% and IS ratio 0.069%. She has achieved MMR   08/30/2016 Miscellaneous   Dose of Gleevec is reduced to 300 mg daily and subsequently down to 200 mg daily   10/22/2016 Pathology Results   BCR/ABL b2a2 is not detected. She has achieved MMR   01/03/2017 Pathology Results   BCR/ABL b2a2 is detected IS ratio 0.041%   02/18/2017 Pathology Results   BCR/ABL b2a2 is detected IS ratio 0.0126   05/21/2017 Pathology Results   BCR/ABL b2a2 is not detected. She has achieved MMR   08/20/2017 Pathology Results   BCR/ABL b2a2 is not detected. She has achieved MMR   11/12/2017 Pathology Results   BCR/ABL b2a2 is not detected   04/07/2018 Pathology Results   BCR/ABL b2a2 is detected IS ratio 0.0062. She is in MMR   06/27/2018 Pathology Results   BCR/ABL b2a2 is detected IS ratio 0.0097%. She is in MMR   08/29/2018 Pathology Results   BCR/ABL is undetectable. She is in MMR   12/26/2018 Pathology Results   BCR/ABL b2a2 is detected IS ratio 0.0042%. She is in MMR   05/04/2019 Pathology Results   BCR/ABL b2a2 is  detected IS ratio 0.0575%. She is in MMR   08/03/2019 Pathology Results   BCR/ABL b2a2 is detected IS ratio 0.0167%. She is in MMR   02/01/2020 Pathology Results   BCR/ABL is undetectable   08/01/2020 Pathology Results   BCR/ABL is undetectable   02/28/2021 Pathology Results   BCR/ABL is undetectable     PHYSICAL EXAMINATION: ECOG PERFORMANCE STATUS: 1 - Symptomatic but completely ambulatory  Vitals:   03/10/21 0836  BP: 134/64  Pulse: 73  Resp: 18  Temp: (!) 97.5 F (36.4 C)  SpO2: 100%   Filed Weights   03/10/21 0836  Weight: 119 lb 12.8 oz (54.3 kg)    GENERAL:alert, no distress and comfortable SKIN: skin color, texture, turgor are normal, no rashes or significant lesions EYES: normal, Conjunctiva are pink and non-injected, sclera clear OROPHARYNX:no exudate, no erythema and lips, buccal mucosa, and tongue normal  NECK: supple, thyroid normal size, non-tender, without nodularity LYMPH:  no palpable lymphadenopathy in the cervical, axillary or inguinal LUNGS: clear to auscultation and percussion with normal breathing effort HEART: regular rate & rhythm and no murmurs and no lower extremity edema ABDOMEN:abdomen soft, non-tender and normal bowel sounds  Musculoskeletal:no cyanosis of digits and no clubbing  NEURO: alert & oriented x 3 with fluent speech, no focal motor/sensory deficits  LABORATORY DATA:  I have reviewed the data as listed    Component Value Date/Time   NA 140 02/28/2021 1349   NA 140 02/18/2017 0808   K 4.2 02/28/2021 1349   K 4.4 02/18/2017 0808   CL 102 02/28/2021 1349   CL 105 09/04/2012 1006   CO2 32 02/28/2021 1349   CO2 29 02/18/2017 0808   GLUCOSE 109 (H) 02/28/2021 1349   GLUCOSE 86 02/18/2017 0808   GLUCOSE 96 09/04/2012 1006   BUN 32 (H) 02/28/2021 1349   BUN 20.3 02/18/2017 0808   CREATININE 1.27 (H) 02/28/2021 1349   CREATININE 0.93 05/04/2019 1108   CREATININE 1.1 02/18/2017 0808   CALCIUM 9.4 02/28/2021 1349   CALCIUM 9.4  02/18/2017 0808   PROT 6.7 02/28/2021 1349   PROT 6.6 02/18/2017 0808   ALBUMIN 4.0 02/28/2021 1349   ALBUMIN 4.2 02/18/2017 0808   AST 37 02/28/2021 1349   AST 34 05/04/2019 1108   AST 34 02/18/2017 0808   ALT 21 02/28/2021 1349   ALT 19 05/04/2019 1108   ALT 18 02/18/2017 0808   ALKPHOS 60 02/28/2021 1349   ALKPHOS 53 02/18/2017 0808   BILITOT 0.6 02/28/2021 1349   BILITOT 0.9 05/04/2019 1108   BILITOT 0.76 02/18/2017 0808   GFRNONAA 42 (L) 02/28/2021 1349   GFRNONAA 57 (L) 05/04/2019 1108   GFRAA 45 (L) 02/01/2020 1023   GFRAA >60 05/04/2019 1108    No results found for: SPEP, UPEP  Lab Results  Component Value Date   WBC 5.0 02/28/2021   NEUTROABS 2.9 02/28/2021   HGB 9.2 (L) 02/28/2021   HCT 26.9 (L) 02/28/2021   MCV 112.1 (H) 02/28/2021   PLT 127 (L) 02/28/2021      Chemistry      Component Value Date/Time   NA 140 02/28/2021 1349   NA 140 02/18/2017 0808   K 4.2 02/28/2021 1349   K 4.4 02/18/2017 0808   CL 102 02/28/2021 1349   CL 105 09/04/2012 1006   CO2 32 02/28/2021 1349   CO2 29 02/18/2017 0808   BUN 32 (H) 02/28/2021 1349   BUN 20.3 02/18/2017 0808   CREATININE 1.27 (H) 02/28/2021 1349   CREATININE 0.93 05/04/2019 1108   CREATININE 1.1 02/18/2017 0808      Component Value Date/Time   CALCIUM 9.4 02/28/2021 1349   CALCIUM 9.4 02/18/2017 0808   ALKPHOS 60 02/28/2021 1349   ALKPHOS 53 02/18/2017 0808   AST 37 02/28/2021 1349   AST 34 05/04/2019 1108   AST 34 02/18/2017 0808   ALT 21 02/28/2021 1349   ALT 19 05/04/2019 1108   ALT 18 02/18/2017 0808   BILITOT 0.6 02/28/2021 1349   BILITOT 0.9 05/04/2019 1108   BILITOT 0.76 02/18/2017 3220

## 2021-03-28 DIAGNOSIS — L900 Lichen sclerosus et atrophicus: Secondary | ICD-10-CM | POA: Diagnosis not present

## 2021-03-28 DIAGNOSIS — R102 Pelvic and perineal pain: Secondary | ICD-10-CM | POA: Diagnosis not present

## 2021-03-28 DIAGNOSIS — N952 Postmenopausal atrophic vaginitis: Secondary | ICD-10-CM | POA: Diagnosis not present

## 2021-03-31 ENCOUNTER — Encounter: Payer: Self-pay | Admitting: Internal Medicine

## 2021-03-31 DIAGNOSIS — H524 Presbyopia: Secondary | ICD-10-CM | POA: Diagnosis not present

## 2021-03-31 DIAGNOSIS — H43393 Other vitreous opacities, bilateral: Secondary | ICD-10-CM | POA: Diagnosis not present

## 2021-03-31 DIAGNOSIS — H35033 Hypertensive retinopathy, bilateral: Secondary | ICD-10-CM | POA: Diagnosis not present

## 2021-03-31 DIAGNOSIS — H35363 Drusen (degenerative) of macula, bilateral: Secondary | ICD-10-CM | POA: Diagnosis not present

## 2021-03-31 DIAGNOSIS — H353132 Nonexudative age-related macular degeneration, bilateral, intermediate dry stage: Secondary | ICD-10-CM | POA: Diagnosis not present

## 2021-03-31 LAB — HM DIABETES EYE EXAM

## 2021-04-18 ENCOUNTER — Encounter: Payer: Self-pay | Admitting: Family Medicine

## 2021-04-18 ENCOUNTER — Other Ambulatory Visit: Payer: Self-pay

## 2021-04-18 ENCOUNTER — Telehealth (INDEPENDENT_AMBULATORY_CARE_PROVIDER_SITE_OTHER): Payer: Medicare PPO | Admitting: Family Medicine

## 2021-04-18 ENCOUNTER — Telehealth: Payer: Self-pay | Admitting: Internal Medicine

## 2021-04-18 VITALS — Temp 96.4°F | Wt 116.0 lb

## 2021-04-18 DIAGNOSIS — R0981 Nasal congestion: Secondary | ICD-10-CM | POA: Diagnosis not present

## 2021-04-18 DIAGNOSIS — R519 Headache, unspecified: Secondary | ICD-10-CM | POA: Diagnosis not present

## 2021-04-18 DIAGNOSIS — R059 Cough, unspecified: Secondary | ICD-10-CM

## 2021-04-18 MED ORDER — DOXYCYCLINE HYCLATE 100 MG PO TABS: 100.0000 mg | ORAL_TABLET | Freq: Two times a day (BID) | ORAL | 0 refills | Status: DC

## 2021-04-18 MED ORDER — BENZONATATE 100 MG PO CAPS: ORAL_CAPSULE | ORAL | 0 refills | Status: DC

## 2021-04-18 NOTE — Patient Instructions (Addendum)
-  I sent the medication(s) we discussed to your pharmacy: Meds ordered this encounter  Medications   doxycycline (VIBRA-TABS) 100 MG tablet    Sig: Take 1 tablet (100 mg total) by mouth 2 (two) times daily.    Dispense:  20 tablet    Refill:  0   benzonatate (TESSALON PERLES) 100 MG capsule    Sig: 1-2 capsules up to twice daily as needed for cough.    Dispense:  30 capsule    Refill:  0   Do a covid test. If positive, within the first 5 days of symptoms and you wish to try and antiviral, you can contact a [place] pharmacy or you can do a follow up virtual visit.   Nasal saline twice daily.   I hope you are feeling better soon!  Seek in person care promptly if your symptoms worsen, new concerns arise or you are not improving with treatment.  It was nice to meet you today. I help [facility] out with telemedicine visits on Tuesdays and Thursdays and am happy to help if you need a virtual follow up visit on those days. Otherwise, if you have any concerns or questions following this visit please schedule a follow up visit with your Primary Care office or seek care at a local urgent care clinic to avoid delays in care

## 2021-04-18 NOTE — Progress Notes (Signed)
Virtual Visit via Telephone Note  I connected with [name] [name] on 04/18/21 at 10:40 AM EST by telephone and verified that I am speaking with the correct person using two identifiers.   I discussed the limitations, risks, security and privacy concerns of performing an evaluation and management service by telephone and the availability of in person appointments. I also discussed with the patient that there may be a patient responsible charge related to this service. The patient expressed understanding and agreed to proceed.  Location patient: home, [place] Location provider: work or home office Participants present for the call: patient, provider Patient did not have a visit with me in the prior 7 days to address this/these issue(s).   History of Present Illness:  Acute telemedicine visit for sinus issues: -Onset: has chronic sinus drainage but worse the last few days -Symptoms include: thick nasal discharge, PND, cough, sinus discomfort R cheekbone -Denies:fevers, CP, SOB, NVD, inability to eat/drink/get out of bed, sick contacts -Has tried:nettie pot, but that has not helped -Pertinent past medical history: see below -Pertinent medication allergies:  Allergies  Allergen Reactions   Penicillins Itching and Rash    Hives Because of a history of documented adverse serious drug reaction;Medi Alert bracelet  is recommended Has patient had a PCN reaction causing immediate rash, facial/tongue/throat swelling, SOB or lightheadedness with hypotension: No Has patient had a PCN reaction causing severe rash involving mucus membranes or skin necrosis: Yes Has patient had a PCN reaction that required hospitalization: No Has patient had a PCN reaction occurring within the last 10 years: No If all of the above answers are  -COVID-19 vaccine status: has had 2 boosters (4 doses total) Immunization History  Administered Date(s) Administered   Fluad Quad(high Dose 65+) 02/06/2019, 02/01/2020    Influenza Split 02/17/2015   Influenza Whole 02/29/2012   Influenza, High Dose Seasonal PF 02/17/2016   Influenza,inj,Quad PF,6+ Mos 04/15/2018   Influenza,inj,quad, With Preservative 01/28/2017   Influenza-Unspecified 03/14/2013, 01/19/2014, 01/17/2017, 02/16/2021   PFIZER(Purple Top)SARS-COV-2 Vaccination 06/07/2019, 07/02/2019   Pneumococcal Conjugate-13 03/01/2015   Pneumococcal Polysaccharide-23 03/30/2002, 09/29/2002, 03/11/2012   Td 04/30/2006     Observations/Objective: Patient sounds cheerful and well on the phone. I do not appreciate any SOB. Speech and thought processing are grossly intact. Patient reported vitals:  Assessment and Plan:  Nasal sinus congestion  Facial discomfort  Cough, unspecified type  -we discussed possible serious and likely etiologies, options for evaluation and workup, limitations of telemedicine visit vs in person visit, treatment, treatment risks and precautions. Pt prefers to treat via telemedicine empirically rather than in person at this moment. Query developing sinusitis, new acute viral illness, covid19 vs other. She feels is a bacterial sinusitis and wants to try and abx. Sent doxy and tessalon for cough. Did advise covid testing and advised of options and window for antiviral if positive. Continue nasal saline.  Advised to seek prompt virtual visit or in person care if worsening, new symptoms arise, or if is not improving with treatment. Advised of options for inperson care in case PCP office not available. Did let the patient know that I only do telemedicine shifts for [facility] on Tuesdays and Thursdays and advised a follow up visit with PCP or at an [facility] if has further questions or concerns.   Follow Up Instructions:  I did not refer this patient for an OV with me in the next 24 hours for this/these issue(s).  I discussed the assessment and treatment plan with the patient. The  patient was provided an opportunity to ask questions and all were  answered. The patient agreed with the plan and demonstrated an understanding of the instructions.   I spent 12 minutes on the date of this visit in the care of this patient. See summary of tasks completed to properly care for this patient in the detailed notes above which also included counseling of above, review of PMH, medications, allergies, evaluation of the patient and ordering and/or  instructing patient on testing and care options.     [name], DO

## 2021-04-18 NOTE — Telephone Encounter (Signed)
Patient calling in  Patient had VV w/ Maudie Mercury today 12/20  After VV patient took at home covid test & it was positive  Meds were called in for sinus infection but patient wants to know does she need anything additional for the covid  Please call [phone]

## 2021-04-19 NOTE — Telephone Encounter (Signed)
Spoke with patient today.  Protocol given regarding COVID guidelines and masking.  All questions answered.  Patient informed to seek in treatment if symptoms became worse.

## 2021-04-19 NOTE — Telephone Encounter (Signed)
Patient calling back in following up call from yesterday.. please call patient back as soon as possible

## 2021-05-02 ENCOUNTER — Encounter: Payer: Self-pay | Admitting: Internal Medicine

## 2021-05-05 ENCOUNTER — Telehealth: Payer: Self-pay

## 2021-05-05 NOTE — Telephone Encounter (Signed)
Pt calling in for advise. Pt had VV with Colin Benton 04/18/21 pt complete a 10 day antibiotic regimen. Pt states it was getting better but then at night now the headache, congestion, coughing are returning. Pt is requesting some more antibiotic be called in or something else to help with this sinus infection.  Please contact pt with recommendation [phone]

## 2021-05-05 NOTE — Telephone Encounter (Signed)
Patient to be seen next week-advised to urgent care sooner if symptoms worsen over the weekend

## 2021-05-08 NOTE — Progress Notes (Signed)
Subjective:    Patient ID: [name] [name], female    DOB: 1934-11-16, 87 y.o.   MRN: [id number]  This visit occurred during the SARS-CoV-2 public health emergency.  Safety protocols were in place, including screening questions prior to the visit, additional usage of staff PPE, and extensive cleaning of exam room while observing appropriate contact time as indicated for disinfecting solutions.    HPI She is here for an acute visit for cold symptoms.   Her symptoms started mid December.  She did an e-visit with Dr Maudie Mercury 12/20 and was prescribed tessalon perles and doxycyline.   She was experiencing sinus pain, cough, congestion.  She did test positive for covid two days prior to xmas.  She tested positive for a while after and then stopped testing herself.    She took the doxycycline and it helped.    She still has symptoms at night.  She feels great during the day.  She is still coughing something up at times.  She has sinus pressure and drainage at night only when she lays down.       Medications and allergies reviewed with patient and updated if appropriate.  Patient Active Problem List   Diagnosis Date Noted   SVT (supraventricular tachycardia) ([place]) 08/30/2020   Palpitations 08/31/2019   Anemia, chronic disease 08/31/2019   Educated about COVID-19 virus infection 08/31/2019   History of left hip replacement, 03/2019 08/24/2019   CKD (chronic kidney disease), stage III ([place]) 08/13/2019   Preventive measure 05/14/2019   OA (osteoarthritis) of hip 03/02/2019   Left hip pain 01/07/2019   Iron deficiency anemia 11/21/2017   Vaginal atrophy 10/15/2017   B12 deficiency 02/26/2017   Bone lesion 09/18/2016   Urinary incontinence 04/11/2016   Vitamin D deficiency 10/02/2015   Sinus infection 07/20/2015   Other fatigue 04/07/2015   History of colonic polyps 08/06/2014   Thrombocytopenia due to drugs 02/18/2014   Leukopenia due to antineoplastic chemotherapy ([place]) 02/18/2014    Right knee DJD 02/27/2013   History of breast cancer 07/18/2012   Osteopenia 09/15/2011   Diarrhea 08/29/2011   Family history of malignant neoplasm of gastrointestinal tract 08/29/2011   Pancytopenia due to antineoplastic chemotherapy ([place]) 04/14/2009   DIVERTICULOSIS OF COLON 04/06/2008   Chronic myeloid leukemia ([place]) 02/18/2007   ROSACEA 02/18/2007    Current Outpatient Medications on File Prior to Visit  Medication Sig Dispense Refill   benzonatate (TESSALON PERLES) 100 MG capsule 1-2 capsules up to twice daily as needed for cough. 30 capsule 0   cholecalciferol (VITAMIN D3) 25 MCG (1000 UT) tablet Take 1,000 Units by mouth daily.     Cyanocobalamin (B-12) 5000 MCG CAPS Take 1 tablet by mouth daily.     estradiol (ESTRACE) 0.1 MG/GM vaginal cream INSERT 1 APPLICATORFUL VAGINALLY 2 TIMES WEEKLY 42.5 g 0   FINACEA 15 % cream Apply 1 application topically daily.  2   folic acid (FOLVITE) 1 MG tablet TAKE 1 TABLET(1 MG) BY MOUTH DAILY 90 tablet 1   Glucosamine-Chondroit-Vit C-Mn (GLUCOSAMINE CHONDR 1500 COMPLX) CAPS Take 1 capsule by mouth daily.     imatinib (GLEEVEC) 100 MG tablet TAKE 3 TABLETS BY MOUTH EVERY DAY. TAKE WITH MEALS AND LARGE GLASS OF WATER. CAUTION: CHEMOTHERAPY. 270 tablet 11   Loperamide HCl (IMODIUM PO) Take 0.5-1 tablets by mouth 2 (two) times daily as needed (takes 0.5 tablet every morning and 2nd dose if needed for diarrhea).      metoprolol tartrate (LOPRESSOR) 25  MG tablet Take 0.5 tablet every 8 hours as needed 90 tablet 1   Multiple Vitamin (MULTIVITAMIN) tablet Take 1 tablet by mouth daily.     Multiple Vitamins-Minerals (PRESERVISION AREDS 2) CAPS Take 1 capsule by mouth 2 (two) times a day.     Probiotic Product (ALIGN) 4 MG CAPS Take 1 capsule by mouth every other day.      No current facility-administered medications on file prior to visit.    Past Medical History:  Diagnosis Date   Anemia requiring transfusions 2010 & 2015   Arthritis    DJD    Breast cancer ([place]) 1993   S/P lumpectomy , radiation, & Tamoxifen   CML (chronic myeloid leukemia) ([place])    Dr Alvy Bimler;   SVT (supraventricular tachycardia) [facility])    dx june 2020 , magd by Dr Percival Spanish ; reports occasional heart flutter    Past Surgical History:  Procedure Laterality Date   ABDOMINAL HYSTERECTOMY  ~1989   no BSO, for Dysplasia    APPENDECTOMY  ~1989   BILATERAL SALPINGOOPHORECTOMY  2010   with bowel resection   BREAST SURGERY     Right lumpectomy, s/p radiation 1993 and oral  Tamoxifen x 5 years (1993-1998)    CATARACT EXTRACTION  06/08/2014   left eye;Dr Herbert Deaner   CATARACT EXTRACTION, BILATERAL Bilateral 2015   COLONOSCOPY  03/2003, last 2010   diverticulosis, Dr.Kaplan   EXCISION MORTON'S NEUROMA Left mid 80's   foot   gastrocslide     left leg; Dr Beola Cord   ileostomy reversal  2011   3 mos after above   KNEE SURGERY  mid 80's   Arthroscopy, right knee    LEG TENDON SURGERY Left 2013   "cut on leg and fixed something"   OPEN SURGICAL REPAIR OF GLUTEAL TENDON Left 10/24/2016   Procedure: Left hip bursectomy; gluteal tendon repair;  Surgeon: Gaynelle Arabian, MD;  Location: [facility];  Service: Orthopedics;  Laterality: Left;   sigmoid colon resection  2010    Dr Arlyce Dice, [place] for fistula & diverticulitis   TENDON REPAIR Left 2019   left shoulder tendon repair - surgical center    TOTAL HIP ARTHROPLASTY Left 03/02/2019   Procedure: TOTAL HIP ARTHROPLASTY ANTERIOR APPROACH;  Surgeon: Gaynelle Arabian, MD;  Location: [facility];  Service: Orthopedics;  Laterality: Left;  118min   TOTAL KNEE ARTHROPLASTY Right 06/11/2013   Procedure: RIGHT TOTAL KNEE ARTHROPLASTY;  Surgeon: Sydnee Cabal, MD;  Location: [facility];  Service: Orthopedics;  Laterality: Right;   TRIGGER FINGER RELEASE Left 2013   thumb    Social History   Socioeconomic History   Marital status: Married    Spouse name: Not on file   Number of children: 2   Years of education: Not on file   Highest  education level: Not on file  Occupational History   Occupation: retired Camera operator  Tobacco Use   Smoking status: Former    Years: 5.00    Types: Cigarettes    Quit date: 04/30/1962    Years since quitting: 59.0   Smokeless tobacco: Never   Tobacco comments:    smoked 1956-1964, up to 1/2 ppd  Vaping Use   Vaping Use: Never used  Substance and Sexual Activity   Alcohol use: Yes    Alcohol/week: 7.0 - 14.0 standard drinks    Types: 7 - 14 Glasses of wine per week    Comment: 10-29 about 10 glasses of wine weekly    Drug use: No  Sexual activity: Yes  Other Topics Concern   Not on file  Social History Narrative   Married,    Social Determinants of Health   Financial Resource Strain: Not on file  Food Insecurity: Not on file  Transportation Needs: Not on file  Physical Activity: Not on file  Stress: Not on file  Social Connections: Not on file    Family History  Problem Relation Age of Onset   Colon cancer Mother 14   Uterine cancer Sister    Diabetes Cousin    Cancer Father        Lip cancer   Aneurysm Father        AAA;S/P surgery   Breast cancer Paternal Aunt    Heart attack Paternal Uncle 35   Stroke Neg Hx    Esophageal cancer Neg Hx    Rectal cancer Neg Hx    Stomach cancer Neg Hx     Review of Systems  Constitutional:  Negative for chills, fatigue and fever.  HENT:  Positive for congestion (minimal), postnasal drip (mild) and sinus pressure (just recently resolved - only at night). Negative for ear pain and sore throat.   Respiratory:  Positive for cough (occ productive). Negative for shortness of breath and wheezing.   Gastrointestinal:  Negative for abdominal pain and nausea.  Neurological:  Negative for light-headedness and headaches.      Objective:   Vitals:   05/09/21 1507  BP: 130/70  Pulse: 72  Temp: 98 F (36.7 C)  SpO2: 97%   BP Readings from Last 3 Encounters:  05/09/21 130/70  03/10/21 134/64  03/03/21 118/74   Wt  Readings from Last 3 Encounters:  05/09/21 120 lb (54.4 kg)  04/18/21 116 lb (52.6 kg)  03/10/21 119 lb 12.8 oz (54.3 kg)   Body mass index is 22.67 kg/m.   Physical Exam    GENERAL APPEARANCE: Appears stated age, well appearing, NAD EYES: conjunctiva clear, no icterus HENT: bilateral tympanic membranes and ear canals normal, oropharynx with no erythema or exudates, trachea midline, no cervical or supraclavicular lymphadenopathy LUNGS: Unlabored breathing, good air entry bilaterally, clear to auscultation without wheeze or crackles CARDIOVASCULAR: Normal S1,S2 , no edema SKIN: Warm, dry      Assessment & Plan:    See Problem List for Assessment and Plan of chronic medical problems.

## 2021-05-09 ENCOUNTER — Other Ambulatory Visit: Payer: Self-pay

## 2021-05-09 ENCOUNTER — Encounter: Payer: Self-pay | Admitting: Internal Medicine

## 2021-05-09 ENCOUNTER — Ambulatory Visit: Payer: Medicare PPO | Admitting: Internal Medicine

## 2021-05-09 DIAGNOSIS — J0111 Acute recurrent frontal sinusitis: Secondary | ICD-10-CM | POA: Diagnosis not present

## 2021-05-09 NOTE — Assessment & Plan Note (Signed)
Subacute Symptoms started mid December and she did need visit 12/20 was diagnosed with probable sinus infection/URI and treated with doxycycline, which did help She did test positive for COVID after the visit so this could have been COVID versus possible sinus infection-it is hard to know Her symptoms have improved although she still has some mild symptoms at night only Do not recommend any other antibiotic Continue symptomatic treatment Expect symptoms to continue to improve and resolve She will call with any questions or concerns

## 2021-05-09 NOTE — Patient Instructions (Signed)
° °  Continue symptomatic treatment.      If your symptoms do not continue to improve let me know.

## 2021-06-01 ENCOUNTER — Ambulatory Visit: Payer: Medicare PPO

## 2021-06-07 ENCOUNTER — Ambulatory Visit (INDEPENDENT_AMBULATORY_CARE_PROVIDER_SITE_OTHER): Payer: Medicare PPO

## 2021-06-07 ENCOUNTER — Other Ambulatory Visit: Payer: Self-pay

## 2021-06-07 DIAGNOSIS — Z0000 Encounter for general adult medical examination without abnormal findings: Secondary | ICD-10-CM

## 2021-06-07 NOTE — Progress Notes (Signed)
Virtual Visit via Telephone Note  I connected with  [name] on 06/07/21 at  3:40 PM EST by telephone and verified that I am speaking with the correct person using two identifiers.  Location: Patient: [name] [name] Provider: Jacinta Shoe, MD. Persons participating in the virtual visit: patient/Nurse Health Advisor   I discussed the limitations, risks, security and privacy concerns of performing an evaluation and management service by telephone and the availability of in person appointments. The patient expressed understanding and agreed to proceed.  Interactive audio and video telecommunications were attempted between this nurse and patient, however failed, due to patient having technical difficulties OR patient did not have access to video capability.  We continued and completed visit with audio only.  Some vital signs may be absent or patient reported.   [name] Flow, LPN  Subjective:   [name] [name] is a 87 y.o. female who presents for Medicare Annual (Subsequent) preventive examination.  Review of Systems     Cardiac Risk Factors include: advanced age (>10men, >69 women);family history of premature cardiovascular disease     Objective:    There were no vitals filed for this visit. There is no height or weight on file to calculate BMI.  Advanced Directives 06/07/2021 04/27/2020 03/31/2019 03/02/2019 03/02/2019 02/26/2019 09/13/2018  Does Patient Have a Medical Advance Directive? Yes Yes Yes - Yes Yes Yes  Type of Advance Directive Living will;Healthcare Power of [name];Living will Living will - Living will [name] [name];Living will  Does patient want to make changes to medical advance directive? No - Patient declined No - Patient declined - No - Patient declined No - Patient declined No - Patient declined -  Copy of [name] [name] in Chart? No - copy requested - No - copy requested - - - -  Pre-existing  out of facility DNR order (yellow form or pink MOST form) - - - - - - -    Current Medications (verified) Outpatient Encounter Medications as of 06/07/2021  Medication Sig   benzonatate (TESSALON PERLES) 100 MG capsule 1-2 capsules up to twice daily as needed for cough.   cholecalciferol (VITAMIN D3) 25 MCG (1000 UT) tablet Take 1,000 Units by mouth daily.   Cyanocobalamin (B-12) 5000 MCG CAPS Take 1 tablet by mouth daily.   estradiol (ESTRACE) 0.1 MG/GM vaginal cream INSERT 1 APPLICATORFUL VAGINALLY 2 TIMES WEEKLY   FINACEA 15 % cream Apply 1 application topically daily.   folic acid (FOLVITE) 1 MG tablet TAKE 1 TABLET(1 MG) BY MOUTH DAILY   Glucosamine-Chondroit-Vit C-Mn (GLUCOSAMINE CHONDR 1500 COMPLX) CAPS Take 1 capsule by mouth daily.   imatinib (GLEEVEC) 100 MG tablet TAKE 3 TABLETS BY MOUTH EVERY DAY. TAKE WITH MEALS AND LARGE GLASS OF WATER. CAUTION: CHEMOTHERAPY.   Loperamide HCl (IMODIUM PO) Take 0.5-1 tablets by mouth 2 (two) times daily as needed (takes 0.5 tablet every morning and 2nd dose if needed for diarrhea).    metoprolol tartrate (LOPRESSOR) 25 MG tablet Take 0.5 tablet every 8 hours as needed   Multiple Vitamin (MULTIVITAMIN) tablet Take 1 tablet by mouth daily.   Multiple Vitamins-Minerals (PRESERVISION AREDS 2) CAPS Take 1 capsule by mouth 2 (two) times a day.   Probiotic Product (ALIGN) 4 MG CAPS Take 1 capsule by mouth every other day.    No facility-administered encounter medications on file as of 06/07/2021.    Allergies (verified) Penicillins   History: Past Medical History:  Diagnosis Date  Anemia requiring transfusions 2010 & 2015   Arthritis    DJD   Breast cancer ([place]) 1993   S/P lumpectomy , radiation, & Tamoxifen   CML (chronic myeloid leukemia) ([place])    Dr Alvy Bimler;   SVT (supraventricular tachycardia) [facility])    dx june 2020 , magd by Dr Percival Spanish ; reports occasional heart flutter   Past Surgical History:  Procedure Laterality Date   ABDOMINAL  HYSTERECTOMY  ~1989   no BSO, for Dysplasia    APPENDECTOMY  ~1989   BILATERAL SALPINGOOPHORECTOMY  2010   with bowel resection   BREAST SURGERY     Right lumpectomy, s/p radiation 1993 and oral  Tamoxifen x 5 years (1993-1998)    CATARACT EXTRACTION  06/08/2014   left eye;Dr Herbert Deaner   CATARACT EXTRACTION, BILATERAL Bilateral 2015   COLONOSCOPY  03/2003, last 2010   diverticulosis, Dr.Kaplan   EXCISION MORTON'S NEUROMA Left mid 80's   foot   gastrocslide     left leg; Dr Beola Cord   ileostomy reversal  2011   3 mos after above   KNEE SURGERY  mid 80's   Arthroscopy, right knee    LEG TENDON SURGERY Left 2013   "cut on leg and fixed something"   OPEN SURGICAL REPAIR OF GLUTEAL TENDON Left 10/24/2016   Procedure: Left hip bursectomy; gluteal tendon repair;  Surgeon: Gaynelle Arabian, MD;  Location: [facility];  Service: Orthopedics;  Laterality: Left;   sigmoid colon resection  2010    Dr Arlyce Dice, [name] [name] for fistula & diverticulitis   TENDON REPAIR Left 2019   left shoulder tendon repair - surgical center    TOTAL HIP ARTHROPLASTY Left 03/02/2019   Procedure: TOTAL HIP ARTHROPLASTY ANTERIOR APPROACH;  Surgeon: Gaynelle Arabian, MD;  Location: [facility];  Service: Orthopedics;  Laterality: Left;  138min   TOTAL KNEE ARTHROPLASTY Right 06/11/2013   Procedure: RIGHT TOTAL KNEE ARTHROPLASTY;  Surgeon: Sydnee Cabal, MD;  Location: [facility];  Service: Orthopedics;  Laterality: Right;   TRIGGER FINGER RELEASE Left 2013   thumb   Family History  Problem Relation Age of Onset   Colon cancer Mother 68   Uterine cancer Sister    Diabetes Cousin    Cancer Father        Lip cancer   Aneurysm Father        AAA;S/P surgery   Breast cancer Paternal Aunt    Heart attack Paternal Uncle 3   Stroke Neg Hx    Esophageal cancer Neg Hx    Rectal cancer Neg Hx    Stomach cancer Neg Hx    Social History   Socioeconomic History   Marital status: Married    Spouse name: Not on file   Number of children:  2   Years of education: Not on file   Highest education level: Not on file  Occupational History   Occupation: retired Camera operator  Tobacco Use   Smoking status: Former    Years: 5.00    Types: Cigarettes    Quit date: 04/30/1962    Years since quitting: 59.1   Smokeless tobacco: Never   Tobacco comments:    smoked 1956-1964, up to 1/2 ppd  Vaping Use   Vaping Use: Never used  Substance and Sexual Activity   Alcohol use: Yes    Alcohol/week: 7.0 - 14.0 standard drinks    Types: 7 - 14 Glasses of wine per week    Comment: 10-29 about 10 glasses of wine weekly  Drug use: No   Sexual activity: Yes  Other Topics Concern   Not on file  Social History Narrative   Married,    Social Determinants of Health   Financial Resource Strain: Low Risk    Difficulty of Paying Living Expenses: Not hard at all  Food Insecurity: No Food Insecurity   Worried About Charity fundraiser in the Last Year: Never true   Arboriculturist in the Last Year: Never true  Transportation Needs: No Transportation Needs   Lack of Transportation (Medical): No   Lack of Transportation (Non-Medical): No  Physical Activity: Sufficiently Active   Days of Exercise per Week: 5 days   Minutes of Exercise per Session: 30 min  Stress: No Stress Concern Present   Feeling of Stress : Not at all  Social Connections: Socially Integrated   Frequency of Communication with Friends and Family: More than three times a week   Frequency of Social Gatherings with Friends and Family: Never   Attends Religious Services: More than 4 times per year   Active Member of [employer] or Organizations: No   Attends Music therapist: More than 4 times per year   Marital Status: Married    Tobacco Counseling Counseling given: Not Answered Tobacco comments: smoked 1956-1964, up to 1/2 ppd   Clinical Intake:     Pain : No/denies pain     Nutritional Risks: None Diabetes: No  How often do you need to have  someone help you when you read instructions, pamphlets, or other written materials from your doctor or pharmacy?: 1 - Never What is the last grade level you completed in school?: Master Degree  Diabetic? no  Interpreter Needed?: No  Information entered by :: Lisette Abu, LPN   Activities of Daily Living In your present state of health, do you have any difficulty performing the following activities: 06/07/2021 05/09/2021  Hearing? N N  Vision? N N  Difficulty concentrating or making decisions? N N  Walking or climbing stairs? N N  Dressing or bathing? N N  Doing errands, shopping? N N  Preparing Food and eating ? N -  Using the Toilet? N -  In the past six months, have you accidently leaked urine? Y -  Comment mostly at night -  Do you have problems with loss of bowel control? N -  Managing your Medications? N -  Managing your Finances? N -  Housekeeping or managing your Housekeeping? N -  Some recent data might be hidden    Patient Care Team: Binnie Rail, MD as PCP - General (Internal Medicine) Minus Breeding, MD as PCP - Cardiology (Cardiology) Gaynelle Arabian, MD as Consulting Physician (Orthopedic Surgery) Heath Lark, MD as Consulting Physician (Hematology and Oncology) Monna Fam, MD as Consulting Physician (Ophthalmology)  Indicate any recent Medical Services you may have received from other than [facility] providers in the past year (date may be approximate).     Assessment:   This is a routine wellness examination for [place].  Hearing/Vision screen Hearing Screening - Comments:: Patient denied any hearing difficulty.   No hearing aids.  Vision Screening - Comments:: Patient wears corrective glasses/contacts.   Eye exam done by: Monna Fam, MD.  Dietary issues and exercise activities discussed: Current Exercise Habits: Home exercise routine, Type of exercise: walking, Time (Minutes): 30, Frequency (Times/Week): 5, Weekly Exercise (Minutes/Week): 150,  Intensity: Moderate, Exercise limited by: orthopedic condition(s)   Goals Addressed  This Visit's Progress     Patient Stated (pt-stated)        Patient declined health goal at this time.      Depression Screen PHQ 2/9 Scores 06/07/2021 04/27/2020 03/31/2019 02/26/2017 02/17/2016 08/06/2014 07/27/2013  PHQ - 2 Score 0 0 0 0 0 0 0    Fall Risk Fall Risk  06/07/2021 05/09/2021 04/27/2020 03/31/2019 02/26/2017  Falls in the past year? 0 0 0 1 No  Number falls in past yr: 0 0 0 0 -  Injury with Fall? 0 0 0 0 -  Risk for fall due to : No Fall Risks No Fall Risks No Fall Risks History of fall(s) -  Follow up Falls evaluation completed Falls evaluation completed Falls evaluation completed;Education provided Falls prevention discussed -    FALL RISK PREVENTION PERTAINING TO THE HOME:  Any stairs in or around the home? Yes  If so, are there any without handrails? No  Home free of loose throw rugs in walkways, pet beds, electrical cords, etc? Yes  Adequate lighting in your home to reduce risk of falls? Yes   ASSISTIVE DEVICES UTILIZED TO PREVENT FALLS:  Life alert? No  Use of a cane, walker or w/c? No  Grab bars in the bathroom? No  Shower chair or bench in shower? No  Elevated toilet seat or a handicapped toilet? No   TIMED UP AND GO:  Was the test performed? No .  Length of time to ambulate 10 feet: n/a sec.   Gait steady and fast without use of assistive device  Cognitive Function: Normal cognitive status assessed by direct observation by this Nurse Health Advisor. No abnormalities found.          Immunizations Immunization History  Administered Date(s) Administered   Fluad Quad(high Dose 65+) 02/06/2019, 02/01/2020   Influenza Split 02/17/2015   Influenza Whole 02/29/2012   Influenza, High Dose Seasonal PF 02/17/2016   Influenza,inj,Quad PF,6+ Mos 04/15/2018   Influenza,inj,quad, With Preservative 01/28/2017   Influenza-Unspecified 03/14/2013, 01/19/2014,  01/17/2017, 02/16/2021   PFIZER(Purple Top)SARS-COV-2 Vaccination 06/07/2019, 07/02/2019   Pneumococcal Conjugate-13 03/01/2015   Pneumococcal Polysaccharide-23 03/30/2002, 09/29/2002, 03/11/2012   Td 04/30/2006    TDAP status: Due, Education has been provided regarding the importance of this vaccine. Advised may receive this vaccine at local pharmacy or Health Dept. Aware to provide a copy of the vaccination record if obtained from local pharmacy or Health Dept. Verbalized acceptance and understanding.  Flu Vaccine status: Up to date  Pneumococcal vaccine status: Up to date  Covid-19 vaccine status: Completed vaccines  Qualifies for Shingles Vaccine? Yes   Zostavax completed Yes   Shingrix Completed?: No.    Education has been provided regarding the importance of this vaccine. Patient has been advised to call insurance company to determine out of pocket expense if they have not yet received this vaccine. Advised may also receive vaccine at local pharmacy or Health Dept. Verbalized acceptance and understanding.  Screening Tests Health Maintenance  Topic Date Due   Zoster Vaccines- Shingrix (1 of 2) Never done   COVID-19 Vaccine (3 - Pfizer risk series) 07/30/2019   DEXA SCAN  02/19/2020   TETANUS/TDAP  03/03/2022 (Originally 04/30/2016)   Pneumonia Vaccine 65+ Years old  Completed   INFLUENZA VACCINE  Completed   HPV VACCINES  Aged Out    Health Maintenance  Health Maintenance Due  Topic Date Due   Zoster Vaccines- Shingrix (1 of 2) Never done   COVID-19 Vaccine (3 - Pfizer risk  series) 07/30/2019   DEXA SCAN  02/19/2020    Colorectal cancer screening: No longer required.   Mammogram status: Completed 06/23/2020. Repeat every year  Bone Density status: Completed 02/18/2018. Results reflect: Bone density results: OSTEOPENIA. Repeat every 2-3 years.  Lung Cancer Screening: (Low Dose CT Chest recommended if Age 42-80 years, 30 pack-year currently smoking OR have quit w/in  15years.) does not qualify.   Lung Cancer Screening Referral: no  Additional Screening:  Hepatitis C Screening: does not qualify; Completed no  Vision Screening: Recommended annual ophthalmology exams for early detection of glaucoma and other disorders of the eye. Is the patient up to date with their annual eye exam?  Yes  Who is the provider or what is the name of the office in which the patient attends annual eye exams? Monna Fam, MD. If pt is not established with a provider, would they like to be referred to a provider to establish care? No .   Dental Screening: Recommended annual dental exams for proper oral hygiene  Community Resource Referral / Chronic Care Management: CRR required this visit?  No   CCM required this visit?  No      Plan:     I have personally reviewed and noted the following in the patients chart:   Medical and social history Use of alcohol, tobacco or illicit drugs  Current medications and supplements including opioid prescriptions.  Functional ability and status Nutritional status Physical activity Advanced directives List of other physicians Hospitalizations, surgeries, and ER visits in previous 12 months Vitals Screenings to include cognitive, depression, and falls Referrals and appointments  In addition, I have reviewed and discussed with patient certain preventive protocols, quality metrics, and best practice recommendations. A written personalized care plan for preventive services as well as general preventive health recommendations were provided to patient.     [name] Flow, LPN   06/01/252   Nurse Notes:  Patient is cogitatively intact. There were no vitals filed for this visit. There is no height or weight on file to calculate BMI. Patient stated that she has no issues with gait or balance; does not use any assistive devices. Medications reviewed with patient; no opioid use noted. Patient scheduled for mammogram and bone  density for 05/2021.

## 2021-06-07 NOTE — Patient Instructions (Signed)
[name] [name] , Thank you for taking time to come for your Medicare Wellness Visit. I appreciate your ongoing commitment to your health goals. Please review the following plan we discussed and let me know if I can assist you in the future.   Screening recommendations/referrals: Colonoscopy: Not a candidate for screening due to age 87: 06/23/2020; scheduled for 05/2021 per patient Bone Density: 02/18/2018; scheduled for 05/2021 per patient Recommended yearly ophthalmology/optometry visit for glaucoma screening and checkup Recommended yearly dental visit for hygiene and checkup  Vaccinations: Influenza vaccine: 02/16/2021; due every Fall season Pneumococcal vaccine: 03/11/2012, 03/01/2015 Tdap vaccine: 04/30/2006; due every 10 years (overdue) Shingles vaccine: never done   Covid-19: 06/07/2019, 07/02/2019, 02/08/2020, 09/20/2020, 03/30/2021  Advanced directives: Please bring a copy of your health care power of attorney and living will to the office at your convenience.  Conditions/risks identified: Yes; Client understands the importance of follow-up with providers by attending scheduled visits and discussed goals to eat healthier, increase physical activity, exercise the brain, socialize more, get enough sleep and make time for laughter.  Next appointment: Please schedule your next Medicare Wellness Visit with your Nurse Health Advisor in 1 year by calling [phone].   Preventive Care 4 Years and Older, Female Preventive care refers to lifestyle choices and visits with your health care provider that can promote health and wellness. What does preventive care include? A yearly physical exam. This is also called an annual well check. Dental exams once or twice a year. Routine eye exams. Ask your health care provider how often you should have your eyes checked. Personal lifestyle choices, including: Daily care of your teeth and gums. Regular physical activity. Eating a healthy diet. Avoiding  tobacco and drug use. Limiting alcohol use. Practicing safe sex. Taking low-dose aspirin every day. Taking vitamin and mineral supplements as recommended by your health care provider. What happens during an annual well check? The services and screenings done by your health care provider during your annual well check will depend on your age, overall health, lifestyle risk factors, and family history of disease. Counseling  Your health care provider may ask you questions about your: Alcohol use. Tobacco use. Drug use. Emotional well-being. Home and relationship well-being. Sexual activity. Eating habits. History of falls. Memory and ability to understand (cognition). Work and work Statistician. Reproductive health. Screening  You may have the following tests or measurements: Height, weight, and BMI. Blood pressure. Lipid and cholesterol levels. These may be checked every 5 years, or more frequently if you are over 36 years old. Skin check. Lung cancer screening. You may have this screening every year starting at age 69 if you have a 30-pack-year history of smoking and currently smoke or have quit within the past 15 years. Fecal occult blood test (FOBT) of the stool. You may have this test every year starting at age 61. Flexible sigmoidoscopy or colonoscopy. You may have a sigmoidoscopy every 5 years or a colonoscopy every 10 years starting at age 13. Hepatitis C blood test. Hepatitis B blood test. Sexually transmitted disease (STD) testing. Diabetes screening. This is done by checking your blood sugar (glucose) after you have not eaten for a while (fasting). You may have this done every 1-3 years. Bone density scan. This is done to screen for osteoporosis. You may have this done starting at age 85. Mammogram. This may be done every 1-2 years. Talk to your health care provider about how often you should have regular mammograms. Talk with your health care provider about  your test  results, treatment options, and if necessary, the need for more tests. Vaccines  Your health care provider may recommend certain vaccines, such as: Influenza vaccine. This is recommended every year. Tetanus, diphtheria, and acellular pertussis (Tdap, Td) vaccine. You may need a Td booster every 10 years. Zoster vaccine. You may need this after age 84. Pneumococcal 13-valent conjugate (PCV13) vaccine. One dose is recommended after age 33. Pneumococcal polysaccharide (PPSV23) vaccine. One dose is recommended after age 70. Talk to your health care provider about which screenings and vaccines you need and how often you need them. This information is not intended to replace advice given to you by your health care provider. Make sure you discuss any questions you have with your health care provider. Document Released: 05/13/2015 Document Revised: 01/04/2016 Document Reviewed: 02/15/2015 Elsevier Interactive Patient Education  2017 [place] Prevention in the Home Falls can cause injuries. They can happen to people of all ages. There are many things you can do to make your home safe and to help prevent falls. What can I do on the outside of my home? Regularly fix the edges of walkways and driveways and fix any cracks. Remove anything that might make you trip as you walk through a door, such as a raised step or threshold. Trim any bushes or trees on the path to your home. Use bright outdoor lighting. Clear any walking paths of anything that might make someone trip, such as rocks or tools. Regularly check to see if handrails are loose or broken. Make sure that both sides of any steps have handrails. Any raised decks and porches should have guardrails on the edges. Have any leaves, snow, or ice cleared regularly. Use sand or salt on walking paths during winter. Clean up any spills in your garage right away. This includes oil or grease spills. What can I do in the bathroom? Use night  lights. Install grab bars by the toilet and in the tub and shower. Do not use towel bars as grab bars. Use non-skid mats or decals in the tub or shower. If you need to sit down in the shower, use a plastic, non-slip stool. Keep the floor dry. Clean up any water that spills on the floor as soon as it happens. Remove soap buildup in the tub or shower regularly. Attach bath mats securely with double-sided non-slip rug tape. Do not have throw rugs and other things on the floor that can make you trip. What can I do in the bedroom? Use night lights. Make sure that you have a light by your bed that is easy to reach. Do not use any sheets or blankets that are too big for your bed. They should not hang down onto the floor. Have a firm chair that has side arms. You can use this for support while you get dressed. Do not have throw rugs and other things on the floor that can make you trip. What can I do in the kitchen? Clean up any spills right away. Avoid walking on wet floors. Keep items that you use a lot in easy-to-reach places. If you need to reach something above you, use a strong step stool that has a grab bar. Keep electrical cords out of the way. Do not use floor polish or wax that makes floors slippery. If you must use wax, use non-skid floor wax. Do not have throw rugs and other things on the floor that can make you trip. What can I do with  my stairs? Do not leave any items on the stairs. Make sure that there are handrails on both sides of the stairs and use them. Fix handrails that are broken or loose. Make sure that handrails are as long as the stairways. Check any carpeting to make sure that it is firmly attached to the stairs. Fix any carpet that is loose or worn. Avoid having throw rugs at the top or bottom of the stairs. If you do have throw rugs, attach them to the floor with carpet tape. Make sure that you have a light switch at the top of the stairs and the bottom of the stairs. If  you do not have them, ask someone to add them for you. What else can I do to help prevent falls? Wear shoes that: Do not have high heels. Have rubber bottoms. Are comfortable and fit you well. Are closed at the toe. Do not wear sandals. If you use a stepladder: Make sure that it is fully opened. Do not climb a closed stepladder. Make sure that both sides of the stepladder are locked into place. Ask someone to hold it for you, if possible. Clearly mark and make sure that you can see: Any grab bars or handrails. First and last steps. Where the edge of each step is. Use tools that help you move around (mobility aids) if they are needed. These include: Canes. Walkers. Scooters. Crutches. Turn on the lights when you go into a dark area. Replace any light bulbs as soon as they burn out. Set up your furniture so you have a clear path. Avoid moving your furniture around. If any of your floors are uneven, fix them. If there are any pets around you, be aware of where they are. Review your medicines with your doctor. Some medicines can make you feel dizzy. This can increase your chance of falling. Ask your doctor what other things that you can do to help prevent falls. This information is not intended to replace advice given to you by your health care provider. Make sure you discuss any questions you have with your health care provider. Document Released: 02/10/2009 Document Revised: 09/22/2015 Document Reviewed: 05/21/2014 Elsevier Interactive Patient Education  2017 [employer].

## 2021-06-12 ENCOUNTER — Other Ambulatory Visit: Payer: Self-pay | Admitting: Internal Medicine

## 2021-06-23 ENCOUNTER — Telehealth: Payer: Self-pay | Admitting: Internal Medicine

## 2021-06-23 DIAGNOSIS — Z1231 Encounter for screening mammogram for malignant neoplasm of breast: Secondary | ICD-10-CM

## 2021-06-23 NOTE — Telephone Encounter (Signed)
Faxed today and conformation recieved

## 2021-06-23 NOTE — Telephone Encounter (Signed)
[name] [name] has called from [place] and is requesting that referral be signed. States pt made the appointment herself- without a referral.   Informed her that I'm not sure who would sign it, because there's no active referral.    Fax #- [phone]  Callback #- [phone]

## 2021-06-23 NOTE — Telephone Encounter (Signed)
Okay to have 1 done.  Screening mammogram ordered-just need to make sure it gets over there.

## 2021-06-26 DIAGNOSIS — Z1231 Encounter for screening mammogram for malignant neoplasm of breast: Secondary | ICD-10-CM | POA: Diagnosis not present

## 2021-06-26 LAB — HM MAMMOGRAPHY

## 2021-07-05 DIAGNOSIS — Z96642 Presence of left artificial hip joint: Secondary | ICD-10-CM | POA: Diagnosis not present

## 2021-07-05 DIAGNOSIS — M25552 Pain in left hip: Secondary | ICD-10-CM | POA: Diagnosis not present

## 2021-07-20 ENCOUNTER — Encounter: Payer: Self-pay | Admitting: Internal Medicine

## 2021-07-20 NOTE — Progress Notes (Signed)
Outside notes received. Information abstracted. Notes sent to scan.  

## 2021-08-15 DIAGNOSIS — L718 Other rosacea: Secondary | ICD-10-CM | POA: Diagnosis not present

## 2021-08-15 DIAGNOSIS — L821 Other seborrheic keratosis: Secondary | ICD-10-CM | POA: Diagnosis not present

## 2021-08-15 DIAGNOSIS — L308 Other specified dermatitis: Secondary | ICD-10-CM | POA: Diagnosis not present

## 2021-08-15 DIAGNOSIS — D2271 Melanocytic nevi of right lower limb, including hip: Secondary | ICD-10-CM | POA: Diagnosis not present

## 2021-08-15 DIAGNOSIS — D225 Melanocytic nevi of trunk: Secondary | ICD-10-CM | POA: Diagnosis not present

## 2021-08-15 DIAGNOSIS — D2262 Melanocytic nevi of left upper limb, including shoulder: Secondary | ICD-10-CM | POA: Diagnosis not present

## 2021-08-15 DIAGNOSIS — D2261 Melanocytic nevi of right upper limb, including shoulder: Secondary | ICD-10-CM | POA: Diagnosis not present

## 2021-08-15 DIAGNOSIS — D2272 Melanocytic nevi of left lower limb, including hip: Secondary | ICD-10-CM | POA: Diagnosis not present

## 2021-08-27 ENCOUNTER — Other Ambulatory Visit: Payer: Self-pay | Admitting: Hematology and Oncology

## 2021-08-27 DIAGNOSIS — C9210 Chronic myeloid leukemia, BCR/ABL-positive, not having achieved remission: Secondary | ICD-10-CM

## 2021-08-28 ENCOUNTER — Inpatient Hospital Stay: Payer: Medicare PPO | Attending: Hematology and Oncology

## 2021-08-28 ENCOUNTER — Other Ambulatory Visit: Payer: Self-pay

## 2021-08-28 DIAGNOSIS — Z79899 Other long term (current) drug therapy: Secondary | ICD-10-CM | POA: Diagnosis not present

## 2021-08-28 DIAGNOSIS — T451X5A Adverse effect of antineoplastic and immunosuppressive drugs, initial encounter: Secondary | ICD-10-CM | POA: Diagnosis not present

## 2021-08-28 DIAGNOSIS — D61810 Antineoplastic chemotherapy induced pancytopenia: Secondary | ICD-10-CM | POA: Insufficient documentation

## 2021-08-28 DIAGNOSIS — Z853 Personal history of malignant neoplasm of breast: Secondary | ICD-10-CM | POA: Diagnosis not present

## 2021-08-28 DIAGNOSIS — N183 Chronic kidney disease, stage 3 unspecified: Secondary | ICD-10-CM | POA: Insufficient documentation

## 2021-08-28 DIAGNOSIS — C9210 Chronic myeloid leukemia, BCR/ABL-positive, not having achieved remission: Secondary | ICD-10-CM | POA: Diagnosis not present

## 2021-08-28 LAB — COMPREHENSIVE METABOLIC PANEL
ALT: 17 U/L (ref 0–44)
AST: 36 U/L (ref 15–41)
Albumin: 4 g/dL (ref 3.5–5.0)
Alkaline Phosphatase: 45 U/L (ref 38–126)
Anion gap: 4 — ABNORMAL LOW (ref 5–15)
BUN: 23 mg/dL (ref 8–23)
CO2: 31 mmol/L (ref 22–32)
Calcium: 9.2 mg/dL (ref 8.9–10.3)
Chloride: 103 mmol/L (ref 98–111)
Creatinine, Ser: 1.23 mg/dL — ABNORMAL HIGH (ref 0.44–1.00)
GFR, Estimated: 43 mL/min — ABNORMAL LOW (ref >60)
Glucose, Bld: 95 mg/dL (ref 70–99)
Potassium: 4.4 mmol/L (ref 3.5–5.1)
Sodium: 138 mmol/L (ref 135–145)
Total Bilirubin: 0.6 mg/dL (ref 0.3–1.2)
Total Protein: 6.5 g/dL (ref 6.5–8.1)

## 2021-08-28 LAB — CBC WITH DIFFERENTIAL/PLATELET
Abs Immature Granulocytes: 0 10*3/uL (ref 0.00–0.07)
Basophils Absolute: 0 10*3/uL (ref 0.0–0.1)
Basophils Relative: 1 %
Eosinophils Absolute: 0.1 10*3/uL (ref 0.0–0.5)
Eosinophils Relative: 2 %
HCT: 26.4 % — ABNORMAL LOW (ref 36.0–46.0)
Hemoglobin: 9.4 g/dL — ABNORMAL LOW (ref 12.0–15.0)
Immature Granulocytes: 0 %
Lymphocytes Relative: 26 %
Lymphs Abs: 1 10*3/uL (ref 0.7–4.0)
MCH: 39.8 pg — ABNORMAL HIGH (ref 26.0–34.0)
MCHC: 35.6 g/dL (ref 30.0–36.0)
MCV: 111.9 fL — ABNORMAL HIGH (ref 80.0–100.0)
Monocytes Absolute: 0.5 10*3/uL (ref 0.1–1.0)
Monocytes Relative: 13 %
Neutro Abs: 2.2 10*3/uL (ref 1.7–7.7)
Neutrophils Relative %: 58 %
Platelets: 113 10*3/uL — ABNORMAL LOW (ref 150–400)
RBC: 2.36 MIL/uL — ABNORMAL LOW (ref 3.87–5.11)
RDW: 13 % (ref 11.5–15.5)
WBC: 3.7 10*3/uL — ABNORMAL LOW (ref 4.0–10.5)
nRBC: 0 % (ref 0.0–0.2)

## 2021-09-04 LAB — BCR/ABL

## 2021-09-05 ENCOUNTER — Inpatient Hospital Stay: Payer: Medicare PPO | Admitting: Hematology and Oncology

## 2021-09-05 ENCOUNTER — Other Ambulatory Visit: Payer: Self-pay

## 2021-09-05 ENCOUNTER — Encounter: Payer: Self-pay | Admitting: Hematology and Oncology

## 2021-09-05 DIAGNOSIS — D61810 Antineoplastic chemotherapy induced pancytopenia: Secondary | ICD-10-CM

## 2021-09-05 DIAGNOSIS — Z853 Personal history of malignant neoplasm of breast: Secondary | ICD-10-CM

## 2021-09-05 DIAGNOSIS — T451X5A Adverse effect of antineoplastic and immunosuppressive drugs, initial encounter: Secondary | ICD-10-CM

## 2021-09-05 DIAGNOSIS — C9210 Chronic myeloid leukemia, BCR/ABL-positive, not having achieved remission: Secondary | ICD-10-CM

## 2021-09-05 DIAGNOSIS — Z79899 Other long term (current) drug therapy: Secondary | ICD-10-CM | POA: Diagnosis not present

## 2021-09-05 DIAGNOSIS — N1832 Chronic kidney disease, stage 3b: Secondary | ICD-10-CM | POA: Diagnosis not present

## 2021-09-05 DIAGNOSIS — N183 Chronic kidney disease, stage 3 unspecified: Secondary | ICD-10-CM | POA: Diagnosis not present

## 2021-09-05 NOTE — Assessment & Plan Note (Signed)
Her blood count is stable So far, she tolerated treatment well without recent diarrhea, fluid retention or shortness of breath She has mild pancytopenia with treatment but she is not symptomatic BCR/ABL is not detected and she remained in major molecular response We will continue treatment indefinitely I will schedule return appointment in 6 months 

## 2021-09-05 NOTE — Assessment & Plan Note (Signed)
She continues on routine screening mammogram with normal imaging study recently ?

## 2021-09-05 NOTE — Progress Notes (Signed)
[place] ?OFFICE PROGRESS NOTE ? ?Patient Care Team: ?Binnie Rail, MD as PCP - General (Internal Medicine) ?Minus Breeding, MD as PCP - Cardiology (Cardiology) ?Gaynelle Arabian, MD as Consulting Physician (Orthopedic Surgery) ?Heath Lark, MD as Consulting Physician (Hematology and Oncology) ?Monna Fam, MD as Consulting Physician (Ophthalmology) ? ?ASSESSMENT & PLAN:  ?Chronic myeloid leukemia ([place]) ?Her blood count is stable ?So far, she tolerated treatment well without recent diarrhea, fluid retention or shortness of breath ?She has mild pancytopenia with treatment but she is not symptomatic ?BCR/ABL is not detected and she remained in major molecular response ?We will continue treatment indefinitely ?I will schedule return appointment in 6 months ? ?History of breast cancer ?She continues on routine screening mammogram with normal imaging study recently ? ?Pancytopenia due to antineoplastic chemotherapy [facility]) ?She has stable pancytopenia ?Serum vitamin B12 and thyroid function were within normal limits ?She will continue Gleevec as prescribed ? ?CKD (chronic kidney disease), stage III ?Her serum creatinine fluctuated a little bit ?She is not symptomatic ?Observe for now ? ?No orders of the defined types were placed in this encounter. ? ? ?All questions were answered. The patient knows to call the clinic with any problems, questions or concerns. ?The total time spent in the appointment was 20 minutes encounter with patients including review of chart and various tests results, discussions about plan of care and coordination of care plan ?  ?Heath Lark, MD ?09/05/2021 9:39 AM ? ?INTERVAL HISTORY: ?Please see below for problem oriented charting. ?she returns for treatment follow-up on [place] for CML ?She also have remote history of breast cancer ?She is doing well ?No recent infection ?Denies fluid retention or shortness of breath while on Gleevec ? ?REVIEW OF SYSTEMS:   ?Constitutional:  Denies fevers, chills or abnormal weight loss ?Eyes: Denies blurriness of vision ?Ears, nose, mouth, throat, and face: Denies mucositis or sore throat ?Respiratory: Denies cough, dyspnea or wheezes ?Cardiovascular: Denies palpitation, chest discomfort or lower extremity swelling ?Gastrointestinal:  Denies nausea, heartburn or change in bowel habits ?Skin: Denies abnormal skin rashes ?Lymphatics: Denies new lymphadenopathy or easy bruising ?Neurological:Denies numbness, tingling or new weaknesses ?Behavioral/Psych: Mood is stable, no new changes  ?All other systems were reviewed with the patient and are negative. ? ?I have reviewed the past medical history, past surgical history, social history and family history with the patient and they are unchanged from previous note. ? ?ALLERGIES:  is allergic to penicillins. ? ?MEDICATIONS:  ?Current Outpatient Medications  ?Medication Sig Dispense Refill  ? benzonatate (TESSALON PERLES) 100 MG capsule 1-2 capsules up to twice daily as needed for cough. 30 capsule 0  ? cholecalciferol (VITAMIN D3) 25 MCG (1000 UT) tablet Take 1,000 Units by mouth daily.    ? Cyanocobalamin (B-12) 5000 MCG CAPS Take 1 tablet by mouth daily.    ? estradiol (ESTRACE) 0.1 MG/GM vaginal cream INSERT 1 APPLICATORFUL VAGINALLY 2 TIMES WEEKLY 42.5 g 0  ? FINACEA 15 % cream Apply 1 application topically daily.  2  ? folic acid (FOLVITE) 1 MG tablet TAKE 1 TABLET(1 MG) BY MOUTH DAILY 90 tablet 1  ? Glucosamine-Chondroit-Vit C-Mn (GLUCOSAMINE CHONDR 1500 COMPLX) CAPS Take 1 capsule by mouth daily.    ? imatinib (GLEEVEC) 100 MG tablet TAKE 3 TABLETS BY MOUTH EVERY DAY WITH A MEAL AND LARGE GLASS OF WATER 270 tablet 11  ? Loperamide HCl (IMODIUM PO) Take 0.5-1 tablets by mouth 2 (two) times daily as needed (takes 0.5 tablet every morning and  2nd dose if needed for diarrhea).     ? metoprolol tartrate (LOPRESSOR) 25 MG tablet Take 0.5 tablet every 8 hours as needed 90 tablet 1  ? Multiple Vitamin  (MULTIVITAMIN) tablet Take 1 tablet by mouth daily.    ? Multiple Vitamins-Minerals (PRESERVISION AREDS 2) CAPS Take 1 capsule by mouth 2 (two) times a day.    ? Probiotic Product (ALIGN) 4 MG CAPS Take 1 capsule by mouth every other day.     ? ?No current facility-administered medications for this visit.  ? ? ?SUMMARY OF ONCOLOGIC HISTORY: ?Oncology History  ?Chronic myeloid leukemia ([place])  ?02/18/2007 Initial Diagnosis  ? Chronic myeloid leukemia ? ?  ?08/20/2014 Tumor Marker  ? BCR/ABL is undetectable ? ?  ?03/04/2015 Pathology Results  ? BCR/ABL is undetectable ? ?  ?08/29/2015 Tumor Marker  ? BCR/ABL is undetectable ?  ?02/29/2016 Pathology Results  ? BCR/ABL is undetectable ?  ?05/01/2016 Pathology Results  ? BCR/ABL b2a2 is detected at 0.03% and IS ratio 0.069% ? ?  ?05/31/2016 Pathology Results  ? BCR/ABL b2a2 is detected at 0.12% and IS ratio 0.276% ?  ?06/19/2016 Pathology Results  ? BCR/ABL b2a2 is detected at 0.24% and IS ratio 0.552% ?  ?06/19/2016 Miscellaneous  ? ABL mutation kinase testing is negative ? ?  ?06/27/2016 Miscellaneous  ? Dose of Gleevec is increased to 600 mg daily ? ?  ?07/25/2016 Pathology Results  ? BCR/ABL b2a2 is detected at 0.03% and IS ratio 0.069%. She has achieved MMR ?  ?08/30/2016 Miscellaneous  ? Dose of Gleevec is reduced to 300 mg daily and subsequently down to 200 mg daily ? ?  ?10/22/2016 Pathology Results  ? BCR/ABL b2a2 is not detected. She has achieved MMR ?  ?01/03/2017 Pathology Results  ? BCR/ABL b2a2 is detected IS ratio 0.041% ?  ?02/18/2017 Pathology Results  ? BCR/ABL b2a2 is detected IS ratio 0.0126 ?  ?05/21/2017 Pathology Results  ? BCR/ABL b2a2 is not detected. She has achieved MMR ?  ?08/20/2017 Pathology Results  ? BCR/ABL b2a2 is not detected. She has achieved MMR ?  ?11/12/2017 Pathology Results  ? BCR/ABL b2a2 is not detected ?  ?04/07/2018 Pathology Results  ? BCR/ABL b2a2 is detected IS ratio 0.0062. She is in MMR ?  ?06/27/2018 Pathology Results  ? BCR/ABL b2a2 is  detected IS ratio 0.0097%. She is in MMR ?  ?08/29/2018 Pathology Results  ? BCR/ABL is undetectable. She is in MMR ?  ?12/26/2018 Pathology Results  ? BCR/ABL b2a2 is detected IS ratio 0.0042%. She is in MMR ?  ?05/04/2019 Pathology Results  ? BCR/ABL b2a2 is detected IS ratio 0.0575%. She is in MMR ?  ?08/03/2019 Pathology Results  ? BCR/ABL b2a2 is detected IS ratio 0.0167%. She is in MMR ?  ?02/01/2020 Pathology Results  ? BCR/ABL is undetectable ?  ?08/01/2020 Pathology Results  ? BCR/ABL is undetectable ?  ?02/28/2021 Pathology Results  ? BCR/ABL is undetectable ?  ?09/04/2021 Pathology Results  ? BCR/ABL is undetectable ?  ? ? ?PHYSICAL EXAMINATION: ?ECOG PERFORMANCE STATUS: 0 - Asymptomatic ? ?Vitals:  ? 09/05/21 0915  ?BP: 116/64  ?Pulse: 75  ?Resp: 16  ?Temp: 97.7 ?F (36.5 ?C)  ?SpO2: 98%  ? ?Filed Weights  ? 09/05/21 0915  ?Weight: 122 lb 12.8 oz (55.7 kg)  ? ? ?GENERAL:alert, no distress and comfortable ?SKIN: skin color, texture, turgor are normal, no rashes or significant lesions ?EYES: normal, Conjunctiva are pink and non-injected, sclera clear ?OROPHARYNX:no exudate, no erythema  and lips, buccal mucosa, and tongue normal  ?NECK: supple, thyroid normal size, non-tender, without nodularity ?LYMPH:  no palpable lymphadenopathy in the cervical, axillary or inguinal ?LUNGS: clear to auscultation and percussion with normal breathing effort ?HEART: regular rate & rhythm and no murmurs and no lower extremity edema ?ABDOMEN:abdomen soft, non-tender and normal bowel sounds ?Musculoskeletal:no cyanosis of digits and no clubbing  ?NEURO: alert & oriented x 3 with fluent speech, no focal motor/sensory deficits ? ?LABORATORY DATA:  ?I have reviewed the data as listed ?   ?Component Value Date/Time  ? NA 138 08/28/2021 0915  ? NA 140 02/18/2017 0808  ? K 4.4 08/28/2021 0915  ? K 4.4 02/18/2017 0808  ? CL 103 08/28/2021 0915  ? CL 105 09/04/2012 1006  ? CO2 31 08/28/2021 0915  ? CO2 29 02/18/2017 0808  ? GLUCOSE 95 08/28/2021  0915  ? GLUCOSE 86 02/18/2017 0808  ? GLUCOSE 96 09/04/2012 1006  ? BUN 23 08/28/2021 0915  ? BUN 20.3 02/18/2017 0808  ? CREATININE 1.23 (H) 08/28/2021 0915  ? CREATININE 0.93 05/04/2019 1108  ? CREATININE 1.1 02/18/2017 0

## 2021-09-05 NOTE — Assessment & Plan Note (Signed)
She has stable pancytopenia Serum vitamin B12 and thyroid function were within normal limits She will continue Gleevec as prescribed 

## 2021-09-05 NOTE — Assessment & Plan Note (Signed)
Her serum creatinine fluctuated a little bit She is not symptomatic Observe for now 

## 2021-09-07 DIAGNOSIS — N898 Other specified noninflammatory disorders of vagina: Secondary | ICD-10-CM | POA: Diagnosis not present

## 2021-09-07 DIAGNOSIS — L292 Pruritus vulvae: Secondary | ICD-10-CM | POA: Diagnosis not present

## 2021-09-07 DIAGNOSIS — L900 Lichen sclerosus et atrophicus: Secondary | ICD-10-CM | POA: Diagnosis not present

## 2021-09-07 DIAGNOSIS — N952 Postmenopausal atrophic vaginitis: Secondary | ICD-10-CM | POA: Diagnosis not present

## 2022-01-22 ENCOUNTER — Ambulatory Visit: Payer: Medicare PPO | Admitting: Cardiology

## 2022-01-23 ENCOUNTER — Ambulatory Visit: Payer: Medicare PPO | Admitting: Cardiology

## 2022-01-27 NOTE — Progress Notes (Unsigned)
Cardiology Office Note   Date:  01/27/2022   ID:  [name] [name], DOB [birth year], MRN [id number]  PCP:  [name] Rail, MD  Cardiologist:   [name] Breeding, MD  No chief complaint on file.     History of Present Illness: [name] [name] is a 87 y.o. female who presents for evaluation of palpitations.  She wore a monitor and she had rare SVT.  I added beta blocker.  She weaned off the beta blocker.  ***   ***   ince I last saw her she was weaned off of her beta-blocker and really has not had much in the way of palpitations other than perhaps 1 fleeting event.  She walks routinely for exercise.  The patient denies any new symptoms such as chest discomfort, neck or arm discomfort. There has been no new shortness of breath, PND or orthopnea. There have been no reported palpitations, presyncope or syncope.  She and her husband headed up to [place] to her [place] soon.  Past Medical History:  Diagnosis Date   Anemia requiring transfusions 2010 & 2015   Arthritis    DJD   Breast cancer ([place]) 1993   S/P lumpectomy , radiation, & Tamoxifen   CML (chronic myeloid leukemia) ([name] [name])    [name] [name] [name];   SVT (supraventricular tachycardia) [facility])    dx june 2020 , magd by [name] Percival [name] [name] ; reports occasional heart flutter    Past Surgical History:  Procedure Laterality Date   ABDOMINAL HYSTERECTOMY  ~1989   no BSO, for Dysplasia    APPENDECTOMY  ~1989   BILATERAL SALPINGOOPHORECTOMY  2010   with bowel resection   BREAST SURGERY     Right lumpectomy, s/p radiation 1993 and oral  Tamoxifen x 5 years (1993-1998)    CATARACT EXTRACTION  06/08/2014   left eye;[name] [name]   CATARACT EXTRACTION, BILATERAL Bilateral 2015   COLONOSCOPY  03/2003, last 2010   diverticulosis, [name].Kaplan   EXCISION MORTON'S NEUROMA Left mid 80's   foot   gastrocslide     left leg; [name] [name]   ileostomy reversal  2011   3 mos after above   KNEE SURGERY  mid 80's   Arthroscopy, right knee    LEG TENDON  SURGERY Left 2013   "cut on leg and fixed something"   OPEN SURGICAL REPAIR OF GLUTEAL TENDON Left 10/24/2016   Procedure: Left hip bursectomy; gluteal tendon repair;  Surgeon: [name] Arabian, MD;  Location: [facility];  Service: Orthopedics;  Laterality: Left;   sigmoid colon resection  2010    [name] [name], [place] for fistula & diverticulitis   TENDON REPAIR Left 2019   left shoulder tendon repair - surgical center    TOTAL HIP ARTHROPLASTY Left 03/02/2019   Procedure: TOTAL HIP ARTHROPLASTY ANTERIOR APPROACH;  Surgeon: [name] Arabian, MD;  Location: [facility];  Service: Orthopedics;  Laterality: Left;  164mn   TOTAL KNEE ARTHROPLASTY Right 06/11/2013   Procedure: RIGHT TOTAL KNEE ARTHROPLASTY;  Surgeon: [name] Cabal MD;  Location: [facility];  Service: Orthopedics;  Laterality: Right;   TRIGGER FINGER RELEASE Left 2013   thumb     Current Outpatient Medications  Medication Sig Dispense Refill   benzonatate (TESSALON PERLES) 100 MG capsule 1-2 capsules up to twice daily as needed for cough. 30 capsule 0   cholecalciferol (VITAMIN D3) 25 MCG (1000 UT) tablet Take 1,000 Units by mouth daily.     Cyanocobalamin (B-12) 5000 MCG CAPS Take 1 tablet  by mouth daily.     estradiol (ESTRACE) 0.1 MG/GM vaginal cream INSERT 1 APPLICATORFUL VAGINALLY 2 TIMES WEEKLY 42.5 g 0   FINACEA 15 % cream Apply 1 application topically daily.  2   folic acid (FOLVITE) 1 MG tablet TAKE 1 TABLET(1 MG) BY MOUTH DAILY 90 tablet 1   Glucosamine-Chondroit-Vit C-Mn (GLUCOSAMINE CHONDR 1500 COMPLX) CAPS Take 1 capsule by mouth daily.     imatinib (GLEEVEC) 100 MG tablet TAKE 3 TABLETS BY MOUTH EVERY DAY WITH A MEAL AND LARGE GLASS OF WATER 270 tablet 11   Loperamide HCl (IMODIUM PO) Take 0.5-1 tablets by mouth 2 (two) times daily as needed (takes 0.5 tablet every morning and 2nd dose if needed for diarrhea).      metoprolol tartrate (LOPRESSOR) 25 MG tablet Take 0.5 tablet every 8 hours as needed 90 tablet 1   Multiple Vitamin  (MULTIVITAMIN) tablet Take 1 tablet by mouth daily.     Multiple Vitamins-Minerals (PRESERVISION AREDS 2) CAPS Take 1 capsule by mouth 2 (two) times a day.     Probiotic Product (ALIGN) 4 MG CAPS Take 1 capsule by mouth every other day.      No current facility-administered medications for this visit.    Allergies:   Penicillins    ROS:  Please see the history of present illness.   Otherwise, review of systems are positive for ***.   All other systems are reviewed and negative.    PHYSICAL EXAM: VS:  There were no vitals taken for this visit. , BMI There is no height or weight on file to calculate BMI. GENERAL:  Well appearing NECK:  No jugular venous distention, waveform within normal limits, carotid upstroke brisk and symmetric, no bruits, no thyromegaly LUNGS:  Clear to auscultation bilaterally CHEST:  Unremarkable HEART:  PMI not displaced or sustained,S1 and S2 within normal limits, no S3, no S4, no clicks, no rubs, *** murmurs ABD:  Flat, positive bowel sounds normal in frequency in pitch, no bruits, no rebound, no guarding, no midline pulsatile mass, no hepatomegaly, no splenomegaly EXT:  2 plus pulses throughout, no edema, no cyanosis no clubbing    ***GENERAL:  Well appearing NECK:  No jugular venous distention, waveform within normal limits, carotid upstroke brisk and symmetric, no bruits, no thyromegaly LUNGS:  Clear to auscultation bilaterally CHEST:  Unremarkable HEART:  PMI not displaced or sustained,S1 and S2 within normal limits, no S3, no S4, no clicks, no rubs, no murmurs ABD:  Flat, positive bowel sounds normal in frequency in pitch, no bruits, no rebound, no guarding, no midline pulsatile mass, no hepatomegaly, no splenomegaly EXT:  2 plus pulses throughout, no edema, no cyanosis no clubbing   EKG:  EKG is  *** ordered today. The ekg ordered today demonstrates sinus rhythm, rate ***, axis within normal limits, intervals within normal limits, no acute ST-T wave  changes.   Recent Labs: 08/28/2021: ALT 17; BUN 23; Creatinine, Ser 1.23; Hemoglobin 9.4; Platelets 113; Potassium 4.4; Sodium 138    Lipid Panel    Component Value Date/Time   CHOL 187 02/26/2017 1003   TRIG 59.0 02/26/2017 1003   HDL 83.00 02/26/2017 1003   CHOLHDL 2 02/26/2017 1003   VLDL 11.8 02/26/2017 1003   LDLCALC 92 02/26/2017 1003      Wt Readings from Last 3 Encounters:  09/05/21 122 lb 12.8 oz (55.7 kg)  05/09/21 120 lb (54.4 kg)  04/18/21 116 lb (52.6 kg)      Other studies Reviewed:  Additional studies/ records that were reviewed today include: *** . Review of the above records demonstrates:  Please see elsewhere in the note.     ASSESSMENT AND PLAN:  SVT:    ***   Since I last saw her she has had no sustained tach arrhythmias and weaned her self off beta-blockers under my direction.   I am going to give her as needed beta-blocker to take since she has had some very minimal palpitations and documented mild SVT and NSVT before.  Otherwise no work-up is suggested.  Electrolytes were normal last month.  She can return to see me as needed.    Current medicines are reviewed at length with the patient today.  The patient does not have concerns regarding medicines.  The following changes have been made:  ***  Labs/ tests ordered today include:  ***  No orders of the defined types were placed in this encounter.    Disposition:   FU with me in ***   Signed, [name] Breeding, MD  01/27/2022 1:46 PM    [place]

## 2022-01-29 ENCOUNTER — Ambulatory Visit: Payer: Medicare PPO | Attending: Cardiology | Admitting: Cardiology

## 2022-01-29 ENCOUNTER — Ambulatory Visit: Payer: Medicare PPO | Admitting: Cardiology

## 2022-01-29 ENCOUNTER — Encounter: Payer: Self-pay | Admitting: Cardiology

## 2022-01-29 VITALS — BP 108/58 | HR 67 | Ht 61.0 in | Wt 118.6 lb

## 2022-01-29 DIAGNOSIS — I471 Supraventricular tachycardia, unspecified: Secondary | ICD-10-CM

## 2022-01-29 NOTE — Patient Instructions (Signed)
  Follow-Up: At [facility], you and your health needs are our priority.  As part of our continuing mission to provide you with exceptional heart care, we have created designated Provider Care Teams.  These Care Teams include your primary Cardiologist (physician) and Advanced Practice Providers (APPs -  Physician Assistants and Nurse Practitioners) who all work together to provide you with the care you need, when you need it.  We recommend signing up for the patient portal called "MyChart".  Sign up information is provided on this After Visit Summary.  MyChart is used to connect with patients for Virtual Visits (Telemedicine).  Patients are able to view lab/test results, encounter notes, upcoming appointments, etc.  Non-urgent messages can be sent to your provider as well.   To learn more about what you can do with MyChart, go to [email or web address].    Your next appointment:    AS NEEDED 

## 2022-01-31 NOTE — Addendum Note (Signed)
Addended by: Tyron Russell H on: 01/31/2022 12:00 PM   Modules accepted: Orders

## 2022-02-20 ENCOUNTER — Other Ambulatory Visit: Payer: Self-pay | Admitting: Internal Medicine

## 2022-02-28 ENCOUNTER — Inpatient Hospital Stay: Payer: Medicare PPO | Attending: Hematology and Oncology

## 2022-02-28 DIAGNOSIS — C9210 Chronic myeloid leukemia, BCR/ABL-positive, not having achieved remission: Secondary | ICD-10-CM | POA: Insufficient documentation

## 2022-02-28 DIAGNOSIS — T451X5A Adverse effect of antineoplastic and immunosuppressive drugs, initial encounter: Secondary | ICD-10-CM | POA: Diagnosis not present

## 2022-02-28 DIAGNOSIS — N183 Chronic kidney disease, stage 3 unspecified: Secondary | ICD-10-CM | POA: Diagnosis not present

## 2022-02-28 DIAGNOSIS — D61810 Antineoplastic chemotherapy induced pancytopenia: Secondary | ICD-10-CM | POA: Diagnosis not present

## 2022-02-28 LAB — COMPREHENSIVE METABOLIC PANEL
ALT: 18 U/L (ref 0–44)
AST: 37 U/L (ref 15–41)
Albumin: 4.1 g/dL (ref 3.5–5.0)
Alkaline Phosphatase: 47 U/L (ref 38–126)
Anion gap: 3 — ABNORMAL LOW (ref 5–15)
BUN: 27 mg/dL — ABNORMAL HIGH (ref 8–23)
CO2: 32 mmol/L (ref 22–32)
Calcium: 9.5 mg/dL (ref 8.9–10.3)
Chloride: 105 mmol/L (ref 98–111)
Creatinine, Ser: 1.26 mg/dL — ABNORMAL HIGH (ref 0.44–1.00)
GFR, Estimated: 42 mL/min — ABNORMAL LOW (ref >60)
Glucose, Bld: 111 mg/dL — ABNORMAL HIGH (ref 70–99)
Potassium: 4.7 mmol/L (ref 3.5–5.1)
Sodium: 140 mmol/L (ref 135–145)
Total Bilirubin: 0.7 mg/dL (ref 0.3–1.2)
Total Protein: 6.8 g/dL (ref 6.5–8.1)

## 2022-02-28 LAB — CBC WITH DIFFERENTIAL/PLATELET
Abs Immature Granulocytes: 0.01 10*3/uL (ref 0.00–0.07)
Basophils Absolute: 0 10*3/uL (ref 0.0–0.1)
Basophils Relative: 1 %
Eosinophils Absolute: 0.1 10*3/uL (ref 0.0–0.5)
Eosinophils Relative: 2 %
HCT: 29.1 % — ABNORMAL LOW (ref 36.0–46.0)
Hemoglobin: 9.9 g/dL — ABNORMAL LOW (ref 12.0–15.0)
Immature Granulocytes: 0 %
Lymphocytes Relative: 28 %
Lymphs Abs: 1.1 10*3/uL (ref 0.7–4.0)
MCH: 38.5 pg — ABNORMAL HIGH (ref 26.0–34.0)
MCHC: 34 g/dL (ref 30.0–36.0)
MCV: 113.2 fL — ABNORMAL HIGH (ref 80.0–100.0)
Monocytes Absolute: 0.4 10*3/uL (ref 0.1–1.0)
Monocytes Relative: 10 %
Neutro Abs: 2.4 10*3/uL (ref 1.7–7.7)
Neutrophils Relative %: 59 %
Platelets: 137 10*3/uL — ABNORMAL LOW (ref 150–400)
RBC: 2.57 MIL/uL — ABNORMAL LOW (ref 3.87–5.11)
RDW: 13.4 % (ref 11.5–15.5)
WBC: 4 10*3/uL (ref 4.0–10.5)
nRBC: 0 % (ref 0.0–0.2)

## 2022-03-07 LAB — BCR/ABL

## 2022-03-09 ENCOUNTER — Inpatient Hospital Stay: Payer: Medicare PPO | Admitting: Hematology and Oncology

## 2022-03-09 ENCOUNTER — Other Ambulatory Visit: Payer: Self-pay

## 2022-03-09 ENCOUNTER — Encounter: Payer: Self-pay | Admitting: Hematology and Oncology

## 2022-03-09 VITALS — BP 123/55 | HR 76 | Temp 98.0°F | Resp 18 | Ht 61.0 in | Wt 118.4 lb

## 2022-03-09 DIAGNOSIS — N1832 Chronic kidney disease, stage 3b: Secondary | ICD-10-CM | POA: Diagnosis not present

## 2022-03-09 DIAGNOSIS — D61810 Antineoplastic chemotherapy induced pancytopenia: Secondary | ICD-10-CM

## 2022-03-09 DIAGNOSIS — T451X5A Adverse effect of antineoplastic and immunosuppressive drugs, initial encounter: Secondary | ICD-10-CM | POA: Diagnosis not present

## 2022-03-09 DIAGNOSIS — N183 Chronic kidney disease, stage 3 unspecified: Secondary | ICD-10-CM | POA: Diagnosis not present

## 2022-03-09 DIAGNOSIS — C9210 Chronic myeloid leukemia, BCR/ABL-positive, not having achieved remission: Secondary | ICD-10-CM

## 2022-03-09 NOTE — Progress Notes (Signed)
[place] OFFICE PROGRESS NOTE  Patient Care Team: Binnie Rail, MD as PCP - General (Internal Medicine) Minus Breeding, MD as PCP - Cardiology (Cardiology) Gaynelle Arabian, MD as Consulting Physician (Orthopedic Surgery) Heath Lark, MD as Consulting Physician (Hematology and Oncology) Monna Fam, MD as Consulting Physician (Ophthalmology)  ASSESSMENT & PLAN:  Chronic myeloid leukemia [facility]) Her blood count is stable So far, she tolerated treatment well without recent diarrhea, fluid retention or shortness of breath She has mild pancytopenia with treatment but she is not symptomatic BCR/ABL is not detected and she remained in major molecular response We will continue treatment indefinitely I will schedule return appointment in 6 months  CKD (chronic kidney disease), stage III Her serum creatinine fluctuated a little bit She is not symptomatic Observe for now  Pancytopenia due to antineoplastic chemotherapy [facility]) She has stable pancytopenia Serum vitamin B12 and thyroid function were within normal limits She will continue Gleevec as prescribed  No orders of the defined types were placed in this encounter.   All questions were answered. The patient knows to call the clinic with any problems, questions or concerns. The total time spent in the appointment was 20 minutes encounter with patients including review of chart and various tests results, discussions about plan of care and coordination of care plan   Heath Lark, MD 03/09/2022 12:54 PM  INTERVAL HISTORY: Please see below for problem oriented charting. she returns for treatment follow-up on [place] She denies missing doses No recent infection No recent shortness of breath of fluid retention  REVIEW OF SYSTEMS:   Constitutional: Denies fevers, chills or abnormal weight loss Eyes: Denies blurriness of vision Ears, nose, mouth, throat, and face: Denies mucositis or sore throat Respiratory: Denies  cough, dyspnea or wheezes Cardiovascular: Denies palpitation, chest discomfort or lower extremity swelling Gastrointestinal:  Denies nausea, heartburn or change in bowel habits Skin: Denies abnormal skin rashes Lymphatics: Denies new lymphadenopathy or easy bruising Neurological:Denies numbness, tingling or new weaknesses Behavioral/Psych: Mood is stable, no new changes  All other systems were reviewed with the patient and are negative.  I have reviewed the past medical history, past surgical history, social history and family history with the patient and they are unchanged from previous note.  ALLERGIES:  is allergic to penicillins.  MEDICATIONS:  Current Outpatient Medications  Medication Sig Dispense Refill   cholecalciferol (VITAMIN D3) 25 MCG (1000 UT) tablet Take 1,000 Units by mouth daily.     Cyanocobalamin (B-12) 5000 MCG CAPS Take 1 tablet by mouth daily.     estradiol (ESTRACE) 0.1 MG/GM vaginal cream INSERT 1 APPLICATORFUL VAGINALLY 2 TIMES WEEKLY 42.5 g 0   FINACEA 15 % cream Apply 1 application topically daily.  2   folic acid (FOLVITE) 1 MG tablet TAKE 1 TABLET(1 MG) BY MOUTH DAILY. NEED OV FOR ADDITIONAL REFILLS 30 tablet 0   Glucosamine-Chondroit-Vit C-Mn (GLUCOSAMINE CHONDR 1500 COMPLX) CAPS Take 1 capsule by mouth daily.     imatinib (GLEEVEC) 100 MG tablet TAKE 3 TABLETS BY MOUTH EVERY DAY WITH A MEAL AND LARGE GLASS OF WATER 270 tablet 11   Loperamide HCl (IMODIUM PO) Take 0.5-1 tablets by mouth 2 (two) times daily as needed (takes 0.5 tablet every morning and 2nd dose if needed for diarrhea).      Multiple Vitamin (MULTIVITAMIN) tablet Take 1 tablet by mouth daily.     Multiple Vitamins-Minerals (PRESERVISION AREDS 2) CAPS Take 1 capsule by mouth 2 (two) times a day.  Probiotic Product (ALIGN) 4 MG CAPS Take 1 capsule by mouth every other day.      No current facility-administered medications for this visit.    SUMMARY OF ONCOLOGIC HISTORY: Oncology History   Chronic myeloid leukemia ([place])  02/18/2007 Initial Diagnosis   Chronic myeloid leukemia   08/20/2014 Tumor Marker   BCR/ABL is undetectable   03/04/2015 Pathology Results   BCR/ABL is undetectable   08/29/2015 Tumor Marker   BCR/ABL is undetectable   02/29/2016 Pathology Results   BCR/ABL is undetectable   05/01/2016 Pathology Results   BCR/ABL b2a2 is detected at 0.03% and IS ratio 0.069%   05/31/2016 Pathology Results   BCR/ABL b2a2 is detected at 0.12% and IS ratio 0.276%   06/19/2016 Pathology Results   BCR/ABL b2a2 is detected at 0.24% and IS ratio 0.552%   06/19/2016 Miscellaneous   ABL mutation kinase testing is negative   06/27/2016 Miscellaneous   Dose of Gleevec is increased to 600 mg daily   07/25/2016 Pathology Results   BCR/ABL b2a2 is detected at 0.03% and IS ratio 0.069%. She has achieved MMR   08/30/2016 Miscellaneous   Dose of Gleevec is reduced to 300 mg daily and subsequently down to 200 mg daily   10/22/2016 Pathology Results   BCR/ABL b2a2 is not detected. She has achieved MMR   01/03/2017 Pathology Results   BCR/ABL b2a2 is detected IS ratio 0.041%   02/18/2017 Pathology Results   BCR/ABL b2a2 is detected IS ratio 0.0126   05/21/2017 Pathology Results   BCR/ABL b2a2 is not detected. She has achieved MMR   08/20/2017 Pathology Results   BCR/ABL b2a2 is not detected. She has achieved MMR   11/12/2017 Pathology Results   BCR/ABL b2a2 is not detected   04/07/2018 Pathology Results   BCR/ABL b2a2 is detected IS ratio 0.0062. She is in MMR   06/27/2018 Pathology Results   BCR/ABL b2a2 is detected IS ratio 0.0097%. She is in MMR   08/29/2018 Pathology Results   BCR/ABL is undetectable. She is in MMR   12/26/2018 Pathology Results   BCR/ABL b2a2 is detected IS ratio 0.0042%. She is in MMR   05/04/2019 Pathology Results   BCR/ABL b2a2 is detected IS ratio 0.0575%. She is in MMR   08/03/2019 Pathology Results   BCR/ABL b2a2 is detected IS ratio 0.0167%. She is  in MMR   02/01/2020 Pathology Results   BCR/ABL is undetectable   08/01/2020 Pathology Results   BCR/ABL is undetectable   02/28/2021 Pathology Results   BCR/ABL is undetectable   09/04/2021 Pathology Results   BCR/ABL is undetectable   02/28/2022 Pathology Results   BCR/ABL is undetectable      PHYSICAL EXAMINATION: ECOG PERFORMANCE STATUS: 0 - Asymptomatic  Vitals:   03/09/22 1108  BP: (!) 123/55  Pulse: 76  Resp: 18  Temp: 98 F (36.7 C)  SpO2: 100%   Filed Weights   03/09/22 1108  Weight: 118 lb 6.4 oz (53.7 kg)    GENERAL:alert, no distress and comfortable SKIN: skin color, texture, turgor are normal, no rashes or significant lesions EYES: normal, Conjunctiva are pink and non-injected, sclera clear OROPHARYNX:no exudate, no erythema and lips, buccal mucosa, and tongue normal  NECK: supple, thyroid normal size, non-tender, without nodularity LYMPH:  no palpable lymphadenopathy in the cervical, axillary or inguinal LUNGS: clear to auscultation and percussion with normal breathing effort HEART: regular rate & rhythm and no murmurs and no lower extremity edema ABDOMEN:abdomen soft, non-tender and normal bowel sounds Musculoskeletal:no cyanosis  of digits and no clubbing  NEURO: alert & oriented x 3 with fluent speech, no focal motor/sensory deficits  LABORATORY DATA:  I have reviewed the data as listed    Component Value Date/Time   NA 140 02/28/2022 1113   NA 140 02/18/2017 0808   K 4.7 02/28/2022 1113   K 4.4 02/18/2017 0808   CL 105 02/28/2022 1113   CL 105 09/04/2012 1006   CO2 32 02/28/2022 1113   CO2 29 02/18/2017 0808   GLUCOSE 111 (H) 02/28/2022 1113   GLUCOSE 86 02/18/2017 0808   GLUCOSE 96 09/04/2012 1006   BUN 27 (H) 02/28/2022 1113   BUN 20.3 02/18/2017 0808   CREATININE 1.26 (H) 02/28/2022 1113   CREATININE 0.93 05/04/2019 1108   CREATININE 1.1 02/18/2017 0808   CALCIUM 9.5 02/28/2022 1113   CALCIUM 9.4 02/18/2017 0808   PROT 6.8 02/28/2022  1113   PROT 6.6 02/18/2017 0808   ALBUMIN 4.1 02/28/2022 1113   ALBUMIN 4.2 02/18/2017 0808   AST 37 02/28/2022 1113   AST 34 05/04/2019 1108   AST 34 02/18/2017 0808   ALT 18 02/28/2022 1113   ALT 19 05/04/2019 1108   ALT 18 02/18/2017 0808   ALKPHOS 47 02/28/2022 1113   ALKPHOS 53 02/18/2017 0808   BILITOT 0.7 02/28/2022 1113   BILITOT 0.9 05/04/2019 1108   BILITOT 0.76 02/18/2017 0808   GFRNONAA 42 (L) 02/28/2022 1113   GFRNONAA 57 (L) 05/04/2019 1108   GFRAA 45 (L) 02/01/2020 1023   GFRAA >60 05/04/2019 1108    No results found for: "SPEP", "UPEP"  Lab Results  Component Value Date   WBC 4.0 02/28/2022   NEUTROABS 2.4 02/28/2022   HGB 9.9 (L) 02/28/2022   HCT 29.1 (L) 02/28/2022   MCV 113.2 (H) 02/28/2022   PLT 137 (L) 02/28/2022      Chemistry      Component Value Date/Time   NA 140 02/28/2022 1113   NA 140 02/18/2017 0808   K 4.7 02/28/2022 1113   K 4.4 02/18/2017 0808   CL 105 02/28/2022 1113   CL 105 09/04/2012 1006   CO2 32 02/28/2022 1113   CO2 29 02/18/2017 0808   BUN 27 (H) 02/28/2022 1113   BUN 20.3 02/18/2017 0808   CREATININE 1.26 (H) 02/28/2022 1113   CREATININE 0.93 05/04/2019 1108   CREATININE 1.1 02/18/2017 0808      Component Value Date/Time   CALCIUM 9.5 02/28/2022 1113   CALCIUM 9.4 02/18/2017 0808   ALKPHOS 47 02/28/2022 1113   ALKPHOS 53 02/18/2017 0808   AST 37 02/28/2022 1113   AST 34 05/04/2019 1108   AST 34 02/18/2017 0808   ALT 18 02/28/2022 1113   ALT 19 05/04/2019 1108   ALT 18 02/18/2017 0808   BILITOT 0.7 02/28/2022 1113   BILITOT 0.9 05/04/2019 1108   BILITOT 0.76 02/18/2017 5953

## 2022-03-09 NOTE — Assessment & Plan Note (Signed)
She has stable pancytopenia Serum vitamin B12 and thyroid function were within normal limits She will continue Gleevec as prescribed

## 2022-03-09 NOTE — Assessment & Plan Note (Signed)
Her serum creatinine fluctuated a little bit She is not symptomatic Observe for now

## 2022-03-09 NOTE — Assessment & Plan Note (Signed)
Her blood count is stable So far, she tolerated treatment well without recent diarrhea, fluid retention or shortness of breath She has mild pancytopenia with treatment but she is not symptomatic BCR/ABL is not detected and she remained in major molecular response We will continue treatment indefinitely I will schedule return appointment in 6 months

## 2022-04-05 DIAGNOSIS — H353132 Nonexudative age-related macular degeneration, bilateral, intermediate dry stage: Secondary | ICD-10-CM | POA: Diagnosis not present

## 2022-04-05 DIAGNOSIS — H43813 Vitreous degeneration, bilateral: Secondary | ICD-10-CM | POA: Diagnosis not present

## 2022-04-05 DIAGNOSIS — H35033 Hypertensive retinopathy, bilateral: Secondary | ICD-10-CM | POA: Diagnosis not present

## 2022-04-05 DIAGNOSIS — H35363 Drusen (degenerative) of macula, bilateral: Secondary | ICD-10-CM | POA: Diagnosis not present

## 2022-04-05 LAB — HM DIABETES EYE EXAM

## 2022-04-27 ENCOUNTER — Encounter: Payer: Self-pay | Admitting: Internal Medicine

## 2022-04-27 NOTE — Progress Notes (Signed)
Outside notes received. Information abstracted. Notes sent to scan.  

## 2022-05-18 ENCOUNTER — Other Ambulatory Visit: Payer: Self-pay | Admitting: Internal Medicine

## 2022-05-23 ENCOUNTER — Other Ambulatory Visit: Payer: Self-pay

## 2022-05-23 ENCOUNTER — Telehealth: Payer: Self-pay | Admitting: Internal Medicine

## 2022-05-23 MED ORDER — FOLIC ACID 1 MG PO TABS: 1.0000 mg | ORAL_TABLET | Freq: Every day | ORAL | 6 refills | Status: DC

## 2022-05-23 NOTE — Telephone Encounter (Signed)
Caller & Relationship to patient: PT  Call back number: [phone]  Date of last office visit: 05/09/21  Date of next office visit: 05/25/22  Medication(s) to be refilled:  folic acid (FOLVITE) 1 MG tablet   imatinib (GLEEVEC) 100 MG tablet   Preferred Pharmacy:   [facility] [place], [address] AT [address]    PT stated that she would be checking on the refill status for the imatinib (GLEEVEC) as it showed 11 refills on our side.

## 2022-05-23 NOTE — Telephone Encounter (Signed)
Folic acid sent

## 2022-05-24 ENCOUNTER — Encounter: Payer: Self-pay | Admitting: Internal Medicine

## 2022-05-24 DIAGNOSIS — R739 Hyperglycemia, unspecified: Secondary | ICD-10-CM | POA: Insufficient documentation

## 2022-05-24 NOTE — Patient Instructions (Addendum)
Blood work was ordered.   The lab is on the first floor.    Medications changes include :   none     Return in about 1 year (around 05/26/2023) for Physical Exam.   Health Maintenance, Female Adopting a healthy lifestyle and getting preventive care are important in promoting health and wellness. Ask your health care provider about: The right schedule for you to have regular tests and exams. Things you can do on your own to prevent diseases and keep yourself healthy. What should I know about diet, weight, and exercise? Eat a healthy diet  Eat a diet that includes plenty of vegetables, fruits, low-fat dairy products, and lean protein. Do not eat a lot of foods that are high in solid fats, added sugars, or sodium. Maintain a healthy weight Body mass index (BMI) is used to identify weight problems. It estimates body fat based on height and weight. Your health care provider can help determine your BMI and help you achieve or maintain a healthy weight. Get regular exercise Get regular exercise. This is one of the most important things you can do for your health. Most adults should: Exercise for at least 150 minutes each week. The exercise should increase your heart rate and make you sweat (moderate-intensity exercise). Do strengthening exercises at least twice a week. This is in addition to the moderate-intensity exercise. Spend less time sitting. Even light physical activity can be beneficial. Watch cholesterol and blood lipids Have your blood tested for lipids and cholesterol at 86 years of age, then have this test every 5 years. Have your cholesterol levels checked more often if: Your lipid or cholesterol levels are high. You are older than 85 years of age. You are at high risk for heart disease. What should I know about cancer screening? Depending on your health history and family history, you may need to have cancer screening at various ages. This may include screening  for: Breast cancer. Cervical cancer. Colorectal cancer. Skin cancer. Lung cancer. What should I know about heart disease, diabetes, and high blood pressure? Blood pressure and heart disease High blood pressure causes heart disease and increases the risk of stroke. This is more likely to develop in people who have high blood pressure readings or are overweight. Have your blood pressure checked: Every 3-5 years if you are 33-50 years of age. Every year if you are 81 years old or older. Diabetes Have regular diabetes screenings. This checks your fasting blood sugar level. Have the screening done: Once every three years after age 42 if you are at a normal weight and have a low risk for diabetes. More often and at a younger age if you are overweight or have a high risk for diabetes. What should I know about preventing infection? Hepatitis B If you have a higher risk for hepatitis B, you should be screened for this virus. Talk with your health care provider to find out if you are at risk for hepatitis B infection. Hepatitis C Testing is recommended for: Everyone born from [birth year] through 1963. Anyone with known risk factors for hepatitis C. Sexually transmitted infections (STIs) Get screened for STIs, including gonorrhea and chlamydia, if: You are sexually active and are younger than 86 years of age. You are older than 86 years of age and your health care provider tells you that you are at risk for this type of infection. Your sexual activity has changed since you were last screened, and you are at  increased risk for chlamydia or gonorrhea. Ask your health care provider if you are at risk. Ask your health care provider about whether you are at high risk for HIV. Your health care provider may recommend a prescription medicine to help prevent HIV infection. If you choose to take medicine to prevent HIV, you should first get tested for HIV. You should then be tested every 3 months for as long as you  are taking the medicine. Pregnancy If you are about to stop having your period (premenopausal) and you may become pregnant, seek counseling before you get pregnant. Take 400 to 800 micrograms (mcg) of folic acid every day if you become pregnant. Ask for birth control (contraception) if you want to prevent pregnancy. Osteoporosis and menopause Osteoporosis is a disease in which the bones lose minerals and strength with aging. This can result in bone fractures. If you are 62 years old or older, or if you are at risk for osteoporosis and fractures, ask your health care provider if you should: Be screened for bone loss. Take a calcium or vitamin D supplement to lower your risk of fractures. Be given hormone replacement therapy (HRT) to treat symptoms of menopause. Follow these instructions at home: Alcohol use Do not drink alcohol if: Your health care provider tells you not to drink. You are pregnant, may be pregnant, or are planning to become pregnant. If you drink alcohol: Limit how much you have to: 0-1 drink a day. Know how much alcohol is in your drink. In the [place] one drink equals one 12 oz bottle of beer (355 mL), one 5 oz glass of wine (148 mL), or one 1 oz glass of hard liquor (44 mL). Lifestyle Do not use any products that contain nicotine or tobacco. These products include cigarettes, chewing tobacco, and vaping devices, such as e-cigarettes. If you need help quitting, ask your health care provider. Do not use street drugs. Do not share needles. Ask your health care provider for help if you need support or information about quitting drugs. General instructions Schedule regular health, dental, and eye exams. Stay current with your vaccines. Tell your health care provider if: You often feel depressed. You have ever been abused or do not feel safe at home. Summary Adopting a healthy lifestyle and getting preventive care are important in promoting health and wellness. Follow your  health care provider's instructions about healthy diet, exercising, and getting tested or screened for diseases. Follow your health care provider's instructions on monitoring your cholesterol and blood pressure. This information is not intended to replace advice given to you by your health care provider. Make sure you discuss any questions you have with your health care provider. Document Revised: 09/05/2020 Document Reviewed: 09/05/2020 Elsevier Patient Education  [employer].

## 2022-05-24 NOTE — Progress Notes (Signed)
Subjective:    Patient ID: [name] [name], female    DOB: 1935-03-26, 88 y.o.   MRN: [id number]      HPI [name] [name] is here for a Physical exam.    Has cough that is chronic.  Sometimes mild sinus pressure on right side.  Has PND.  She is concerned she may have a chronic sinus infection.  It is not going away and she feels like she might need to be treated for that.  Otherwise doing well and has no concerns.  Medications and allergies reviewed with patient and updated if appropriate.  Current Outpatient Medications on File Prior to Visit  Medication Sig Dispense Refill   cholecalciferol (VITAMIN D3) 25 MCG (1000 UT) tablet Take 1,000 Units by mouth daily.     Cyanocobalamin (B-12) 5000 MCG CAPS Take 1 tablet by mouth daily.     estradiol (ESTRACE) 0.1 MG/GM vaginal cream INSERT 1 APPLICATORFUL VAGINALLY 2 TIMES WEEKLY 42.5 g 0   FINACEA 15 % cream Apply 1 application topically daily.  2   folic acid (FOLVITE) 1 MG tablet Take 1 tablet (1 mg total) by mouth daily. TAKE 1 TABLET(1 MG) BY MOUTH DAILY 30 tablet 6   Glucosamine-Chondroit-Vit C-Mn (GLUCOSAMINE CHONDR 1500 COMPLX) CAPS Take 1 capsule by mouth daily.     imatinib (GLEEVEC) 100 MG tablet TAKE 3 TABLETS BY MOUTH EVERY DAY WITH A MEAL AND LARGE GLASS OF WATER 270 tablet 11   Loperamide HCl (IMODIUM PO) Take 0.5-1 tablets by mouth 2 (two) times daily as needed (takes 0.5 tablet every morning and 2nd dose if needed for diarrhea).      Multiple Vitamin (MULTIVITAMIN) tablet Take 1 tablet by mouth daily.     Multiple Vitamins-Minerals (PRESERVISION AREDS 2) CAPS Take 1 capsule by mouth 2 (two) times a day.     Probiotic Product (ALIGN) 4 MG CAPS Take 1 capsule by mouth every other day.      No current facility-administered medications on file prior to visit.    Review of Systems  Constitutional:  Negative for chills and fever.  HENT:  Positive for postnasal drip and sinus pressure. Negative for congestion.   Eyes:  Negative  for visual disturbance.  Respiratory:  Positive for cough. Negative for shortness of breath and wheezing.   Cardiovascular:  Negative for chest pain, palpitations and leg swelling.  Gastrointestinal:  Positive for diarrhea (side effect from gleevac). Negative for abdominal pain, blood in stool, constipation and nausea.       No gerd  Genitourinary:  Negative for dysuria.  Musculoskeletal:  Positive for arthralgias (mild, stiffness). Negative for back pain.  Skin:  Negative for rash.  Neurological:  Negative for dizziness, light-headedness, numbness and headaches.  Psychiatric/Behavioral:  Negative for dysphoric mood. The patient is not nervous/anxious.        Objective:   Vitals:   05/25/22 1343  BP: 124/76  Pulse: 65  Temp: 98.2 F (36.8 C)  SpO2: 100%   Filed Weights   05/25/22 1343  Weight: 116 lb (52.6 kg)   Body mass index is 21.92 kg/m.  BP Readings from Last 3 Encounters:  05/25/22 124/76  03/09/22 (!) 123/55  01/29/22 (!) 108/58    Wt Readings from Last 3 Encounters:  05/25/22 116 lb (52.6 kg)  03/09/22 118 lb 6.4 oz (53.7 kg)  01/29/22 118 lb 9.6 oz (53.8 kg)       Physical Exam Constitutional: She appears well-developed and well-nourished. No distress.  HENT:  Head: Normocephalic and atraumatic.  Right Ear: External ear normal. Normal ear canal and TM Left Ear: External ear normal.  Normal ear canal and TM Mouth/Throat: Oropharynx is clear and moist.  Eyes: Conjunctivae normal.  Neck: Neck supple. No tracheal deviation present. No thyromegaly present.  No carotid bruit  Cardiovascular: Normal rate, regular rhythm and normal heart sounds.   No murmur heard.  No edema. Pulmonary/Chest: Effort normal and breath sounds normal. No respiratory distress. She has no wheezes. She has no rales.  Breast: deferred   Abdominal: Soft. She exhibits no distension. There is no tenderness.  Lymphadenopathy: She has no cervical adenopathy.  Skin: Skin is warm and  dry. She is not diaphoretic.  Psychiatric: She has a normal mood and affect. Her behavior is normal.     Lab Results  Component Value Date   WBC 4.0 02/28/2022   HGB 9.9 (L) 02/28/2022   HCT 29.1 (L) 02/28/2022   PLT 137 (L) 02/28/2022   GLUCOSE 111 (H) 02/28/2022   CHOL 187 02/26/2017   TRIG 59.0 02/26/2017   HDL 83.00 02/26/2017   LDLCALC 92 02/26/2017   ALT 18 02/28/2022   AST 37 02/28/2022   NA 140 02/28/2022   K 4.7 02/28/2022   CL 105 02/28/2022   CREATININE 1.26 (H) 02/28/2022   BUN 27 (H) 02/28/2022   CO2 32 02/28/2022   TSH 1.506 08/29/2018   INR 1.0 02/26/2019   HGBA1C 5.4 07/27/2013         Assessment & Plan:   Physical exam: Screening blood work  ordered Exercise  regular Weight  good Substance abuse  none   Reviewed recommended immunizations.   Health Maintenance  Topic Date Due   Zoster Vaccines- Shingrix (1 of 2) Never done   DTaP/Tdap/Td (2 - Tdap) 04/30/2016   Medicare Annual Wellness (AWV)  06/07/2022   COVID-19 Vaccine (7 - 2023-24 season) 06/10/2022 (Originally 03/04/2022)   DEXA SCAN  05/26/2023 (Originally 02/19/2020)   Pneumonia Vaccine 43+ Years old  Completed   INFLUENZA VACCINE  Completed   HPV VACCINES  Aged Out          See Problem List for Assessment and Plan of chronic medical problems.

## 2022-05-25 ENCOUNTER — Ambulatory Visit: Payer: Medicare PPO | Admitting: Internal Medicine

## 2022-05-25 ENCOUNTER — Encounter: Payer: Self-pay | Admitting: Internal Medicine

## 2022-05-25 VITALS — BP 124/76 | HR 65 | Temp 98.2°F | Ht 61.0 in | Wt 116.0 lb

## 2022-05-25 DIAGNOSIS — R739 Hyperglycemia, unspecified: Secondary | ICD-10-CM

## 2022-05-25 DIAGNOSIS — E538 Deficiency of other specified B group vitamins: Secondary | ICD-10-CM | POA: Diagnosis not present

## 2022-05-25 DIAGNOSIS — K5792 Diverticulitis of intestine, part unspecified, without perforation or abscess without bleeding: Secondary | ICD-10-CM | POA: Insufficient documentation

## 2022-05-25 DIAGNOSIS — C9210 Chronic myeloid leukemia, BCR/ABL-positive, not having achieved remission: Secondary | ICD-10-CM

## 2022-05-25 DIAGNOSIS — E559 Vitamin D deficiency, unspecified: Secondary | ICD-10-CM | POA: Diagnosis not present

## 2022-05-25 DIAGNOSIS — N1832 Chronic kidney disease, stage 3b: Secondary | ICD-10-CM | POA: Diagnosis not present

## 2022-05-25 DIAGNOSIS — Z13220 Encounter for screening for lipoid disorders: Secondary | ICD-10-CM

## 2022-05-25 DIAGNOSIS — Z0000 Encounter for general adult medical examination without abnormal findings: Secondary | ICD-10-CM

## 2022-05-25 DIAGNOSIS — J321 Chronic frontal sinusitis: Secondary | ICD-10-CM

## 2022-05-25 DIAGNOSIS — M85859 Other specified disorders of bone density and structure, unspecified thigh: Secondary | ICD-10-CM | POA: Diagnosis not present

## 2022-05-25 DIAGNOSIS — N952 Postmenopausal atrophic vaginitis: Secondary | ICD-10-CM | POA: Diagnosis not present

## 2022-05-25 LAB — LIPID PANEL
Cholesterol: 191 mg/dL (ref 0–200)
HDL: 82.9 mg/dL (ref >39.00)
LDL Cholesterol: 92 mg/dL (ref 0–99)
NonHDL: 108.5
Total CHOL/HDL Ratio: 2
Triglycerides: 83 mg/dL (ref 0.0–149.0)
VLDL: 16.6 mg/dL (ref 0.0–40.0)

## 2022-05-25 LAB — VITAMIN D 25 HYDROXY (VIT D DEFICIENCY, FRACTURES): VITD: 65.46 ng/mL (ref 30.00–100.00)

## 2022-05-25 LAB — VITAMIN B12: Vitamin B-12: 1194 pg/mL — ABNORMAL HIGH (ref 211–911)

## 2022-05-25 LAB — HEMOGLOBIN A1C: Hgb A1c MFr Bld: 5.4 % (ref 4.6–6.5)

## 2022-05-25 MED ORDER — DOXYCYCLINE HYCLATE 100 MG PO TABS: 100.0000 mg | ORAL_TABLET | Freq: Two times a day (BID) | ORAL | 0 refills | Status: AC

## 2022-05-25 NOTE — Assessment & Plan Note (Signed)
Chronic Check a1c Low sugar / carb diet Stressed regular exercise   

## 2022-05-25 NOTE — Assessment & Plan Note (Signed)
Chronic Following with oncology

## 2022-05-25 NOTE — Assessment & Plan Note (Signed)
States prolonged sinus symptoms-possible chronic sinus infection Will go ahead and treat her with an antibiotic Doxycycline 100 mg twice daily x 7 days Let me know if there is no improvement

## 2022-05-25 NOTE — Assessment & Plan Note (Signed)
Chronic Continue Estrace vaginal cream twice weekly

## 2022-05-25 NOTE — Assessment & Plan Note (Addendum)
Chronic DEXA ?  Up-to-date-she states it is followed by her oncologist Continue regular exercise Continue vitamin D, multivitamin Check vitamin D level

## 2022-05-25 NOTE — Assessment & Plan Note (Signed)
Chronic Taking vitamin d daily Check vitamin d level  

## 2022-05-25 NOTE — Assessment & Plan Note (Signed)
Chronic Taking vitamin B12 Check B12 level

## 2022-05-25 NOTE — Assessment & Plan Note (Addendum)
Chronic Has been stable

## 2022-06-15 ENCOUNTER — Ambulatory Visit (INDEPENDENT_AMBULATORY_CARE_PROVIDER_SITE_OTHER): Payer: Medicare PPO

## 2022-06-15 VITALS — Ht 61.0 in | Wt 118.0 lb

## 2022-06-15 DIAGNOSIS — Z0000 Encounter for general adult medical examination without abnormal findings: Secondary | ICD-10-CM

## 2022-06-15 NOTE — Progress Notes (Signed)
I connected with  [name] [name] on 06/15/22 by a audio enabled telemedicine application and verified that I am speaking with the correct person using two identifiers.  Patient Location: Home  Provider Location: Office/Clinic  I discussed the limitations of evaluation and management by telemedicine. The patient expressed understanding and agreed to proceed.  Subjective:   [name] [name] is a 88 y.o. female who presents for Medicare Annual (Subsequent) preventive examination.  Review of Systems     Cardiac Risk Factors include: advanced age (>72mn, >>23women)     Objective:    Today's Vitals   06/15/22 1326  Weight: 118 lb (53.5 kg)  Height: 5' 1"$  (1.549 m)   Body mass index is 22.3 kg/m.     06/15/2022    1:30 PM 06/07/2021    3:42 PM 04/27/2020    1:46 PM 03/31/2019    1:30 PM 03/02/2019    5:00 PM 03/02/2019    4:59 PM 02/26/2019    8:32 AM  Advanced Directives  Does Patient Have a Medical Advance Directive? Yes Yes Yes Yes  Yes Yes  Type of AParamed[place]of [place] will Living will;Healthcare Power of [place] Living will  Living will  Does patient want to make changes to medical advance directive?  No - Patient declined No - Patient declined  No - Patient declined No - Patient declined No - Patient declined  Copy of [place] Chart? No - copy requested No - copy requested  No - copy requested       Current Medications (verified) Outpatient Encounter Medications as of 06/15/2022  Medication Sig   cholecalciferol (VITAMIN D3) 25 MCG (1000 UT) tablet Take 1,000 Units by mouth daily.   Cyanocobalamin (B-12) 5000 MCG CAPS Take 1 tablet by mouth daily.   estradiol (ESTRACE) 0.1 MG/GM vaginal cream INSERT 1 APPLICATORFUL VAGINALLY 2 TIMES WEEKLY   FINACEA 15 % cream Apply 1 application topically daily.   folic acid (FOLVITE) 1 MG tablet Take 1 tablet (1 mg total) by mouth  daily. TAKE 1 TABLET(1 MG) BY MOUTH DAILY   Glucosamine-Chondroit-Vit C-Mn (GLUCOSAMINE CHONDR 1500 COMPLX) CAPS Take 1 capsule by mouth daily.   imatinib (GLEEVEC) 100 MG tablet TAKE 3 TABLETS BY MOUTH EVERY DAY WITH A MEAL AND LARGE GLASS OF WATER   Loperamide HCl (IMODIUM PO) Take 0.5-1 tablets by mouth 2 (two) times daily as needed (takes 0.5 tablet every morning and 2nd dose if needed for diarrhea).    Multiple Vitamin (MULTIVITAMIN) tablet Take 1 tablet by mouth daily.   Multiple Vitamins-Minerals (PRESERVISION AREDS 2) CAPS Take 1 capsule by mouth 2 (two) times a day.   Probiotic Product (ALIGN) 4 MG CAPS Take 1 capsule by mouth every other day.    No facility-administered encounter medications on file as of 06/15/2022.    Allergies (verified) Penicillins   History: Past Medical History:  Diagnosis Date   Anemia requiring transfusions 2010 & 2015   Arthritis    DJD   Breast cancer ([place] 1993   S/P lumpectomy , radiation, & Tamoxifen   CML (chronic myeloid leukemia) ([place]    Dr GAlvy Bimler   SVT (supraventricular tachycardia)    dx june 2020 , magd by Dr HPercival Spanish; reports occasional heart flutter   Past Surgical History:  Procedure Laterality Date   ABDOMINAL HYSTERECTOMY  ~1989   no BSO, for Dysplasia    APPENDECTOMY  ~1989  BILATERAL SALPINGOOPHORECTOMY  2010   with bowel resection   BREAST SURGERY     Right lumpectomy, s/p radiation 1993 and oral  Tamoxifen x 5 years [phone])    CATARACT EXTRACTION  06/08/2014   left eye;Dr Herbert Deaner   CATARACT EXTRACTION, BILATERAL Bilateral 2015   COLONOSCOPY  03/2003, last 2010   diverticulosis, Dr.Kaplan   EXCISION MORTON'S NEUROMA Left mid 80's   foot   gastrocslide     left leg; Dr Beola Cord   ileostomy reversal  2011   3 mos after above   KNEE SURGERY  mid 80's   Arthroscopy, right knee    LEG TENDON SURGERY Left 2013   "cut on leg and fixed something"   OPEN SURGICAL REPAIR OF GLUTEAL TENDON Left 10/24/2016   Procedure:  Left hip bursectomy; gluteal tendon repair;  Surgeon: Gaynelle Arabian, MD;  Location: [facility];  Service: Orthopedics;  Laterality: Left;   sigmoid colon resection  2010    Dr Arlyce Dice, [place] for fistula & diverticulitis   TENDON REPAIR Left 2019   left shoulder tendon repair - surgical center    TOTAL HIP ARTHROPLASTY Left 03/02/2019   Procedure: TOTAL HIP ARTHROPLASTY ANTERIOR APPROACH;  Surgeon: Gaynelle Arabian, MD;  Location: [facility];  Service: Orthopedics;  Laterality: Left;  179mn   TOTAL KNEE ARTHROPLASTY Right 06/11/2013   Procedure: RIGHT TOTAL KNEE ARTHROPLASTY;  Surgeon: RSydnee Cabal MD;  Location: [facility];  Service: Orthopedics;  Laterality: Right;   TRIGGER FINGER RELEASE Left 2013   thumb   Family History  Problem Relation Age of Onset   Colon cancer Mother [phone]  Uterine cancer Sister    Diabetes Cousin    Cancer Father        Lip cancer   Aneurysm Father        AAA;S/P surgery   Breast cancer Paternal Aunt    Heart attack Paternal Uncle 636  Stroke Neg Hx    Esophageal cancer Neg Hx    Rectal cancer Neg Hx    Stomach cancer Neg Hx    Social History   Socioeconomic History   Marital status: Married    Spouse name: Not on file   Number of children: 2   Years of education: Not on file   Highest education level: Not on file  Occupational History   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 Tobacco Use   Smoking status: Former    Years: 5.00    Types: Cigarettes    Quit date: 04/30/1962    Years since quitting: 60.1   Smokeless tobacco: Never   Tobacco comments:    smoked 1956-1964, up to 1/2 ppd  Vaping Use   Vaping Use: Never used  Substance and Sexual Activity   Alcohol use: Yes    Alcohol/week: 7.0 - 14.0 standard drinks of alcohol    Types: 7 - 14 Glasses of wine per week    Comment: 10-29 about 10 glasses of wine weekly    Drug use: No   Sexual activity: Yes  Other Topics Concern   Not on file  Social History Narrative   Married,    Social Determinants  of Health   Financial Resource Strain: Low Risk  (06/15/2022)   Overall Financial Resource Strain (CARDIA)    Difficulty of Paying Living Expenses: Not hard at all  Food Insecurity: No Food Insecurity (06/15/2022)   Hunger Vital Sign    Worried About Running Out of Food in the Last Year: Never true  Ran Out of Food in the Last Year: Never true  Transportation Needs: No Transportation Needs (06/15/2022)   PRAPARE - Hydrologist (Medical): No    Lack of Transportation (Non-Medical): No  Physical Activity: Sufficiently Active (06/15/2022)   Exercise Vital Sign    Days of Exercise per Week: 5 days    Minutes of Exercise per Session: 30 min  Stress: No Stress Concern Present (06/15/2022)   [place]    Feeling of Stress : Not at all  Social Connections: [place] (06/07/2021)   Social Connection and Isolation Panel [NHANES]    Frequency of Communication with Friends and Family: More than three times a week    Frequency of Social Gatherings with Friends and Family: Never    Attends Religious Services: More than 4 times per year    Active Member of [employer] or Organizations: No    Attends Music therapist: More than 4 times per year    Marital Status: Married    Tobacco Counseling Counseling given: Not Answered Tobacco comments: smoked 1956-1964, up to 1/2 ppd   Clinical Intake:  Pre-visit preparation completed: Yes  Pain : No/denies pain     Nutritional Status: BMI of 19-24  Normal Nutritional Risks: Nausea/ vomitting/ diarrhea (diarrhea at times) Diabetes: No  How often do you need to have someone help you when you read instructions, pamphlets, or other written materials from your doctor or pharmacy?: 1 - Never  Diabetic? no  Interpreter Needed?: No  Information entered by :: NAllen LPN   Activities of Daily Living    06/15/2022    1:31 PM  In your  present state of health, do you have any difficulty performing the following activities:  Hearing? 0  Vision? 0  Comment macular degeneration  Difficulty concentrating or making decisions? 1  Walking or climbing stairs? 0  Dressing or bathing? 0  Doing errands, shopping? 0  Preparing Food and eating ? N  Using the Toilet? N  In the past six months, have you accidently leaked urine? Y  Do you have problems with loss of bowel control? Y  Comment due to diarrhea  Managing your Medications? N  Managing your Finances? N  Housekeeping or managing your Housekeeping? N    Patient Care Team: Binnie Rail, MD as PCP - General (Internal Medicine) Minus Breeding, MD as PCP - Cardiology (Cardiology) Gaynelle Arabian, MD as Consulting Physician (Orthopedic Surgery) Heath Lark, MD as Consulting Physician (Hematology and Oncology) Monna Fam, MD as Consulting Physician (Ophthalmology)  Indicate any recent Medical Services you may have received from other than [facility] providers in the past year (date may be approximate).     Assessment:   This is a routine wellness examination for [name] [name].  Hearing/Vision screen Vision Screening - Comments:: Regular eye exams, [facility]  Dietary issues and exercise activities discussed: Current Exercise Habits: Home exercise routine, Type of exercise: walking, Time (Minutes): 30, Frequency (Times/Week): 5, Weekly Exercise (Minutes/Week): 150   Goals Addressed             This Visit's Progress    Patient Stated       06/15/2022, no goals       Depression Screen    06/15/2022    1:31 PM 05/25/2022    1:43 PM 06/07/2021    3:47 PM 04/27/2020    1:42 PM 03/31/2019    1:53 PM 02/26/2017  9:18 AM 02/17/2016    1:46 PM  PHQ 2/9 Scores  PHQ - 2 Score 0 0 0 0 0 0 0  PHQ- 9 Score  3         Fall Risk    06/15/2022    1:31 PM 05/25/2022    1:42 PM 06/07/2021    4:15 PM 05/09/2021    3:16 PM 04/27/2020    1:46 PM  [place] in the  past year? 0 0 0 0 0  Number falls in past yr: 0 0 0 0 0  Injury with Fall? 0 0 0 0 0  Risk for fall due to : Medication side effect No Fall Risks No Fall Risks No Fall Risks No Fall Risks  Follow up Falls prevention discussed;Education provided;Falls evaluation completed Falls evaluation completed Falls evaluation completed Falls evaluation completed Falls evaluation completed;Education provided    FALL RISK PREVENTION PERTAINING TO THE HOME:  Any stairs in or around the home? Yes  If so, are there any without handrails? No  Home free of loose throw rugs in walkways, pet beds, electrical cords, etc? Yes  Adequate lighting in your home to reduce risk of falls? Yes   ASSISTIVE DEVICES UTILIZED TO PREVENT FALLS:  Life alert? No  Use of a cane, walker or w/c? No  Grab bars in the bathroom? No  Shower chair or bench in shower? No  Elevated toilet seat or a handicapped toilet? No   TIMED UP AND GO:  Was the test performed? No .      Cognitive Function:        06/15/2022    1:33 PM  6CIT Screen  What Year? 0 points  What month? 0 points  What time? 0 points  Count back from 20 0 points  Months in reverse 0 points  Repeat phrase 4 points  Total Score 4 points    Immunizations Immunization History  Administered Date(s) Administered   Fluad Quad(high Dose 65+) 02/06/2019, 02/01/2020, 02/11/2022   Influenza Split 02/17/2015   Influenza Whole 02/29/2012   Influenza, High Dose Seasonal PF 02/17/2016   Influenza,inj,Quad PF,6+ Mos 04/15/2018   Influenza,inj,quad, With Preservative 01/28/2017   Influenza-Unspecified 03/14/2013, 01/19/2014, 01/17/2017, 02/16/2021   PFIZER(Purple Top)SARS-COV-2 Vaccination 06/07/2019, 07/02/2019, 02/08/2020, 09/20/2020   Pfizer Covid-19 Vaccine Bivalent Booster 58yr & up 03/30/2021, 01/07/2022   Pneumococcal Conjugate-13 03/01/2015   Pneumococcal Polysaccharide-23 03/30/2002, 09/29/2002, 03/11/2012   Td 04/30/2006   Zoster, Live 06/29/1986     TDAP status: Due, Education has been provided regarding the importance of this vaccine. Advised may receive this vaccine at local pharmacy or Health Dept. Aware to provide a copy of the vaccination record if obtained from local pharmacy or Health Dept. Verbalized acceptance and understanding.  Flu Vaccine status: Up to date  Pneumococcal vaccine status: Up to date  Covid-19 vaccine status: Completed vaccines  Qualifies for Shingles Vaccine? Yes   Zostavax completed Yes   Shingrix Completed?: No.    Education has been provided regarding the importance of this vaccine. Patient has been advised to call insurance company to determine out of pocket expense if they have not yet received this vaccine. Advised may also receive vaccine at local pharmacy or Health Dept. Verbalized acceptance and understanding.  Screening Tests Health Maintenance  Topic Date Due   Zoster Vaccines- Shingrix (1 of 2) Never done   DTaP/Tdap/Td (2 - Tdap) 04/30/2016   COVID-19 Vaccine (7 - 2023-24 season) 03/04/2022   Medicare Annual Wellness (  AWV)  06/07/2022   DEXA SCAN  05/26/2023 (Originally 02/19/2020)   Pneumonia Vaccine 44+ Years old  Completed   INFLUENZA VACCINE  Completed   HPV VACCINES  Aged Out    Health Maintenance  Health Maintenance Due  Topic Date Due   Zoster Vaccines- Shingrix (1 of 2) Never done   DTaP/Tdap/Td (2 - Tdap) 04/30/2016   COVID-19 Vaccine (7 - 2023-24 season) 03/04/2022   Medicare Annual Wellness (AWV)  06/07/2022    Colorectal cancer screening: No longer required.   Mammogram status: Completed 06/26/2021. Repeat every year  Bone Density status: Completed 02/18/2018.  Lung Cancer Screening: (Low Dose CT Chest recommended if Age 64-80 years, 30 pack-year currently smoking OR have quit w/in 15years.) does not qualify.   Lung Cancer Screening Referral: no  Additional Screening:  Hepatitis C Screening: does not qualify;   Vision Screening: Recommended annual  ophthalmology exams for early detection of glaucoma and other disorders of the eye. Is the patient up to date with their annual eye exam?  Yes  Who is the provider or what is the name of the office in which the patient attends annual eye exams? [facility] If pt is not established with a provider, would they like to be referred to a provider to establish care? No .   Dental Screening: Recommended annual dental exams for proper oral hygiene  Community Resource Referral / Chronic Care Management: CRR required this visit?  No   CCM required this visit?  No      Plan:     I have personally reviewed and noted the following in the patient's chart:   Medical and social history Use of alcohol, tobacco or illicit drugs  Current medications and supplements including opioid prescriptions. Patient is not currently taking opioid prescriptions. Functional ability and status Nutritional status Physical activity Advanced directives List of other physicians Hospitalizations, surgeries, and ER visits in previous 12 months Vitals Screenings to include cognitive, depression, and falls Referrals and appointments  In addition, I have reviewed and discussed with patient certain preventive protocols, quality metrics, and best practice recommendations. A written personalized care plan for preventive services as well as general preventive health recommendations were provided to patient.     Kellie Simmering, LPN   [id number]   Nurse Notes: none  Due to this being a virtual visit, the after visit summary with patients personalized plan was offered to patient via mail or my-chart.  to pick up at office at next visit

## 2022-06-15 NOTE — Patient Instructions (Signed)
[name] [name] , Thank you for taking time to come for your Medicare Wellness Visit. I appreciate your ongoing commitment to your health goals. Please review the following plan we discussed and let me know if I can assist you in the future.   These are the goals we discussed:  Goals       Patient Stated      Continue to gradually increase the amount that I walk routinely until I reach the distance I was walking prior to having hip surgery.       Patient Stated (pt-stated)      Patient declined health goal at this time.      Patient Stated      06/15/2022, no goals        This is a list of the screening recommended for you and due dates:  Health Maintenance  Topic Date Due   Zoster (Shingles) Vaccine (1 of 2) Never done   DTaP/Tdap/Td vaccine (2 - Tdap) 04/30/2016   COVID-19 Vaccine (7 - 2023-24 season) 03/04/2022   DEXA scan (bone density measurement)  05/26/2023*   Medicare Annual Wellness Visit  06/16/2023   Pneumonia Vaccine  Completed   Flu Shot  Completed   HPV Vaccine  Aged Out  *Topic was postponed. The date shown is not the original due date.    Advanced directives: Please bring a copy of your POA (Power of Attorney) and/or Living Will to your next appointment.   Conditions/risks identified: none  Next appointment: Follow up in one year for your annual wellness visit    Preventive Care 65 Years and Older, Female Preventive care refers to lifestyle choices and visits with your health care provider that can promote health and wellness. What does preventive care include? A yearly physical exam. This is also called an annual well check. Dental exams once or twice a year. Routine eye exams. Ask your health care provider how often you should have your eyes checked. Personal lifestyle choices, including: Daily care of your teeth and gums. Regular physical activity. Eating a healthy diet. Avoiding tobacco and drug use. Limiting alcohol use. Practicing safe sex. Taking  low-dose aspirin every day. Taking vitamin and mineral supplements as recommended by your health care provider. What happens during an annual well check? The services and screenings done by your health care provider during your annual well check will depend on your age, overall health, lifestyle risk factors, and family history of disease. Counseling  Your health care provider may ask you questions about your: Alcohol use. Tobacco use. Drug use. Emotional well-being. Home and relationship well-being. Sexual activity. Eating habits. History of falls. Memory and ability to understand (cognition). Work and work Statistician. Reproductive health. Screening  You may have the following tests or measurements: Height, weight, and BMI. Blood pressure. Lipid and cholesterol levels. These may be checked every 5 years, or more frequently if you are over 39 years old. Skin check. Lung cancer screening. You may have this screening every year starting at age 86 if you have a 30-pack-year history of smoking and currently smoke or have quit within the past 15 years. Fecal occult blood test (FOBT) of the stool. You may have this test every year starting at age 79. Flexible sigmoidoscopy or colonoscopy. You may have a sigmoidoscopy every 5 years or a colonoscopy every 10 years starting at age 68. Hepatitis C blood test. Hepatitis B blood test. Sexually transmitted disease (STD) testing. Diabetes screening. This is done by checking your blood sugar (  glucose) after you have not eaten for a while (fasting). You may have this done every 1-3 years. Bone density scan. This is done to screen for osteoporosis. You may have this done starting at age 56. Mammogram. This may be done every 1-2 years. Talk to your health care provider about how often you should have regular mammograms. Talk with your health care provider about your test results, treatment options, and if necessary, the need for more tests. Vaccines   Your health care provider may recommend certain vaccines, such as: Influenza vaccine. This is recommended every year. Tetanus, diphtheria, and acellular pertussis (Tdap, Td) vaccine. You may need a Td booster every 10 years. Zoster vaccine. You may need this after age 9. Pneumococcal 13-valent conjugate (PCV13) vaccine. One dose is recommended after age 19. Pneumococcal polysaccharide (PPSV23) vaccine. One dose is recommended after age 35. Talk to your health care provider about which screenings and vaccines you need and how often you need them. This information is not intended to replace advice given to you by your health care provider. Make sure you discuss any questions you have with your health care provider. Document Released: 05/13/2015 Document Revised: 01/04/2016 Document Reviewed: 02/15/2015 Elsevier Interactive Patient Education  2017 [place] Prevention in the Home Falls can cause injuries. They can happen to people of all ages. There are many things you can do to make your home safe and to help prevent falls. What can I do on the outside of my home? Regularly fix the edges of walkways and driveways and fix any cracks. Remove anything that might make you trip as you walk through a door, such as a raised step or threshold. Trim any bushes or trees on the path to your home. Use bright outdoor lighting. Clear any walking paths of anything that might make someone trip, such as rocks or tools. Regularly check to see if handrails are loose or broken. Make sure that both sides of any steps have handrails. Any raised decks and porches should have guardrails on the edges. Have any leaves, snow, or ice cleared regularly. Use sand or salt on walking paths during winter. Clean up any spills in your garage right away. This includes oil or grease spills. What can I do in the bathroom? Use night lights. Install grab bars by the toilet and in the tub and shower. Do not use towel  bars as grab bars. Use non-skid mats or decals in the tub or shower. If you need to sit down in the shower, use a plastic, non-slip stool. Keep the floor dry. Clean up any water that spills on the floor as soon as it happens. Remove soap buildup in the tub or shower regularly. Attach bath mats securely with double-sided non-slip rug tape. Do not have throw rugs and other things on the floor that can make you trip. What can I do in the bedroom? Use night lights. Make sure that you have a light by your bed that is easy to reach. Do not use any sheets or blankets that are too big for your bed. They should not hang down onto the floor. Have a firm chair that has side arms. You can use this for support while you get dressed. Do not have throw rugs and other things on the floor that can make you trip. What can I do in the kitchen? Clean up any spills right away. Avoid walking on wet floors. Keep items that you use a lot in easy-to-reach places.  If you need to reach something above you, use a strong step stool that has a grab bar. Keep electrical cords out of the way. Do not use floor polish or wax that makes floors slippery. If you must use wax, use non-skid floor wax. Do not have throw rugs and other things on the floor that can make you trip. What can I do with my stairs? Do not leave any items on the stairs. Make sure that there are handrails on both sides of the stairs and use them. Fix handrails that are broken or loose. Make sure that handrails are as long as the stairways. Check any carpeting to make sure that it is firmly attached to the stairs. Fix any carpet that is loose or worn. Avoid having throw rugs at the top or bottom of the stairs. If you do have throw rugs, attach them to the floor with carpet tape. Make sure that you have a light switch at the top of the stairs and the bottom of the stairs. If you do not have them, ask someone to add them for you. What else can I do to help  prevent falls? Wear shoes that: Do not have high heels. Have rubber bottoms. Are comfortable and fit you well. Are closed at the toe. Do not wear sandals. If you use a stepladder: Make sure that it is fully opened. Do not climb a closed stepladder. Make sure that both sides of the stepladder are locked into place. Ask someone to hold it for you, if possible. Clearly mark and make sure that you can see: Any grab bars or handrails. First and last steps. Where the edge of each step is. Use tools that help you move around (mobility aids) if they are needed. These include: Canes. Walkers. Scooters. Crutches. Turn on the lights when you go into a dark area. Replace any light bulbs as soon as they burn out. Set up your furniture so you have a clear path. Avoid moving your furniture around. If any of your floors are uneven, fix them. If there are any pets around you, be aware of where they are. Review your medicines with your doctor. Some medicines can make you feel dizzy. This can increase your chance of falling. Ask your doctor what other things that you can do to help prevent falls. This information is not intended to replace advice given to you by your health care provider. Make sure you discuss any questions you have with your health care provider. Document Released: 02/10/2009 Document Revised: 09/22/2015 Document Reviewed: 05/21/2014 Elsevier Interactive Patient Education  2017 [employer].

## 2022-07-04 DIAGNOSIS — Z1231 Encounter for screening mammogram for malignant neoplasm of breast: Secondary | ICD-10-CM | POA: Diagnosis not present

## 2022-07-04 LAB — HM MAMMOGRAPHY

## 2022-07-05 ENCOUNTER — Encounter: Payer: Self-pay | Admitting: Internal Medicine

## 2022-07-05 NOTE — Progress Notes (Signed)
Outside notes received. Information abstracted. Notes sent to scan.  

## 2022-07-09 ENCOUNTER — Ambulatory Visit: Payer: Medicare PPO | Admitting: Internal Medicine

## 2022-07-09 ENCOUNTER — Encounter: Payer: Self-pay | Admitting: Internal Medicine

## 2022-07-09 VITALS — BP 112/68 | HR 70 | Temp 98.7°F | Ht 61.0 in | Wt 119.6 lb

## 2022-07-09 DIAGNOSIS — U071 COVID-19: Secondary | ICD-10-CM | POA: Insufficient documentation

## 2022-07-09 LAB — POC COVID19 BINAXNOW: SARS Coronavirus 2 Ag: POSITIVE — AB

## 2022-07-09 LAB — POC INFLUENZA A&B (BINAX/QUICKVUE)
Influenza A, POC: NEGATIVE
Influenza B, POC: NEGATIVE

## 2022-07-09 LAB — POCT RESPIRATORY SYNCYTIAL VIRUS: RSV Rapid Ag: NEGATIVE

## 2022-07-09 MED ORDER — NIRMATRELVIR/RITONAVIR (PAXLOVID) TABLET (RENAL DOSING): 2.0000 | ORAL_TABLET | Freq: Two times a day (BID) | ORAL | 0 refills | Status: AC

## 2022-07-09 NOTE — Assessment & Plan Note (Signed)
Acute Symptoms of viral illness-COVID-positive Her husband has COVID Will start Paxlovid-renal dosing 2 tabs twice daily x 5 days Discussed with Dr. Gifford Shave hold Gleevac while on Paxlovid Okay to take over-the-counter cold medications as needed, rest, fluids Call with any questions or concerns

## 2022-07-09 NOTE — Progress Notes (Signed)
Subjective:    Patient ID: [name] [name], female    DOB: 09-02-1934, 88 y.o.   MRN: [id number]      HPI [name] [name] is here for  Chief Complaint  Patient presents with   Cough    Cough, congestion, sore throat; Symptoms started on Sunday. Husband tested + for COVID on Friday.     Symptoms started two days ago.  Her husband has COVID and he tested +2 days earlier.  She states fatigue, nasal congestion, runny nose, cough.  At night sometimes she will have some phlegm.  She denies fevers, shortness of breath, wheeze, chest pain, sinus pain, sore throat, headaches and lightheadedness.    Medications and allergies reviewed with patient and updated if appropriate.  Current Outpatient Medications on File Prior to Visit  Medication Sig Dispense Refill   cholecalciferol (VITAMIN D3) 25 MCG (1000 UT) tablet Take 1,000 Units by mouth daily.     Cyanocobalamin (B-12) 5000 MCG CAPS Take 1 tablet by mouth daily.     estradiol (ESTRACE) 0.1 MG/GM vaginal cream INSERT 1 APPLICATORFUL VAGINALLY 2 TIMES WEEKLY 42.5 g 0   FINACEA 15 % cream Apply 1 application topically daily.  2   folic acid (FOLVITE) 1 MG tablet Take 1 tablet (1 mg total) by mouth daily. TAKE 1 TABLET(1 MG) BY MOUTH DAILY 30 tablet 6   Glucosamine-Chondroit-Vit C-Mn (GLUCOSAMINE CHONDR 1500 COMPLX) CAPS Take 1 capsule by mouth daily.     imatinib (GLEEVEC) 100 MG tablet TAKE 3 TABLETS BY MOUTH EVERY DAY WITH A MEAL AND LARGE GLASS OF WATER 270 tablet 11   Loperamide HCl (IMODIUM PO) Take 0.5-1 tablets by mouth 2 (two) times daily as needed (takes 0.5 tablet every morning and 2nd dose if needed for diarrhea).      Multiple Vitamin (MULTIVITAMIN) tablet Take 1 tablet by mouth daily.     Multiple Vitamins-Minerals (PRESERVISION AREDS 2) CAPS Take 1 capsule by mouth 2 (two) times a day.     Probiotic Product (ALIGN) 4 MG CAPS Take 1 capsule by mouth every other day.      No current facility-administered medications on file prior  to visit.    Review of Systems  Constitutional:  Positive for fatigue. Negative for chills and fever.  HENT:  Positive for congestion and rhinorrhea. Negative for sinus pressure, sinus pain and sore throat.        No change in smell or taste   Respiratory:  Positive for cough. Negative for shortness of breath and wheezing.   Cardiovascular:  Negative for chest pain.  Gastrointestinal:  Negative for diarrhea and nausea.  Musculoskeletal:  Positive for myalgias.  Neurological:  Negative for dizziness, light-headedness and headaches.       Objective:   Vitals:   07/09/22 1550  BP: 112/68  Pulse: 70  Temp: 98.7 F (37.1 C)  SpO2: 98%   BP Readings from Last 3 Encounters:  07/09/22 112/68  05/25/22 124/76  03/09/22 (!) 123/55   Wt Readings from Last 3 Encounters:  07/09/22 119 lb 9.6 oz (54.3 kg)  06/15/22 118 lb (53.5 kg)  05/25/22 116 lb (52.6 kg)   Body mass index is 22.6 kg/m.    Physical Exam Constitutional:      General: She is not in acute distress.    Appearance: Normal appearance. She is not ill-appearing.  HENT:     Head: Normocephalic and atraumatic.     Right Ear: Tympanic membrane, ear canal and external ear normal.  Left Ear: Tympanic membrane, ear canal and external ear normal.     Mouth/Throat:     Mouth: Mucous membranes are moist.     Pharynx: No oropharyngeal exudate or posterior oropharyngeal erythema.  Eyes:     Conjunctiva/sclera: Conjunctivae normal.  Cardiovascular:     Rate and Rhythm: Normal rate and regular rhythm.  Pulmonary:     Effort: Pulmonary effort is normal. No respiratory distress.     Breath sounds: Normal breath sounds. No wheezing or rales.  Musculoskeletal:     Cervical back: Neck supple. No tenderness.  Lymphadenopathy:     Cervical: No cervical adenopathy.  Skin:    General: Skin is warm and dry.  Neurological:     Mental Status: She is alert.            Assessment & Plan:    See Problem List for  Assessment and Plan of chronic medical problems.

## 2022-07-09 NOTE — Patient Instructions (Addendum)
Hold Gleevac while taking paxlovid    Medications changes include :   paxlovid 2 pills twice a day for 5 days     Return if symptoms worsen or fail to improve.    Nirmatrelvir; Ritonavir Tablets What is this medication? NIRMATRELVIR; RITONAVIR (NIR ma TREL vir; ri TOE na veer) treats mild to moderate COVID-19. It may help people who are at high risk of developing severe illness. It works by limiting the spread of the virus in your body. This medicine may be used for other purposes; ask your health care provider or pharmacist if you have questions. COMMON BRAND NAME(S): PAXLOVID What should I tell my care team before I take this medication? They need to know if you have any of these conditions: Any allergies Any serious illness Kidney disease Liver disease An unusual or allergic reaction to nirmatrelvir, ritonavir, other medications, foods, dyes, or preservatives Pregnant or trying to get pregnant Breast-feeding How should I use this medication? This product contains 2 different medications that are packaged together. For the standard dose, take 2 pink tablets of nirmatrelvir with 1 white tablet of ritonavir (3 tablets total) by mouth with water twice daily. Talk to your care team if you have kidney disease. You may need a different dose. Swallow the tablets whole. You can take it with or without food. If it upsets your stomach, take it with food. Take all of this medication unless your care team tells you to stop it early. Keep taking it even if you think you are better. Talk to your care team about the use of this medication in children. While it may be prescribed for children as young as 12 years for selected conditions, precautions do apply. Overdosage: If you think you have taken too much of this medicine contact a poison control center or emergency room at once. NOTE: This medicine is only for you. Do not share this medicine with others. What if I miss a dose? If you miss  a dose, take it as soon as you can unless it is more than 8 hours late. If it is more than 8 hours late, skip the missed dose. Take the next dose at the normal time. Do not take extra or 2 doses at the same time to make up for the missed dose. What may interact with this medication? Do not take this medication with any of the following medications: Alfuzosin Certain medications for anxiety or sleep like midazolam, triazolam Certain medications for cancer like apalutamide, enzalutamide Certain medications for cholesterol like lovastatin, simvastatin Certain medications for irregular heart beat like amiodarone, dronedarone, flecainide, propafenone, quinidine Certain medications for pain like meperidine, piroxicam Certain medications for psychotic disorders like clozapine, lurasidone, pimozide Certain medications for seizures like carbamazepine, phenobarbital, phenytoin Colchicine Eletriptan Eplerenone Ergot alkaloids like dihydroergotamine, ergonovine, ergotamine, methylergonovine Finerenone Flibanserin Ivabradine Lomitapide Naloxegol Ranolazine Rifampin Sildenafil Silodosin St. John's Wort Tolvaptan Ubrogepant Voclosporin This medication may also interact with the following medications: Bedaquiline Birth control pills Bosentan Certain antibiotics like erythromycin or clarithromycin Certain medications for blood pressure like amlodipine, diltiazem, felodipine, nicardipine, nifedipine Certain medications for cancer like abemaciclib, ceritinib, dasatinib, encorafenib, ibrutinib, ivosidenib, neratinib, nilotinib, venetoclax, vinblastine, vincristine Certain medications for cholesterol like atorvastatin, rosuvastatin Certain medications for depression like bupropion, trazodone Certain medications for fungal infections like isavuconazonium, itraconazole, ketoconazole, voriconazole Certain medications for hepatitis C like elbasvir; grazoprevir, dasabuvir; ombitasvir; paritaprevir;  ritonavir, glecaprevir; pibrentasvir, sofosbuvir; velpatasvir; voxilaprevir Certain medications for HIV or AIDS Certain medications for irregular heartbeat  like lidocaine Certain medications that treat or prevent blood clots like rivaroxaban, warfarin Digoxin Fentanyl Medications that lower your chance of fighting infection like cyclosporine, sirolimus, tacrolimus Methadone Quetiapine Rifabutin Salmeterol Steroid medications like betamethasone, budesonide, ciclesonide, dexamethasone, fluticasone, methylprednisolone, mometasone, triamcinolone This list may not describe all possible interactions. Give your health care provider a list of all the medicines, herbs, non-prescription drugs, or dietary supplements you use. Also tell them if you smoke, drink alcohol, or use illegal drugs. Some items may interact with your medicine. What should I watch for while using this medication? Your condition will be monitored carefully while you are receiving this medication. Visit your care team for regular checkups. Tell your care team if your symptoms do not start to get better or if they get worse. If you have untreated HIV infection, this medication may lead to some HIV medications not working as well in the future. Estrogen and progestin hormones may not work as well while you are taking this medication. Your care team can help you find the contraceptive option that works for you. What side effects may I notice from receiving this medication? Side effects that you should report to your care team as soon as possible: Allergic reactions--skin rash, itching, hives, swelling of the face, lips, tongue, or throat Liver injury--right upper belly pain, loss of appetite, nausea, light-colored stool, dark yellow or brown urine, yellowing skin or eyes, unusual weakness or fatigue Redness, blistering, peeling, or loosening of the skin, including inside the mouth Side effects that usually do not require medical  attention (report these to your care team if they continue or are bothersome): Change in taste Diarrhea General discomfort and fatigue Increase in blood pressure Muscle pain Nausea Stomach pain This list may not describe all possible side effects. Call your doctor for medical advice about side effects. You may report side effects to FDA at 1-800-FDA-1088. Where should I keep my medication? Keep out of the reach of children and pets. Store at room temperature between 20 and 25 degrees C (68 and 77 degrees F). Get rid of any unused medication after the expiration date. To get rid of medications that are no longer needed or have expired: Take the medication to a medication take-back program. Check with your pharmacy or law enforcement to find a location. If you cannot return the medication, check the label or package insert to see if the medication should be thrown out in the garbage or flushed down the toilet. If you are not sure, ask your care team. If it is safe to put it in the trash, take the medication out of the container. Mix the medication with cat litter, dirt, coffee grounds, or other unwanted substance. Seal the mixture in a bag or container. Put it in the trash. NOTE: This sheet is a summary. It may not cover all possible information. If you have questions about this medicine, talk to your doctor, pharmacist, or health care provider.  2023 Elsevier/Gold Standard (2020-04-25 00:00:00)

## 2022-08-31 ENCOUNTER — Other Ambulatory Visit: Payer: Self-pay

## 2022-08-31 ENCOUNTER — Inpatient Hospital Stay: Payer: Medicare PPO | Attending: Hematology and Oncology

## 2022-08-31 DIAGNOSIS — T451X5A Adverse effect of antineoplastic and immunosuppressive drugs, initial encounter: Secondary | ICD-10-CM | POA: Insufficient documentation

## 2022-08-31 DIAGNOSIS — C9210 Chronic myeloid leukemia, BCR/ABL-positive, not having achieved remission: Secondary | ICD-10-CM | POA: Insufficient documentation

## 2022-08-31 DIAGNOSIS — D61810 Antineoplastic chemotherapy induced pancytopenia: Secondary | ICD-10-CM | POA: Diagnosis not present

## 2022-08-31 LAB — COMPREHENSIVE METABOLIC PANEL
ALT: 19 U/L (ref 0–44)
AST: 39 U/L (ref 15–41)
Albumin: 4.2 g/dL (ref 3.5–5.0)
Alkaline Phosphatase: 43 U/L (ref 38–126)
Anion gap: 4 — ABNORMAL LOW (ref 5–15)
BUN: 28 mg/dL — ABNORMAL HIGH (ref 8–23)
CO2: 31 mmol/L (ref 22–32)
Calcium: 9.4 mg/dL (ref 8.9–10.3)
Chloride: 104 mmol/L (ref 98–111)
Creatinine, Ser: 1.37 mg/dL — ABNORMAL HIGH (ref 0.44–1.00)
GFR, Estimated: 38 mL/min — ABNORMAL LOW (ref >60)
Glucose, Bld: 96 mg/dL (ref 70–99)
Potassium: 4.7 mmol/L (ref 3.5–5.1)
Sodium: 139 mmol/L (ref 135–145)
Total Bilirubin: 0.7 mg/dL (ref 0.3–1.2)
Total Protein: 6.7 g/dL (ref 6.5–8.1)

## 2022-08-31 LAB — CBC WITH DIFFERENTIAL/PLATELET
Abs Immature Granulocytes: 0.01 10*3/uL (ref 0.00–0.07)
Basophils Absolute: 0 10*3/uL (ref 0.0–0.1)
Basophils Relative: 1 %
Eosinophils Absolute: 0.1 10*3/uL (ref 0.0–0.5)
Eosinophils Relative: 2 %
HCT: 28.7 % — ABNORMAL LOW (ref 36.0–46.0)
Hemoglobin: 9.8 g/dL — ABNORMAL LOW (ref 12.0–15.0)
Immature Granulocytes: 0 %
Lymphocytes Relative: 27 %
Lymphs Abs: 1 10*3/uL (ref 0.7–4.0)
MCH: 38.7 pg — ABNORMAL HIGH (ref 26.0–34.0)
MCHC: 34.1 g/dL (ref 30.0–36.0)
MCV: 113.4 fL — ABNORMAL HIGH (ref 80.0–100.0)
Monocytes Absolute: 0.5 10*3/uL (ref 0.1–1.0)
Monocytes Relative: 12 %
Neutro Abs: 2.3 10*3/uL (ref 1.7–7.7)
Neutrophils Relative %: 58 %
Platelets: 131 10*3/uL — ABNORMAL LOW (ref 150–400)
RBC: 2.53 MIL/uL — ABNORMAL LOW (ref 3.87–5.11)
RDW: 13.5 % (ref 11.5–15.5)
WBC: 3.9 10*3/uL — ABNORMAL LOW (ref 4.0–10.5)
nRBC: 0 % (ref 0.0–0.2)

## 2022-09-03 DIAGNOSIS — D2272 Melanocytic nevi of left lower limb, including hip: Secondary | ICD-10-CM | POA: Diagnosis not present

## 2022-09-03 DIAGNOSIS — L821 Other seborrheic keratosis: Secondary | ICD-10-CM | POA: Diagnosis not present

## 2022-09-03 DIAGNOSIS — L218 Other seborrheic dermatitis: Secondary | ICD-10-CM | POA: Diagnosis not present

## 2022-09-03 DIAGNOSIS — D2262 Melanocytic nevi of left upper limb, including shoulder: Secondary | ICD-10-CM | POA: Diagnosis not present

## 2022-09-03 DIAGNOSIS — L570 Actinic keratosis: Secondary | ICD-10-CM | POA: Diagnosis not present

## 2022-09-05 LAB — BCR/ABL

## 2022-09-06 DIAGNOSIS — M25511 Pain in right shoulder: Secondary | ICD-10-CM | POA: Diagnosis not present

## 2022-09-06 DIAGNOSIS — M25512 Pain in left shoulder: Secondary | ICD-10-CM | POA: Diagnosis not present

## 2022-09-11 ENCOUNTER — Encounter: Payer: Self-pay | Admitting: Hematology and Oncology

## 2022-09-11 ENCOUNTER — Inpatient Hospital Stay (HOSPITAL_BASED_OUTPATIENT_CLINIC_OR_DEPARTMENT_OTHER): Payer: Medicare PPO | Admitting: Hematology and Oncology

## 2022-09-11 ENCOUNTER — Other Ambulatory Visit: Payer: Self-pay

## 2022-09-11 VITALS — BP 124/63 | HR 82 | Temp 98.3°F | Resp 18 | Ht 61.0 in | Wt 121.4 lb

## 2022-09-11 DIAGNOSIS — D61810 Antineoplastic chemotherapy induced pancytopenia: Secondary | ICD-10-CM | POA: Diagnosis not present

## 2022-09-11 DIAGNOSIS — T451X5A Adverse effect of antineoplastic and immunosuppressive drugs, initial encounter: Secondary | ICD-10-CM | POA: Diagnosis not present

## 2022-09-11 DIAGNOSIS — C9210 Chronic myeloid leukemia, BCR/ABL-positive, not having achieved remission: Secondary | ICD-10-CM | POA: Diagnosis not present

## 2022-09-11 NOTE — Progress Notes (Signed)
[facility] OFFICE PROGRESS NOTE  Patient Care Team: Pincus Sanes, MD as PCP - General (Internal Medicine) Rollene Rotunda, MD as PCP - Cardiology (Cardiology) Ollen Gross, MD as Consulting Physician (Orthopedic Surgery) Artis Delay, MD as Consulting Physician (Hematology and Oncology) Mateo Flow, MD as Consulting Physician (Ophthalmology)  ASSESSMENT & PLAN:  Chronic myeloid leukemia (HCC) Her blood count is stable So far, she tolerated treatment well without recent diarrhea, fluid retention or shortness of breath She has mild pancytopenia with treatment but she is not symptomatic BCR/ABL is not detected and she remained in major molecular response We will continue treatment indefinitely I will schedule return appointment in 6 months  Pancytopenia due to antineoplastic chemotherapy [facility]) She has stable pancytopenia She will continue Gleevec as prescribed  No orders of the defined types were placed in this encounter.   All questions were answered. The patient knows to call the clinic with any problems, questions or concerns. The total time spent in the appointment was 20 minutes encounter with patients including review of chart and various tests results, discussions about plan of care and coordination of care plan   Artis Delay, MD 09/11/2022 12:16 PM  INTERVAL HISTORY: Please see below for problem oriented charting. she returns for treatment follow-up with her husband, on maintenance treatment with Gleevec Recently, when she refill her prescription, she misplaced the drugs but ultimately found them.  She denies missing doses No recent infection Denies recent fluid retention or shortness of breath  REVIEW OF SYSTEMS:   Constitutional: Denies fevers, chills or abnormal weight loss Eyes: Denies blurriness of vision Ears, nose, mouth, throat, and face: Denies mucositis or sore throat Respiratory: Denies cough, dyspnea or wheezes Cardiovascular: Denies  palpitation, chest discomfort or lower extremity swelling Gastrointestinal:  Denies nausea, heartburn or change in bowel habits Skin: Denies abnormal skin rashes Lymphatics: Denies new lymphadenopathy or easy bruising Neurological:Denies numbness, tingling or new weaknesses Behavioral/Psych: Mood is stable, no new changes  All other systems were reviewed with the patient and are negative.  I have reviewed the past medical history, past surgical history, social history and family history with the patient and they are unchanged from previous note.  ALLERGIES:  is allergic to penicillins.  MEDICATIONS:  Current Outpatient Medications  Medication Sig Dispense Refill   cholecalciferol (VITAMIN D3) 25 MCG (1000 UT) tablet Take 1,000 Units by mouth daily.     Cyanocobalamin (B-12) 5000 MCG CAPS Take 1 tablet by mouth daily.     estradiol (ESTRACE) 0.1 MG/GM vaginal cream INSERT 1 APPLICATORFUL VAGINALLY 2 TIMES WEEKLY 42.5 g 0   FINACEA 15 % cream Apply 1 application topically daily.  2   folic acid (FOLVITE) 1 MG tablet Take 1 tablet (1 mg total) by mouth daily. TAKE 1 TABLET(1 MG) BY MOUTH DAILY 30 tablet 6   Glucosamine-Chondroit-Vit C-Mn (GLUCOSAMINE CHONDR 1500 COMPLX) CAPS Take 1 capsule by mouth daily.     imatinib (GLEEVEC) 100 MG tablet TAKE 3 TABLETS BY MOUTH EVERY DAY WITH A MEAL AND LARGE GLASS OF WATER 270 tablet 11   Loperamide HCl (IMODIUM PO) Take 0.5-1 tablets by mouth 2 (two) times daily as needed (takes 0.5 tablet every morning and 2nd dose if needed for diarrhea).      Multiple Vitamin (MULTIVITAMIN) tablet Take 1 tablet by mouth daily.     Multiple Vitamins-Minerals (PRESERVISION AREDS 2) CAPS Take 1 capsule by mouth 2 (two) times a day.     Probiotic Product (ALIGN) 4 MG  CAPS Take 1 capsule by mouth every other day.      No current facility-administered medications for this visit.    SUMMARY OF ONCOLOGIC HISTORY: Oncology History  Chronic myeloid leukemia (HCC)   02/18/2007 Initial Diagnosis   Chronic myeloid leukemia   08/20/2014 Tumor Marker   BCR/ABL is undetectable   03/04/2015 Pathology Results   BCR/ABL is undetectable   08/29/2015 Tumor Marker   BCR/ABL is undetectable   02/29/2016 Pathology Results   BCR/ABL is undetectable   05/01/2016 Pathology Results   BCR/ABL b2a2 is detected at 0.03% and IS ratio 0.069%   05/31/2016 Pathology Results   BCR/ABL b2a2 is detected at 0.12% and IS ratio 0.276%   06/19/2016 Pathology Results   BCR/ABL b2a2 is detected at 0.24% and IS ratio 0.552%   06/19/2016 Miscellaneous   ABL mutation kinase testing is negative   06/27/2016 Miscellaneous   Dose of Gleevec is increased to 600 mg daily   07/25/2016 Pathology Results   BCR/ABL b2a2 is detected at 0.03% and IS ratio 0.069%. She has achieved MMR   08/30/2016 Miscellaneous   Dose of Gleevec is reduced to 300 mg daily and subsequently down to 200 mg daily   10/22/2016 Pathology Results   BCR/ABL b2a2 is not detected. She has achieved MMR   01/03/2017 Pathology Results   BCR/ABL b2a2 is detected IS ratio 0.041%   02/18/2017 Pathology Results   BCR/ABL b2a2 is detected IS ratio 0.0126   05/21/2017 Pathology Results   BCR/ABL b2a2 is not detected. She has achieved MMR   08/20/2017 Pathology Results   BCR/ABL b2a2 is not detected. She has achieved MMR   11/12/2017 Pathology Results   BCR/ABL b2a2 is not detected   04/07/2018 Pathology Results   BCR/ABL b2a2 is detected IS ratio 0.0062. She is in MMR   06/27/2018 Pathology Results   BCR/ABL b2a2 is detected IS ratio 0.0097%. She is in MMR   08/29/2018 Pathology Results   BCR/ABL is undetectable. She is in MMR   12/26/2018 Pathology Results   BCR/ABL b2a2 is detected IS ratio 0.0042%. She is in MMR   05/04/2019 Pathology Results   BCR/ABL b2a2 is detected IS ratio 0.0575%. She is in MMR   08/03/2019 Pathology Results   BCR/ABL b2a2 is detected IS ratio 0.0167%. She is in MMR   02/01/2020 Pathology  Results   BCR/ABL is undetectable   08/01/2020 Pathology Results   BCR/ABL is undetectable   02/28/2021 Pathology Results   BCR/ABL is undetectable   09/04/2021 Pathology Results   BCR/ABL is undetectable   02/28/2022 Pathology Results   BCR/ABL is undetectable    08/31/2022 Pathology Results   BCR/ABL is undetectable      PHYSICAL EXAMINATION: ECOG PERFORMANCE STATUS: 0 - Asymptomatic  Vitals:   09/11/22 1032  BP: 124/63  Pulse: 82  Resp: 18  Temp: 98.3 F (36.8 C)  SpO2: 100%   Filed Weights   09/11/22 1032  Weight: 121 lb 6.4 oz (55.1 kg)    GENERAL:alert, no distress and comfortable SKIN: skin color, texture, turgor are normal, no rashes or significant lesions EYES: normal, Conjunctiva are pink and non-injected, sclera clear OROPHARYNX:no exudate, no erythema and lips, buccal mucosa, and tongue normal  NECK: supple, thyroid normal size, non-tender, without nodularity LYMPH:  no palpable lymphadenopathy in the cervical, axillary or inguinal LUNGS: clear to auscultation and percussion with normal breathing effort HEART: regular rate & rhythm and no murmurs and no lower extremity edema ABDOMEN:abdomen soft, non-tender and  normal bowel sounds Musculoskeletal:no cyanosis of digits and no clubbing  NEURO: alert & oriented x 3 with fluent speech, no focal motor/sensory deficits  LABORATORY DATA:  I have reviewed the data as listed    Component Value Date/Time   NA 139 08/31/2022 1001   NA 140 02/18/2017 0808   K 4.7 08/31/2022 1001   K 4.4 02/18/2017 0808   CL 104 08/31/2022 1001   CL 105 09/04/2012 1006   CO2 31 08/31/2022 1001   CO2 29 02/18/2017 0808   GLUCOSE 96 08/31/2022 1001   GLUCOSE 86 02/18/2017 0808   GLUCOSE 96 09/04/2012 1006   BUN 28 (H) 08/31/2022 1001   BUN 20.3 02/18/2017 0808   CREATININE 1.37 (H) 08/31/2022 1001   CREATININE 0.93 05/04/2019 1108   CREATININE 1.1 02/18/2017 0808   CALCIUM 9.4 08/31/2022 1001   CALCIUM 9.4 02/18/2017 0808    PROT 6.7 08/31/2022 1001   PROT 6.6 02/18/2017 0808   ALBUMIN 4.2 08/31/2022 1001   ALBUMIN 4.2 02/18/2017 0808   AST 39 08/31/2022 1001   AST 34 05/04/2019 1108   AST 34 02/18/2017 0808   ALT 19 08/31/2022 1001   ALT 19 05/04/2019 1108   ALT 18 02/18/2017 0808   ALKPHOS 43 08/31/2022 1001   ALKPHOS 53 02/18/2017 0808   BILITOT 0.7 08/31/2022 1001   BILITOT 0.9 05/04/2019 1108   BILITOT 0.76 02/18/2017 0808   GFRNONAA 38 (L) 08/31/2022 1001   GFRNONAA 57 (L) 05/04/2019 1108   GFRAA 45 (L) 02/01/2020 1023   GFRAA >60 05/04/2019 1108    No results found for: "SPEP", "UPEP"  Lab Results  Component Value Date   WBC 3.9 (L) 08/31/2022   NEUTROABS 2.3 08/31/2022   HGB 9.8 (L) 08/31/2022   HCT 28.7 (L) 08/31/2022   MCV 113.4 (H) 08/31/2022   PLT 131 (L) 08/31/2022      Chemistry      Component Value Date/Time   NA 139 08/31/2022 1001   NA 140 02/18/2017 0808   K 4.7 08/31/2022 1001   K 4.4 02/18/2017 0808   CL 104 08/31/2022 1001   CL 105 09/04/2012 1006   CO2 31 08/31/2022 1001   CO2 29 02/18/2017 0808   BUN 28 (H) 08/31/2022 1001   BUN 20.3 02/18/2017 0808   CREATININE 1.37 (H) 08/31/2022 1001   CREATININE 0.93 05/04/2019 1108   CREATININE 1.1 02/18/2017 0808      Component Value Date/Time   CALCIUM 9.4 08/31/2022 1001   CALCIUM 9.4 02/18/2017 0808   ALKPHOS 43 08/31/2022 1001   ALKPHOS 53 02/18/2017 0808   AST 39 08/31/2022 1001   AST 34 05/04/2019 1108   AST 34 02/18/2017 0808   ALT 19 08/31/2022 1001   ALT 19 05/04/2019 1108   ALT 18 02/18/2017 0808   BILITOT 0.7 08/31/2022 1001   BILITOT 0.9 05/04/2019 1108   BILITOT 0.76 02/18/2017 8657

## 2022-09-11 NOTE — Assessment & Plan Note (Signed)
Her blood count is stable So far, she tolerated treatment well without recent diarrhea, fluid retention or shortness of breath She has mild pancytopenia with treatment but she is not symptomatic BCR/ABL is not detected and she remained in major molecular response We will continue treatment indefinitely I will schedule return appointment in 6 months 

## 2022-09-11 NOTE — Assessment & Plan Note (Signed)
She has stable pancytopenia She will continue Gleevec as prescribed

## 2022-09-13 ENCOUNTER — Other Ambulatory Visit: Payer: Self-pay | Admitting: Hematology and Oncology

## 2022-09-13 DIAGNOSIS — C9210 Chronic myeloid leukemia, BCR/ABL-positive, not having achieved remission: Secondary | ICD-10-CM

## 2022-11-21 DIAGNOSIS — Z973 Presence of spectacles and contact lenses: Secondary | ICD-10-CM | POA: Diagnosis not present

## 2022-11-21 LAB — HM DIABETES EYE EXAM

## 2022-12-12 ENCOUNTER — Ambulatory Visit: Payer: Medicare PPO | Admitting: Emergency Medicine

## 2022-12-13 ENCOUNTER — Ambulatory Visit: Payer: Medicare PPO | Admitting: Internal Medicine

## 2022-12-13 ENCOUNTER — Encounter: Payer: Self-pay | Admitting: Internal Medicine

## 2022-12-13 VITALS — BP 110/60 | HR 52 | Temp 98.6°F | Ht 61.0 in | Wt 120.0 lb

## 2022-12-13 DIAGNOSIS — R509 Fever, unspecified: Secondary | ICD-10-CM | POA: Diagnosis not present

## 2022-12-13 DIAGNOSIS — C9210 Chronic myeloid leukemia, BCR/ABL-positive, not having achieved remission: Secondary | ICD-10-CM

## 2022-12-13 DIAGNOSIS — D61818 Other pancytopenia: Secondary | ICD-10-CM

## 2022-12-13 DIAGNOSIS — D696 Thrombocytopenia, unspecified: Secondary | ICD-10-CM | POA: Diagnosis not present

## 2022-12-13 LAB — CBC WITH DIFFERENTIAL/PLATELET
Basophils Absolute: 0 10*3/uL (ref 0.0–0.1)
Basophils Relative: 0.4 % (ref 0.0–3.0)
Eosinophils Absolute: 0.1 10*3/uL (ref 0.0–0.7)
Eosinophils Relative: 1.1 % (ref 0.0–5.0)
HCT: 26.5 % — ABNORMAL LOW (ref 36.0–46.0)
Hemoglobin: 9 g/dL — ABNORMAL LOW (ref 12.0–15.0)
Lymphocytes Relative: 17.7 % (ref 12.0–46.0)
Lymphs Abs: 1 10*3/uL (ref 0.7–4.0)
MCHC: 34.1 g/dL (ref 30.0–36.0)
MCV: 115.2 fl — ABNORMAL HIGH (ref 78.0–100.0)
Monocytes Absolute: 0.9 10*3/uL (ref 0.1–1.0)
Monocytes Relative: 16.1 % — ABNORMAL HIGH (ref 3.0–12.0)
Neutro Abs: 3.5 10*3/uL (ref 1.4–7.7)
Neutrophils Relative %: 64.7 % (ref 43.0–77.0)
Platelets: 149 10*3/uL — ABNORMAL LOW (ref 150.0–400.0)
RBC: 2.3 Mil/uL — ABNORMAL LOW (ref 3.87–5.11)
RDW: 13.2 % (ref 11.5–15.5)
WBC: 5.4 10*3/uL (ref 4.0–10.5)

## 2022-12-13 LAB — COMPREHENSIVE METABOLIC PANEL
ALT: 26 U/L (ref 0–35)
AST: 56 U/L — ABNORMAL HIGH (ref 0–37)
Albumin: 4.1 g/dL (ref 3.5–5.2)
Alkaline Phosphatase: 44 U/L (ref 39–117)
BUN: 32 mg/dL — ABNORMAL HIGH (ref 6–23)
CO2: 30 mEq/L (ref 19–32)
Calcium: 10 mg/dL (ref 8.4–10.5)
Chloride: 99 mEq/L (ref 96–112)
Creatinine, Ser: 1.28 mg/dL — ABNORMAL HIGH (ref 0.40–1.20)
GFR: 37.95 mL/min — ABNORMAL LOW (ref >60.00)
Glucose, Bld: 110 mg/dL — ABNORMAL HIGH (ref 70–99)
Potassium: 4.3 mEq/L (ref 3.5–5.1)
Sodium: 135 mEq/L (ref 135–145)
Total Bilirubin: 0.8 mg/dL (ref 0.2–1.2)
Total Protein: 6.9 g/dL (ref 6.0–8.3)

## 2022-12-13 LAB — TSH: TSH: 1.3 u[IU]/mL (ref 0.35–5.50)

## 2022-12-13 MED ORDER — CEFUROXIME AXETIL 250 MG PO TABS: 250.0000 mg | ORAL_TABLET | Freq: Two times a day (BID) | ORAL | 0 refills | Status: AC

## 2022-12-13 NOTE — Assessment & Plan Note (Signed)
Check CBC 

## 2022-12-13 NOTE — Progress Notes (Signed)
Subjective:  Patient ID: [name] [name], female    DOB: October 20, 1934  Age: 88 y.o. MRN: [id number]  CC: Night Sweats (Lower back pain, weakness and  now nights sweats with soaking through clothes)   HPI [name] [name] presents for LBP L side and night sweats - new starting Sunday Pt had a fever yesterday  Outpatient Medications Prior to Visit  Medication Sig Dispense Refill   cholecalciferol (VITAMIN D3) 25 MCG (1000 UT) tablet Take 1,000 Units by mouth daily.     Cyanocobalamin (B-12) 5000 MCG CAPS Take 1 tablet by mouth daily.     estradiol (ESTRACE) 0.1 MG/GM vaginal cream INSERT 1 APPLICATORFUL VAGINALLY 2 TIMES WEEKLY 42.5 g 0   FINACEA 15 % cream Apply 1 application topically daily.  2   folic acid (FOLVITE) 1 MG tablet Take 1 tablet (1 mg total) by mouth daily. TAKE 1 TABLET(1 MG) BY MOUTH DAILY 30 tablet 6   Glucosamine-Chondroit-Vit C-Mn (GLUCOSAMINE CHONDR 1500 COMPLX) CAPS Take 1 capsule by mouth daily.     imatinib (GLEEVEC) 100 MG tablet TAKE 3 TABLETS BY MOUTH EVERY DAY WITH A MEAL AND LARGE GLASS OF WATER 270 tablet 11   Loperamide HCl (IMODIUM PO) Take 0.5-1 tablets by mouth 2 (two) times daily as needed (takes 0.5 tablet every morning and 2nd dose if needed for diarrhea).      Multiple Vitamin (MULTIVITAMIN) tablet Take 1 tablet by mouth daily.     Multiple Vitamins-Minerals (PRESERVISION AREDS 2) CAPS Take 1 capsule by mouth 2 (two) times a day.     Probiotic Product (ALIGN) 4 MG CAPS Take 1 capsule by mouth every other day.      No facility-administered medications prior to visit.    ROS: Review of Systems  Constitutional:  Positive for diaphoresis, fatigue and fever. Negative for activity change, appetite change, chills and unexpected weight change.  HENT:  Negative for congestion, mouth sores and sinus pressure.   Eyes:  Negative for visual disturbance.  Respiratory:  Negative for cough and chest tightness.   Gastrointestinal:  Negative for abdominal pain  and nausea.  Genitourinary:  Negative for difficulty urinating, frequency and vaginal pain.  Musculoskeletal:  Positive for arthralgias, back pain and gait problem.  Skin:  Negative for pallor and rash.  Neurological:  Negative for dizziness, tremors, weakness, numbness and headaches.  Hematological:  Does not bruise/bleed easily.  Psychiatric/Behavioral:  Negative for confusion, sleep disturbance and suicidal ideas.     Objective:  BP 110/60 (BP Location: Right Arm, Patient Position: Sitting, Cuff Size: Large)   Pulse (!) 52   Temp 98.6 F (37 C) (Oral)   Ht 5\' 1"  (1.549 m)   Wt 120 lb (54.4 kg)   SpO2 96%   BMI 22.67 kg/m   BP Readings from Last 3 Encounters:  12/13/22 110/60  09/11/22 124/63  07/09/22 112/68    Wt Readings from Last 3 Encounters:  12/13/22 120 lb (54.4 kg)  09/11/22 121 lb 6.4 oz (55.1 kg)  07/09/22 119 lb 9.6 oz (54.3 kg)    Physical Exam Constitutional:      General: She is not in acute distress.    Appearance: She is well-developed. She is obese.  HENT:     Head: Normocephalic.     Right Ear: External ear normal.     Left Ear: External ear normal.     Nose: Nose normal.  Eyes:     General:        Right  eye: No discharge.        Left eye: No discharge.     Conjunctiva/sclera: Conjunctivae normal.     Pupils: Pupils are equal, round, and reactive to light.  Neck:     Thyroid: No thyromegaly.     Vascular: No JVD.     Trachea: No tracheal deviation.  Cardiovascular:     Rate and Rhythm: Normal rate and regular rhythm.     Heart sounds: Normal heart sounds.  Pulmonary:     Effort: No respiratory distress.     Breath sounds: No stridor. No wheezing.  Abdominal:     General: Bowel sounds are normal. There is no distension.     Palpations: Abdomen is soft. There is no mass.     Tenderness: There is no abdominal tenderness. There is no guarding or rebound.  Musculoskeletal:        General: Tenderness present.     Cervical back: Normal  range of motion and neck supple. No rigidity.     Right lower leg: No edema.     Left lower leg: No edema.  Lymphadenopathy:     Cervical: No cervical adenopathy.  Skin:    Findings: No erythema or rash.  Neurological:     Cranial Nerves: No cranial nerve deficit.     Motor: No abnormal muscle tone.     Coordination: Coordination normal.     Deep Tendon Reflexes: Reflexes normal.  Psychiatric:        Behavior: Behavior normal.        Thought Content: Thought content normal.        Judgment: Judgment normal.   L LS is slightly tender  Lab Results  Component Value Date   WBC 5.4 12/13/2022   HGB 9.0 Repeated and verified X2. (L) 12/13/2022   HCT 26.5 Repeated and verified X2. (L) 12/13/2022   PLT 149.0 (L) 12/13/2022   GLUCOSE 110 (H) 12/13/2022   CHOL 191 05/25/2022   TRIG 83.0 05/25/2022   HDL 82.90 05/25/2022   LDLCALC 92 05/25/2022   ALT 26 12/13/2022   AST 56 (H) 12/13/2022   NA 135 12/13/2022   K 4.3 12/13/2022   CL 99 12/13/2022   CREATININE 1.28 (H) 12/13/2022   BUN 32 (H) 12/13/2022   CO2 30 12/13/2022   TSH 1.30 12/13/2022   INR 1.0 02/26/2019   HGBA1C 5.4 05/25/2022    DG Pelvis Portable  Result Date: 03/02/2019 CLINICAL DATA:  Status post left hip replacement today. EXAM: PORTABLE PELVIS 1-2 VIEWS COMPARISON:  None. FINDINGS: Left total hip arthroplasty is in place. The device is located. No fracture. Gas in the soft tissues and surgical drain are noted. Soft tissue anchor projects adjacent to the left femoral neck and a second anchor is seen in the soft tissues peripheral and superior to the left greater trochanter. IMPRESSION: Status post left hip replacement.  No acute abnormality. Soft tissue anchors adjacent to the superior margin of the left femoral neck and in the soft tissues superior to the left greater trochanter noted. Electronically Signed   By: Drusilla Kanner M.D.   On: 03/02/2019 16:07   DG HIP OPERATIVE UNILAT W OR W/O PELVIS LEFT  Result  Date: 03/02/2019 CLINICAL DATA:  Post LEFT total hip replacement anterior approach EXAM: OPERATIVE LEFT HIP (WITH PELVIS IF PERFORMED) 2 VIEWS TECHNIQUE: Fluoroscopic spot image(s) were submitted for interpretation post-operatively. COMPARISON:  None FLUOROSCOPY TIME:  0 minutes 8 seconds Images submitted: 2 Dose: 1.1 mGy FINDINGS:  Osseous demineralization. LEFT hip prosthesis identified in expected position. No fracture, dislocation, or bone destruction. IMPRESSION: LEFT hip prosthesis without acute complication. Electronically Signed   By: Ulyses Southward M.D.   On: 03/02/2019 15:19   DG C-Arm 1-60 Min-No Report  Result Date: 03/02/2019 Fluoroscopy was utilized by the requesting physician.  No radiographic interpretation.    Assessment & Plan:   Problem List Items Addressed This Visit     Chronic myeloid leukemia (HCC)    Check CBC      Relevant Medications   cefUROXime (CEFTIN) 250 MG tablet   Other Relevant Orders   Urinalysis   Pancytopenia (HCC) - Primary    Check CBC      Relevant Orders   CBC with Differential/Platelet (Completed)   Urinalysis   Thrombocytopenia, unspecified (HCC)   Febrile illness, acute    Probable UTI Start Ceftin po      Relevant Orders   CBC with Differential/Platelet (Completed)   Comprehensive metabolic panel (Completed)   Urinalysis   TSH (Completed)      Meds ordered this encounter  Medications   cefUROXime (CEFTIN) 250 MG tablet    Sig: Take 1 tablet (250 mg total) by mouth 2 (two) times daily with a meal for 10 days.    Dispense:  20 tablet    Refill:  0      Follow-up: Return in about 2 weeks (around 12/27/2022) for f/u with PCP.  Sonda Primes, MD

## 2022-12-13 NOTE — Assessment & Plan Note (Signed)
Probable UTI Start Ceftin po

## 2022-12-14 ENCOUNTER — Encounter: Payer: Self-pay | Admitting: Internal Medicine

## 2022-12-14 LAB — URINALYSIS
Bilirubin Urine: NEGATIVE
Hgb urine dipstick: NEGATIVE
Ketones, ur: NEGATIVE
Leukocytes,Ua: NEGATIVE
Nitrite: NEGATIVE
Specific Gravity, Urine: 1.01 (ref 1.000–1.030)
Total Protein, Urine: NEGATIVE
Urine Glucose: NEGATIVE
Urobilinogen, UA: 0.2 (ref 0.0–1.0)
pH: 6 (ref 5.0–8.0)

## 2022-12-27 ENCOUNTER — Other Ambulatory Visit: Payer: Self-pay | Admitting: Internal Medicine

## 2023-01-24 ENCOUNTER — Ambulatory Visit (INDEPENDENT_AMBULATORY_CARE_PROVIDER_SITE_OTHER): Payer: Medicare PPO

## 2023-01-24 ENCOUNTER — Encounter: Payer: Self-pay | Admitting: Internal Medicine

## 2023-01-24 ENCOUNTER — Ambulatory Visit: Payer: Medicare PPO | Admitting: Internal Medicine

## 2023-01-24 VITALS — BP 146/98 | HR 79 | Temp 98.4°F | Ht 61.0 in | Wt 115.0 lb

## 2023-01-24 DIAGNOSIS — R0781 Pleurodynia: Secondary | ICD-10-CM | POA: Diagnosis not present

## 2023-01-24 DIAGNOSIS — Z23 Encounter for immunization: Secondary | ICD-10-CM

## 2023-01-24 NOTE — Progress Notes (Signed)
Subjective:   Patient ID: [name] [name], female    DOB: 1934-12-10, 88 y.o.   MRN: [id number]  GI Problem The primary symptoms include myalgias. Primary symptoms do not include abdominal pain, nausea, vomiting or diarrhea.  The illness does not include constipation.   The patient is an 88 YO female with fall due to slipping in shower 2 weeks ago. Pain ribs right and upper right abdomen. She has gradually improved but still having pain. Wanted to get checked.   Review of Systems  Constitutional: Negative.   HENT: Negative.    Eyes: Negative.   Respiratory:  Negative for cough, chest tightness and shortness of breath.   Cardiovascular:  Positive for chest pain. Negative for palpitations and leg swelling.  Gastrointestinal:  Negative for abdominal distention, abdominal pain, constipation, diarrhea, nausea and vomiting.  Musculoskeletal:  Positive for myalgias.  Skin: Negative.   Neurological: Negative.   Psychiatric/Behavioral: Negative.      Objective:  Physical Exam Constitutional:      Appearance: She is well-developed.  HENT:     Head: Normocephalic and atraumatic.  Cardiovascular:     Rate and Rhythm: Normal rate and regular rhythm.  Pulmonary:     Effort: Pulmonary effort is normal. No respiratory distress.     Breath sounds: Normal breath sounds. No wheezing or rales.     Comments: Right rib pain, lungs normal and able to take full breath without pain Chest:     Chest wall: Tenderness present.  Abdominal:     General: Bowel sounds are normal. There is no distension.     Palpations: Abdomen is soft.     Tenderness: There is no abdominal tenderness. There is no rebound.  Musculoskeletal:     Cervical back: Normal range of motion.  Skin:    General: Skin is warm and dry.  Neurological:     Mental Status: She is alert and oriented to person, place, and time.     Coordination: Coordination normal.     Vitals:   01/24/23 1050 01/24/23 1052  BP: (!) 146/98 (!)  146/98  Pulse: 79   Temp: 98.4 F (36.9 C)   TempSrc: Oral   SpO2: 95%   Weight: 115 lb (52.2 kg)   Height: 5\' 1"  (1.549 m)    Visit time 15 minutes in face to face communication with patient and coordination of care, additional 5 minutes spent in record review, coordination or care, ordering tests, communicating/referring to other healthcare professionals, documenting in medical records all on the same day of the visit for total time 20 minutes spent on the visit.   Assessment & Plan:  Flu shot given at visit

## 2023-01-24 NOTE — Assessment & Plan Note (Signed)
Ordered x-ray and given supportive care information. She is managing pain well with otc. Given timeline expectation for healing.

## 2023-01-24 NOTE — Patient Instructions (Signed)
We will check the x-ray of the ribs

## 2023-03-04 ENCOUNTER — Inpatient Hospital Stay: Payer: Medicare PPO | Attending: Hematology and Oncology

## 2023-03-04 DIAGNOSIS — Z79899 Other long term (current) drug therapy: Secondary | ICD-10-CM | POA: Insufficient documentation

## 2023-03-04 DIAGNOSIS — C9210 Chronic myeloid leukemia, BCR/ABL-positive, not having achieved remission: Secondary | ICD-10-CM | POA: Diagnosis not present

## 2023-03-04 DIAGNOSIS — D61810 Antineoplastic chemotherapy induced pancytopenia: Secondary | ICD-10-CM | POA: Diagnosis not present

## 2023-03-04 DIAGNOSIS — Z880 Allergy status to penicillin: Secondary | ICD-10-CM | POA: Insufficient documentation

## 2023-03-04 DIAGNOSIS — T451X5A Adverse effect of antineoplastic and immunosuppressive drugs, initial encounter: Secondary | ICD-10-CM | POA: Insufficient documentation

## 2023-03-04 LAB — CBC WITH DIFFERENTIAL/PLATELET
Abs Immature Granulocytes: 0.01 10*3/uL (ref 0.00–0.07)
Basophils Absolute: 0 10*3/uL (ref 0.0–0.1)
Basophils Relative: 1 %
Eosinophils Absolute: 0.1 10*3/uL (ref 0.0–0.5)
Eosinophils Relative: 3 %
HCT: 24.8 % — ABNORMAL LOW (ref 36.0–46.0)
Hemoglobin: 8.3 g/dL — ABNORMAL LOW (ref 12.0–15.0)
Immature Granulocytes: 0 %
Lymphocytes Relative: 23 %
Lymphs Abs: 1 10*3/uL (ref 0.7–4.0)
MCH: 37.4 pg — ABNORMAL HIGH (ref 26.0–34.0)
MCHC: 33.5 g/dL (ref 30.0–36.0)
MCV: 111.7 fL — ABNORMAL HIGH (ref 80.0–100.0)
Monocytes Absolute: 0.5 10*3/uL (ref 0.1–1.0)
Monocytes Relative: 11 %
Neutro Abs: 2.6 10*3/uL (ref 1.7–7.7)
Neutrophils Relative %: 62 %
Platelets: 136 10*3/uL — ABNORMAL LOW (ref 150–400)
RBC: 2.22 MIL/uL — ABNORMAL LOW (ref 3.87–5.11)
RDW: 14.4 % (ref 11.5–15.5)
WBC: 4.3 10*3/uL (ref 4.0–10.5)
nRBC: 0 % (ref 0.0–0.2)

## 2023-03-04 LAB — COMPREHENSIVE METABOLIC PANEL
ALT: 17 U/L (ref 0–44)
AST: 33 U/L (ref 15–41)
Albumin: 3.9 g/dL (ref 3.5–5.0)
Alkaline Phosphatase: 71 U/L (ref 38–126)
Anion gap: 3 — ABNORMAL LOW (ref 5–15)
BUN: 31 mg/dL — ABNORMAL HIGH (ref 8–23)
CO2: 31 mmol/L (ref 22–32)
Calcium: 9.6 mg/dL (ref 8.9–10.3)
Chloride: 104 mmol/L (ref 98–111)
Creatinine, Ser: 1.39 mg/dL — ABNORMAL HIGH (ref 0.44–1.00)
GFR, Estimated: 37 mL/min — ABNORMAL LOW (ref >60)
Glucose, Bld: 105 mg/dL — ABNORMAL HIGH (ref 70–99)
Potassium: 4.8 mmol/L (ref 3.5–5.1)
Sodium: 138 mmol/L (ref 135–145)
Total Bilirubin: 0.5 mg/dL (ref <1.2)
Total Protein: 6.7 g/dL (ref 6.5–8.1)

## 2023-03-07 LAB — BCR/ABL

## 2023-03-14 ENCOUNTER — Inpatient Hospital Stay: Payer: Medicare PPO | Admitting: Hematology and Oncology

## 2023-03-14 ENCOUNTER — Encounter: Payer: Self-pay | Admitting: Hematology and Oncology

## 2023-03-14 VITALS — BP 118/77 | HR 81 | Temp 97.4°F | Resp 18 | Ht 61.0 in | Wt 113.8 lb

## 2023-03-14 DIAGNOSIS — T451X5A Adverse effect of antineoplastic and immunosuppressive drugs, initial encounter: Secondary | ICD-10-CM

## 2023-03-14 DIAGNOSIS — D61810 Antineoplastic chemotherapy induced pancytopenia: Secondary | ICD-10-CM

## 2023-03-14 DIAGNOSIS — C9210 Chronic myeloid leukemia, BCR/ABL-positive, not having achieved remission: Secondary | ICD-10-CM

## 2023-03-14 DIAGNOSIS — Z79899 Other long term (current) drug therapy: Secondary | ICD-10-CM | POA: Diagnosis not present

## 2023-03-14 DIAGNOSIS — Z880 Allergy status to penicillin: Secondary | ICD-10-CM | POA: Diagnosis not present

## 2023-03-14 NOTE — Assessment & Plan Note (Signed)
Her blood count is stable So far, she tolerated treatment well without recent diarrhea, fluid retention or shortness of breath BCR/ABL is not detected and she remained in major molecular response We will continue treatment indefinitely

## 2023-03-14 NOTE — Progress Notes (Signed)
[facility] OFFICE PROGRESS NOTE  Patient Care Team: Pincus Sanes, MD as PCP - General (Internal Medicine) Rollene Rotunda, MD as PCP - Cardiology (Cardiology) Ollen Gross, MD as Consulting Physician (Orthopedic Surgery) Artis Delay, MD as Consulting Physician (Hematology and Oncology) Mateo Flow, MD as Consulting Physician (Ophthalmology)  ASSESSMENT & PLAN:  Chronic myeloid leukemia (HCC) Her blood count is stable So far, she tolerated treatment well without recent diarrhea, fluid retention or shortness of breath BCR/ABL is not detected and she remained in major molecular response We will continue treatment indefinitely   Pancytopenia due to antineoplastic chemotherapy [facility]) She has slight worsening pancytopenia Her chronic thrombocytopenia is likely related to her medication With her progressive anemia, I am concerned about worsening blood count issues in the future This is multifactorial, likely combination of chronic kidney disease as well as related to her medication We discussed risk and benefits for further workup including future treatment with erythropoietin stimulating agents The patient is undecided but will call me next week for further follow-up  No orders of the defined types were placed in this encounter.   All questions were answered. The patient knows to call the clinic with any problems, questions or concerns. The total time spent in the appointment was 30 minutes encounter with patients including review of chart and various tests results, discussions about plan of care and coordination of care plan   Artis Delay, MD 03/14/2023 12:14 PM  INTERVAL HISTORY: Please see below for problem oriented charting. she returns for surveillance follow-up on Gleevec for CML She is compliant taking her medication as directed We discussed test results and signs of worsening pancytopenia Thankfully, she is not symptomatic  REVIEW OF SYSTEMS:    Constitutional: Denies fevers, chills or abnormal weight loss Eyes: Denies blurriness of vision Ears, nose, mouth, throat, and face: Denies mucositis or sore throat Respiratory: Denies cough, dyspnea or wheezes Cardiovascular: Denies palpitation, chest discomfort or lower extremity swelling Gastrointestinal:  Denies nausea, heartburn or change in bowel habits Skin: Denies abnormal skin rashes Lymphatics: Denies new lymphadenopathy or easy bruising Neurological:Denies numbness, tingling or new weaknesses Behavioral/Psych: Mood is stable, no new changes  All other systems were reviewed with the patient and are negative.  I have reviewed the past medical history, past surgical history, social history and family history with the patient and they are unchanged from previous note.  ALLERGIES:  is allergic to penicillins.  MEDICATIONS:  Current Outpatient Medications  Medication Sig Dispense Refill   cholecalciferol (VITAMIN D3) 25 MCG (1000 UT) tablet Take 1,000 Units by mouth daily.     Cyanocobalamin (B-12) 5000 MCG CAPS Take 1 tablet by mouth daily.     estradiol (ESTRACE) 0.1 MG/GM vaginal cream INSERT 1 APPLICATORFUL VAGINALLY 2 TIMES WEEKLY 42.5 g 0   FINACEA 15 % cream Apply 1 application topically daily.  2   folic acid (FOLVITE) 1 MG tablet TAKE 1 TABLET BY MOUTH EVERY DAY 30 tablet 6   Glucosamine-Chondroit-Vit C-Mn (GLUCOSAMINE CHONDR 1500 COMPLX) CAPS Take 1 capsule by mouth daily.     imatinib (GLEEVEC) 100 MG tablet TAKE 3 TABLETS BY MOUTH EVERY DAY WITH A MEAL AND LARGE GLASS OF WATER 270 tablet 11   Loperamide HCl (IMODIUM PO) Take 0.5-1 tablets by mouth 2 (two) times daily as needed (takes 0.5 tablet every morning and 2nd dose if needed for diarrhea).      Multiple Vitamin (MULTIVITAMIN) tablet Take 1 tablet by mouth daily.     Multiple  Vitamins-Minerals (PRESERVISION AREDS 2) CAPS Take 1 capsule by mouth 2 (two) times a day.     Probiotic Product (ALIGN) 4 MG CAPS Take 1  capsule by mouth every other day.      No current facility-administered medications for this visit.    SUMMARY OF ONCOLOGIC HISTORY: Oncology History  Chronic myeloid leukemia (HCC)  02/18/2007 Initial Diagnosis   Chronic myeloid leukemia   08/20/2014 Tumor Marker   BCR/ABL is undetectable   03/04/2015 Pathology Results   BCR/ABL is undetectable   08/29/2015 Tumor Marker   BCR/ABL is undetectable   02/29/2016 Pathology Results   BCR/ABL is undetectable   05/01/2016 Pathology Results   BCR/ABL b2a2 is detected at 0.03% and IS ratio 0.069%   05/31/2016 Pathology Results   BCR/ABL b2a2 is detected at 0.12% and IS ratio 0.276%   06/19/2016 Pathology Results   BCR/ABL b2a2 is detected at 0.24% and IS ratio 0.552%   06/19/2016 Miscellaneous   ABL mutation kinase testing is negative   06/27/2016 Miscellaneous   Dose of Gleevec is increased to 600 mg daily   07/25/2016 Pathology Results   BCR/ABL b2a2 is detected at 0.03% and IS ratio 0.069%. She has achieved MMR   08/30/2016 Miscellaneous   Dose of Gleevec is reduced to 300 mg daily and subsequently down to 200 mg daily   10/22/2016 Pathology Results   BCR/ABL b2a2 is not detected. She has achieved MMR   01/03/2017 Pathology Results   BCR/ABL b2a2 is detected IS ratio 0.041%   02/18/2017 Pathology Results   BCR/ABL b2a2 is detected IS ratio 0.0126   05/21/2017 Pathology Results   BCR/ABL b2a2 is not detected. She has achieved MMR   08/20/2017 Pathology Results   BCR/ABL b2a2 is not detected. She has achieved MMR   11/12/2017 Pathology Results   BCR/ABL b2a2 is not detected   04/07/2018 Pathology Results   BCR/ABL b2a2 is detected IS ratio 0.0062. She is in MMR   06/27/2018 Pathology Results   BCR/ABL b2a2 is detected IS ratio 0.0097%. She is in MMR   08/29/2018 Pathology Results   BCR/ABL is undetectable. She is in MMR   12/26/2018 Pathology Results   BCR/ABL b2a2 is detected IS ratio 0.0042%. She is in MMR   05/04/2019  Pathology Results   BCR/ABL b2a2 is detected IS ratio 0.0575%. She is in MMR   08/03/2019 Pathology Results   BCR/ABL b2a2 is detected IS ratio 0.0167%. She is in MMR   02/01/2020 Pathology Results   BCR/ABL is undetectable   08/01/2020 Pathology Results   BCR/ABL is undetectable   02/28/2021 Pathology Results   BCR/ABL is undetectable   09/04/2021 Pathology Results   BCR/ABL is undetectable   02/28/2022 Pathology Results   BCR/ABL is undetectable    08/31/2022 Pathology Results   BCR/ABL is undetectable    03/04/2023 Pathology Results   BCR/ABL is undetectable      PHYSICAL EXAMINATION: ECOG PERFORMANCE STATUS: 1 - Symptomatic but completely ambulatory  Vitals:   03/14/23 1134  BP: 118/77  Pulse: 81  Resp: 18  Temp: (!) 97.4 F (36.3 C)  SpO2: 100%   Filed Weights   03/14/23 1134  Weight: 113 lb 12.8 oz (51.6 kg)    GENERAL:alert, no distress and comfortable. She looks Pale   LABORATORY DATA:  I have reviewed the data as listed    Component Value Date/Time   NA 138 03/04/2023 1149   NA 140 02/18/2017 0808   K 4.8 03/04/2023 1149  K 4.4 02/18/2017 0808   CL 104 03/04/2023 1149   CL 105 09/04/2012 1006   CO2 31 03/04/2023 1149   CO2 29 02/18/2017 0808   GLUCOSE 105 (H) 03/04/2023 1149   GLUCOSE 86 02/18/2017 0808   GLUCOSE 96 09/04/2012 1006   BUN 31 (H) 03/04/2023 1149   BUN 20.3 02/18/2017 0808   CREATININE 1.39 (H) 03/04/2023 1149   CREATININE 0.93 05/04/2019 1108   CREATININE 1.1 02/18/2017 0808   CALCIUM 9.6 03/04/2023 1149   CALCIUM 9.4 02/18/2017 0808   PROT 6.7 03/04/2023 1149   PROT 6.6 02/18/2017 0808   ALBUMIN 3.9 03/04/2023 1149   ALBUMIN 4.2 02/18/2017 0808   AST 33 03/04/2023 1149   AST 34 05/04/2019 1108   AST 34 02/18/2017 0808   ALT 17 03/04/2023 1149   ALT 19 05/04/2019 1108   ALT 18 02/18/2017 0808   ALKPHOS 71 03/04/2023 1149   ALKPHOS 53 02/18/2017 0808   BILITOT 0.5 03/04/2023 1149   BILITOT 0.9 05/04/2019 1108   BILITOT  0.76 02/18/2017 0808   GFRNONAA 37 (L) 03/04/2023 1149   GFRNONAA 57 (L) 05/04/2019 1108   GFRAA 45 (L) 02/01/2020 1023   GFRAA >60 05/04/2019 1108    No results found for: "SPEP", "UPEP"  Lab Results  Component Value Date   WBC 4.3 03/04/2023   NEUTROABS 2.6 03/04/2023   HGB 8.3 (L) 03/04/2023   HCT 24.8 (L) 03/04/2023   MCV 111.7 (H) 03/04/2023   PLT 136 (L) 03/04/2023      Chemistry      Component Value Date/Time   NA 138 03/04/2023 1149   NA 140 02/18/2017 0808   K 4.8 03/04/2023 1149   K 4.4 02/18/2017 0808   CL 104 03/04/2023 1149   CL 105 09/04/2012 1006   CO2 31 03/04/2023 1149   CO2 29 02/18/2017 0808   BUN 31 (H) 03/04/2023 1149   BUN 20.3 02/18/2017 0808   CREATININE 1.39 (H) 03/04/2023 1149   CREATININE 0.93 05/04/2019 1108   CREATININE 1.1 02/18/2017 0808      Component Value Date/Time   CALCIUM 9.6 03/04/2023 1149   CALCIUM 9.4 02/18/2017 0808   ALKPHOS 71 03/04/2023 1149   ALKPHOS 53 02/18/2017 0808   AST 33 03/04/2023 1149   AST 34 05/04/2019 1108   AST 34 02/18/2017 0808   ALT 17 03/04/2023 1149   ALT 19 05/04/2019 1108   ALT 18 02/18/2017 0808   BILITOT 0.5 03/04/2023 1149   BILITOT 0.9 05/04/2019 1108   BILITOT 0.76 02/18/2017 0623

## 2023-03-14 NOTE — Assessment & Plan Note (Signed)
She has slight worsening pancytopenia Her chronic thrombocytopenia is likely related to her medication With her progressive anemia, I am concerned about worsening blood count issues in the future This is multifactorial, likely combination of chronic kidney disease as well as related to her medication We discussed risk and benefits for further workup including future treatment with erythropoietin stimulating agents The patient is undecided but will call me next week for further follow-up

## 2023-03-15 ENCOUNTER — Other Ambulatory Visit: Payer: Self-pay | Admitting: Hematology and Oncology

## 2023-03-15 ENCOUNTER — Telehealth: Payer: Self-pay

## 2023-03-15 DIAGNOSIS — D539 Nutritional anemia, unspecified: Secondary | ICD-10-CM | POA: Insufficient documentation

## 2023-03-15 NOTE — Telephone Encounter (Signed)
Called back and scheduled appts. She is aware of appts.

## 2023-03-15 NOTE — Telephone Encounter (Signed)
I have placed a bunch of orders Schedule a labs appt and then see me back 10 days later to discuss test result

## 2023-03-15 NOTE — Telephone Encounter (Signed)
She called and left a message. She has decided after yesterdays appt that she would like to get treatment.

## 2023-03-15 NOTE — Telephone Encounter (Signed)
Called and left a message with husband. He will have [name] call the office back.

## 2023-03-18 ENCOUNTER — Other Ambulatory Visit: Payer: Medicare PPO

## 2023-03-18 DIAGNOSIS — Z79899 Other long term (current) drug therapy: Secondary | ICD-10-CM | POA: Diagnosis not present

## 2023-03-18 DIAGNOSIS — C9210 Chronic myeloid leukemia, BCR/ABL-positive, not having achieved remission: Secondary | ICD-10-CM | POA: Diagnosis not present

## 2023-03-18 DIAGNOSIS — D539 Nutritional anemia, unspecified: Secondary | ICD-10-CM

## 2023-03-18 DIAGNOSIS — T451X5A Adverse effect of antineoplastic and immunosuppressive drugs, initial encounter: Secondary | ICD-10-CM | POA: Diagnosis not present

## 2023-03-18 DIAGNOSIS — Z880 Allergy status to penicillin: Secondary | ICD-10-CM | POA: Diagnosis not present

## 2023-03-18 DIAGNOSIS — D61810 Antineoplastic chemotherapy induced pancytopenia: Secondary | ICD-10-CM | POA: Diagnosis not present

## 2023-03-18 LAB — CBC WITH DIFFERENTIAL (CANCER CENTER ONLY)
Abs Immature Granulocytes: 0.01 K/uL (ref 0.00–0.07)
Basophils Absolute: 0 K/uL (ref 0.0–0.1)
Basophils Relative: 1 %
Eosinophils Absolute: 0.1 K/uL (ref 0.0–0.5)
Eosinophils Relative: 4 %
HCT: 26.2 % — ABNORMAL LOW (ref 36.0–46.0)
Hemoglobin: 8.7 g/dL — ABNORMAL LOW (ref 12.0–15.0)
Immature Granulocytes: 0 %
Lymphocytes Relative: 32 %
Lymphs Abs: 1.3 K/uL (ref 0.7–4.0)
MCH: 37.8 pg — ABNORMAL HIGH (ref 26.0–34.0)
MCHC: 33.2 g/dL (ref 30.0–36.0)
MCV: 113.9 fL — ABNORMAL HIGH (ref 80.0–100.0)
Monocytes Absolute: 0.5 K/uL (ref 0.1–1.0)
Monocytes Relative: 12 %
Neutro Abs: 2.1 K/uL (ref 1.7–7.7)
Neutrophils Relative %: 51 %
Platelet Count: 145 K/uL — ABNORMAL LOW (ref 150–400)
RBC: 2.3 MIL/uL — ABNORMAL LOW (ref 3.87–5.11)
RDW: 14.9 % (ref 11.5–15.5)
WBC Count: 4 K/uL (ref 4.0–10.5)
nRBC: 0 % (ref 0.0–0.2)

## 2023-03-18 LAB — IRON AND IRON BINDING CAPACITY (CC-WL,HP ONLY)
Iron: 86 ug/dL (ref 28–170)
Saturation Ratios: 29 % (ref 10.4–31.8)
TIBC: 295 ug/dL (ref 250–450)
UIBC: 209 ug/dL (ref 148–442)

## 2023-03-18 LAB — SEDIMENTATION RATE: Sed Rate: 8 mm/h (ref 0–22)

## 2023-03-18 LAB — VITAMIN B12: Vitamin B-12: 1280 pg/mL — ABNORMAL HIGH (ref 180–914)

## 2023-03-18 LAB — FERRITIN: Ferritin: 160 ng/mL (ref 11–307)

## 2023-03-18 LAB — RETICULOCYTES
Immature Retic Fract: 11 % (ref 2.3–15.9)
RBC.: 2.31 MIL/uL — ABNORMAL LOW (ref 3.87–5.11)
Retic Count, Absolute: 36.3 K/uL (ref 19.0–186.0)
Retic Ct Pct: 1.6 % (ref 0.4–3.1)

## 2023-03-18 LAB — LACTATE DEHYDROGENASE: LDH: 258 U/L — ABNORMAL HIGH (ref 98–192)

## 2023-03-19 ENCOUNTER — Telehealth: Payer: Self-pay

## 2023-03-19 LAB — KAPPA/LAMBDA LIGHT CHAINS
Kappa free light chain: 32.9 mg/L — ABNORMAL HIGH (ref 3.3–19.4)
Kappa, lambda light chain ratio: 1.78 — ABNORMAL HIGH (ref 0.26–1.65)
Lambda free light chains: 18.5 mg/L (ref 5.7–26.3)

## 2023-03-19 LAB — ERYTHROPOIETIN: Erythropoietin: 32.2 m[IU]/mL — ABNORMAL HIGH (ref 2.6–18.5)

## 2023-03-19 NOTE — Telephone Encounter (Signed)
Called and left a message asking her to call the office back. Offering earlier appt with Dr. Bertis Ruddy on 11/21 at 1040.

## 2023-03-20 NOTE — Telephone Encounter (Signed)
Patient returned call regarding earlier appointment. Patient agreeable to coming in tomorrow. 11/21 at 10:40.  Appointment updated.

## 2023-03-21 ENCOUNTER — Telehealth: Payer: Self-pay

## 2023-03-21 ENCOUNTER — Inpatient Hospital Stay: Payer: Medicare PPO | Admitting: Hematology and Oncology

## 2023-03-21 ENCOUNTER — Encounter: Payer: Self-pay | Admitting: Hematology and Oncology

## 2023-03-21 VITALS — BP 118/69 | HR 84 | Temp 98.7°F | Resp 18 | Ht 61.0 in | Wt 114.8 lb

## 2023-03-21 DIAGNOSIS — D638 Anemia in other chronic diseases classified elsewhere: Secondary | ICD-10-CM | POA: Diagnosis not present

## 2023-03-21 DIAGNOSIS — N189 Chronic kidney disease, unspecified: Secondary | ICD-10-CM

## 2023-03-21 DIAGNOSIS — C9211 Chronic myeloid leukemia, BCR/ABL-positive, in remission: Secondary | ICD-10-CM

## 2023-03-21 DIAGNOSIS — T451X5A Adverse effect of antineoplastic and immunosuppressive drugs, initial encounter: Secondary | ICD-10-CM | POA: Diagnosis not present

## 2023-03-21 DIAGNOSIS — C9210 Chronic myeloid leukemia, BCR/ABL-positive, not having achieved remission: Secondary | ICD-10-CM | POA: Diagnosis not present

## 2023-03-21 DIAGNOSIS — Z880 Allergy status to penicillin: Secondary | ICD-10-CM | POA: Diagnosis not present

## 2023-03-21 DIAGNOSIS — Z79899 Other long term (current) drug therapy: Secondary | ICD-10-CM | POA: Diagnosis not present

## 2023-03-21 DIAGNOSIS — D61810 Antineoplastic chemotherapy induced pancytopenia: Secondary | ICD-10-CM | POA: Diagnosis not present

## 2023-03-21 NOTE — Telephone Encounter (Signed)
Called to give appt for bone marrow on 12/4 at 0730. She is not available that day.  Told her that I would call her back after speaking with Dr. Bertis Ruddy.

## 2023-03-21 NOTE — Progress Notes (Signed)
[facility] OFFICE PROGRESS NOTE  Patient Care Team: Pincus Sanes, MD as PCP - General (Internal Medicine) Rollene Rotunda, MD as PCP - Cardiology (Cardiology) Ollen Gross, MD as Consulting Physician (Orthopedic Surgery) Artis Delay, MD as Consulting Physician (Hematology and Oncology) Mateo Flow, MD as Consulting Physician (Ophthalmology)  ASSESSMENT & PLAN:  Anemia, chronic disease I have reviewed multiple test results with the patient and her husband The cause of her anemia is multifactorial, combination of anemia chronic kidney disease and CML in remission I am concerned about elevated MCV Given her history of malignancy, I think is prudent to rule out bone marrow disorder such as myelodysplastic syndrome before we prescribe erythropoietin stimulating agent We discussed risk and benefits of bone marrow biopsy and she agreed to proceed with it done and sedated Due to the holidays, we will get her scheduled soon as possible but possibly first week of December  Orders Placed This Encounter  Procedures   CBC with Differential (Cancer Center Only)    Standing Status:   Future    Standing Expiration Date:   03/20/2024    All questions were answered. The patient knows to call the clinic with any problems, questions or concerns. The total time spent in the appointment was 20 minutes encounter with patients including review of chart and various tests results, discussions about plan of care and coordination of care plan   Artis Delay, MD 03/21/2023 12:31 PM  INTERVAL HISTORY: Please see below for problem oriented charting. she returns for review test results She is here accompanied by her husband I reviewed multiple blood test result with her and discussed risk and benefits of bone marrow biopsy Thankfully, she is not symptomatic from anemia  REVIEW OF SYSTEMS:   Constitutional: Denies fevers, chills or abnormal weight loss Eyes: Denies blurriness of  vision Ears, nose, mouth, throat, and face: Denies mucositis or sore throat Respiratory: Denies cough, dyspnea or wheezes Cardiovascular: Denies palpitation, chest discomfort or lower extremity swelling Gastrointestinal:  Denies nausea, heartburn or change in bowel habits Skin: Denies abnormal skin rashes Lymphatics: Denies new lymphadenopathy or easy bruising Neurological:Denies numbness, tingling or new weaknesses Behavioral/Psych: Mood is stable, no new changes  All other systems were reviewed with the patient and are negative.  I have reviewed the past medical history, past surgical history, social history and family history with the patient and they are unchanged from previous note.  ALLERGIES:  is allergic to penicillins.  MEDICATIONS:  Current Outpatient Medications  Medication Sig Dispense Refill   cholecalciferol (VITAMIN D3) 25 MCG (1000 UT) tablet Take 1,000 Units by mouth daily.     Cyanocobalamin (B-12) 5000 MCG CAPS Take 1 tablet by mouth daily.     estradiol (ESTRACE) 0.1 MG/GM vaginal cream INSERT 1 APPLICATORFUL VAGINALLY 2 TIMES WEEKLY 42.5 g 0   FINACEA 15 % cream Apply 1 application topically daily.  2   folic acid (FOLVITE) 1 MG tablet TAKE 1 TABLET BY MOUTH EVERY DAY 30 tablet 6   Glucosamine-Chondroit-Vit C-Mn (GLUCOSAMINE CHONDR 1500 COMPLX) CAPS Take 1 capsule by mouth daily.     imatinib (GLEEVEC) 100 MG tablet TAKE 3 TABLETS BY MOUTH EVERY DAY WITH A MEAL AND LARGE GLASS OF WATER 270 tablet 11   Loperamide HCl (IMODIUM PO) Take 0.5-1 tablets by mouth 2 (two) times daily as needed (takes 0.5 tablet every morning and 2nd dose if needed for diarrhea).      Multiple Vitamin (MULTIVITAMIN) tablet Take 1 tablet by mouth  daily.     Multiple Vitamins-Minerals (PRESERVISION AREDS 2) CAPS Take 1 capsule by mouth 2 (two) times a day.     Probiotic Product (ALIGN) 4 MG CAPS Take 1 capsule by mouth every other day.      No current facility-administered medications for this  visit.    SUMMARY OF ONCOLOGIC HISTORY: Oncology History  Chronic myeloid leukemia (HCC)  02/18/2007 Initial Diagnosis   Chronic myeloid leukemia   08/20/2014 Tumor Marker   BCR/ABL is undetectable   03/04/2015 Pathology Results   BCR/ABL is undetectable   08/29/2015 Tumor Marker   BCR/ABL is undetectable   02/29/2016 Pathology Results   BCR/ABL is undetectable   05/01/2016 Pathology Results   BCR/ABL b2a2 is detected at 0.03% and IS ratio 0.069%   05/31/2016 Pathology Results   BCR/ABL b2a2 is detected at 0.12% and IS ratio 0.276%   06/19/2016 Pathology Results   BCR/ABL b2a2 is detected at 0.24% and IS ratio 0.552%   06/19/2016 Miscellaneous   ABL mutation kinase testing is negative   06/27/2016 Miscellaneous   Dose of Gleevec is increased to 600 mg daily   07/25/2016 Pathology Results   BCR/ABL b2a2 is detected at 0.03% and IS ratio 0.069%. She has achieved MMR   08/30/2016 Miscellaneous   Dose of Gleevec is reduced to 300 mg daily and subsequently down to 200 mg daily   10/22/2016 Pathology Results   BCR/ABL b2a2 is not detected. She has achieved MMR   01/03/2017 Pathology Results   BCR/ABL b2a2 is detected IS ratio 0.041%   02/18/2017 Pathology Results   BCR/ABL b2a2 is detected IS ratio 0.0126   05/21/2017 Pathology Results   BCR/ABL b2a2 is not detected. She has achieved MMR   08/20/2017 Pathology Results   BCR/ABL b2a2 is not detected. She has achieved MMR   11/12/2017 Pathology Results   BCR/ABL b2a2 is not detected   04/07/2018 Pathology Results   BCR/ABL b2a2 is detected IS ratio 0.0062. She is in MMR   06/27/2018 Pathology Results   BCR/ABL b2a2 is detected IS ratio 0.0097%. She is in MMR   08/29/2018 Pathology Results   BCR/ABL is undetectable. She is in MMR   12/26/2018 Pathology Results   BCR/ABL b2a2 is detected IS ratio 0.0042%. She is in MMR   05/04/2019 Pathology Results   BCR/ABL b2a2 is detected IS ratio 0.0575%. She is in MMR   08/03/2019 Pathology  Results   BCR/ABL b2a2 is detected IS ratio 0.0167%. She is in MMR   02/01/2020 Pathology Results   BCR/ABL is undetectable   08/01/2020 Pathology Results   BCR/ABL is undetectable   02/28/2021 Pathology Results   BCR/ABL is undetectable   09/04/2021 Pathology Results   BCR/ABL is undetectable   02/28/2022 Pathology Results   BCR/ABL is undetectable    08/31/2022 Pathology Results   BCR/ABL is undetectable    03/04/2023 Pathology Results   BCR/ABL is undetectable      PHYSICAL EXAMINATION: ECOG PERFORMANCE STATUS: 0 - Asymptomatic  Vitals:   03/21/23 1116  BP: 118/69  Pulse: 84  Resp: 18  Temp: 98.7 F (37.1 C)  SpO2: 98%   Filed Weights   03/21/23 1116  Weight: 114 lb 12.8 oz (52.1 kg)    GENERAL:alert, no distress and comfortable   LABORATORY DATA:  I have reviewed the data as listed    Component Value Date/Time   NA 138 03/04/2023 1149   NA 140 02/18/2017 0808   K 4.8 03/04/2023 1149  K 4.4 02/18/2017 0808   CL 104 03/04/2023 1149   CL 105 09/04/2012 1006   CO2 31 03/04/2023 1149   CO2 29 02/18/2017 0808   GLUCOSE 105 (H) 03/04/2023 1149   GLUCOSE 86 02/18/2017 0808   GLUCOSE 96 09/04/2012 1006   BUN 31 (H) 03/04/2023 1149   BUN 20.3 02/18/2017 0808   CREATININE 1.39 (H) 03/04/2023 1149   CREATININE 0.93 05/04/2019 1108   CREATININE 1.1 02/18/2017 0808   CALCIUM 9.6 03/04/2023 1149   CALCIUM 9.4 02/18/2017 0808   PROT 6.7 03/04/2023 1149   PROT 6.6 02/18/2017 0808   ALBUMIN 3.9 03/04/2023 1149   ALBUMIN 4.2 02/18/2017 0808   AST 33 03/04/2023 1149   AST 34 05/04/2019 1108   AST 34 02/18/2017 0808   ALT 17 03/04/2023 1149   ALT 19 05/04/2019 1108   ALT 18 02/18/2017 0808   ALKPHOS 71 03/04/2023 1149   ALKPHOS 53 02/18/2017 0808   BILITOT 0.5 03/04/2023 1149   BILITOT 0.9 05/04/2019 1108   BILITOT 0.76 02/18/2017 0808   GFRNONAA 37 (L) 03/04/2023 1149   GFRNONAA 57 (L) 05/04/2019 1108   GFRAA 45 (L) 02/01/2020 1023   GFRAA >60 05/04/2019  1108    No results found for: "SPEP", "UPEP"  Lab Results  Component Value Date   WBC 4.0 03/18/2023   NEUTROABS 2.1 03/18/2023   HGB 8.7 (L) 03/18/2023   HCT 26.2 (L) 03/18/2023   MCV 113.9 (H) 03/18/2023   PLT 145 (L) 03/18/2023      Chemistry      Component Value Date/Time   NA 138 03/04/2023 1149   NA 140 02/18/2017 0808   K 4.8 03/04/2023 1149   K 4.4 02/18/2017 0808   CL 104 03/04/2023 1149   CL 105 09/04/2012 1006   CO2 31 03/04/2023 1149   CO2 29 02/18/2017 0808   BUN 31 (H) 03/04/2023 1149   BUN 20.3 02/18/2017 0808   CREATININE 1.39 (H) 03/04/2023 1149   CREATININE 0.93 05/04/2019 1108   CREATININE 1.1 02/18/2017 0808      Component Value Date/Time   CALCIUM 9.6 03/04/2023 1149   CALCIUM 9.4 02/18/2017 0808   ALKPHOS 71 03/04/2023 1149   ALKPHOS 53 02/18/2017 0808   AST 33 03/04/2023 1149   AST 34 05/04/2019 1108   AST 34 02/18/2017 0808   ALT 17 03/04/2023 1149   ALT 19 05/04/2019 1108   ALT 18 02/18/2017 0808   BILITOT 0.5 03/04/2023 1149   BILITOT 0.9 05/04/2019 1108   BILITOT 0.76 02/18/2017 6213

## 2023-03-21 NOTE — Assessment & Plan Note (Signed)
I have reviewed multiple test results with the patient and her husband The cause of her anemia is multifactorial, combination of anemia chronic kidney disease and CML in remission I am concerned about elevated MCV Given her history of malignancy, I think is prudent to rule out bone marrow disorder such as myelodysplastic syndrome before we prescribe erythropoietin stimulating agent We discussed risk and benefits of bone marrow biopsy and she agreed to proceed with it done and sedated Due to the holidays, we will get her scheduled soon as possible but possibly first week of December

## 2023-03-22 DIAGNOSIS — D84821 Immunodeficiency due to drugs: Secondary | ICD-10-CM | POA: Diagnosis not present

## 2023-03-22 DIAGNOSIS — M8580 Other specified disorders of bone density and structure, unspecified site: Secondary | ICD-10-CM | POA: Diagnosis not present

## 2023-03-22 DIAGNOSIS — R030 Elevated blood-pressure reading, without diagnosis of hypertension: Secondary | ICD-10-CM | POA: Diagnosis not present

## 2023-03-22 DIAGNOSIS — C9511 Chronic leukemia of unspecified cell type, in remission: Secondary | ICD-10-CM | POA: Diagnosis not present

## 2023-03-22 DIAGNOSIS — Z880 Allergy status to penicillin: Secondary | ICD-10-CM | POA: Diagnosis not present

## 2023-03-22 DIAGNOSIS — Z791 Long term (current) use of non-steroidal anti-inflammatories (NSAID): Secondary | ICD-10-CM | POA: Diagnosis not present

## 2023-03-22 DIAGNOSIS — Z809 Family history of malignant neoplasm, unspecified: Secondary | ICD-10-CM | POA: Diagnosis not present

## 2023-03-22 DIAGNOSIS — M1990 Unspecified osteoarthritis, unspecified site: Secondary | ICD-10-CM | POA: Diagnosis not present

## 2023-03-22 DIAGNOSIS — Z87891 Personal history of nicotine dependence: Secondary | ICD-10-CM | POA: Diagnosis not present

## 2023-03-22 NOTE — Telephone Encounter (Signed)
Called and give appt for bone marrow biopsy on 12/10 at 0730. She is aware of appt.  Flow lab is aware of appt time/date.

## 2023-04-03 ENCOUNTER — Other Ambulatory Visit: Payer: Medicare PPO

## 2023-04-04 ENCOUNTER — Ambulatory Visit: Payer: Medicare PPO | Admitting: Hematology and Oncology

## 2023-04-09 ENCOUNTER — Inpatient Hospital Stay: Payer: Medicare PPO | Attending: Hematology and Oncology | Admitting: Hematology and Oncology

## 2023-04-09 ENCOUNTER — Other Ambulatory Visit: Payer: Self-pay | Admitting: Hematology and Oncology

## 2023-04-09 ENCOUNTER — Inpatient Hospital Stay: Payer: Medicare PPO

## 2023-04-09 VITALS — BP 114/86 | HR 72 | Temp 98.7°F | Resp 16

## 2023-04-09 DIAGNOSIS — D539 Nutritional anemia, unspecified: Secondary | ICD-10-CM | POA: Diagnosis not present

## 2023-04-09 DIAGNOSIS — D696 Thrombocytopenia, unspecified: Secondary | ICD-10-CM | POA: Diagnosis not present

## 2023-04-09 DIAGNOSIS — Z880 Allergy status to penicillin: Secondary | ICD-10-CM | POA: Insufficient documentation

## 2023-04-09 DIAGNOSIS — Z79899 Other long term (current) drug therapy: Secondary | ICD-10-CM | POA: Insufficient documentation

## 2023-04-09 DIAGNOSIS — D61810 Antineoplastic chemotherapy induced pancytopenia: Secondary | ICD-10-CM

## 2023-04-09 DIAGNOSIS — T451X5A Adverse effect of antineoplastic and immunosuppressive drugs, initial encounter: Secondary | ICD-10-CM | POA: Diagnosis not present

## 2023-04-09 DIAGNOSIS — C9210 Chronic myeloid leukemia, BCR/ABL-positive, not having achieved remission: Secondary | ICD-10-CM | POA: Diagnosis not present

## 2023-04-09 DIAGNOSIS — D638 Anemia in other chronic diseases classified elsewhere: Secondary | ICD-10-CM | POA: Insufficient documentation

## 2023-04-09 LAB — CBC WITH DIFFERENTIAL (CANCER CENTER ONLY)
Abs Immature Granulocytes: 0.01 10*3/uL (ref 0.00–0.07)
Basophils Absolute: 0 10*3/uL (ref 0.0–0.1)
Basophils Relative: 0 %
Eosinophils Absolute: 0.1 10*3/uL (ref 0.0–0.5)
Eosinophils Relative: 2 %
HCT: 25.9 % — ABNORMAL LOW (ref 36.0–46.0)
Hemoglobin: 8.7 g/dL — ABNORMAL LOW (ref 12.0–15.0)
Immature Granulocytes: 0 %
Lymphocytes Relative: 16 %
Lymphs Abs: 1 10*3/uL (ref 0.7–4.0)
MCH: 38.3 pg — ABNORMAL HIGH (ref 26.0–34.0)
MCHC: 33.6 g/dL (ref 30.0–36.0)
MCV: 114.1 fL — ABNORMAL HIGH (ref 80.0–100.0)
Monocytes Absolute: 0.6 10*3/uL (ref 0.1–1.0)
Monocytes Relative: 10 %
Neutro Abs: 4.3 10*3/uL (ref 1.7–7.7)
Neutrophils Relative %: 72 %
Platelet Count: 121 10*3/uL — ABNORMAL LOW (ref 150–400)
RBC: 2.27 MIL/uL — ABNORMAL LOW (ref 3.87–5.11)
RDW: 14.7 % (ref 11.5–15.5)
WBC Count: 6 10*3/uL (ref 4.0–10.5)
nRBC: 0 % (ref 0.0–0.2)

## 2023-04-09 MED ORDER — LIDOCAINE HCL 2 % IJ SOLN
INTRAMUSCULAR | Status: AC
  Filled 2023-04-09: qty 20

## 2023-04-09 NOTE — Patient Instructions (Signed)
Bone Marrow Aspiration and Bone Marrow Biopsy, Adult, Care After The following information offers guidance on how to care for yourself after your procedure. Your health care provider may also give you more specific instructions. If you have problems or questions, contact your health care provider. What can I expect after the procedure? After the procedure, it is common to have: Mild pain and tenderness. Swelling. Bruising. Follow these instructions at home: Incision care  Follow instructions from your health care provider about how to take care of the incision site. Make sure you: Wash your hands with soap and water for at least 20 seconds before and after you change your bandage (dressing). If soap and water are not available, use hand sanitizer. Change your dressing as told by your health care provider. Leave stitches (sutures), skin glue, or adhesive strips in place. These skin closures may need to stay in place for 2 weeks or longer. If adhesive strip edges start to loosen and curl up, you may trim the loose edges. Do not remove adhesive strips completely unless your health care provider tells you to do that. Check your incision site every day for signs of infection. Check for: More redness, swelling, or pain. Fluid or blood. Warmth. Pus or a bad smell. Activity Return to your normal activities as told by your health care provider. Ask your health care provider what activities are safe for you. Do not lift anything that is heavier than 10 lb (4.5 kg), or the limit that you are told, until your health care provider says that it is safe. If you were given a sedative during the procedure, it can affect you for several hours. Do not drive or operate machinery until your health care provider says that it is safe. General instructions  Take over-the-counter and prescription medicines only as told by your health care provider. Do not take baths, swim, or use a hot tub until your health care  provider approves. Ask your health care provider if you may take showers. You may only be allowed to take sponge baths. If directed, put ice on the affected area. To do this: Put ice in a plastic bag. Place a towel between your skin and the bag. Leave the ice on for 20 minutes, 2-3 times a day. If your skin turns bright red, remove the ice right away to prevent skin damage. The risk of skin damage is higher if you cannot feel pain, heat, or cold. Contact a health care provider if: You have signs of infection. Your pain is not controlled with medicine. You have cancer, and a temperature of 100.4F (38C) or higher. Get help right away if: You have a temperature of 101F (38.3C) or higher, or as told by your health care provider. You have bleeding from the incision site that cannot be controlled. This information is not intended to replace advice given to you by your health care provider. Make sure you discuss any questions you have with your health care provider. Document Revised: 08/21/2021 Document Reviewed: 08/21/2021 Elsevier Patient Education  2024 [employer]  

## 2023-04-09 NOTE — Progress Notes (Signed)
[facility] BONE MARROW BIOPSY/ASPIRATE PROGRESS NOTE  [name] [name] presents for Bone Marrow biopsy per MD orders. [name] [name] verbalized understanding of procedure. Consent reviewed and signed.  [name] [name] positioned supine for procedure. Pressure dressing applied to th left hip with instructions to leave in place for 24 hours. Patient instructed to report any bleeding that saturates dressing and to take pain medication as directed. Denies pain. VSS at time of discharge. CBC obtained prior to d/c. Dressing dry and intact to the left hip on discharge.

## 2023-04-09 NOTE — Progress Notes (Signed)
Bone Marrow Biopsy and Aspiration Procedure Note   Informed consent was obtained and potential risks including bleeding, infection and pain were reviewed with the patient. The patient's name, date of birth, identification, consent and allergies were verified prior to the start of procedure and time out was performed.  The left posterior iliac crest was chosen as the site of biopsy.  The skin was prepped with Betadine solution.   5 cc of 1% lidocaine was used to provide local anaesthesia.   10 cc of bone marrow aspirate was obtained followed by 1 inch biopsy.   The procedure was tolerated well and there were no complications.  The patient was stable at the end of the procedure.  Specimens sent for flow cytometry, cytogenetics and additional studies.

## 2023-04-12 LAB — SURGICAL PATHOLOGY

## 2023-04-12 NOTE — Progress Notes (Signed)
Sent email to [facility] pathology to add Next Gen sequencing to rule out MDS. Surgical Pathology ,CASE: [id number].

## 2023-04-17 ENCOUNTER — Encounter (HOSPITAL_COMMUNITY): Payer: Self-pay | Admitting: Hematology and Oncology

## 2023-04-18 DIAGNOSIS — H35363 Drusen (degenerative) of macula, bilateral: Secondary | ICD-10-CM | POA: Diagnosis not present

## 2023-04-18 DIAGNOSIS — H35033 Hypertensive retinopathy, bilateral: Secondary | ICD-10-CM | POA: Diagnosis not present

## 2023-04-18 DIAGNOSIS — Z973 Presence of spectacles and contact lenses: Secondary | ICD-10-CM | POA: Diagnosis not present

## 2023-04-18 DIAGNOSIS — H524 Presbyopia: Secondary | ICD-10-CM | POA: Diagnosis not present

## 2023-04-18 DIAGNOSIS — H04123 Dry eye syndrome of bilateral lacrimal glands: Secondary | ICD-10-CM | POA: Diagnosis not present

## 2023-04-18 DIAGNOSIS — H353132 Nonexudative age-related macular degeneration, bilateral, intermediate dry stage: Secondary | ICD-10-CM | POA: Diagnosis not present

## 2023-04-19 ENCOUNTER — Inpatient Hospital Stay: Payer: Medicare PPO | Admitting: Hematology and Oncology

## 2023-04-19 ENCOUNTER — Encounter: Payer: Self-pay | Admitting: Hematology and Oncology

## 2023-04-19 VITALS — BP 115/59 | HR 73 | Temp 97.6°F | Resp 18 | Ht 61.0 in | Wt 113.4 lb

## 2023-04-19 DIAGNOSIS — D638 Anemia in other chronic diseases classified elsewhere: Secondary | ICD-10-CM | POA: Diagnosis not present

## 2023-04-19 DIAGNOSIS — Z880 Allergy status to penicillin: Secondary | ICD-10-CM | POA: Diagnosis not present

## 2023-04-19 DIAGNOSIS — C9210 Chronic myeloid leukemia, BCR/ABL-positive, not having achieved remission: Secondary | ICD-10-CM

## 2023-04-19 DIAGNOSIS — N1832 Chronic kidney disease, stage 3b: Secondary | ICD-10-CM

## 2023-04-19 DIAGNOSIS — Z79899 Other long term (current) drug therapy: Secondary | ICD-10-CM | POA: Diagnosis not present

## 2023-04-19 DIAGNOSIS — D631 Anemia in chronic kidney disease: Secondary | ICD-10-CM | POA: Diagnosis not present

## 2023-04-19 DIAGNOSIS — D539 Nutritional anemia, unspecified: Secondary | ICD-10-CM | POA: Diagnosis not present

## 2023-04-19 NOTE — Progress Notes (Signed)
[facility] OFFICE PROGRESS NOTE  Patient Care Team: Pincus Sanes, MD as PCP - General (Internal Medicine) Rollene Rotunda, MD as PCP - Cardiology (Cardiology) Ollen Gross, MD as Consulting Physician (Orthopedic Surgery) Artis Delay, MD as Consulting Physician (Hematology and Oncology) Mateo Flow, MD as Consulting Physician (Ophthalmology)  ASSESSMENT & PLAN:  Chronic myeloid leukemia [facility]) Recent bone marrow biopsy showed no evidence of CML She will continue imatinib  Anemia, chronic disease Bone marrow biopsy showed no evidence of malignancy.  MDS panel is still pending but cytogenetics are negative We discussed risk and benefits of erythropoietin stimulating agent for treatment of anemia chronic kidney disease This is likely anemia of chronic disease. The patient denies recent history of bleeding such as epistaxis, hematuria or hematochezia. She is symptomatic from the anemia.  We discussed the risks, benefits, side effects of erythropoietin stimulating agents for anemia, with the goal of keeping the hemoglobin greater than 10 g. I discussed with the patient and potential side effects such as risk of thrombosis, severe hypertension, risk of congestive heart failure and stroke and she agreed to proceed. I will start initial dosing at 300 mcg every 3 weeks and we will reassess her response rates after 3 treatments to assess whether dosage adjustment is needed.  I will see her next month to review final test results before we start her on her first dose of injection   Orders Placed This Encounter  Procedures   CBC with Differential/Platelet    Standing Status:   Standing    Number of Occurrences:   22    Expiration Date:   04/18/2024    All questions were answered. The patient knows to call the clinic with any problems, questions or concerns. The total time spent in the appointment was 30 minutes encounter with patients including review of chart and various  tests results, discussions about plan of care and coordination of care plan   Artis Delay, MD 04/19/2023 2:28 PM  INTERVAL HISTORY: Please see below for problem oriented charting. she returns for review test results She is not symptomatic from anemia and had no ill effects from recent bone marrow biopsy We discussed risk and benefits of darbepoetin injection  REVIEW OF SYSTEMS:   Constitutional: Denies fevers, chills or abnormal weight loss Eyes: Denies blurriness of vision Ears, nose, mouth, throat, and face: Denies mucositis or sore throat Respiratory: Denies cough, dyspnea or wheezes Cardiovascular: Denies palpitation, chest discomfort or lower extremity swelling Gastrointestinal:  Denies nausea, heartburn or change in bowel habits Skin: Denies abnormal skin rashes Lymphatics: Denies new lymphadenopathy or easy bruising Neurological:Denies numbness, tingling or new weaknesses Behavioral/Psych: Mood is stable, no new changes  All other systems were reviewed with the patient and are negative.  I have reviewed the past medical history, past surgical history, social history and family history with the patient and they are unchanged from previous note.  ALLERGIES:  is allergic to penicillins.  MEDICATIONS:  Current Outpatient Medications  Medication Sig Dispense Refill   cholecalciferol (VITAMIN D3) 25 MCG (1000 UT) tablet Take 1,000 Units by mouth daily.     Cyanocobalamin (B-12) 5000 MCG CAPS Take 1 tablet by mouth daily.     estradiol (ESTRACE) 0.1 MG/GM vaginal cream INSERT 1 APPLICATORFUL VAGINALLY 2 TIMES WEEKLY 42.5 g 0   FINACEA 15 % cream Apply 1 application topically daily.  2   folic acid (FOLVITE) 1 MG tablet TAKE 1 TABLET BY MOUTH EVERY DAY 30 tablet 6  Glucosamine-Chondroit-Vit C-Mn (GLUCOSAMINE CHONDR 1500 COMPLX) CAPS Take 1 capsule by mouth daily.     imatinib (GLEEVEC) 100 MG tablet TAKE 3 TABLETS BY MOUTH EVERY DAY WITH A MEAL AND LARGE GLASS OF WATER 270 tablet  11   Loperamide HCl (IMODIUM PO) Take 0.5-1 tablets by mouth 2 (two) times daily as needed (takes 0.5 tablet every morning and 2nd dose if needed for diarrhea).      Multiple Vitamin (MULTIVITAMIN) tablet Take 1 tablet by mouth daily.     Multiple Vitamins-Minerals (PRESERVISION AREDS 2) CAPS Take 1 capsule by mouth 2 (two) times a day.     Probiotic Product (ALIGN) 4 MG CAPS Take 1 capsule by mouth every other day.      No current facility-administered medications for this visit.    SUMMARY OF ONCOLOGIC HISTORY: Oncology History  Chronic myeloid leukemia (HCC)  02/18/2007 Initial Diagnosis   Chronic myeloid leukemia   08/20/2014 Tumor Marker   BCR/ABL is undetectable   03/04/2015 Pathology Results   BCR/ABL is undetectable   08/29/2015 Tumor Marker   BCR/ABL is undetectable   02/29/2016 Pathology Results   BCR/ABL is undetectable   05/01/2016 Pathology Results   BCR/ABL b2a2 is detected at 0.03% and IS ratio 0.069%   05/31/2016 Pathology Results   BCR/ABL b2a2 is detected at 0.12% and IS ratio 0.276%   06/19/2016 Pathology Results   BCR/ABL b2a2 is detected at 0.24% and IS ratio 0.552%   06/19/2016 Miscellaneous   ABL mutation kinase testing is negative   06/27/2016 Miscellaneous   Dose of Gleevec is increased to 600 mg daily   07/25/2016 Pathology Results   BCR/ABL b2a2 is detected at 0.03% and IS ratio 0.069%. She has achieved MMR   08/30/2016 Miscellaneous   Dose of Gleevec is reduced to 300 mg daily and subsequently down to 200 mg daily   10/22/2016 Pathology Results   BCR/ABL b2a2 is not detected. She has achieved MMR   01/03/2017 Pathology Results   BCR/ABL b2a2 is detected IS ratio 0.041%   02/18/2017 Pathology Results   BCR/ABL b2a2 is detected IS ratio 0.0126   05/21/2017 Pathology Results   BCR/ABL b2a2 is not detected. She has achieved MMR   08/20/2017 Pathology Results   BCR/ABL b2a2 is not detected. She has achieved MMR   11/12/2017 Pathology Results   BCR/ABL  b2a2 is not detected   04/07/2018 Pathology Results   BCR/ABL b2a2 is detected IS ratio 0.0062. She is in MMR   06/27/2018 Pathology Results   BCR/ABL b2a2 is detected IS ratio 0.0097%. She is in MMR   08/29/2018 Pathology Results   BCR/ABL is undetectable. She is in MMR   12/26/2018 Pathology Results   BCR/ABL b2a2 is detected IS ratio 0.0042%. She is in MMR   05/04/2019 Pathology Results   BCR/ABL b2a2 is detected IS ratio 0.0575%. She is in MMR   08/03/2019 Pathology Results   BCR/ABL b2a2 is detected IS ratio 0.0167%. She is in MMR   02/01/2020 Pathology Results   BCR/ABL is undetectable   08/01/2020 Pathology Results   BCR/ABL is undetectable   02/28/2021 Pathology Results   BCR/ABL is undetectable   09/04/2021 Pathology Results   BCR/ABL is undetectable   02/28/2022 Pathology Results   BCR/ABL is undetectable    08/31/2022 Pathology Results   BCR/ABL is undetectable    03/04/2023 Pathology Results   BCR/ABL is undetectable    04/09/2023 Bone Marrow Biopsy   Surgical Pathology  CASE: [phone]  DIAGNOSIS:  BONE MARROW, ASPIRATE, CLOT, CORE: -  Essentially normocellular bone marrow with orderly trilineage hematopoiesis, no morphologic evidence of the patient's known chronic myeloid leukemia  PERIPHERAL BLOOD: -  Macrocytic anemia and thrombocytopenia  Note: Clinical history of both CML and early breast cancer is noted. The peripheral blood does show some subtle abnormal nuclear clumping to the neutrophilic lineage; however, this is insufficient for morphologic diagnosis of dysplasia.  The aspirate smear slides show no morphologic evidence of dysplasia.  The biopsy and clot section are essentially normocellular.  There is no morphologic evidence of the patient's known CML (CML and morphologic remission).  The clinical history of breast cancer is noted, and a keratin stain is negative for malignancy.  A morphologic etiology for the patient's progressive's pancytopenia is  not identified.  If clinically indicated material is reserved for NexGen sequencing as clinically indicated.  Clinical correlation recommended.      PHYSICAL EXAMINATION: ECOG PERFORMANCE STATUS: 1 - Symptomatic but completely ambulatory  Vitals:   04/19/23 1408  BP: (!) 115/59  Pulse: 73  Resp: 18  Temp: 97.6 F (36.4 C)  SpO2: 100%   Filed Weights   04/19/23 1408  Weight: 113 lb 6.4 oz (51.4 kg)    GENERAL:alert, no distress and comfortable NEURO: alert & oriented x 3 with fluent speech, no focal motor/sensory deficits  LABORATORY DATA:  I have reviewed the data as listed    Component Value Date/Time   NA 138 03/04/2023 1149   NA 140 02/18/2017 0808   K 4.8 03/04/2023 1149   K 4.4 02/18/2017 0808   CL 104 03/04/2023 1149   CL 105 09/04/2012 1006   CO2 31 03/04/2023 1149   CO2 29 02/18/2017 0808   GLUCOSE 105 (H) 03/04/2023 1149   GLUCOSE 86 02/18/2017 0808   GLUCOSE 96 09/04/2012 1006   BUN 31 (H) 03/04/2023 1149   BUN 20.3 02/18/2017 0808   CREATININE 1.39 (H) 03/04/2023 1149   CREATININE 0.93 05/04/2019 1108   CREATININE 1.1 02/18/2017 0808   CALCIUM 9.6 03/04/2023 1149   CALCIUM 9.4 02/18/2017 0808   PROT 6.7 03/04/2023 1149   PROT 6.6 02/18/2017 0808   ALBUMIN 3.9 03/04/2023 1149   ALBUMIN 4.2 02/18/2017 0808   AST 33 03/04/2023 1149   AST 34 05/04/2019 1108   AST 34 02/18/2017 0808   ALT 17 03/04/2023 1149   ALT 19 05/04/2019 1108   ALT 18 02/18/2017 0808   ALKPHOS 71 03/04/2023 1149   ALKPHOS 53 02/18/2017 0808   BILITOT 0.5 03/04/2023 1149   BILITOT 0.9 05/04/2019 1108   BILITOT 0.76 02/18/2017 0808   GFRNONAA 37 (L) 03/04/2023 1149   GFRNONAA 57 (L) 05/04/2019 1108   GFRAA 45 (L) 02/01/2020 1023   GFRAA >60 05/04/2019 1108    No results found for: "SPEP", "UPEP"  Lab Results  Component Value Date   WBC 6.0 04/09/2023   NEUTROABS 4.3 04/09/2023   HGB 8.7 (L) 04/09/2023   HCT 25.9 (L) 04/09/2023   MCV 114.1 (H) 04/09/2023   PLT 121  (L) 04/09/2023      Chemistry      Component Value Date/Time   NA 138 03/04/2023 1149   NA 140 02/18/2017 0808   K 4.8 03/04/2023 1149   K 4.4 02/18/2017 0808   CL 104 03/04/2023 1149   CL 105 09/04/2012 1006   CO2 31 03/04/2023 1149   CO2 29 02/18/2017 0808   BUN 31 (H) 03/04/2023 1149   BUN  20.3 02/18/2017 0808   CREATININE 1.39 (H) 03/04/2023 1149   CREATININE 0.93 05/04/2019 1108   CREATININE 1.1 02/18/2017 0808      Component Value Date/Time   CALCIUM 9.6 03/04/2023 1149   CALCIUM 9.4 02/18/2017 0808   ALKPHOS 71 03/04/2023 1149   ALKPHOS 53 02/18/2017 0808   AST 33 03/04/2023 1149   AST 34 05/04/2019 1108   AST 34 02/18/2017 0808   ALT 17 03/04/2023 1149   ALT 19 05/04/2019 1108   ALT 18 02/18/2017 0808   BILITOT 0.5 03/04/2023 1149   BILITOT 0.9 05/04/2019 1108   BILITOT 0.76 02/18/2017 1610

## 2023-04-19 NOTE — Assessment & Plan Note (Signed)
Bone marrow biopsy showed no evidence of malignancy.  MDS panel is still pending but cytogenetics are negative We discussed risk and benefits of erythropoietin stimulating agent for treatment of anemia chronic kidney disease This is likely anemia of chronic disease. The patient denies recent history of bleeding such as epistaxis, hematuria or hematochezia. She is symptomatic from the anemia.  We discussed the risks, benefits, side effects of erythropoietin stimulating agents for anemia, with the goal of keeping the hemoglobin greater than 10 g. I discussed with the patient and potential side effects such as risk of thrombosis, severe hypertension, risk of congestive heart failure and stroke and she agreed to proceed. I will start initial dosing at 300 mcg every 3 weeks and we will reassess her response rates after 3 treatments to assess whether dosage adjustment is needed.  I will see her next month to review final test results before we start her on her first dose of injection

## 2023-04-19 NOTE — Assessment & Plan Note (Signed)
Recent bone marrow biopsy showed no evidence of CML She will continue imatinib

## 2023-04-25 ENCOUNTER — Encounter (HOSPITAL_COMMUNITY): Payer: Self-pay | Admitting: Hematology and Oncology

## 2023-05-06 ENCOUNTER — Other Ambulatory Visit: Payer: Self-pay | Admitting: Hematology and Oncology

## 2023-05-09 ENCOUNTER — Inpatient Hospital Stay: Payer: Medicare PPO | Admitting: Hematology and Oncology

## 2023-05-09 ENCOUNTER — Inpatient Hospital Stay: Payer: Medicare PPO

## 2023-05-09 ENCOUNTER — Encounter: Payer: Self-pay | Admitting: Hematology and Oncology

## 2023-05-09 ENCOUNTER — Inpatient Hospital Stay: Payer: Medicare PPO | Attending: Hematology and Oncology

## 2023-05-09 VITALS — BP 123/69 | HR 75 | Temp 97.4°F | Resp 16 | Ht 61.0 in | Wt 117.4 lb

## 2023-05-09 DIAGNOSIS — C9210 Chronic myeloid leukemia, BCR/ABL-positive, not having achieved remission: Secondary | ICD-10-CM

## 2023-05-09 DIAGNOSIS — N1832 Chronic kidney disease, stage 3b: Secondary | ICD-10-CM

## 2023-05-09 DIAGNOSIS — D61810 Antineoplastic chemotherapy induced pancytopenia: Secondary | ICD-10-CM | POA: Diagnosis not present

## 2023-05-09 DIAGNOSIS — T451X5A Adverse effect of antineoplastic and immunosuppressive drugs, initial encounter: Secondary | ICD-10-CM

## 2023-05-09 LAB — CBC WITH DIFFERENTIAL/PLATELET
Abs Immature Granulocytes: 0.01 10*3/uL (ref 0.00–0.07)
Basophils Absolute: 0 10*3/uL (ref 0.0–0.1)
Basophils Relative: 1 %
Eosinophils Absolute: 0.1 10*3/uL (ref 0.0–0.5)
Eosinophils Relative: 2 %
HCT: 27.5 % — ABNORMAL LOW (ref 36.0–46.0)
Hemoglobin: 9.4 g/dL — ABNORMAL LOW (ref 12.0–15.0)
Immature Granulocytes: 0 %
Lymphocytes Relative: 30 %
Lymphs Abs: 1.3 10*3/uL (ref 0.7–4.0)
MCH: 37.3 pg — ABNORMAL HIGH (ref 26.0–34.0)
MCHC: 34.2 g/dL (ref 30.0–36.0)
MCV: 109.1 fL — ABNORMAL HIGH (ref 80.0–100.0)
Monocytes Absolute: 0.6 10*3/uL (ref 0.1–1.0)
Monocytes Relative: 14 %
Neutro Abs: 2.3 10*3/uL (ref 1.7–7.7)
Neutrophils Relative %: 53 %
Platelets: 133 10*3/uL — ABNORMAL LOW (ref 150–400)
RBC: 2.52 MIL/uL — ABNORMAL LOW (ref 3.87–5.11)
RDW: 13.8 % (ref 11.5–15.5)
WBC: 4.4 10*3/uL (ref 4.0–10.5)
nRBC: 0 % (ref 0.0–0.2)

## 2023-05-09 NOTE — Assessment & Plan Note (Signed)
 Recent bone marrow biopsy showed no evidence of CML She will continue imatinib

## 2023-05-09 NOTE — Progress Notes (Signed)
 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   All questions were answered. The patient knows to call the clinic with any problems, questions or concerns. The total time spent in the appointment was 20 minutes encounter with patients including review of chart and various tests results, discussions about plan of care and coordination of care plan   Hicks Lonn, MD 05/09/2023 3:37 PM  INTERVAL HISTORY: Please see below for problem oriented charting. she returns for surveillance follow-up and treatment She is here accompanied by her husband She is doing well She is not symptomatic from anemia We discussed CBC results and risk and benefits of ESA  REVIEW OF SYSTEMS:   Constitutional: Denies fevers, chills or abnormal weight loss Eyes: Denies blurriness of vision Ears, nose, mouth, throat, and face: Denies mucositis or sore throat Respiratory: Denies cough, dyspnea or wheezes Cardiovascular: Denies palpitation,  chest discomfort or lower extremity swelling Gastrointestinal:  Denies nausea, heartburn or change in bowel habits Skin: Denies abnormal skin rashes Lymphatics: Denies new lymphadenopathy or easy bruising Neurological:Denies numbness, tingling or new weaknesses Behavioral/Psych: Mood is stable, no new changes  All other systems were reviewed with the patient and are negative.  I have reviewed the past medical history, past surgical history, social history and family history with the patient and they are unchanged from previous note.  ALLERGIES:  is allergic to penicillins.  MEDICATIONS:  Current Outpatient Medications  Medication Sig Dispense Refill   cholecalciferol (VITAMIN D3) 25 MCG (1000 UT) tablet Take 1,000 Units by mouth daily.     Cyanocobalamin  (B-12) 5000 MCG CAPS Take 1 tablet by mouth daily.     estradiol  (ESTRACE ) 0.1 MG/GM vaginal cream INSERT 1 APPLICATORFUL VAGINALLY 2 TIMES WEEKLY 42.5 g 0   FINACEA  15 % cream Apply 1 application topically daily.  2   folic acid  (FOLVITE ) 1 MG tablet TAKE 1 TABLET BY MOUTH EVERY DAY 30 tablet 6   Glucosamine-Chondroit-Vit C-Mn (GLUCOSAMINE CHONDR 1500 COMPLX) CAPS Take 1 capsule by mouth daily.     imatinib  (GLEEVEC ) 100 MG tablet TAKE 3 TABLETS BY MOUTH EVERY DAY WITH A MEAL AND LARGE GLASS OF WATER  270 tablet 11   Loperamide HCl (IMODIUM PO) Take 0.5-1 tablets by mouth 2 (two) times daily as needed (takes 0.5 tablet every morning and 2nd dose if needed for diarrhea).      Multiple Vitamin (MULTIVITAMIN) tablet Take 1 tablet by mouth daily.     Multiple Vitamins-Minerals (PRESERVISION AREDS 2) CAPS Take 1 capsule by mouth 2 (two) times a day.     Probiotic Product (ALIGN) 4 MG CAPS Take 1  capsule by mouth every other day.      No current facility-administered medications for this visit.    SUMMARY OF ONCOLOGIC HISTORY: Oncology History Overview Note  Repeat Bone marrow biopsy in Dec 2024 neg for malignancy and neg for NGS    Chronic myeloid leukemia (HCC)  02/18/2007 Initial Diagnosis   Chronic myeloid leukemia   08/20/2014 Tumor Marker   BCR/ABL is undetectable   03/04/2015 Pathology Results   BCR/ABL is undetectable   08/29/2015 Tumor Marker   BCR/ABL is undetectable   02/29/2016 Pathology Results   BCR/ABL is undetectable   05/01/2016 Pathology Results   BCR/ABL b2a2 is detected at 0.03% and IS ratio 0.069%   05/31/2016 Pathology Results   BCR/ABL b2a2 is detected at 0.12% and IS ratio 0.276%   06/19/2016 Pathology Results   BCR/ABL b2a2 is detected at 0.24% and IS ratio 0.552%   06/19/2016 Miscellaneous   ABL mutation kinase testing is negative   06/27/2016 Miscellaneous   Dose of Gleevec  is increased to 600 mg daily   07/25/2016 Pathology Results   BCR/ABL b2a2 is detected at 0.03% and IS ratio 0.069%. She has achieved MMR   08/30/2016 Miscellaneous   Dose of Gleevec  is reduced to 300 mg daily and subsequently down to 200 mg daily   10/22/2016 Pathology Results   BCR/ABL b2a2 is not detected. She has achieved MMR   01/03/2017 Pathology Results   BCR/ABL b2a2 is detected IS ratio 0.041%   02/18/2017 Pathology Results   BCR/ABL b2a2 is detected IS ratio 0.0126   05/21/2017 Pathology Results   BCR/ABL b2a2 is not detected. She has achieved MMR   08/20/2017 Pathology Results   BCR/ABL b2a2 is not detected. She has achieved MMR   11/12/2017 Pathology Results   BCR/ABL b2a2 is not detected   04/07/2018 Pathology Results   BCR/ABL b2a2 is detected IS ratio 0.0062. She is in MMR   06/27/2018 Pathology Results   BCR/ABL b2a2 is detected IS ratio 0.0097%. She is in MMR   08/29/2018 Pathology Results   BCR/ABL is undetectable. She is in MMR   12/26/2018 Pathology Results   BCR/ABL b2a2 is detected IS ratio 0.0042%. She is in MMR   05/04/2019 Pathology Results   BCR/ABL b2a2 is detected IS ratio 0.0575%. She is in MMR   08/03/2019 Pathology Results   BCR/ABL b2a2 is detected IS ratio 0.0167%. She is  in MMR   02/01/2020 Pathology Results   BCR/ABL is undetectable   08/01/2020 Pathology Results   BCR/ABL is undetectable   02/28/2021 Pathology Results   BCR/ABL is undetectable   09/04/2021 Pathology Results   BCR/ABL is undetectable   02/28/2022 Pathology Results   BCR/ABL is undetectable    08/31/2022 Pathology Results   BCR/ABL is undetectable    03/04/2023 Pathology Results   BCR/ABL is undetectable    04/09/2023 Bone Marrow Biopsy   Surgical Pathology  CASE: [phone]   DIAGNOSIS:  BONE MARROW, ASPIRATE, CLOT, CORE: -  Essentially normocellular bone marrow with orderly trilineage hematopoiesis, no morphologic evidence of the patient's known chronic myeloid leukemia  PERIPHERAL BLOOD: -  Macrocytic anemia and thrombocytopenia  Note: Clinical history of both CML and early breast cancer is noted. The peripheral blood does show some subtle abnormal nuclear clumping to the neutrophilic lineage; however, this is insufficient for morphologic diagnosis of dysplasia.  The aspirate smear slides show no morphologic evidence of dysplasia.  The biopsy and clot section are essentially normocellular.  There is no morphologic evidence  of the patient's known CML (CML and morphologic remission).  The clinical history of breast cancer is noted, and a keratin stain is negative for malignancy.  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  If clinically indicated material is reserved for NexGen sequencing as clinically indicated.  Clinical correlation recommended.      PHYSICAL EXAMINATION: ECOG PERFORMANCE STATUS: 0 - Asymptomatic  Vitals:   05/09/23 1320  BP: 123/69  Pulse: 75  Resp: 16  Temp: (!) 97.4 F (36.3 C)  SpO2: 100%   Filed Weights   05/09/23 1320  Weight: 117 lb 6.4 oz (53.3 kg)    GENERAL:alert, no distress and comfortable NEURO: alert & oriented x 3 with fluent speech, no focal motor/sensory deficits  LABORATORY DATA:   I have reviewed the data as listed    Component Value Date/Time   NA 138 03/04/2023 1149   NA 140 02/18/2017 0808   K 4.8 03/04/2023 1149   K 4.4 02/18/2017 0808   CL 104 03/04/2023 1149   CL 105 09/04/2012 1006   CO2 31 03/04/2023 1149   CO2 29 02/18/2017 0808   GLUCOSE 105 (H) 03/04/2023 1149   GLUCOSE 86 02/18/2017 0808   GLUCOSE 96 09/04/2012 1006   BUN 31 (H) 03/04/2023 1149   BUN 20.3 02/18/2017 0808   CREATININE 1.39 (H) 03/04/2023 1149   CREATININE 0.93 05/04/2019 1108   CREATININE 1.1 02/18/2017 0808   CALCIUM 9.6 03/04/2023 1149   CALCIUM 9.4 02/18/2017 0808   PROT 6.7 03/04/2023 1149   PROT 6.6 02/18/2017 0808   ALBUMIN 3.9 03/04/2023 1149   ALBUMIN 4.2 02/18/2017 0808   AST 33 03/04/2023 1149   AST 34 05/04/2019 1108   AST 34 02/18/2017 0808   ALT 17 03/04/2023 1149   ALT 19 05/04/2019 1108   ALT 18 02/18/2017 0808   ALKPHOS 71 03/04/2023 1149   ALKPHOS 53 02/18/2017 0808   BILITOT 0.5 03/04/2023 1149   BILITOT 0.9 05/04/2019 1108   BILITOT 0.76 02/18/2017 0808   GFRNONAA 37 (L) 03/04/2023 1149   GFRNONAA 57 (L) 05/04/2019 1108   GFRAA 45 (L) 02/01/2020 1023   GFRAA >60 05/04/2019 1108    No results found for: SPEP, UPEP  Lab Results  Component Value Date   WBC 4.4 05/09/2023   NEUTROABS 2.3 05/09/2023   HGB 9.4 (L) 05/09/2023   HCT 27.5 (L) 05/09/2023   MCV 109.1 (H) 05/09/2023   PLT 133 (L) 05/09/2023      Chemistry      Component Value Date/Time   NA 138 03/04/2023 1149   NA 140 02/18/2017 0808   K 4.8 03/04/2023 1149   K 4.4 02/18/2017 0808   CL 104 03/04/2023 1149   CL 105 09/04/2012 1006   CO2 31 03/04/2023 1149   CO2 29 02/18/2017 0808   BUN 31 (H) 03/04/2023 1149   BUN 20.3 02/18/2017 0808   CREATININE 1.39 (H) 03/04/2023 1149   CREATININE 0.93 05/04/2019 1108   CREATININE 1.1 02/18/2017 0808      Component Value Date/Time   CALCIUM 9.6 03/04/2023 1149   CALCIUM 9.4 02/18/2017 0808   ALKPHOS 71 03/04/2023 1149    ALKPHOS 53 02/18/2017 0808   AST 33 03/04/2023 1149   AST 34 05/04/2019 1108   AST 34 02/18/2017 0808   ALT 17 03/04/2023 1149   ALT 19 05/04/2019 1108   ALT 18 02/18/2017 0808   BILITOT 0.5 03/04/2023 1149   BILITOT 0.9 05/04/2019  1108   BILITOT 0.76 02/18/2017 9191

## 2023-05-09 NOTE — Assessment & Plan Note (Signed)
 Her recent bone marrow biopsy showed no evidence of MDS or cancer infiltration Her blood count actually improved today without erythropoietin  stimulating agent We discussed risk and benefits of erythropoietin  stimulating agent and ultimately, we are in agreement to hold off as the patient is not symptomatic She will return next month for further follow-up We will proceed with ESA if her hemoglobin is less than 9

## 2023-05-31 ENCOUNTER — Ambulatory Visit: Payer: Medicare PPO

## 2023-05-31 VITALS — BP 120/70 | HR 74 | Ht 61.0 in

## 2023-05-31 DIAGNOSIS — Z780 Asymptomatic menopausal state: Secondary | ICD-10-CM

## 2023-05-31 DIAGNOSIS — Z0000 Encounter for general adult medical examination without abnormal findings: Secondary | ICD-10-CM

## 2023-05-31 NOTE — Patient Instructions (Addendum)
[name] [name] , Thank you for taking time to come for your Medicare Wellness Visit. I appreciate your ongoing commitment to your health goals. Please review the following plan we discussed and let me know if I can assist you in the future.   Referrals/Orders/Follow-Ups/Clinician Recommendations: It was nice to meet you today.  Each day, aim for 6 glasses of water, plenty of protein in your diet and try to get up and walk/ stretch every hour for 5-10 minutes at a time.  You have an order for:   [x]   Bone Density     Please call for appointment:  [facility] [address] [place] [address] [place], [place] [address] [phone]   Make sure to wear two-piece clothing.  No lotions, powders, or deodorants the day of the appointment. Make sure to bring picture ID and insurance card.  Bring list of medications you are currently taking including any supplements.   Schedule your [place] screening mammogram through MyChart!   Log into your MyChart account.  Go to 'Visit' (or 'Appointments' if on mobile App) --> Schedule an Appointment  Under 'Select a Reason for Visit' choose the Mammogram Screening option.  Complete the pre-visit questions and select the time and place that best fits your schedule.    This is a list of the screening recommended for you and due dates:  Health Maintenance  Topic Date Due   Zoster (Shingles) Vaccine (1 of 2) 06[birth year]29/1955   DTaP/Tdap/Td vaccine (2 - Tdap) 04/30/2016   DEXA scan (bone density measurement)  02/19/2020   COVID-19 Vaccine (7 - 2024-25 season) 12/30/2022   Medicare Annual Wellness Visit  05/30/2024   Pneumonia Vaccine  Completed   Flu Shot  Completed   HPV Vaccine  Aged Out    Advanced directives: (Copy Requested) Please bring a copy of your health care power of attorney and living will to the office to be added to your chart at your convenience.  Next Medicare Annual Wellness Visit scheduled for next year: Yes

## 2023-05-31 NOTE — Progress Notes (Signed)
Subjective:   [name] [name] is a 89 y.o. female who presents for Medicare Annual (Subsequent) preventive examination.  Visit Complete: In person   Cardiac Risk Factors include: advanced age (>50men, >7 women);Other (see comment), Risk factor comments: CKD     Objective:    There were no vitals filed for this visit. There is no height or weight on file to calculate BMI.     05/31/2023   11:43 AM 06/15/2022    1:30 PM 06/07/2021    3:42 PM 04/27/2020    1:46 PM 03/31/2019    1:30 PM 03/02/2019    5:00 PM 03/02/2019    4:59 PM  Advanced Directives  Does Patient Have a Medical Advance Directive? Yes Yes Yes Yes Yes  Yes  Type of Estate agent of [place];Living will Healthcare Power of [place];Living will Living will;Healthcare Power of [employer] Power of [place];Living will Living will   Does patient want to make changes to medical advance directive?   No - Patient declined No - Patient declined  No - Patient declined No - Patient declined  Copy of Healthcare Power of Attorney in Chart? No - copy requested No - copy requested No - copy requested  No - copy requested      Current Medications (verified) Outpatient Encounter Medications as of 05/31/2023  Medication Sig   cholecalciferol (VITAMIN D3) 25 MCG (1000 UT) tablet Take 1,000 Units by mouth daily.   Cyanocobalamin (B-12) 5000 MCG CAPS Take 1 tablet by mouth daily.   estradiol (ESTRACE) 0.1 MG/GM vaginal cream INSERT 1 APPLICATORFUL VAGINALLY 2 TIMES WEEKLY   FINACEA 15 % cream Apply 1 application topically daily.   folic acid (FOLVITE) 1 MG tablet TAKE 1 TABLET BY MOUTH EVERY DAY   Glucosamine-Chondroit-Vit C-Mn (GLUCOSAMINE CHONDR 1500 COMPLX) CAPS Take 1 capsule by mouth daily.   imatinib (GLEEVEC) 100 MG tablet TAKE 3 TABLETS BY MOUTH EVERY DAY WITH A MEAL AND LARGE GLASS OF WATER   Loperamide HCl (IMODIUM PO) Take 0.5-1 tablets by mouth 2 (two) times daily as needed (takes 0.5 tablet  every morning and 2nd dose if needed for diarrhea).    Multiple Vitamin (MULTIVITAMIN) tablet Take 1 tablet by mouth daily.   Multiple Vitamins-Minerals (PRESERVISION AREDS 2) CAPS Take 1 capsule by mouth 2 (two) times a day.   Probiotic Product (ALIGN) 4 MG CAPS Take 1 capsule by mouth every other day.    No facility-administered encounter medications on file as of 05/31/2023.    Allergies (verified) Penicillins   History: Past Medical History:  Diagnosis Date   Anemia requiring transfusions 2010 & 2015   Arthritis    DJD   Breast cancer (HCC) 1993   S/P lumpectomy , radiation, & Tamoxifen   CML (chronic myeloid leukemia) (HCC)    Dr Bertis Ruddy;   SVT (supraventricular tachycardia) [facility])    dx june 2020 , magd by Dr Antoine Poche ; reports occasional heart flutter   Past Surgical History:  Procedure Laterality Date   ABDOMINAL HYSTERECTOMY  ~1989   no BSO, for Dysplasia    APPENDECTOMY  ~1989   BILATERAL SALPINGOOPHORECTOMY  2010   with bowel resection   BREAST SURGERY     Right lumpectomy, s/p radiation 1993 and oral  Tamoxifen x 5 years (1993-1998)    CATARACT EXTRACTION  06/08/2014   left eye;Dr Elmer Picker   CATARACT EXTRACTION, BILATERAL Bilateral 2015   COLONOSCOPY  03/2003, last 2010   diverticulosis, Dr.Kaplan   EXCISION  MORTON'S NEUROMA Left mid 80's   foot   gastrocslide     left leg; Dr Lestine Box   ileostomy reversal  2011   3 mos after above   KNEE SURGERY  mid 80's   Arthroscopy, right knee    LEG TENDON SURGERY Left 2013   "cut on leg and fixed something"   OPEN SURGICAL REPAIR OF GLUTEAL TENDON Left 10/24/2016   Procedure: Left hip bursectomy; gluteal tendon repair;  Surgeon: Ollen Gross, MD;  Location: [facility];  Service: Orthopedics;  Laterality: Left;   sigmoid colon resection  2010    Dr Edwyna Shell, [place] for fistula & diverticulitis   TENDON REPAIR Left 2019   left shoulder tendon repair - surgical center    TOTAL HIP ARTHROPLASTY Left 03/02/2019   Procedure:  TOTAL HIP ARTHROPLASTY ANTERIOR APPROACH;  Surgeon: Ollen Gross, MD;  Location: [facility];  Service: Orthopedics;  Laterality: Left;    TOTAL KNEE ARTHROPLASTY Right 06/11/2013   Procedure: RIGHT TOTAL KNEE ARTHROPLASTY;  Surgeon: Eugenia Mcalpine, MD;  Location: [facility];  Service: Orthopedics;  Laterality: Right;   TRIGGER FINGER RELEASE Left 2013   thumb   Family History  Problem Relation Age of Onset   Colon cancer Mother 67   Uterine cancer Sister    Diabetes Cousin    Cancer Father        Lip cancer   Aneurysm Father        AAA;S/P surgery   Breast cancer Paternal Aunt    Heart attack Paternal Uncle 53   Stroke Neg Hx    Esophageal cancer Neg Hx    Rectal cancer Neg Hx    Stomach cancer Neg Hx    Social History   Socioeconomic History   Marital status: Married    Spouse name: Windy Fast   Number of children: 2   Years of education: Not on file   Highest education level: Not on file  Occupational History   Occupation: retired Company secretary  Tobacco Use   Smoking status: Former    Current packs/day: 0.00    Types: Cigarettes    Start date: 04/30/1957    Quit date: 04/30/1962    Years since quitting: 61.1   Smokeless tobacco: Never   Tobacco comments:    smoked 1956-1964, up to 1/2 ppd  Vaping Use   Vaping status: Never Used  Substance and Sexual Activity   Alcohol use: Yes    Alcohol/week: 7.0 - 14.0 standard drinks of alcohol    Types: 7 - 14 Glasses of wine per week    Comment: 10-29 about 10 glasses of wine weekly    Drug use: No   Sexual activity: Yes  Other Topics Concern   Not on file  Social History Narrative   Married, lives wit husband at [employer]   Social Drivers of Health   Financial Resource Strain: Low Risk  (05/31/2023)   Overall Financial Resource Strain (CARDIA)    Difficulty of Paying Living Expenses: Not hard at all  Food Insecurity: No Food Insecurity (05/31/2023)   Hunger Vital Sign    Worried About Running Out of Food in the Last  Year: Never true    Ran Out of Food in the Last Year: Never true  Transportation Needs: No Transportation Needs (05/31/2023)   PRAPARE - Administrator, Civil Service (Medical): No    Lack of Transportation (Non-Medical): No  Physical Activity: Inactive (05/31/2023)   Exercise Vital Sign    Days  of Exercise per Week: 0 days    Minutes of Exercise per Session: 0 min  Stress: No Stress Concern Present (05/31/2023)   [employer] of Occupational Health - Occupational Stress Questionnaire    Feeling of Stress : Only a little  Social Connections: Moderately Isolated (05/31/2023)   Social Connection and Isolation Panel [NHANES]    Frequency of Communication with Friends and Family: Twice a week    Frequency of Social Gatherings with Friends and Family: Never    Attends Religious Services: More than 4 times per year    Active Member of [employer] or Organizations: No    Attends Banker Meetings: Never    Marital Status: Married    Tobacco Counseling Counseling given: Not Answered Tobacco comments: smoked 1956-1964, up to 1/2 ppd   Clinical Intake:  Pre-visit preparation completed: Yes  Pain : No/denies pain     Nutritional Risks: None, Nausea/ vomitting/ diarrhea (diarrhea) Diabetes: No  How often do you need to have someone help you when you read instructions, pamphlets, or other written materials from your doctor or pharmacy?: 1 - Never  Interpreter Needed?: No  Information entered by :: Finnigan Warriner, RMA   Activities of Daily Living    05/31/2023   11:32 AM 06/15/2022    1:31 PM  In your present state of health, do you have any difficulty performing the following activities:  Hearing? 1 0  Comment has noticed some issues   Vision? 0 0  Comment  macular degeneration  Difficulty concentrating or making decisions? 0 1  Walking or climbing stairs? 0 0  Dressing or bathing? 0 0  Doing errands, shopping? 0 0  Preparing Food and eating ? N N  Using  the Toilet? N N  In the past six months, have you accidently leaked urine? Y Y  Do you have problems with loss of bowel control? Y Y  Comment  due to diarrhea  Managing your Medications? N N  Managing your Finances? N N  Housekeeping or managing your Housekeeping? N N    Patient Care Team: Pincus Sanes, MD as PCP - General (Internal Medicine) Rollene Rotunda, MD as PCP - Cardiology (Cardiology) Ollen Gross, MD as Consulting Physician (Orthopedic Surgery) Artis Delay, MD as Consulting Physician (Hematology and Oncology) Mateo Flow, MD as Consulting Physician (Ophthalmology)  Indicate any recent Medical Services you may have received from other than [facility] providers in the past year (date may be approximate).     Assessment:   This is a routine wellness examination for [place].  Hearing/Vision screen Hearing Screening - Comments:: Has noticed some change Vision Screening - Comments:: Denies vision issues.    Goals Addressed   None   Depression Screen    05/31/2023   11:49 AM 12/13/2022    2:42 PM 06/15/2022    1:31 PM 05/25/2022    1:43 PM 06/07/2021    3:47 PM 04/27/2020    1:42 PM 03/31/2019    1:53 PM  PHQ 2/9 Scores  PHQ - 2 Score 1 0 0 0 0 0 0  PHQ- 9 Score 2   3       Fall Risk    05/31/2023   11:44 AM 01/24/2023   10:53 AM 12/13/2022    2:42 PM 06/15/2022    1:31 PM 05/25/2022    1:42 PM  Fall Risk   Falls in the past year? 0 0 0 0 0  Number falls in past yr: 0 0 0  0 0  Injury with Fall? 0 0 0 0 0  Risk for fall due to : No Fall Risks  No Fall Risks Medication side effect No Fall Risks  Follow up Falls prevention discussed;Falls evaluation completed Falls evaluation completed Falls evaluation completed Falls prevention discussed;Education provided;Falls evaluation completed Falls evaluation completed    MEDICARE RISK AT HOME: Medicare Risk at Home Any stairs in or around the home?: Yes If so, are there any without handrails?: Yes Home free of loose  throw rugs in walkways, pet beds, electrical cords, etc?: Yes Adequate lighting in your home to reduce risk of falls?: Yes Life alert?: Yes Use of a cane, walker or w/c?: No Grab bars in the bathroom?: Yes Shower chair or bench in shower?: No Elevated toilet seat or a handicapped toilet?: Yes  TIMED UP AND GO:  Was the test performed?  Yes  Length of time to ambulate 10 feet: 15 sec Gait slow and steady without use of assistive device    Cognitive Function:        05/31/2023   11:33 AM 06/15/2022    1:33 PM  6CIT Screen  What Year? 0 points 0 points  What month? 0 points 0 points  What time? 0 points 0 points  Count back from 20 0 points 0 points  Months in reverse 0 points 0 points  Repeat phrase 6 points 4 points  Total Score 6 points 4 points    Immunizations Immunization History  Administered Date(s) Administered   Fluad Quad(high Dose 65+) 02/06/2019, 02/01/2020, 02/11/2022   Fluad Trivalent(High Dose 65+) 01/24/2023   Influenza Split 02/17/2015   Influenza Whole 02/29/2012   Influenza, High Dose Seasonal PF 02/17/2016   Influenza,inj,Quad PF,6+ Mos 04/15/2018   Influenza,inj,quad, With Preservative 01/28/2017   Influenza-Unspecified 03/14/2013, 01/19/2014, 01/17/2017, 02/16/2021   PFIZER(Purple Top)SARS-COV-2 Vaccination 06/07/2019, 07/02/2019, 02/08/2020, 09/20/2020   Pfizer Covid-19 Vaccine Bivalent Booster 47yrs & up 03/30/2021, 01/07/2022   Pfizer(Comirnaty)Fall Seasonal Vaccine 12 years and older 04/16/2023   Pneumococcal Conjugate-13 03/01/2015   Pneumococcal Polysaccharide-23 03/30/2002, 09/29/2002, 03/11/2012   Td 04/30/2006   Zoster, Live 06/29/1986    TDAP status: Due, Education has been provided regarding the importance of this vaccine. Advised may receive this vaccine at local pharmacy or Health Dept. Aware to provide a copy of the vaccination record if obtained from local pharmacy or Health Dept. Verbalized acceptance and understanding.  Flu  Vaccine status: Up to date  Pneumococcal vaccine status: Up to date  Covid-19 vaccine status: Completed vaccines  Qualifies for Shingles Vaccine? Yes   Zostavax completed Yes   Shingrix Completed?: No.    Education has been provided regarding the importance of this vaccine. Patient has been advised to call insurance company to determine out of pocket expense if they have not yet received this vaccine. Advised may also receive vaccine at local pharmacy or Health Dept. Verbalized acceptance and understanding.  Screening Tests Health Maintenance  Topic Date Due   Zoster Vaccines- Shingrix (1 of 2) 06/29/1955   DTaP/Tdap/Td (2 - Tdap) 04/30/2016   DEXA SCAN  02/19/2020   COVID-19 Vaccine (8 - 2024-25 season) 06/11/2023   Medicare Annual Wellness (AWV)  05/30/2024   Pneumonia Vaccine 74+ Years old  Completed   INFLUENZA VACCINE  Completed   HPV VACCINES  Aged Out    Health Maintenance  Health Maintenance Due  Topic Date Due   Zoster Vaccines- Shingrix (1 of 2) 06/29/1955   DTaP/Tdap/Td (2 - Tdap) 04/30/2016   DEXA  SCAN  02/19/2020    Colorectal cancer screening: No longer required.   Mammogram status: Completed 07/04/2022. Repeat every year  Bone Density status: Ordered 05/31/23. Pt provided with contact info and advised to call to schedule appt.  Lung Cancer Screening: (Low Dose CT Chest recommended if Age 33-80 years, 20 pack-year currently smoking OR have quit w/in 15years.) does not qualify.   Lung Cancer Screening Referral: n/a  Additional Screening:  Hepatitis C Screening: does not qualify;   Vision Screening: Recommended annual ophthalmology exams for early detection of glaucoma and other disorders of the eye. Is the patient up to date with their annual eye exam?  Yes  Who is the provider or what is the name of the office in which the patient attends annual eye exams? [facility] If pt is not established with a provider, would they like to be referred to a provider to  establish care? No .   Dental Screening: Recommended annual dental exams for proper oral hygiene   Community Resource Referral / Chronic Care Management: CRR required this visit?  No   CCM required this visit?  No     Plan:     I have personally reviewed and noted the following in the patient's chart:   Medical and social history Use of alcohol, tobacco or illicit drugs  Current medications and supplements including opioid prescriptions. Patient is not currently taking opioid prescriptions. Functional ability and status Nutritional status Physical activity Advanced directives List of other physicians Hospitalizations, surgeries, and ER visits in previous 12 months Vitals Screenings to include cognitive, depression, and falls Referrals and appointments  In addition, I have reviewed and discussed with patient certain preventive protocols, quality metrics, and best practice recommendations. A written personalized care plan for preventive services as well as general preventive health recommendations were provided to patient.     Sunil Hue L Tomie Spizzirri, CMA   05/31/2023   After Visit Summary: (MyChart) Due to this being a telephonic visit, the after visit summary with patients personalized plan was offered to patient via MyChart   Nurse Notes: Patient is due for a DEXA and it has been ordered today.  She is already scheduled for a mammogram.  Patient stated that she received all her vaccines, however they are not documented in the [facility].  She also informed that she noticed her diarrhea has been more frequent since she started eating some bread with seeds in it.  The bread has over 20 different seeds in it.  She will stop eating seeds and the bread with the seeds in it to see if that will help with diarrhea.  Patient had no other concern to discuss today.

## 2023-06-03 ENCOUNTER — Encounter: Payer: Self-pay | Admitting: Internal Medicine

## 2023-06-03 ENCOUNTER — Telehealth: Payer: Self-pay

## 2023-06-03 NOTE — Progress Notes (Signed)
 Subjective:    Patient ID: [name] [name], female    DOB: 1934-06-17, 89 y.o.   MRN: [id number]      HPI [name] [name] is here for a Physical exam and her chronic medical problems.   No labs needed   Medications and allergies reviewed with patient and updated if appropriate.  Current Outpatient Medications on File Prior to Visit  Medication Sig Dispense Refill   cholecalciferol (VITAMIN D3) 25 MCG (1000 UT) tablet Take 1,000 Units by mouth daily.     Cyanocobalamin  (B-12) 5000 MCG CAPS Take 1 tablet by mouth daily.     estradiol  (ESTRACE ) 0.1 MG/GM vaginal cream INSERT 1 APPLICATORFUL VAGINALLY 2 TIMES WEEKLY 42.5 g 0   FINACEA  15 % cream Apply 1 application topically daily.  2   folic acid  (FOLVITE ) 1 MG tablet TAKE 1 TABLET BY MOUTH EVERY DAY 30 tablet 6   Glucosamine-Chondroit-Vit C-Mn (GLUCOSAMINE CHONDR 1500 COMPLX) CAPS Take 1 capsule by mouth daily.     imatinib  (GLEEVEC ) 100 MG tablet TAKE 3 TABLETS BY MOUTH EVERY DAY WITH A MEAL AND LARGE GLASS OF WATER  270 tablet 11   Loperamide HCl (IMODIUM PO) Take 0.5-1 tablets by mouth 2 (two) times daily as needed (takes 0.5 tablet every morning and 2nd dose if needed for diarrhea).      Multiple Vitamin (MULTIVITAMIN) tablet Take 1 tablet by mouth daily.     Multiple Vitamins-Minerals (PRESERVISION AREDS 2) CAPS Take 1 capsule by mouth 2 (two) times a day.     Probiotic Product (ALIGN) 4 MG CAPS Take 1 capsule by mouth every other day.      No current facility-administered medications on file prior to visit.    Review of Systems  Gastrointestinal:  Positive for diarrhea (from gleevac).  Musculoskeletal:  Positive for arthralgias (mild, stiffness).       Objective:  There were no vitals filed for this visit. There were no vitals filed for this visit. There is no height or weight on file to calculate BMI.  BP Readings from Last 3 Encounters:  05/09/23 123/69  04/19/23 (!) 115/59  04/09/23 114/86    Wt Readings from Last  3 Encounters:  05/09/23 117 lb 6.4 oz (53.3 kg)  04/19/23 113 lb 6.4 oz (51.4 kg)  03/21/23 114 lb 12.8 oz (52.1 kg)       Physical Exam Constitutional: She appears well-developed and well-nourished. No distress.  HENT:  Head: Normocephalic and atraumatic.  Right Ear: External ear normal. Normal ear canal and TM Left Ear: External ear normal.  Normal ear canal and TM Mouth/Throat: Oropharynx is clear and moist.  Eyes: Conjunctivae normal.  Neck: Neck supple. No tracheal deviation present. No thyromegaly present.  No carotid bruit  Cardiovascular: Normal rate, regular rhythm and normal heart sounds.   No murmur heard.  No edema. Pulmonary/Chest: Effort normal and breath sounds normal. No respiratory distress. She has no wheezes. She has no rales.  Breast: deferred   Abdominal: Soft. She exhibits no distension. There is no tenderness.  Lymphadenopathy: She has no cervical adenopathy.  Skin: Skin is warm and dry. She is not diaphoretic.  Psychiatric: She has a normal mood and affect. Her behavior is normal.     Lab Results  Component Value Date   WBC 4.4 05/09/2023   HGB 9.4 (L) 05/09/2023   HCT 27.5 (L) 05/09/2023   PLT 133 (L) 05/09/2023   GLUCOSE 105 (H) 03/04/2023   CHOL 191 05/25/2022   TRIG 83.0  05/25/2022   HDL 82.90 05/25/2022   LDLCALC 92 05/25/2022   ALT 17 03/04/2023   AST 33 03/04/2023   NA 138 03/04/2023   K 4.8 03/04/2023   CL 104 03/04/2023   CREATININE 1.39 (H) 03/04/2023   BUN 31 (H) 03/04/2023   CO2 31 03/04/2023   TSH 1.30 12/13/2022   INR 1.0 02/26/2019   HGBA1C 5.4 05/25/2022         Assessment & Plan:   Physical exam: Screening blood work  ordered Exercise   Weight   Substance abuse  none   Reviewed recommended immunizations.   Health Maintenance  Topic Date Due   Zoster Vaccines- Shingrix (1 of 2) 06/29/1955   DTaP/Tdap/Td (2 - Tdap) 04/30/2016   DEXA SCAN  02/19/2020   COVID-19 Vaccine (8 - 2024-25 season) 06/11/2023    Medicare Annual Wellness (AWV)  05/30/2024   Pneumonia Vaccine 64+ Years old  Completed   INFLUENZA VACCINE  Completed   HPV VACCINES  Aged Out          See Problem List for Assessment and Plan of chronic medical problems.    This encounter was created in error - please disregard.

## 2023-06-03 NOTE — Patient Instructions (Addendum)

## 2023-06-03 NOTE — Telephone Encounter (Signed)
Returned her call and given billing dept #. She has a question about a bill.

## 2023-06-04 ENCOUNTER — Encounter: Payer: Medicare PPO | Admitting: Internal Medicine

## 2023-06-04 DIAGNOSIS — Z0000 Encounter for general adult medical examination without abnormal findings: Secondary | ICD-10-CM

## 2023-06-06 ENCOUNTER — Inpatient Hospital Stay: Payer: Medicare PPO | Attending: Hematology and Oncology

## 2023-06-06 ENCOUNTER — Inpatient Hospital Stay: Payer: Medicare PPO

## 2023-06-06 ENCOUNTER — Inpatient Hospital Stay: Payer: Medicare PPO | Admitting: Hematology and Oncology

## 2023-06-06 ENCOUNTER — Encounter: Payer: Self-pay | Admitting: Hematology and Oncology

## 2023-06-06 VITALS — BP 139/67 | HR 66 | Temp 97.6°F | Resp 18 | Ht 61.0 in | Wt 117.2 lb

## 2023-06-06 DIAGNOSIS — D539 Nutritional anemia, unspecified: Secondary | ICD-10-CM | POA: Insufficient documentation

## 2023-06-06 DIAGNOSIS — C9210 Chronic myeloid leukemia, BCR/ABL-positive, not having achieved remission: Secondary | ICD-10-CM | POA: Insufficient documentation

## 2023-06-06 DIAGNOSIS — T451X5A Adverse effect of antineoplastic and immunosuppressive drugs, initial encounter: Secondary | ICD-10-CM | POA: Diagnosis not present

## 2023-06-06 DIAGNOSIS — D638 Anemia in other chronic diseases classified elsewhere: Secondary | ICD-10-CM

## 2023-06-06 DIAGNOSIS — D631 Anemia in chronic kidney disease: Secondary | ICD-10-CM

## 2023-06-06 DIAGNOSIS — Z79899 Other long term (current) drug therapy: Secondary | ICD-10-CM | POA: Insufficient documentation

## 2023-06-06 DIAGNOSIS — D61810 Antineoplastic chemotherapy induced pancytopenia: Secondary | ICD-10-CM | POA: Insufficient documentation

## 2023-06-06 LAB — CBC WITH DIFFERENTIAL/PLATELET
Abs Immature Granulocytes: 0.01 10*3/uL (ref 0.00–0.07)
Basophils Absolute: 0 10*3/uL (ref 0.0–0.1)
Basophils Relative: 1 %
Eosinophils Absolute: 0.1 10*3/uL (ref 0.0–0.5)
Eosinophils Relative: 2 %
HCT: 24.8 % — ABNORMAL LOW (ref 36.0–46.0)
Hemoglobin: 8.5 g/dL — ABNORMAL LOW (ref 12.0–15.0)
Immature Granulocytes: 0 %
Lymphocytes Relative: 31 %
Lymphs Abs: 1 10*3/uL (ref 0.7–4.0)
MCH: 37.9 pg — ABNORMAL HIGH (ref 26.0–34.0)
MCHC: 34.3 g/dL (ref 30.0–36.0)
MCV: 110.7 fL — ABNORMAL HIGH (ref 80.0–100.0)
Monocytes Absolute: 0.4 10*3/uL (ref 0.1–1.0)
Monocytes Relative: 13 %
Neutro Abs: 1.8 10*3/uL (ref 1.7–7.7)
Neutrophils Relative %: 53 %
Platelets: 135 10*3/uL — ABNORMAL LOW (ref 150–400)
RBC: 2.24 MIL/uL — ABNORMAL LOW (ref 3.87–5.11)
RDW: 13.6 % (ref 11.5–15.5)
WBC: 3.3 10*3/uL — ABNORMAL LOW (ref 4.0–10.5)
nRBC: 0 % (ref 0.0–0.2)

## 2023-06-06 MED ORDER — EPOETIN ALFA-EPBX 20000 UNIT/ML IJ SOLN
20000.0000 [IU] | Freq: Once | INTRAMUSCULAR | Status: AC
  Administered 2023-06-06: 20000 [IU] via SUBCUTANEOUS
  Filled 2023-06-06: qty 1

## 2023-06-06 NOTE — Assessment & Plan Note (Signed)
 Recent bone marrow biopsy showed no evidence of CML She will continue imatinib

## 2023-06-06 NOTE — Progress Notes (Signed)
 [facility] OFFICE PROGRESS NOTE  Patient Care Team: Geofm Glade PARAS, MD as PCP - General (Internal Medicine) Lavona Agent, MD as PCP - Cardiology (Cardiology) Melodi Lerner, MD as Consulting Physician (Orthopedic Surgery) Lonn Hicks, MD as Consulting Physician (Hematology and Oncology) Cleatus Collar, MD as Consulting Physician (Ophthalmology)  ASSESSMENT & PLAN:  Chronic myeloid leukemia [facility]) Recent bone marrow biopsy showed no evidence of CML She will continue imatinib   Anemia, chronic disease Bone marrow biopsy showed no evidence of malignancy.   We discussed risk and benefits of erythropoietin  stimulating agent for treatment of anemia chronic kidney disease This is likely anemia of chronic disease. The patient denies recent history of bleeding such as epistaxis, hematuria or hematochezia. She is symptomatic from the anemia.  We discussed the risks, benefits, side effects of erythropoietin  stimulating agents for anemia, with the goal of keeping the hemoglobin greater than 10 g. I discussed with the patient and potential side effects such as risk of thrombosis, severe hypertension, risk of congestive heart failure and stroke and she agreed to proceed. I will start initial dosing at 300 mcg every 3 weeks and we will reassess her response rates after 3 treatments to assess whether dosage adjustment is needed.     No orders of the defined types were placed in this encounter.   All questions were answered. The patient knows to call the clinic with any problems, questions or concerns. The total time spent in the appointment was 20 minutes encounter with patients including review of chart and various tests results, discussions about plan of care and coordination of care plan   Hicks Lonn, MD 06/06/2023 1:17 PM  INTERVAL HISTORY: Please see below for problem oriented charting. she returns for treatment and follow-up She returns here by herself She denies signs or  symptoms of anemia We discussed test results  REVIEW OF SYSTEMS:   Constitutional: Denies fevers, chills or abnormal weight loss Eyes: Denies blurriness of vision Ears, nose, mouth, throat, and face: Denies mucositis or sore throat Respiratory: Denies cough, dyspnea or wheezes Cardiovascular: Denies palpitation, chest discomfort or lower extremity swelling Gastrointestinal:  Denies nausea, heartburn or change in bowel habits Skin: Denies abnormal skin rashes Lymphatics: Denies new lymphadenopathy or easy bruising Neurological:Denies numbness, tingling or new weaknesses Behavioral/Psych: Mood is stable, no new changes  All other systems were reviewed with the patient and are negative.  I have reviewed the past medical history, past surgical history, social history and family history with the patient and they are unchanged from previous note.  ALLERGIES:  is allergic to penicillins.  MEDICATIONS:  Current Outpatient Medications  Medication Sig Dispense Refill   cholecalciferol (VITAMIN D3) 25 MCG (1000 UT) tablet Take 1,000 Units by mouth daily.     Cyanocobalamin  (B-12) 5000 MCG CAPS Take 1 tablet by mouth daily.     estradiol  (ESTRACE ) 0.1 MG/GM vaginal cream INSERT 1 APPLICATORFUL VAGINALLY 2 TIMES WEEKLY 42.5 g 0   FINACEA  15 % cream Apply 1 application topically daily.  2   folic acid  (FOLVITE ) 1 MG tablet TAKE 1 TABLET BY MOUTH EVERY DAY 30 tablet 6   Glucosamine-Chondroit-Vit C-Mn (GLUCOSAMINE CHONDR 1500 COMPLX) CAPS Take 1 capsule by mouth daily.     imatinib  (GLEEVEC ) 100 MG tablet TAKE 3 TABLETS BY MOUTH EVERY DAY WITH A MEAL AND LARGE GLASS OF WATER  270 tablet 11   Loperamide HCl (IMODIUM PO) Take 0.5-1 tablets by mouth 2 (two) times daily as needed (takes 0.5 tablet every morning  and 2nd dose if needed for diarrhea).      Multiple Vitamin (MULTIVITAMIN) tablet Take 1 tablet by mouth daily.     Multiple Vitamins-Minerals (PRESERVISION AREDS 2) CAPS Take 1 capsule by mouth 2  (two) times a day.     Probiotic Product (ALIGN) 4 MG CAPS Take 1 capsule by mouth every other day.      No current facility-administered medications for this visit.    SUMMARY OF ONCOLOGIC HISTORY: Oncology History Overview Note  Repeat Bone marrow biopsy in Dec 2024 neg for malignancy and neg for NGS   Chronic myeloid leukemia (HCC)  02/18/2007 Initial Diagnosis   Chronic myeloid leukemia   08/20/2014 Tumor Marker   BCR/ABL is undetectable   03/04/2015 Pathology Results   BCR/ABL is undetectable   08/29/2015 Tumor Marker   BCR/ABL is undetectable   02/29/2016 Pathology Results   BCR/ABL is undetectable   05/01/2016 Pathology Results   BCR/ABL b2a2 is detected at 0.03% and IS ratio 0.069%   05/31/2016 Pathology Results   BCR/ABL b2a2 is detected at 0.12% and IS ratio 0.276%   06/19/2016 Pathology Results   BCR/ABL b2a2 is detected at 0.24% and IS ratio 0.552%   06/19/2016 Miscellaneous   ABL mutation kinase testing is negative   06/27/2016 Miscellaneous   Dose of Gleevec  is increased to 600 mg daily   07/25/2016 Pathology Results   BCR/ABL b2a2 is detected at 0.03% and IS ratio 0.069%. She has achieved MMR   08/30/2016 Miscellaneous   Dose of Gleevec  is reduced to 300 mg daily and subsequently down to 200 mg daily   10/22/2016 Pathology Results   BCR/ABL b2a2 is not detected. She has achieved MMR   01/03/2017 Pathology Results   BCR/ABL b2a2 is detected IS ratio 0.041%   02/18/2017 Pathology Results   BCR/ABL b2a2 is detected IS ratio 0.0126   05/21/2017 Pathology Results   BCR/ABL b2a2 is not detected. She has achieved MMR   08/20/2017 Pathology Results   BCR/ABL b2a2 is not detected. She has achieved MMR   11/12/2017 Pathology Results   BCR/ABL b2a2 is not detected   04/07/2018 Pathology Results   BCR/ABL b2a2 is detected IS ratio 0.0062. She is in MMR   06/27/2018 Pathology Results   BCR/ABL b2a2 is detected IS ratio 0.0097%. She is in MMR   08/29/2018 Pathology  Results   BCR/ABL is undetectable. She is in MMR   12/26/2018 Pathology Results   BCR/ABL b2a2 is detected IS ratio 0.0042%. She is in MMR   05/04/2019 Pathology Results   BCR/ABL b2a2 is detected IS ratio 0.0575%. She is in MMR   08/03/2019 Pathology Results   BCR/ABL b2a2 is detected IS ratio 0.0167%. She is in MMR   02/01/2020 Pathology Results   BCR/ABL is undetectable   08/01/2020 Pathology Results   BCR/ABL is undetectable   02/28/2021 Pathology Results   BCR/ABL is undetectable   09/04/2021 Pathology Results   BCR/ABL is undetectable   02/28/2022 Pathology Results   BCR/ABL is undetectable    08/31/2022 Pathology Results   BCR/ABL is undetectable    03/04/2023 Pathology Results   BCR/ABL is undetectable    04/09/2023 Bone Marrow Biopsy   Surgical Pathology  CASE: [phone]   DIAGNOSIS:  BONE MARROW, ASPIRATE, CLOT, CORE: -  Essentially normocellular bone marrow with orderly trilineage hematopoiesis, no morphologic evidence of the patient's known chronic myeloid leukemia  PERIPHERAL BLOOD: -  Macrocytic anemia and thrombocytopenia  Note: Clinical history of both CML and  early breast cancer is noted. The peripheral blood does show some subtle abnormal nuclear clumping to the neutrophilic lineage; however, this is insufficient for morphologic diagnosis of dysplasia.  The aspirate smear slides show no morphologic evidence of dysplasia.  The biopsy and clot section are essentially normocellular.  There is no morphologic evidence of the patient's known CML (CML and morphologic remission).  The clinical history of breast cancer is noted, and a keratin stain is negative for malignancy.  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  If clinically indicated material is reserved for NexGen sequencing as clinically indicated.  Clinical correlation recommended.      PHYSICAL EXAMINATION: ECOG PERFORMANCE STATUS: 0 - Asymptomatic  Vitals:    06/06/23 1210  BP: 139/67  Pulse: 66  Resp: 18  Temp: 97.6 F (36.4 C)  SpO2: 99%   Filed Weights   06/06/23 1210  Weight: 117 lb 3.2 oz (53.2 kg)    GENERAL:alert, no distress and comfortable LABORATORY DATA:  I have reviewed the data as listed    Component Value Date/Time   NA 138 03/04/2023 1149   NA 140 02/18/2017 0808   K 4.8 03/04/2023 1149   K 4.4 02/18/2017 0808   CL 104 03/04/2023 1149   CL 105 09/04/2012 1006   CO2 31 03/04/2023 1149   CO2 29 02/18/2017 0808   GLUCOSE 105 (H) 03/04/2023 1149   GLUCOSE 86 02/18/2017 0808   GLUCOSE 96 09/04/2012 1006   BUN 31 (H) 03/04/2023 1149   BUN 20.3 02/18/2017 0808   CREATININE 1.39 (H) 03/04/2023 1149   CREATININE 0.93 05/04/2019 1108   CREATININE 1.1 02/18/2017 0808   CALCIUM 9.6 03/04/2023 1149   CALCIUM 9.4 02/18/2017 0808   PROT 6.7 03/04/2023 1149   PROT 6.6 02/18/2017 0808   ALBUMIN 3.9 03/04/2023 1149   ALBUMIN 4.2 02/18/2017 0808   AST 33 03/04/2023 1149   AST 34 05/04/2019 1108   AST 34 02/18/2017 0808   ALT 17 03/04/2023 1149   ALT 19 05/04/2019 1108   ALT 18 02/18/2017 0808   ALKPHOS 71 03/04/2023 1149   ALKPHOS 53 02/18/2017 0808   BILITOT 0.5 03/04/2023 1149   BILITOT 0.9 05/04/2019 1108   BILITOT 0.76 02/18/2017 0808   GFRNONAA 37 (L) 03/04/2023 1149   GFRNONAA 57 (L) 05/04/2019 1108   GFRAA 45 (L) 02/01/2020 1023   GFRAA >60 05/04/2019 1108    No results found for: SPEP, UPEP  Lab Results  Component Value Date   WBC 3.3 (L) 06/06/2023   NEUTROABS 1.8 06/06/2023   HGB 8.5 (L) 06/06/2023   HCT 24.8 (L) 06/06/2023   MCV 110.7 (H) 06/06/2023   PLT 135 (L) 06/06/2023      Chemistry      Component Value Date/Time   NA 138 03/04/2023 1149   NA 140 02/18/2017 0808   K 4.8 03/04/2023 1149   K 4.4 02/18/2017 0808   CL 104 03/04/2023 1149   CL 105 09/04/2012 1006   CO2 31 03/04/2023 1149   CO2 29 02/18/2017 0808   BUN 31 (H) 03/04/2023 1149   BUN 20.3 02/18/2017 0808    CREATININE 1.39 (H) 03/04/2023 1149   CREATININE 0.93 05/04/2019 1108   CREATININE 1.1 02/18/2017 0808      Component Value Date/Time   CALCIUM 9.6 03/04/2023 1149   CALCIUM 9.4 02/18/2017 0808   ALKPHOS 71 03/04/2023 1149   ALKPHOS 53 02/18/2017 0808   AST 33 03/04/2023 1149  AST 34 05/04/2019 1108   AST 34 02/18/2017 0808   ALT 17 03/04/2023 1149   ALT 19 05/04/2019 1108   ALT 18 02/18/2017 0808   BILITOT 0.5 03/04/2023 1149   BILITOT 0.9 05/04/2019 1108   BILITOT 0.76 02/18/2017 9191

## 2023-06-06 NOTE — Assessment & Plan Note (Signed)
 Bone marrow biopsy showed no evidence of malignancy.   We discussed risk and benefits of erythropoietin  stimulating agent for treatment of anemia chronic kidney disease This is likely anemia of chronic disease. The patient denies recent history of bleeding such as epistaxis, hematuria or hematochezia. She is symptomatic from the anemia.  We discussed the risks, benefits, side effects of erythropoietin  stimulating agents for anemia, with the goal of keeping the hemoglobin greater than 10 g. I discussed with the patient and potential side effects such as risk of thrombosis, severe hypertension, risk of congestive heart failure and stroke and she agreed to proceed. I will start initial dosing at 300 mcg every 3 weeks and we will reassess her response rates after 3 treatments to assess whether dosage adjustment is needed.

## 2023-06-06 NOTE — Patient Instructions (Signed)

## 2023-06-10 NOTE — Progress Notes (Deleted)
 Subjective:   [name] [name] is a 89 y.o. female who presents for Medicare Annual (Subsequent) preventive examination.  Visit Complete: In person   Cardiac Risk Factors include: advanced age (>69men, >60 women);Other (see comment), Risk factor comments: CKD     Objective:    Today's Vitals   05/31/23 0841  BP: 120/70  Pulse: 74  SpO2: 100%  Height: 5\' 1"  (1.549 m)   Body mass index is 22.18 kg/m.     05/31/2023   11:43 AM 06/15/2022    1:30 PM 06/07/2021    3:42 PM 04/27/2020    1:46 PM 03/31/2019    1:30 PM 03/02/2019    5:00 PM 03/02/2019    4:59 PM  Advanced Directives  Does Patient Have a Medical Advance Directive? Yes Yes Yes Yes Yes  Yes  Type of Estate agent of [place];Living will Healthcare Power of [place];Living will Living will;Healthcare Power of [employer] Power of [place];Living will Living will   Does patient want to make changes to medical advance directive?   No - Patient declined No - Patient declined  No - Patient declined No - Patient declined  Copy of Healthcare Power of Attorney in Chart? No - copy requested No - copy requested No - copy requested  No - copy requested      Current Medications (verified) Outpatient Encounter Medications as of 05/31/2023  Medication Sig   cholecalciferol (VITAMIN D3) 25 MCG (1000 UT) tablet Take 1,000 Units by mouth daily.   Cyanocobalamin (B-12) 5000 MCG CAPS Take 1 tablet by mouth daily.   estradiol (ESTRACE) 0.1 MG/GM vaginal cream INSERT 1 APPLICATORFUL VAGINALLY 2 TIMES WEEKLY   FINACEA 15 % cream Apply 1 application topically daily.   folic acid (FOLVITE) 1 MG tablet TAKE 1 TABLET BY MOUTH EVERY DAY   Glucosamine-Chondroit-Vit C-Mn (GLUCOSAMINE CHONDR 1500 COMPLX) CAPS Take 1 capsule by mouth daily.   imatinib (GLEEVEC) 100 MG tablet TAKE 3 TABLETS BY MOUTH EVERY DAY WITH A MEAL AND LARGE GLASS OF WATER   Loperamide HCl (IMODIUM PO) Take 0.5-1 tablets by mouth 2 (two) times  daily as needed (takes 0.5 tablet every morning and 2nd dose if needed for diarrhea).    Multiple Vitamin (MULTIVITAMIN) tablet Take 1 tablet by mouth daily.   Multiple Vitamins-Minerals (PRESERVISION AREDS 2) CAPS Take 1 capsule by mouth 2 (two) times a day.   Probiotic Product (ALIGN) 4 MG CAPS Take 1 capsule by mouth every other day.    No facility-administered encounter medications on file as of 05/31/2023.    Allergies (verified) Penicillins   History: Past Medical History:  Diagnosis Date   Anemia requiring transfusions 2010 & 2015   Arthritis    DJD   Breast cancer (HCC) 1993   S/P lumpectomy , radiation, & Tamoxifen   CML (chronic myeloid leukemia) (HCC)    Dr Bertis Ruddy;   SVT (supraventricular tachycardia) [facility])    dx june 2020 , magd by Dr Antoine Poche ; reports occasional heart flutter   Past Surgical History:  Procedure Laterality Date   ABDOMINAL HYSTERECTOMY  ~1989   no BSO, for Dysplasia    APPENDECTOMY  ~1989   BILATERAL SALPINGOOPHORECTOMY  2010   with bowel resection   BREAST SURGERY     Right lumpectomy, s/p radiation 1993 and oral  Tamoxifen x 5 years (1993-1998)    CATARACT EXTRACTION  06/08/2014   left eye;Dr Elmer Picker   CATARACT EXTRACTION, BILATERAL Bilateral 2015   COLONOSCOPY  03/2003, last 2010   diverticulosis, Dr.Kaplan   EXCISION MORTON'S NEUROMA Left mid 80's   foot   gastrocslide     left leg; Dr Lestine Box   ileostomy reversal  2011   3 mos after above   KNEE SURGERY  mid 80's   Arthroscopy, right knee    LEG TENDON SURGERY Left 2013   "cut on leg and fixed something"   OPEN SURGICAL REPAIR OF GLUTEAL TENDON Left 10/24/2016   Procedure: Left hip bursectomy; gluteal tendon repair;  Surgeon: Ollen Gross, MD;  Location: [facility];  Service: Orthopedics;  Laterality: Left;   sigmoid colon resection  2010    Dr Edwyna Shell, [place] for fistula & diverticulitis   TENDON REPAIR Left 2019   left shoulder tendon repair - surgical center    TOTAL HIP  ARTHROPLASTY Left 03/02/2019   Procedure: TOTAL HIP ARTHROPLASTY ANTERIOR APPROACH;  Surgeon: Ollen Gross, MD;  Location: [facility];  Service: Orthopedics;  Laterality: Left;    TOTAL KNEE ARTHROPLASTY Right 06/11/2013   Procedure: RIGHT TOTAL KNEE ARTHROPLASTY;  Surgeon: Eugenia Mcalpine, MD;  Location: [facility];  Service: Orthopedics;  Laterality: Right;   TRIGGER FINGER RELEASE Left 2013   thumb   Family History  Problem Relation Age of Onset   Colon cancer Mother 52   Uterine cancer Sister    Diabetes Cousin    Cancer Father        Lip cancer   Aneurysm Father        AAA;S/P surgery   Breast cancer Paternal Aunt    Heart attack Paternal Uncle 21   Stroke Neg Hx    Esophageal cancer Neg Hx    Rectal cancer Neg Hx    Stomach cancer Neg Hx    Social History   Socioeconomic History   Marital status: Married    Spouse name: Windy Fast   Number of children: 2   Years of education: Not on file   Highest education level: Not on file  Occupational History   Occupation: retired Company secretary  Tobacco Use   Smoking status: Former    Current packs/day: 0.00    Types: Cigarettes    Start date: 04/30/1957    Quit date: 04/30/1962    Years since quitting: 61.1   Smokeless tobacco: Never   Tobacco comments:    smoked 1956-1964, up to 1/2 ppd  Vaping Use   Vaping status: Never Used  Substance and Sexual Activity   Alcohol use: Yes    Alcohol/week: 7.0 - 14.0 standard drinks of alcohol    Types: 7 - 14 Glasses of wine per week    Comment: 10-29 about 10 glasses of wine weekly    Drug use: No   Sexual activity: Yes  Other Topics Concern   Not on file  Social History Narrative   Married, lives wit husband at [employer]   Social Drivers of Health   Financial Resource Strain: Low Risk  (05/31/2023)   Overall Financial Resource Strain (CARDIA)    Difficulty of Paying Living Expenses: Not hard at all  Food Insecurity: No Food Insecurity (05/31/2023)   Hunger Vital Sign    Worried  About Running Out of Food in the Last Year: Never true    Ran Out of Food in the Last Year: Never true  Transportation Needs: No Transportation Needs (05/31/2023)   PRAPARE - Administrator, Civil Service (Medical): No    Lack of Transportation (Non-Medical): No  Physical Activity: Inactive (  05/31/2023)   Exercise Vital Sign    Days of Exercise per Week: 0 days    Minutes of Exercise per Session: 0 min  Stress: No Stress Concern Present (05/31/2023)   [employer] of Occupational Health - Occupational Stress Questionnaire    Feeling of Stress : Only a little  Social Connections: Moderately Isolated (05/31/2023)   Social Connection and Isolation Panel [NHANES]    Frequency of Communication with Friends and Family: Twice a week    Frequency of Social Gatherings with Friends and Family: Never    Attends Religious Services: More than 4 times per year    Active Member of [employer] or Organizations: No    Attends Banker Meetings: Never    Marital Status: Married    Tobacco Counseling Counseling given: Not Answered Tobacco comments: smoked 1956-1964, up to 1/2 ppd   Clinical Intake:  Pre-visit preparation completed: Yes  Pain : No/denies pain     Nutritional Risks: None, Nausea/ vomitting/ diarrhea (diarrhea) Diabetes: No  How often do you need to have someone help you when you read instructions, pamphlets, or other written materials from your doctor or pharmacy?: 1 - Never  Interpreter Needed?: No  Information entered by :: Ferlin Fairhurst, RMA   Activities of Daily Living    05/31/2023   11:32 AM 06/15/2022    1:31 PM  In your present state of health, do you have any difficulty performing the following activities:  Hearing? 1 0  Comment has noticed some issues   Vision? 0 0  Comment  macular degeneration  Difficulty concentrating or making decisions? 0 1  Walking or climbing stairs? 0 0  Dressing or bathing? 0 0  Doing errands, shopping? 0 0   Preparing Food and eating ? N N  Using the Toilet? N N  In the past six months, have you accidently leaked urine? Y Y  Do you have problems with loss of bowel control? Y Y  Comment  due to diarrhea  Managing your Medications? N N  Managing your Finances? N N  Housekeeping or managing your Housekeeping? N N    Patient Care Team: Pincus Sanes, MD as PCP - General (Internal Medicine) Rollene Rotunda, MD as PCP - Cardiology (Cardiology) Ollen Gross, MD as Consulting Physician (Orthopedic Surgery) Artis Delay, MD as Consulting Physician (Hematology and Oncology) Mateo Flow, MD as Consulting Physician (Ophthalmology)  Indicate any recent Medical Services you may have received from other than [facility] providers in the past year (date may be approximate).     Assessment:   This is a routine wellness examination for [place].  Hearing/Vision screen Hearing Screening - Comments:: Has noticed some change Vision Screening - Comments:: Denies vision issues.    Goals Addressed   None   Depression Screen    05/31/2023   11:49 AM 12/13/2022    2:42 PM 06/15/2022    1:31 PM 05/25/2022    1:43 PM 06/07/2021    3:47 PM 04/27/2020    1:42 PM 03/31/2019    1:53 PM  PHQ 2/9 Scores  PHQ - 2 Score 1 0 0 0 0 0 0  PHQ- 9 Score 2   3       Fall Risk    05/31/2023   11:44 AM 01/24/2023   10:53 AM 12/13/2022    2:42 PM 06/15/2022    1:31 PM 05/25/2022    1:42 PM  Fall Risk   Falls in the past year? 0 0 0 0  0  Number falls in past yr: 0 0 0 0 0  Injury with Fall? 0 0 0 0 0  Risk for fall due to : No Fall Risks  No Fall Risks Medication side effect No Fall Risks  Follow up Falls prevention discussed;Falls evaluation completed Falls evaluation completed Falls evaluation completed Falls prevention discussed;Education provided;Falls evaluation completed Falls evaluation completed    MEDICARE RISK AT HOME: Medicare Risk at Home Any stairs in or around the home?: Yes If so, are there any  without handrails?: Yes Home free of loose throw rugs in walkways, pet beds, electrical cords, etc?: Yes Adequate lighting in your home to reduce risk of falls?: Yes Life alert?: Yes Use of a cane, walker or w/c?: No Grab bars in the bathroom?: Yes Shower chair or bench in shower?: No Elevated toilet seat or a handicapped toilet?: Yes  TIMED UP AND GO:  Was the test performed?  Yes  Length of time to ambulate 10 feet: 15 sec Gait slow and steady without use of assistive device    Cognitive Function:        05/31/2023   11:33 AM 06/15/2022    1:33 PM  6CIT Screen  What Year? 0 points 0 points  What month? 0 points 0 points  What time? 0 points 0 points  Count back from 20 0 points 0 points  Months in reverse 0 points 0 points  Repeat phrase 6 points 4 points  Total Score 6 points 4 points    Immunizations Immunization History  Administered Date(s) Administered   Fluad Quad(high Dose 65+) 02/06/2019, 02/01/2020, 02/11/2022   Fluad Trivalent(High Dose 65+) 01/24/2023   Influenza Split 02/17/2015   Influenza Whole 02/29/2012   Influenza, High Dose Seasonal PF 02/17/2016   Influenza,inj,Quad PF,6+ Mos 04/15/2018   Influenza,inj,quad, With Preservative 01/28/2017   Influenza-Unspecified 03/14/2013, 01/19/2014, 01/17/2017, 02/16/2021   PFIZER(Purple Top)SARS-COV-2 Vaccination 06/07/2019, 07/02/2019, 02/08/2020, 09/20/2020   Pfizer Covid-19 Vaccine Bivalent Booster 38yrs & up 03/30/2021, 01/07/2022   Pfizer(Comirnaty)Fall Seasonal Vaccine 12 years and older 04/16/2023   Pneumococcal Conjugate-13 03/01/2015   Pneumococcal Polysaccharide-23 03/30/2002, 09/29/2002, 03/11/2012   Td 04/30/2006   Zoster, Live 06/29/1986    TDAP status: Due, Education has been provided regarding the importance of this vaccine. Advised may receive this vaccine at local pharmacy or Health Dept. Aware to provide a copy of the vaccination record if obtained from local pharmacy or Health Dept.  Verbalized acceptance and understanding.  Flu Vaccine status: Up to date  Pneumococcal vaccine status: Up to date  Covid-19 vaccine status: Completed vaccines  Qualifies for Shingles Vaccine? Yes   Zostavax completed Yes   Shingrix Completed?: No.    Education has been provided regarding the importance of this vaccine. Patient has been advised to call insurance company to determine out of pocket expense if they have not yet received this vaccine. Advised may also receive vaccine at local pharmacy or Health Dept. Verbalized acceptance and understanding.  Screening Tests Health Maintenance  Topic Date Due   Zoster Vaccines- Shingrix (1 of 2) 06[birth year]29/1955   DTaP/Tdap/Td (2 - Tdap) 04/30/2016   DEXA SCAN  02/19/2020   COVID-19 Vaccine (8 - 2024-25 season) 06/11/2023   Medicare Annual Wellness (AWV)  05/30/2024   Pneumonia Vaccine 21+ Years old  Completed   INFLUENZA VACCINE  Completed   HPV VACCINES  Aged Out    Health Maintenance  Health Maintenance Due  Topic Date Due   Zoster Vaccines- Shingrix (1 of 2) 06/29/1955  DTaP/Tdap/Td (2 - Tdap) 04/30/2016   DEXA SCAN  02/19/2020   COVID-19 Vaccine (8 - 2024-25 season) 06/11/2023    Colorectal cancer screening: No longer required.   Mammogram status: Completed 07/04/2022. Repeat every year  Bone Density status: Ordered 05/31/23. Pt provided with contact info and advised to call to schedule appt.  Lung Cancer Screening: (Low Dose CT Chest recommended if Age 41-80 years, 20 pack-year currently smoking OR have quit w/in 15years.) does not qualify.   Lung Cancer Screening Referral: n/a  Additional Screening:  Hepatitis C Screening: does not qualify;   Vision Screening: Recommended annual ophthalmology exams for early detection of glaucoma and other disorders of the eye. Is the patient up to date with their annual eye exam?  Yes  Who is the provider or what is the name of the office in which the patient attends annual eye exams?  [facility] If pt is not established with a provider, would they like to be referred to a provider to establish care? No .   Dental Screening: Recommended annual dental exams for proper oral hygiene   Community Resource Referral / Chronic Care Management: CRR required this visit?  No   CCM required this visit?  No     Plan:     I have personally reviewed and noted the following in the patient's chart:   Medical and social history Use of alcohol, tobacco or illicit drugs  Current medications and supplements including opioid prescriptions. Patient is not currently taking opioid prescriptions. Functional ability and status Nutritional status Physical activity Advanced directives List of other physicians Hospitalizations, surgeries, and ER visits in previous 12 months Vitals Screenings to include cognitive, depression, and falls Referrals and appointments  In addition, I have reviewed and discussed with patient certain preventive protocols, quality metrics, and best practice recommendations. A written personalized care plan for preventive services as well as general preventive health recommendations were provided to patient.     [name] [name], CMA   06/10/2023   After Visit Summary: (MyChart) Due to this being a telephonic visit, the after visit summary with patients personalized plan was offered to patient via MyChart   Nurse Notes: Patient is due for a DEXA and it has been ordered today.  She is already scheduled for a mammogram.  Patient stated that she received all her vaccines, however they are not documented in the [place].  She also informed that she noticed her diarrhea has been more frequent since she started eating some bread with seeds in it.  The bread has over 20 different seeds in it.  She will stop eating seeds and the bread with the seeds in it to see if that will help with diarrhea.  Patient had no other concern to discuss today.

## 2023-06-13 DIAGNOSIS — H353132 Nonexudative age-related macular degeneration, bilateral, intermediate dry stage: Secondary | ICD-10-CM | POA: Diagnosis not present

## 2023-06-13 DIAGNOSIS — H04123 Dry eye syndrome of bilateral lacrimal glands: Secondary | ICD-10-CM | POA: Diagnosis not present

## 2023-06-13 DIAGNOSIS — H16223 Keratoconjunctivitis sicca, not specified as Sjogren's, bilateral: Secondary | ICD-10-CM | POA: Diagnosis not present

## 2023-06-17 ENCOUNTER — Encounter: Payer: Self-pay | Admitting: Internal Medicine

## 2023-06-17 NOTE — Progress Notes (Unsigned)
Subjective:    Patient ID: [name] [name], female    DOB: October 24, 1934, 89 y.o.   MRN: [id number]      HPI [name] [name] is here for a Physical exam and her chronic medical problems.   Overall doing well and has no specific concerns.   Medications and allergies reviewed with patient and updated if appropriate.  Current Outpatient Medications on File Prior to Visit  Medication Sig Dispense Refill   cholecalciferol (VITAMIN D3) 25 MCG (1000 UT) tablet Take 1,000 Units by mouth daily.     Cyanocobalamin (B-12) 5000 MCG CAPS Take 1 tablet by mouth daily.     estradiol (ESTRACE) 0.1 MG/GM vaginal cream INSERT 1 APPLICATORFUL VAGINALLY 2 TIMES WEEKLY 42.5 g 0   FINACEA 15 % cream Apply 1 application topically daily.  2   folic acid (FOLVITE) 1 MG tablet TAKE 1 TABLET BY MOUTH EVERY DAY 30 tablet 6   Glucosamine-Chondroit-Vit C-Mn (GLUCOSAMINE CHONDR 1500 COMPLX) CAPS Take 1 capsule by mouth daily.     imatinib (GLEEVEC) 100 MG tablet TAKE 3 TABLETS BY MOUTH EVERY DAY WITH A MEAL AND LARGE GLASS OF WATER 270 tablet 11   Loperamide HCl (IMODIUM PO) Take 0.5-1 tablets by mouth 2 (two) times daily as needed (takes 0.5 tablet every morning and 2nd dose if needed for diarrhea).      Multiple Vitamin (MULTIVITAMIN) tablet Take 1 tablet by mouth daily.     Multiple Vitamins-Minerals (PRESERVISION AREDS 2) CAPS Take 1 capsule by mouth 2 (two) times a day.     Probiotic Product (ALIGN) 4 MG CAPS Take 1 capsule by mouth every other day.      No current facility-administered medications on file prior to visit.    Review of Systems  Constitutional:  Negative for fever.  HENT:  Negative for postnasal drip.   Eyes:  Negative for visual disturbance.  Respiratory:  Positive for cough (dry - couple months). Negative for shortness of breath and wheezing.   Cardiovascular:  Negative for chest pain, palpitations and leg swelling.  Gastrointestinal:  Positive for diarrhea (from gleevac). Negative for  abdominal pain, blood in stool and constipation.       No gerd  Genitourinary:  Negative for dysuria.  Musculoskeletal:  Positive for arthralgias (mild, stiffness) and back pain (lower back).  Skin:  Negative for rash.  Neurological:  Negative for light-headedness and headaches.  Psychiatric/Behavioral:  Negative for dysphoric mood and sleep disturbance. The patient is not nervous/anxious.        Objective:   Vitals:   06/18/23 1041  BP: 134/70  Pulse: 74  Temp: 97.9 F (36.6 C)  SpO2: 94%   Filed Weights   06/18/23 1041  Weight: 114 lb (51.7 kg)   Body mass index is 21.54 kg/m.  BP Readings from Last 3 Encounters:  06/18/23 134/70  06/06/23 139/67  05/31/23 120/70    Wt Readings from Last 3 Encounters:  06/18/23 114 lb (51.7 kg)  06/06/23 117 lb 3.2 oz (53.2 kg)  05/09/23 117 lb 6.4 oz (53.3 kg)       Physical Exam Constitutional: She appears well-developed and well-nourished. No distress.  HENT:  Head: Normocephalic and atraumatic.  Right Ear: External ear normal. Normal ear canal and TM Left Ear: External ear normal.  Normal ear canal and TM Mouth/Throat: Oropharynx is clear and moist.  Eyes: Conjunctivae normal.  Neck: Neck supple. No tracheal deviation present. No thyromegaly present.  No carotid bruit  Cardiovascular: Normal rate,  regular rhythm and normal heart sounds.   No murmur heard.  No edema. Pulmonary/Chest: Effort normal and breath sounds normal. No respiratory distress. She has no wheezes. She has no rales.  Breast: deferred   Abdominal: Soft. She exhibits no distension. There is no tenderness.  Lymphadenopathy: She has no cervical adenopathy.  Skin: Skin is warm and dry. She is not diaphoretic.  Psychiatric: She has a normal mood and affect. Her behavior is normal.     Lab Results  Component Value Date   WBC 3.3 (L) 06/06/2023   HGB 8.5 (L) 06/06/2023   HCT 24.8 (L) 06/06/2023   PLT 135 (L) 06/06/2023   GLUCOSE 105 (H) 03/04/2023    CHOL 191 05/25/2022   TRIG 83.0 05/25/2022   HDL 82.90 05/25/2022   LDLCALC 92 05/25/2022   ALT 17 03/04/2023   AST 33 03/04/2023   NA 138 03/04/2023   K 4.8 03/04/2023   CL 104 03/04/2023   CREATININE 1.39 (H) 03/04/2023   BUN 31 (H) 03/04/2023   CO2 31 03/04/2023   TSH 1.30 12/13/2022   INR 1.0 02/26/2019   HGBA1C 5.4 05/25/2022         Assessment & Plan:   Physical exam: Screening blood work  ordered Exercise  walking Weight  normal    Substance abuse  none   Reviewed recommended immunizations.  Reviewed immunizations   Health Maintenance  Topic Date Due   DEXA SCAN  02/19/2020   COVID-19 Vaccine (8 - 2024-25 season) 07/04/2023 (Originally 06/11/2023)   Zoster Vaccines- Shingrix (1 of 2) 09/15/2023 (Originally 06[birth year]29/1955)   DTaP/Tdap/Td (2 - Tdap) 06/17/2024 (Originally 04/30/2016)   Medicare Annual Wellness (AWV)  05/30/2024   Pneumonia Vaccine 84+ Years old  Completed   INFLUENZA VACCINE  Completed   HPV VACCINES  Aged Out          See Problem List for Assessment and Plan of chronic medical problems.

## 2023-06-18 ENCOUNTER — Ambulatory Visit: Payer: Medicare PPO | Admitting: Internal Medicine

## 2023-06-18 VITALS — BP 134/70 | HR 74 | Temp 97.9°F | Ht 61.0 in | Wt 114.0 lb

## 2023-06-18 DIAGNOSIS — C9210 Chronic myeloid leukemia, BCR/ABL-positive, not having achieved remission: Secondary | ICD-10-CM | POA: Diagnosis not present

## 2023-06-18 DIAGNOSIS — Z0000 Encounter for general adult medical examination without abnormal findings: Secondary | ICD-10-CM | POA: Diagnosis not present

## 2023-06-18 DIAGNOSIS — N1832 Chronic kidney disease, stage 3b: Secondary | ICD-10-CM

## 2023-06-18 DIAGNOSIS — N952 Postmenopausal atrophic vaginitis: Secondary | ICD-10-CM | POA: Diagnosis not present

## 2023-06-18 DIAGNOSIS — M85859 Other specified disorders of bone density and structure, unspecified thigh: Secondary | ICD-10-CM

## 2023-06-18 MED ORDER — ESTRADIOL 0.1 MG/GM VA CREA: TOPICAL_CREAM | VAGINAL | 8 refills | Status: AC

## 2023-06-18 NOTE — Patient Instructions (Addendum)
Consider the RSV vaccine, tetanus vaccine and shingles vaccines     Medications changes include :   None     Return in about 1 year (around 06/17/2024) for Physical Exam.   Health Maintenance, Female Adopting a healthy lifestyle and getting preventive care are important in promoting health and wellness. Ask your health care provider about: The right schedule for you to have regular tests and exams. Things you can do on your own to prevent diseases and keep yourself healthy. What should I know about diet, weight, and exercise? Eat a healthy diet  Eat a diet that includes plenty of vegetables, fruits, low-fat dairy products, and lean protein. Do not eat a lot of foods that are high in solid fats, added sugars, or sodium. Maintain a healthy weight Body mass index (BMI) is used to identify weight problems. It estimates body fat based on height and weight. Your health care provider can help determine your BMI and help you achieve or maintain a healthy weight. Get regular exercise Get regular exercise. This is one of the most important things you can do for your health. Most adults should: Exercise for at least 150 minutes each week. The exercise should increase your heart rate and make you sweat (moderate-intensity exercise). Do strengthening exercises at least twice a week. This is in addition to the moderate-intensity exercise. Spend less time sitting. Even light physical activity can be beneficial. Watch cholesterol and blood lipids Have your blood tested for lipids and cholesterol at 87 years of age, then have this test every 5 years. Have your cholesterol levels checked more often if: Your lipid or cholesterol levels are high. You are older than 87 years of age. You are at high risk for heart disease. What should I know about cancer screening? Depending on your health history and family history, you may need to have cancer screening at various ages. This may include screening  for: Breast cancer. Cervical cancer. Colorectal cancer. Skin cancer. Lung cancer. What should I know about heart disease, diabetes, and high blood pressure? Blood pressure and heart disease High blood pressure causes heart disease and increases the risk of stroke. This is more likely to develop in people who have high blood pressure readings or are overweight. Have your blood pressure checked: Every 3-5 years if you are 39-52 years of age. Every year if you are 50 years old or older. Diabetes Have regular diabetes screenings. This checks your fasting blood sugar level. Have the screening done: Once every three years after age 40 if you are at a normal weight and have a low risk for diabetes. More often and at a younger age if you are overweight or have a high risk for diabetes. What should I know about preventing infection? Hepatitis B If you have a higher risk for hepatitis B, you should be screened for this virus. Talk with your health care provider to find out if you are at risk for hepatitis B infection. Hepatitis C Testing is recommended for: Everyone born from [birth year] through 1965. Anyone with known risk factors for hepatitis C. Sexually transmitted infections (STIs) Get screened for STIs, including gonorrhea and chlamydia, if: You are sexually active and are younger than 87 years of age. You are older than 87 years of age and your health care provider tells you that you are at risk for this type of infection. Your sexual activity has changed since you were last screened, and you are at increased risk for chlamydia or  gonorrhea. Ask your health care provider if you are at risk. Ask your health care provider about whether you are at high risk for HIV. Your health care provider may recommend a prescription medicine to help prevent HIV infection. If you choose to take medicine to prevent HIV, you should first get tested for HIV. You should then be tested every 3 months for as long as you  are taking the medicine. Pregnancy If you are about to stop having your period (premenopausal) and you may become pregnant, seek counseling before you get pregnant. Take 400 to 800 micrograms (mcg) of folic acid every day if you become pregnant. Ask for birth control (contraception) if you want to prevent pregnancy. Osteoporosis and menopause Osteoporosis is a disease in which the bones lose minerals and strength with aging. This can result in bone fractures. If you are 36 years old or older, or if you are at risk for osteoporosis and fractures, ask your health care provider if you should: Be screened for bone loss. Take a calcium or vitamin D supplement to lower your risk of fractures. Be given hormone replacement therapy (HRT) to treat symptoms of menopause. Follow these instructions at home: Alcohol use Do not drink alcohol if: Your health care provider tells you not to drink. You are pregnant, may be pregnant, or are planning to become pregnant. If you drink alcohol: Limit how much you have to: 0-1 drink a day. Know how much alcohol is in your drink. In the [place] one drink equals one 12 oz bottle of beer (355 mL), one 5 oz glass of wine (148 mL), or one 1 oz glass of hard liquor (44 mL). Lifestyle Do not use any products that contain nicotine or tobacco. These products include cigarettes, chewing tobacco, and vaping devices, such as e-cigarettes. If you need help quitting, ask your health care provider. Do not use street drugs. Do not share needles. Ask your health care provider for help if you need support or information about quitting drugs. General instructions Schedule regular health, dental, and eye exams. Stay current with your vaccines. Tell your health care provider if: You often feel depressed. You have ever been abused or do not feel safe at home. Summary Adopting a healthy lifestyle and getting preventive care are important in promoting health and wellness. Follow your  health care provider's instructions about healthy diet, exercising, and getting tested or screened for diseases. Follow your health care provider's instructions on monitoring your cholesterol and blood pressure. This information is not intended to replace advice given to you by your health care provider. Make sure you discuss any questions you have with your health care provider. Document Revised: 09/05/2020 Document Reviewed: 09/05/2020 Elsevier Patient Education  2024 [employer].

## 2023-06-18 NOTE — Assessment & Plan Note (Signed)
Chronic Has been stable-slight decline She does not drink much water and advised her to increase her water intake She does not use any NSAIDs

## 2023-06-18 NOTE — Assessment & Plan Note (Signed)
Chronic DEXA ?  Up-to-date-she states it is followed by her oncologist-she will schedule Continue regular exercise Continue vitamin D, multivitamin

## 2023-06-18 NOTE — Assessment & Plan Note (Signed)
Chronic Stable Following with oncology

## 2023-06-18 NOTE — Assessment & Plan Note (Signed)
Chronic Currently using vaginal estrogen as needed-gust using this twice a week for maintenance Refill sent to pharmacy

## 2023-06-24 NOTE — Progress Notes (Signed)
 Subjective:   [name] [name] is a 89 y.o. female who presents for Medicare Annual (Subsequent) preventive examination.  Visit Complete: In person   Cardiac Risk Factors include: advanced age (>37men, >36 women);Other (see comment), Risk factor comments: CKD Patient refused weight today.     Objective:    Today's Vitals   05/31/23 0841  BP: 120/70  Pulse: 74  SpO2: 100%  Height: 5\' 1"  (1.549 m)   Body mass index is 22.18 kg/m.     05/31/2023   11:43 AM 06/15/2022    1:30 PM 06/07/2021    3:42 PM 04/27/2020    1:46 PM 03/31/2019    1:30 PM 03/02/2019    5:00 PM 03/02/2019    4:59 PM  Advanced Directives  Does Patient Have a Medical Advance Directive? Yes Yes Yes Yes Yes  Yes  Type of Estate agent of [place];Living will Healthcare Power of [place];Living will Living will;Healthcare Power of [employer] Power of [place];Living will Living will   Does patient want to make changes to medical advance directive?   No - Patient declined No - Patient declined  No - Patient declined No - Patient declined  Copy of Healthcare Power of Attorney in Chart? No - copy requested No - copy requested No - copy requested  No - copy requested      Current Medications (verified) Outpatient Encounter Medications as of 05/31/2023  Medication Sig   cholecalciferol (VITAMIN D3) 25 MCG (1000 UT) tablet Take 1,000 Units by mouth daily.   Cyanocobalamin (B-12) 5000 MCG CAPS Take 1 tablet by mouth daily.   FINACEA 15 % cream Apply 1 application topically daily.   folic acid (FOLVITE) 1 MG tablet TAKE 1 TABLET BY MOUTH EVERY DAY   Glucosamine-Chondroit-Vit C-Mn (GLUCOSAMINE CHONDR 1500 COMPLX) CAPS Take 1 capsule by mouth daily.   imatinib (GLEEVEC) 100 MG tablet TAKE 3 TABLETS BY MOUTH EVERY DAY WITH A MEAL AND LARGE GLASS OF WATER   Loperamide HCl (IMODIUM PO) Take 0.5-1 tablets by mouth 2 (two) times daily as needed (takes 0.5 tablet every morning and 2nd dose if  needed for diarrhea).    Multiple Vitamin (MULTIVITAMIN) tablet Take 1 tablet by mouth daily.   Multiple Vitamins-Minerals (PRESERVISION AREDS 2) CAPS Take 1 capsule by mouth 2 (two) times a day.   Probiotic Product (ALIGN) 4 MG CAPS Take 1 capsule by mouth every other day.    [DISCONTINUED] estradiol (ESTRACE) 0.1 MG/GM vaginal cream INSERT 1 APPLICATORFUL VAGINALLY 2 TIMES WEEKLY   No facility-administered encounter medications on file as of 05/31/2023.    Allergies (verified) Penicillins   History: Past Medical History:  Diagnosis Date   Anemia requiring transfusions 2010 & 2015   Arthritis    DJD   Breast cancer (HCC) 1993   S/P lumpectomy , radiation, & Tamoxifen   CML (chronic myeloid leukemia) (HCC)    Dr Bertis Ruddy;   SVT (supraventricular tachycardia) [facility])    dx june 2020 , magd by Dr Antoine Poche ; reports occasional heart flutter   Past Surgical History:  Procedure Laterality Date   ABDOMINAL HYSTERECTOMY  ~1989   no BSO, for Dysplasia    APPENDECTOMY  ~1989   BILATERAL SALPINGOOPHORECTOMY  2010   with bowel resection   BREAST SURGERY     Right lumpectomy, s/p radiation 1993 and oral  Tamoxifen x 5 years (1993-1998)    CATARACT EXTRACTION  06/08/2014   left eye;Dr Elmer Picker   CATARACT EXTRACTION, BILATERAL Bilateral  2015   COLONOSCOPY  03/2003, last 2010   diverticulosis, Dr.Kaplan   EXCISION MORTON'S NEUROMA Left mid 80's   foot   gastrocslide     left leg; Dr Lestine Box   ileostomy reversal  2011   3 mos after above   KNEE SURGERY  mid 80's   Arthroscopy, right knee    LEG TENDON SURGERY Left 2013   "cut on leg and fixed something"   OPEN SURGICAL REPAIR OF GLUTEAL TENDON Left 10/24/2016   Procedure: Left hip bursectomy; gluteal tendon repair;  Surgeon: Ollen Gross, MD;  Location: [facility];  Service: Orthopedics;  Laterality: Left;   sigmoid colon resection  2010    Dr Edwyna Shell, [place] for fistula & diverticulitis   TENDON REPAIR Left 2019   left shoulder tendon  repair - surgical center    TOTAL HIP ARTHROPLASTY Left 03/02/2019   Procedure: TOTAL HIP ARTHROPLASTY ANTERIOR APPROACH;  Surgeon: Ollen Gross, MD;  Location: [facility];  Service: Orthopedics;  Laterality: Left;    TOTAL KNEE ARTHROPLASTY Right 06/11/2013   Procedure: RIGHT TOTAL KNEE ARTHROPLASTY;  Surgeon: Eugenia Mcalpine, MD;  Location: [facility];  Service: Orthopedics;  Laterality: Right;   TRIGGER FINGER RELEASE Left 2013   thumb   Family History  Problem Relation Age of Onset   Colon cancer Mother 64   Uterine cancer Sister    Diabetes Cousin    Cancer Father        Lip cancer   Aneurysm Father        AAA;S/P surgery   Breast cancer Paternal Aunt    Heart attack Paternal Uncle 33   Stroke Neg Hx    Esophageal cancer Neg Hx    Rectal cancer Neg Hx    Stomach cancer Neg Hx    Social History   Socioeconomic History   Marital status: Married    Spouse name: Windy Fast   Number of children: 2   Years of education: Not on file   Highest education level: Not on file  Occupational History   Occupation: retired Company secretary  Tobacco Use   Smoking status: Former    Current packs/day: 0.00    Types: Cigarettes    Start date: 04/30/1957    Quit date: 04/30/1962    Years since quitting: 61.1   Smokeless tobacco: Never   Tobacco comments:    smoked 1956-1964, up to 1/2 ppd  Vaping Use   Vaping status: Never Used  Substance and Sexual Activity   Alcohol use: Yes    Alcohol/week: 7.0 - 14.0 standard drinks of alcohol    Types: 7 - 14 Glasses of wine per week    Comment: 10-29 about 10 glasses of wine weekly    Drug use: No   Sexual activity: Yes  Other Topics Concern   Not on file  Social History Narrative   Married, lives wit husband at [employer]   Social Drivers of Health   Financial Resource Strain: Low Risk  (05/31/2023)   Overall Financial Resource Strain (CARDIA)    Difficulty of Paying Living Expenses: Not hard at all  Food Insecurity: No Food Insecurity  (05/31/2023)   Hunger Vital Sign    Worried About Running Out of Food in the Last Year: Never true    Ran Out of Food in the Last Year: Never true  Transportation Needs: No Transportation Needs (05/31/2023)   PRAPARE - Administrator, Civil Service (Medical): No    Lack of Transportation (Non-Medical):  No  Physical Activity: Inactive (05/31/2023)   Exercise Vital Sign    Days of Exercise per Week: 0 days    Minutes of Exercise per Session: 0 min  Stress: No Stress Concern Present (05/31/2023)   [employer] of Occupational Health - Occupational Stress Questionnaire    Feeling of Stress : Only a little  Social Connections: Moderately Isolated (05/31/2023)   Social Connection and Isolation Panel [NHANES]    Frequency of Communication with Friends and Family: Twice a week    Frequency of Social Gatherings with Friends and Family: Never    Attends Religious Services: More than 4 times per year    Active Member of [employer] or Organizations: No    Attends Banker Meetings: Never    Marital Status: Married    Tobacco Counseling Counseling given: Not Answered Tobacco comments: smoked 1956-1964, up to 1/2 ppd   Clinical Intake:  Pre-visit preparation completed: Yes  Pain : No/denies pain     Nutritional Risks: None, Nausea/ vomitting/ diarrhea (diarrhea) Diabetes: No  How often do you need to have someone help you when you read instructions, pamphlets, or other written materials from your doctor or pharmacy?: 1 - Never  Interpreter Needed?: No  Information entered by :: Natsha Guidry, RMA   Activities of Daily Living    05/31/2023   11:32 AM  In your present state of health, do you have any difficulty performing the following activities:  Hearing? 1  Comment has noticed some issues  Vision? 0  Difficulty concentrating or making decisions? 0  Walking or climbing stairs? 0  Dressing or bathing? 0  Doing errands, shopping? 0  Preparing Food and  eating ? N  Using the Toilet? N  In the past six months, have you accidently leaked urine? Y  Do you have problems with loss of bowel control? Y  Managing your Medications? N  Managing your Finances? N  Housekeeping or managing your Housekeeping? N    Patient Care Team: Pincus Sanes, MD as PCP - General (Internal Medicine) Rollene Rotunda, MD as PCP - Cardiology (Cardiology) Ollen Gross, MD as Consulting Physician (Orthopedic Surgery) Artis Delay, MD as Consulting Physician (Hematology and Oncology) Mateo Flow, MD as Consulting Physician (Ophthalmology)  Indicate any recent Medical Services you may have received from other than [facility] providers in the past year (date may be approximate).     Assessment:   This is a routine wellness examination for Amelia.  Hearing/Vision screen Hearing Screening - Comments:: Has noticed some change Vision Screening - Comments:: Denies vision issues.    Goals Addressed   None   Depression Screen    06/18/2023   10:47 AM 05/31/2023   11:49 AM 12/13/2022    2:42 PM 06/15/2022    1:31 PM 05/25/2022    1:43 PM 06/07/2021    3:47 PM 04/27/2020    1:42 PM  PHQ 2/9 Scores  PHQ - 2 Score 0 1 0 0 0 0 0  PHQ- 9 Score 1 2   3       Fall Risk    06/18/2023   10:47 AM 05/31/2023   11:44 AM 01/24/2023   10:53 AM 12/13/2022    2:42 PM 06/15/2022    1:31 PM  Fall Risk   Falls in the past year? 0 0 0 0 0  Number falls in past yr: 0 0 0 0 0  Injury with Fall? 0 0 0 0 0  Risk for fall due to :  No Fall Risks No Fall Risks  No Fall Risks Medication side effect  Follow up Falls evaluation completed Falls prevention discussed;Falls evaluation completed Falls evaluation completed Falls evaluation completed Falls prevention discussed;Education provided;Falls evaluation completed    MEDICARE RISK AT HOME: Medicare Risk at Home Any stairs in or around the home?: Yes If so, are there any without handrails?: Yes Home free of loose throw rugs in  walkways, pet beds, electrical cords, etc?: Yes Adequate lighting in your home to reduce risk of falls?: Yes Life alert?: Yes Use of a cane, walker or w/c?: No Grab bars in the bathroom?: Yes Shower chair or bench in shower?: No Elevated toilet seat or a handicapped toilet?: Yes  TIMED UP AND GO:  Was the test performed?  Yes  Length of time to ambulate 10 feet: 15 sec Gait slow and steady without use of assistive device    Cognitive Function:        05/31/2023   11:33 AM 06/15/2022    1:33 PM  6CIT Screen  What Year? 0 points 0 points  What month? 0 points 0 points  What time? 0 points 0 points  Count back from 20 0 points 0 points  Months in reverse 0 points 0 points  Repeat phrase 6 points 4 points  Total Score 6 points 4 points    Immunizations Immunization History  Administered Date(s) Administered   Fluad Quad(high Dose 65+) 02/06/2019, 02/01/2020, 02/11/2022   Fluad Trivalent(High Dose 65+) 01/24/2023   Influenza Split 02/17/2015   Influenza Whole 02/29/2012   Influenza, High Dose Seasonal PF 02/17/2016   Influenza,inj,Quad PF,6+ Mos 04/15/2018   Influenza,inj,quad, With Preservative 01/28/2017   Influenza-Unspecified 03/14/2013, 01/19/2014, 01/17/2017, 02/16/2021   PFIZER(Purple Top)SARS-COV-2 Vaccination 06/07/2019, 07/02/2019, 02/08/2020, 09/20/2020   Pfizer Covid-19 Vaccine Bivalent Booster 38yrs & up 03/30/2021, 01/07/2022   Pfizer(Comirnaty)Fall Seasonal Vaccine 12 years and older 04/16/2023   Pneumococcal Conjugate-13 03/01/2015   Pneumococcal Polysaccharide-23 03/30/2002, 09/29/2002, 03/11/2012   Td 04/30/2006   Zoster, Live 06/29/1986    TDAP status: Due, Education has been provided regarding the importance of this vaccine. Advised may receive this vaccine at local pharmacy or Health Dept. Aware to provide a copy of the vaccination record if obtained from local pharmacy or Health Dept. Verbalized acceptance and understanding.  Flu Vaccine status: Up  to date  Pneumococcal vaccine status: Up to date  Covid-19 vaccine status: Completed vaccines  Qualifies for Shingles Vaccine? Yes   Zostavax completed Yes   Shingrix Completed?: No.    Education has been provided regarding the importance of this vaccine. Patient has been advised to call insurance company to determine out of pocket expense if they have not yet received this vaccine. Advised may also receive vaccine at local pharmacy or Health Dept. Verbalized acceptance and understanding.  Screening Tests Health Maintenance  Topic Date Due   DEXA SCAN  02/19/2020   COVID-19 Vaccine (8 - 2024-25 season) 07/04/2023 (Originally 06/11/2023)   Zoster Vaccines- Shingrix (1 of 2) 09/15/2023 (Originally 06/29/1953)   DTaP/Tdap/Td (2 - Tdap) 06/17/2024 (Originally 04/30/2016)   Medicare Annual Wellness (AWV)  05/30/2024   Pneumonia Vaccine 37+ Years old  Completed   INFLUENZA VACCINE  Completed   HPV VACCINES  Aged Out    Health Maintenance  Health Maintenance Due  Topic Date Due   DEXA SCAN  02/19/2020    Colorectal cancer screening: No longer required.   Mammogram status: Completed 07/04/2022. Repeat every year  Bone Density status: Ordered 05/31/23. Pt  provided with contact info and advised to call to schedule appt.  Lung Cancer Screening: (Low Dose CT Chest recommended if Age 18-80 years, 20 pack-year currently smoking OR have quit w/in 15years.) does not qualify.   Lung Cancer Screening Referral: n/a  Additional Screening:  Hepatitis C Screening: does not qualify;   Vision Screening: Recommended annual ophthalmology exams for early detection of glaucoma and other disorders of the eye. Is the patient up to date with their annual eye exam?  Yes  Who is the provider or what is the name of the office in which the patient attends annual eye exams? [facility] If pt is not established with a provider, would they like to be referred to a provider to establish care? No .   Dental  Screening: Recommended annual dental exams for proper oral hygiene   Community Resource Referral / Chronic Care Management: CRR required this visit?  No   CCM required this visit?  No     Plan:     I have personally reviewed and noted the following in the patient's chart:   Medical and social history Use of alcohol, tobacco or illicit drugs  Current medications and supplements including opioid prescriptions. Patient is not currently taking opioid prescriptions. Functional ability and status Nutritional status Physical activity Advanced directives List of other physicians Hospitalizations, surgeries, and ER visits in previous 12 months Vitals Screenings to include cognitive, depression, and falls Referrals and appointments  In addition, I have reviewed and discussed with patient certain preventive protocols, quality metrics, and best practice recommendations. A written personalized care plan for preventive services as well as general preventive health recommendations were provided to patient.     Porche Steinberger L Cale Decarolis, CMA   05/31/2023  After Visit Summary: (MyChart) Due to this being a telephonic visit, the after visit summary with patients personalized plan was offered to patient via MyChart   Nurse Notes: Patient is due for a DEXA and it has been ordered today.  She is already scheduled for a mammogram.  Patient stated that she received all her vaccines, however they are not documented in the [place].  She also informed that she noticed her diarrhea has been more frequent since she started eating some bread with seeds in it.  The bread has over 20 different seeds in it.  She will stop eating seeds and the bread with the seeds in it to see if that will help with diarrhea.  Patient had no other concern to discuss today.

## 2023-06-28 ENCOUNTER — Inpatient Hospital Stay: Payer: Medicare PPO

## 2023-06-28 ENCOUNTER — Encounter: Payer: Self-pay | Admitting: Hematology and Oncology

## 2023-06-28 ENCOUNTER — Inpatient Hospital Stay: Payer: Medicare PPO | Admitting: Hematology and Oncology

## 2023-06-28 DIAGNOSIS — D61810 Antineoplastic chemotherapy induced pancytopenia: Secondary | ICD-10-CM | POA: Diagnosis not present

## 2023-06-28 DIAGNOSIS — N1832 Chronic kidney disease, stage 3b: Secondary | ICD-10-CM

## 2023-06-28 DIAGNOSIS — D539 Nutritional anemia, unspecified: Secondary | ICD-10-CM | POA: Diagnosis not present

## 2023-06-28 DIAGNOSIS — T451X5A Adverse effect of antineoplastic and immunosuppressive drugs, initial encounter: Secondary | ICD-10-CM | POA: Diagnosis not present

## 2023-06-28 DIAGNOSIS — C9210 Chronic myeloid leukemia, BCR/ABL-positive, not having achieved remission: Secondary | ICD-10-CM | POA: Diagnosis not present

## 2023-06-28 DIAGNOSIS — Z79899 Other long term (current) drug therapy: Secondary | ICD-10-CM | POA: Diagnosis not present

## 2023-06-28 DIAGNOSIS — D638 Anemia in other chronic diseases classified elsewhere: Secondary | ICD-10-CM

## 2023-06-28 LAB — CBC WITH DIFFERENTIAL/PLATELET
Abs Immature Granulocytes: 0.01 10*3/uL (ref 0.00–0.07)
Basophils Absolute: 0 10*3/uL (ref 0.0–0.1)
Basophils Relative: 1 %
Eosinophils Absolute: 0.1 10*3/uL (ref 0.0–0.5)
Eosinophils Relative: 2 %
HCT: 28 % — ABNORMAL LOW (ref 36.0–46.0)
Hemoglobin: 9.4 g/dL — ABNORMAL LOW (ref 12.0–15.0)
Immature Granulocytes: 0 %
Lymphocytes Relative: 23 %
Lymphs Abs: 1 10*3/uL (ref 0.7–4.0)
MCH: 37.5 pg — ABNORMAL HIGH (ref 26.0–34.0)
MCHC: 33.6 g/dL (ref 30.0–36.0)
MCV: 111.6 fL — ABNORMAL HIGH (ref 80.0–100.0)
Monocytes Absolute: 0.6 10*3/uL (ref 0.1–1.0)
Monocytes Relative: 14 %
Neutro Abs: 2.5 10*3/uL (ref 1.7–7.7)
Neutrophils Relative %: 60 %
Platelets: 113 10*3/uL — ABNORMAL LOW (ref 150–400)
RBC: 2.51 MIL/uL — ABNORMAL LOW (ref 3.87–5.11)
RDW: 14.1 % (ref 11.5–15.5)
WBC: 4.1 10*3/uL (ref 4.0–10.5)
nRBC: 0 % (ref 0.0–0.2)

## 2023-06-28 MED ORDER — EPOETIN ALFA-EPBX 20000 UNIT/ML IJ SOLN
20000.0000 [IU] | Freq: Once | INTRAMUSCULAR | Status: AC
  Administered 2023-06-28: 20000 [IU] via SUBCUTANEOUS
  Filled 2023-06-28: qty 1

## 2023-06-28 NOTE — Progress Notes (Signed)
 [facility] OFFICE PROGRESS NOTE  Patient Care Team: Pincus Sanes, MD as PCP - General (Internal Medicine) Rollene Rotunda, MD as PCP - Cardiology (Cardiology) Ollen Gross, MD as Consulting Physician (Orthopedic Surgery) Artis Delay, MD as Consulting Physician (Hematology and Oncology) Mateo Flow, MD as Consulting Physician (Ophthalmology)  Assessment & Plan Chronic myeloid leukemia [facility]) Recent bone marrow biopsy showed no evidence of CML She will continue imatinib and has attained major molecular response In the past, she did not tolerate imatinib discontinuation due to relapse For now, she will continue imatinib indefinitely Typically, she gets monitoring every 6 months Her next blood count monitoring and molecular test for [facility] will be due in May Pancytopenia due to antineoplastic chemotherapy [facility]) Her recent bone marrow biopsy showed no evidence of MDS or cancer infiltration Her blood count actually improved today with erythropoietin stimulating agent The goal will be to get her hemoglobin greater than 10 She will receive another dose of erythropoietin injection today I plan to space out her treatment to every 4 weeks due to good response to treatment  Orders Placed This Encounter  Procedures   BCR-ABL1, CML/ALL, PCR, QUANT    Standing Status:   Future    Expected Date:   09/25/2023    Expiration Date:   06/27/2024   CMP (Cancer Center only)    Standing Status:   Future    Expected Date:   09/25/2023    Expiration Date:   06/27/2024     Artis Delay, MD  INTERVAL HISTORY: she returns for chemo follow-up and EPO inj Complications related to previous cycle of chemotherapy included pancytopenia, She tolerated erythropoietin injection well without side effects Her blood pressure is within normal limits The patient denies any recent signs or symptoms of bleeding such as spontaneous epistaxis, hematuria or hematochezia.   PHYSICAL EXAMINATION: ECOG  PERFORMANCE STATUS: 0 - Asymptomatic  There were no vitals filed for this visit. There were no vitals filed for this visit.  Relevant data reviewed during this visit included CBC  SUMMARY OF ONCOLOGIC HISTORY: Oncology History Overview Note  Repeat Bone marrow biopsy in Dec 2024 neg for malignancy and neg for NGS   Chronic myeloid leukemia (HCC)  02/18/2007 Initial Diagnosis   Chronic myeloid leukemia   08/20/2014 Tumor Marker   BCR/ABL is undetectable   03/04/2015 Pathology Results   BCR/ABL is undetectable   08/29/2015 Tumor Marker   BCR/ABL is undetectable   02/29/2016 Pathology Results   BCR/ABL is undetectable   05/01/2016 Pathology Results   BCR/ABL b2a2 is detected at 0.03% and IS ratio 0.069%   05/31/2016 Pathology Results   BCR/ABL b2a2 is detected at 0.12% and IS ratio 0.276%   06/19/2016 Pathology Results   BCR/ABL b2a2 is detected at 0.24% and IS ratio 0.552%   06/19/2016 Miscellaneous   ABL mutation kinase testing is negative   06/27/2016 Miscellaneous   Dose of Gleevec is increased to 600 mg daily   07/25/2016 Pathology Results   BCR/ABL b2a2 is detected at 0.03% and IS ratio 0.069%. She has achieved MMR   08/30/2016 Miscellaneous   Dose of Gleevec is reduced to 300 mg daily and subsequently down to 200 mg daily   10/22/2016 Pathology Results   BCR/ABL b2a2 is not detected. She has achieved MMR   01/03/2017 Pathology Results   BCR/ABL b2a2 is detected IS ratio 0.041%   02/18/2017 Pathology Results   BCR/ABL b2a2 is detected IS ratio 0.0126   05/21/2017 Pathology Results  BCR/ABL b2a2 is not detected. She has achieved MMR   08/20/2017 Pathology Results   BCR/ABL b2a2 is not detected. She has achieved MMR   11/12/2017 Pathology Results   BCR/ABL b2a2 is not detected   04/07/2018 Pathology Results   BCR/ABL b2a2 is detected IS ratio 0.0062. She is in MMR   06/27/2018 Pathology Results   BCR/ABL b2a2 is detected IS ratio 0.0097%. She is in MMR   08/29/2018  Pathology Results   BCR/ABL is undetectable. She is in MMR   12/26/2018 Pathology Results   BCR/ABL b2a2 is detected IS ratio 0.0042%. She is in MMR   05/04/2019 Pathology Results   BCR/ABL b2a2 is detected IS ratio 0.0575%. She is in MMR   08/03/2019 Pathology Results   BCR/ABL b2a2 is detected IS ratio 0.0167%. She is in MMR   02/01/2020 Pathology Results   BCR/ABL is undetectable   08/01/2020 Pathology Results   BCR/ABL is undetectable   02/28/2021 Pathology Results   BCR/ABL is undetectable   09/04/2021 Pathology Results   BCR/ABL is undetectable   02/28/2022 Pathology Results   BCR/ABL is undetectable    08/31/2022 Pathology Results   BCR/ABL is undetectable    03/04/2023 Pathology Results   BCR/ABL is undetectable    04/09/2023 Bone Marrow Biopsy   Surgical Pathology  CASE: [phone]   DIAGNOSIS:  BONE MARROW, ASPIRATE, CLOT, CORE: -  Essentially normocellular bone marrow with orderly trilineage hematopoiesis, no morphologic evidence of the patient's known chronic myeloid leukemia  PERIPHERAL BLOOD: -  Macrocytic anemia and thrombocytopenia  Note: Clinical history of both CML and early breast cancer is noted. The peripheral blood does show some subtle abnormal nuclear clumping to the neutrophilic lineage; however, this is insufficient for morphologic diagnosis of dysplasia.  The aspirate smear slides show no morphologic evidence of dysplasia.  The biopsy and clot section are essentially normocellular.  There is no morphologic evidence of the patient's known CML (CML and morphologic remission).  The clinical history of breast cancer is noted, and a keratin stain is negative for malignancy.  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  If clinically indicated material is reserved for NexGen sequencing as clinically indicated.  Clinical correlation recommended.

## 2023-06-28 NOTE — Assessment & Plan Note (Addendum)
 Recent bone marrow biopsy showed no evidence of CML She will continue imatinib and has attained major molecular response In the past, she did not tolerate imatinib discontinuation due to relapse For now, she will continue imatinib indefinitely Typically, she gets monitoring every 6 months Her next blood count monitoring and molecular test for [facility] will be due in May

## 2023-06-28 NOTE — Assessment & Plan Note (Addendum)
 Her recent bone marrow biopsy showed no evidence of MDS or cancer infiltration Her blood count actually improved today with erythropoietin stimulating agent The goal will be to get her hemoglobin greater than 10 She will receive another dose of erythropoietin injection today I plan to space out her treatment to every 4 weeks due to good response to treatment

## 2023-07-11 DIAGNOSIS — Z1231 Encounter for screening mammogram for malignant neoplasm of breast: Secondary | ICD-10-CM | POA: Diagnosis not present

## 2023-07-11 LAB — HM MAMMOGRAPHY

## 2023-07-26 ENCOUNTER — Inpatient Hospital Stay: Payer: Medicare PPO

## 2023-07-26 ENCOUNTER — Inpatient Hospital Stay: Payer: Medicare PPO | Admitting: Hematology and Oncology

## 2023-07-26 ENCOUNTER — Inpatient Hospital Stay: Payer: Medicare PPO | Attending: Hematology and Oncology

## 2023-07-26 ENCOUNTER — Other Ambulatory Visit: Payer: Self-pay | Admitting: Hematology and Oncology

## 2023-07-26 VITALS — BP 108/55 | HR 90 | Temp 99.0°F | Resp 18 | Ht 61.0 in | Wt 116.4 lb

## 2023-07-26 DIAGNOSIS — N189 Chronic kidney disease, unspecified: Secondary | ICD-10-CM | POA: Diagnosis not present

## 2023-07-26 DIAGNOSIS — D539 Nutritional anemia, unspecified: Secondary | ICD-10-CM

## 2023-07-26 DIAGNOSIS — D631 Anemia in chronic kidney disease: Secondary | ICD-10-CM | POA: Insufficient documentation

## 2023-07-26 DIAGNOSIS — C9210 Chronic myeloid leukemia, BCR/ABL-positive, not having achieved remission: Secondary | ICD-10-CM

## 2023-07-26 LAB — CBC WITH DIFFERENTIAL/PLATELET
Abs Immature Granulocytes: 0.01 10*3/uL (ref 0.00–0.07)
Basophils Absolute: 0 10*3/uL (ref 0.0–0.1)
Basophils Relative: 1 %
Eosinophils Absolute: 0.1 10*3/uL (ref 0.0–0.5)
Eosinophils Relative: 2 %
HCT: 29.5 % — ABNORMAL LOW (ref 36.0–46.0)
Hemoglobin: 10.1 g/dL — ABNORMAL LOW (ref 12.0–15.0)
Immature Granulocytes: 0 %
Lymphocytes Relative: 27 %
Lymphs Abs: 1 10*3/uL (ref 0.7–4.0)
MCH: 38.5 pg — ABNORMAL HIGH (ref 26.0–34.0)
MCHC: 34.2 g/dL (ref 30.0–36.0)
MCV: 112.6 fL — ABNORMAL HIGH (ref 80.0–100.0)
Monocytes Absolute: 0.4 10*3/uL (ref 0.1–1.0)
Monocytes Relative: 12 %
Neutro Abs: 2.1 10*3/uL (ref 1.7–7.7)
Neutrophils Relative %: 58 %
Platelets: 117 10*3/uL — ABNORMAL LOW (ref 150–400)
RBC: 2.62 MIL/uL — ABNORMAL LOW (ref 3.87–5.11)
RDW: 14.2 % (ref 11.5–15.5)
WBC: 3.6 10*3/uL — ABNORMAL LOW (ref 4.0–10.5)
nRBC: 0 % (ref 0.0–0.2)

## 2023-07-26 NOTE — Assessment & Plan Note (Addendum)
 Recent bone marrow biopsy showed no evidence of CML She will continue imatinib and has attained major molecular response In the past, she did not tolerate imatinib discontinuation due to relapse For now, she will continue imatinib indefinitely Typically, she gets monitoring every 4-6 months Her next blood count monitoring and molecular test for [facility] will be due in April

## 2023-07-26 NOTE — Assessment & Plan Note (Addendum)
 She has multifactorial anemia, anemia of chronic kidney disease and related to side effects of imatinib She has received erythropoietin injection recently with good response Hemoglobin is greater than 10 We will cancel injection today I will see her again in 4 weeks for further follow-up

## 2023-07-26 NOTE — Progress Notes (Signed)
 [facility] OFFICE PROGRESS NOTE  Burns, Bobette Mo, MD  ASSESSMENT & PLAN:  Assessment & Plan Chronic myeloid leukemia (HCC) Recent bone marrow biopsy showed no evidence of CML She will continue imatinib and has attained major molecular response In the past, she did not tolerate imatinib discontinuation due to relapse For now, she will continue imatinib indefinitely Typically, she gets monitoring every 4-6 months Her next blood count monitoring and molecular test for CML will be due in April Deficiency anemia She has multifactorial anemia, anemia of chronic kidney disease and related to side effects of imatinib She has received erythropoietin injection recently with good response Hemoglobin is greater than 10 We will cancel injection today I will see her again in 4 weeks for further follow-up    No orders of the defined types were placed in this encounter.   INTERVAL HISTORY: Patient returns for recurrent anemia Symptoms of anemia includes none We reviewed CBC today  SUMMARY OF HEMATOLOGIC HISTORY: Oncology History Overview Note  Repeat Bone marrow biopsy in Dec 2024 neg for malignancy and neg for NGS   Chronic myeloid leukemia (HCC)  02/18/2007 Initial Diagnosis   Chronic myeloid leukemia   08/20/2014 Tumor Marker   BCR/ABL is undetectable   03/04/2015 Pathology Results   BCR/ABL is undetectable   08/29/2015 Tumor Marker   BCR/ABL is undetectable   02/29/2016 Pathology Results   BCR/ABL is undetectable   05/01/2016 Pathology Results   BCR/ABL b2a2 is detected at 0.03% and IS ratio 0.069%   05/31/2016 Pathology Results   BCR/ABL b2a2 is detected at 0.12% and IS ratio 0.276%   06/19/2016 Pathology Results   BCR/ABL b2a2 is detected at 0.24% and IS ratio 0.552%   06/19/2016 Miscellaneous   ABL mutation kinase testing is negative   06/27/2016 Miscellaneous   Dose of Gleevec is increased to 600 mg daily   07/25/2016 Pathology Results   BCR/ABL b2a2 is  detected at 0.03% and IS ratio 0.069%. She has achieved MMR   08/30/2016 Miscellaneous   Dose of Gleevec is reduced to 300 mg daily and subsequently down to 200 mg daily   10/22/2016 Pathology Results   BCR/ABL b2a2 is not detected. She has achieved MMR   01/03/2017 Pathology Results   BCR/ABL b2a2 is detected IS ratio 0.041%   02/18/2017 Pathology Results   BCR/ABL b2a2 is detected IS ratio 0.0126   05/21/2017 Pathology Results   BCR/ABL b2a2 is not detected. She has achieved MMR   08/20/2017 Pathology Results   BCR/ABL b2a2 is not detected. She has achieved MMR   11/12/2017 Pathology Results   BCR/ABL b2a2 is not detected   04/07/2018 Pathology Results   BCR/ABL b2a2 is detected IS ratio 0.0062. She is in MMR   06/27/2018 Pathology Results   BCR/ABL b2a2 is detected IS ratio 0.0097%. She is in MMR   08/29/2018 Pathology Results   BCR/ABL is undetectable. She is in MMR   12/26/2018 Pathology Results   BCR/ABL b2a2 is detected IS ratio 0.0042%. She is in MMR   05/04/2019 Pathology Results   BCR/ABL b2a2 is detected IS ratio 0.0575%. She is in MMR   08/03/2019 Pathology Results   BCR/ABL b2a2 is detected IS ratio 0.0167%. She is in MMR   02/01/2020 Pathology Results   BCR/ABL is undetectable   08/01/2020 Pathology Results   BCR/ABL is undetectable   02/28/2021 Pathology Results   BCR/ABL is undetectable   09/04/2021 Pathology Results   BCR/ABL is undetectable  02/28/2022 Pathology Results   BCR/ABL is undetectable    08/31/2022 Pathology Results   BCR/ABL is undetectable    03/04/2023 Pathology Results   BCR/ABL is undetectable    04/09/2023 Bone Marrow Biopsy   Surgical Pathology  CASE: [id number]   DIAGNOSIS:  BONE MARROW, ASPIRATE, CLOT, CORE: -  Essentially normocellular bone marrow with orderly trilineage hematopoiesis, no morphologic evidence of the patient's known chronic myeloid leukemia  PERIPHERAL BLOOD: -  Macrocytic anemia and thrombocytopenia  Note:  Clinical history of both CML and early breast cancer is noted. The peripheral blood does show some subtle abnormal nuclear clumping to the neutrophilic lineage; however, this is insufficient for morphologic diagnosis of dysplasia.  The aspirate smear slides show no morphologic evidence of dysplasia.  The biopsy and clot section are essentially normocellular.  There is no morphologic evidence of the patient's known CML (CML and morphologic remission).  The clinical history of breast cancer is noted, and a keratin stain is negative for malignancy.  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  If clinically indicated material is reserved for NexGen sequencing as clinically indicated.  Clinical correlation recommended.       Vitals:   07/26/23 1150  BP: (!) 108/55  Pulse: 90  Resp: 18  Temp: 99 F (37.2 C)  SpO2: 100%

## 2023-08-20 ENCOUNTER — Ambulatory Visit (INDEPENDENT_AMBULATORY_CARE_PROVIDER_SITE_OTHER)
Admission: RE | Admit: 2023-08-20 | Discharge: 2023-08-20 | Disposition: A | Discharge status: Home / Self Care | Source: Ambulatory Visit | Attending: Internal Medicine | Admitting: Internal Medicine

## 2023-08-20 DIAGNOSIS — Z780 Asymptomatic menopausal state: Secondary | ICD-10-CM

## 2023-08-22 ENCOUNTER — Encounter: Payer: Self-pay | Admitting: Internal Medicine

## 2023-08-23 ENCOUNTER — Inpatient Hospital Stay: Attending: Hematology and Oncology

## 2023-08-23 ENCOUNTER — Encounter: Payer: Self-pay | Admitting: Hematology and Oncology

## 2023-08-23 ENCOUNTER — Inpatient Hospital Stay

## 2023-08-23 ENCOUNTER — Inpatient Hospital Stay: Admitting: Hematology and Oncology

## 2023-08-23 VITALS — BP 120/70 | HR 78 | Temp 97.9°F | Resp 18 | Ht 61.0 in | Wt 115.8 lb

## 2023-08-23 DIAGNOSIS — N1832 Chronic kidney disease, stage 3b: Secondary | ICD-10-CM

## 2023-08-23 DIAGNOSIS — C9210 Chronic myeloid leukemia, BCR/ABL-positive, not having achieved remission: Secondary | ICD-10-CM | POA: Diagnosis not present

## 2023-08-23 DIAGNOSIS — D61810 Antineoplastic chemotherapy induced pancytopenia: Secondary | ICD-10-CM | POA: Diagnosis not present

## 2023-08-23 DIAGNOSIS — T451X5A Adverse effect of antineoplastic and immunosuppressive drugs, initial encounter: Secondary | ICD-10-CM | POA: Diagnosis not present

## 2023-08-23 DIAGNOSIS — Z79899 Other long term (current) drug therapy: Secondary | ICD-10-CM | POA: Insufficient documentation

## 2023-08-23 DIAGNOSIS — D638 Anemia in other chronic diseases classified elsewhere: Secondary | ICD-10-CM

## 2023-08-23 LAB — CBC WITH DIFFERENTIAL/PLATELET
Abs Immature Granulocytes: 0 10*3/uL (ref 0.00–0.07)
Basophils Absolute: 0 10*3/uL (ref 0.0–0.1)
Basophils Relative: 1 %
Eosinophils Absolute: 0.1 10*3/uL (ref 0.0–0.5)
Eosinophils Relative: 2 %
HCT: 27.6 % — ABNORMAL LOW (ref 36.0–46.0)
Hemoglobin: 9.6 g/dL — ABNORMAL LOW (ref 12.0–15.0)
Immature Granulocytes: 0 %
Lymphocytes Relative: 27 %
Lymphs Abs: 1 10*3/uL (ref 0.7–4.0)
MCH: 37.6 pg — ABNORMAL HIGH (ref 26.0–34.0)
MCHC: 34.8 g/dL (ref 30.0–36.0)
MCV: 108.2 fL — ABNORMAL HIGH (ref 80.0–100.0)
Monocytes Absolute: 0.4 10*3/uL (ref 0.1–1.0)
Monocytes Relative: 12 %
Neutro Abs: 2.1 10*3/uL (ref 1.7–7.7)
Neutrophils Relative %: 58 %
Platelets: 116 10*3/uL — ABNORMAL LOW (ref 150–400)
RBC: 2.55 MIL/uL — ABNORMAL LOW (ref 3.87–5.11)
RDW: 14.2 % (ref 11.5–15.5)
WBC: 3.6 10*3/uL — ABNORMAL LOW (ref 4.0–10.5)
nRBC: 0 % (ref 0.0–0.2)

## 2023-08-23 LAB — CMP (CANCER CENTER ONLY)
ALT: 18 U/L (ref 0–44)
AST: 36 U/L (ref 15–41)
Albumin: 4.2 g/dL (ref 3.5–5.0)
Alkaline Phosphatase: 49 U/L (ref 38–126)
Anion gap: 3 — ABNORMAL LOW (ref 5–15)
BUN: 31 mg/dL — ABNORMAL HIGH (ref 8–23)
CO2: 32 mmol/L (ref 22–32)
Calcium: 9.5 mg/dL (ref 8.9–10.3)
Chloride: 105 mmol/L (ref 98–111)
Creatinine: 1.44 mg/dL — ABNORMAL HIGH (ref 0.44–1.00)
GFR, Estimated: 35 mL/min — ABNORMAL LOW (ref >60)
Glucose, Bld: 92 mg/dL (ref 70–99)
Potassium: 4.5 mmol/L (ref 3.5–5.1)
Sodium: 140 mmol/L (ref 135–145)
Total Bilirubin: 0.6 mg/dL (ref 0.0–1.2)
Total Protein: 6.6 g/dL (ref 6.5–8.1)

## 2023-08-23 MED ORDER — EPOETIN ALFA-EPBX 10000 UNIT/ML IJ SOLN
20000.0000 [IU] | Freq: Once | INTRAMUSCULAR | Status: AC
  Administered 2023-08-23: 20000 [IU] via SUBCUTANEOUS
  Filled 2023-08-23: qty 2

## 2023-08-23 NOTE — Progress Notes (Signed)
 [facility] OFFICE PROGRESS NOTE  Patient Care Team: Colene Dauphin, MD as PCP - General (Internal Medicine) Eilleen Grates, MD as PCP - Cardiology (Cardiology) Liliane Rei, MD as Consulting Physician (Orthopedic Surgery) Almeda Jacobs, MD as Consulting Physician (Hematology and Oncology) Amedeo Jupiter, MD as Consulting Physician (Ophthalmology)  Assessment & Plan Chronic myeloid leukemia [facility]) Recent bone marrow biopsy showed no evidence of CML She will continue imatinib  and has attained major molecular response In the past, she did not tolerate imatinib  discontinuation due to relapse For now, she will continue imatinib  indefinitely Typically, she gets monitoring every 4-6 months, result is pending from today Pancytopenia due to antineoplastic chemotherapy [facility]) Her recent bone marrow biopsy showed no evidence of MDS or cancer infiltration Her blood count actually improved today with erythropoietin  stimulating agent The goal will be to get her hemoglobin greater than 10 She will receive another dose of erythropoietin  injection today I plan to space out her treatment to every 8 weeks due to good response to treatment Stage 3b chronic kidney disease (HCC) Her serum creatinine fluctuated a little bit She is not symptomatic Observe for now  No orders of the defined types were placed in this encounter.    Almeda Jacobs, MD  INTERVAL HISTORY: she returns for treatment follow-up for anemia due to chronic illness and CML She denies side effects from recent treatment No recent infection I addressed the questions again related to causes of anemia and we discussed the role of erythropoietin  stimulating agent  PHYSICAL EXAMINATION: ECOG PERFORMANCE STATUS: 0 - Asymptomatic  Vitals:   08/23/23 1112  BP: 120/70  Pulse: 78  Resp: 18  Temp: 97.9 F (36.6 C)  SpO2: 100%   Filed Weights   08/23/23 1112  Weight: 115 lb 12.8 oz (52.5 kg)    Relevant data reviewed  during this visit included CBC

## 2023-08-23 NOTE — Assessment & Plan Note (Addendum)
Her serum creatinine fluctuated a little bit She is not symptomatic Observe for now 

## 2023-08-23 NOTE — Assessment & Plan Note (Addendum)
 Recent bone marrow biopsy showed no evidence of CML She will continue imatinib  and has attained major molecular response In the past, she did not tolerate imatinib  discontinuation due to relapse For now, she will continue imatinib  indefinitely Typically, she gets monitoring every 4-6 months, result is pending from today

## 2023-08-23 NOTE — Assessment & Plan Note (Addendum)
 Her recent bone marrow biopsy showed no evidence of MDS or cancer infiltration Her blood count actually improved today with erythropoietin  stimulating agent The goal will be to get her hemoglobin greater than 10 She will receive another dose of erythropoietin  injection today I plan to space out her treatment to every 8 weeks due to good response to treatment

## 2023-08-29 LAB — BCR-ABL1, CML/ALL, PCR, QUANT
E1A2 Transcript: <0.0032 %
Interpretation (BCRAL):: NEGATIVE
b2a2 transcript: <0.0032 %
b3a2 transcript: <0.0032 %

## 2023-09-30 DIAGNOSIS — L821 Other seborrheic keratosis: Secondary | ICD-10-CM | POA: Diagnosis not present

## 2023-09-30 DIAGNOSIS — D1801 Hemangioma of skin and subcutaneous tissue: Secondary | ICD-10-CM | POA: Diagnosis not present

## 2023-09-30 DIAGNOSIS — D2262 Melanocytic nevi of left upper limb, including shoulder: Secondary | ICD-10-CM | POA: Diagnosis not present

## 2023-09-30 DIAGNOSIS — D225 Melanocytic nevi of trunk: Secondary | ICD-10-CM | POA: Diagnosis not present

## 2023-09-30 DIAGNOSIS — D2261 Melanocytic nevi of right upper limb, including shoulder: Secondary | ICD-10-CM | POA: Diagnosis not present

## 2023-10-02 ENCOUNTER — Other Ambulatory Visit (HOSPITAL_COMMUNITY): Payer: Self-pay

## 2023-10-02 ENCOUNTER — Encounter: Payer: Self-pay | Admitting: Hematology and Oncology

## 2023-10-02 ENCOUNTER — Other Ambulatory Visit: Payer: Self-pay | Admitting: Hematology and Oncology

## 2023-10-02 DIAGNOSIS — C9210 Chronic myeloid leukemia, BCR/ABL-positive, not having achieved remission: Secondary | ICD-10-CM

## 2023-10-02 NOTE — Telephone Encounter (Addendum)
 Error

## 2023-10-24 ENCOUNTER — Inpatient Hospital Stay: Attending: Hematology and Oncology

## 2023-10-24 ENCOUNTER — Encounter: Payer: Self-pay | Admitting: Hematology and Oncology

## 2023-10-24 ENCOUNTER — Inpatient Hospital Stay: Admitting: Hematology and Oncology

## 2023-10-24 VITALS — BP 123/59 | HR 71 | Temp 97.6°F | Resp 18 | Ht 61.0 in | Wt 118.8 lb

## 2023-10-24 DIAGNOSIS — D6959 Other secondary thrombocytopenia: Secondary | ICD-10-CM

## 2023-10-24 DIAGNOSIS — D631 Anemia in chronic kidney disease: Secondary | ICD-10-CM | POA: Diagnosis not present

## 2023-10-24 DIAGNOSIS — D696 Thrombocytopenia, unspecified: Secondary | ICD-10-CM | POA: Diagnosis not present

## 2023-10-24 DIAGNOSIS — C9210 Chronic myeloid leukemia, BCR/ABL-positive, not having achieved remission: Secondary | ICD-10-CM

## 2023-10-24 DIAGNOSIS — Z79899 Other long term (current) drug therapy: Secondary | ICD-10-CM | POA: Insufficient documentation

## 2023-10-24 DIAGNOSIS — T50905A Adverse effect of unspecified drugs, medicaments and biological substances, initial encounter: Secondary | ICD-10-CM | POA: Diagnosis not present

## 2023-10-24 DIAGNOSIS — N1832 Chronic kidney disease, stage 3b: Secondary | ICD-10-CM

## 2023-10-24 DIAGNOSIS — D539 Nutritional anemia, unspecified: Secondary | ICD-10-CM

## 2023-10-24 LAB — CBC WITH DIFFERENTIAL/PLATELET
Abs Immature Granulocytes: 0 10*3/uL (ref 0.00–0.07)
Basophils Absolute: 0 10*3/uL (ref 0.0–0.1)
Basophils Relative: 1 %
Eosinophils Absolute: 0.1 10*3/uL (ref 0.0–0.5)
Eosinophils Relative: 4 %
HCT: 26.6 % — ABNORMAL LOW (ref 36.0–46.0)
Hemoglobin: 9.3 g/dL — ABNORMAL LOW (ref 12.0–15.0)
Immature Granulocytes: 0 %
Lymphocytes Relative: 23 %
Lymphs Abs: 0.9 10*3/uL (ref 0.7–4.0)
MCH: 38.1 pg — ABNORMAL HIGH (ref 26.0–34.0)
MCHC: 35 g/dL (ref 30.0–36.0)
MCV: 109 fL — ABNORMAL HIGH (ref 80.0–100.0)
Monocytes Absolute: 0.6 10*3/uL (ref 0.1–1.0)
Monocytes Relative: 14 %
Neutro Abs: 2.4 10*3/uL (ref 1.7–7.7)
Neutrophils Relative %: 58 %
Platelets: 127 10*3/uL — ABNORMAL LOW (ref 150–400)
RBC: 2.44 MIL/uL — ABNORMAL LOW (ref 3.87–5.11)
RDW: 14 % (ref 11.5–15.5)
WBC: 4 10*3/uL (ref 4.0–10.5)
nRBC: 0 % (ref 0.0–0.2)

## 2023-10-24 NOTE — Assessment & Plan Note (Addendum)
 Recent bone marrow biopsy showed no evidence of CML She will continue imatinib  and has attained major molecular response In the past, she did not tolerate imatinib  discontinuation due to relapse For now, she will continue imatinib  indefinitely I plan to recheck her labs again in September

## 2023-10-24 NOTE — Assessment & Plan Note (Addendum)
This is likely due to recent treatment. The patient denies recent history of bleeding such as epistaxis, hematuria or hematochezia. She is asymptomatic from the low platelet count. I will observe for now.  she does not require transfusion now. I will continue the chemotherapy at current dose without dosage adjustment.  If the thrombocytopenia gets progressive worse in the future, I might have to delay her treatment or adjust the chemotherapy dose.   

## 2023-10-24 NOTE — Progress Notes (Signed)
 [facility] OFFICE PROGRESS NOTE  Patient Care Team: Geofm Glade PARAS, MD as PCP - General (Internal Medicine) Lavona Agent, MD as PCP - Cardiology (Cardiology) Melodi Lerner, MD as Consulting Physician (Orthopedic Surgery) Lonn Hicks, MD as Consulting Physician (Hematology and Oncology) Cleatus Collar, MD as Consulting Physician (Ophthalmology)  Assessment & Plan Chronic myeloid leukemia [facility]) Recent bone marrow biopsy showed no evidence of CML She will continue imatinib  and has attained major molecular response In the past, she did not tolerate imatinib  discontinuation due to relapse For now, she will continue imatinib  indefinitely I plan to recheck her labs again in September Thrombocytopenia due to drugs This is likely due to recent treatment. The patient denies recent history of bleeding such as epistaxis, hematuria or hematochezia. She is asymptomatic from the low platelet count. I will observe for now.  she does not require transfusion now. I will continue the chemotherapy at current dose without dosage adjustment.  If the thrombocytopenia gets progressive worse in the future, I might have to delay her treatment or adjust the chemotherapy dose.   Deficiency anemia She has multifactorial anemia, anemia of chronic kidney disease and related to side effects of imatinib  She has received erythropoietin  injection recently with good response The patient is not symptomatic from the anemia and we have made informed decision to stop injection, as long as her hemoglobin is above the 9 range  Orders Placed This Encounter  Procedures   CMP (Cancer Center only)    Standing Status:   Standing    Number of Occurrences:   33    Expiration Date:   10/23/2024   BCR-ABL1, CML/ALL, PCR, QUANT    Standing Status:   Standing    Number of Occurrences:   22    Expiration Date:   10/23/2024     Hicks Lonn, MD  INTERVAL HISTORY: she returns for treatment follow-up on  imatinib  Complications related to previous cycle of chemotherapy included pancytopenia,  PHYSICAL EXAMINATION: ECOG PERFORMANCE STATUS: 0 - Asymptomatic  No results found for: [id number]    Latest Ref Rng & Units 10/24/2023   10:25 AM 08/23/2023   10:51 AM 07/26/2023   11:31 AM  CBC  WBC 4.0 - 10.5 K/uL 4.0  3.6  3.6   Hemoglobin 12.0 - 15.0 g/dL 9.3  9.6  89.8   Hematocrit 36.0 - 46.0 % 26.6  27.6  29.5   Platelets 150 - 400 K/uL 127  116  117       Chemistry      Component Value Date/Time   NA 140 08/23/2023 1051   NA 140 02/18/2017 0808   K 4.5 08/23/2023 1051   K 4.4 02/18/2017 0808   CL 105 08/23/2023 1051   CL 105 09/04/2012 1006   CO2 32 08/23/2023 1051   CO2 29 02/18/2017 0808   BUN 31 (H) 08/23/2023 1051   BUN 20.3 02/18/2017 0808   CREATININE 1.44 (H) 08/23/2023 1051   CREATININE 1.1 02/18/2017 0808      Component Value Date/Time   CALCIUM 9.5 08/23/2023 1051   CALCIUM 9.4 02/18/2017 0808   ALKPHOS 49 08/23/2023 1051   ALKPHOS 53 02/18/2017 0808   AST 36 08/23/2023 1051   AST 34 02/18/2017 0808   ALT 18 08/23/2023 1051   ALT 18 02/18/2017 0808   BILITOT 0.6 08/23/2023 1051   BILITOT 0.76 02/18/2017 0808       Vitals:   10/24/23 1044  BP: (!) 123/59  Pulse: 71  Resp: 18  Temp: 97.6 F (36.4 C)  SpO2: 100%   Filed Weights   10/24/23 1044  Weight: 118 lb 12.8 oz (53.9 kg)   Other relevant data reviewed during this visit included CBC, BCR/ABL Oncology History Overview Note  Repeat Bone marrow biopsy in Dec 2024 neg for malignancy and neg for NGS   Chronic myeloid leukemia (HCC)  02/18/2007 Initial Diagnosis   Chronic myeloid leukemia   08/20/2014 Tumor Marker   BCR/ABL is undetectable   03/04/2015 Pathology Results   BCR/ABL is undetectable   08/29/2015 Tumor Marker   BCR/ABL is undetectable   02/29/2016 Pathology Results   BCR/ABL is undetectable   05/01/2016 Pathology Results   BCR/ABL b2a2 is detected at 0.03% and IS ratio 0.069%    05/31/2016 Pathology Results   BCR/ABL b2a2 is detected at 0.12% and IS ratio 0.276%   06/19/2016 Pathology Results   BCR/ABL b2a2 is detected at 0.24% and IS ratio 0.552%   06/19/2016 Miscellaneous   ABL mutation kinase testing is negative   06/27/2016 Miscellaneous   Dose of Gleevec  is increased to 600 mg daily   07/25/2016 Pathology Results   BCR/ABL b2a2 is detected at 0.03% and IS ratio 0.069%. She has achieved MMR   08/30/2016 Miscellaneous   Dose of Gleevec  is reduced to 300 mg daily and subsequently down to 200 mg daily   10/22/2016 Pathology Results   BCR/ABL b2a2 is not detected. She has achieved MMR   01/03/2017 Pathology Results   BCR/ABL b2a2 is detected IS ratio 0.041%   02/18/2017 Pathology Results   BCR/ABL b2a2 is detected IS ratio 0.0126   05/21/2017 Pathology Results   BCR/ABL b2a2 is not detected. She has achieved MMR   08/20/2017 Pathology Results   BCR/ABL b2a2 is not detected. She has achieved MMR   11/12/2017 Pathology Results   BCR/ABL b2a2 is not detected   04/07/2018 Pathology Results   BCR/ABL b2a2 is detected IS ratio 0.0062. She is in MMR   06/27/2018 Pathology Results   BCR/ABL b2a2 is detected IS ratio 0.0097%. She is in MMR   08/29/2018 Pathology Results   BCR/ABL is undetectable. She is in MMR   12/26/2018 Pathology Results   BCR/ABL b2a2 is detected IS ratio 0.0042%. She is in MMR   05/04/2019 Pathology Results   BCR/ABL b2a2 is detected IS ratio 0.0575%. She is in MMR   08/03/2019 Pathology Results   BCR/ABL b2a2 is detected IS ratio 0.0167%. She is in MMR   02/01/2020 Pathology Results   BCR/ABL is undetectable   08/01/2020 Pathology Results   BCR/ABL is undetectable   02/28/2021 Pathology Results   BCR/ABL is undetectable   09/04/2021 Pathology Results   BCR/ABL is undetectable   02/28/2022 Pathology Results   BCR/ABL is undetectable    08/31/2022 Pathology Results   BCR/ABL is undetectable    03/04/2023 Pathology Results   BCR/ABL is  undetectable    04/09/2023 Bone Marrow Biopsy   Surgical Pathology  CASE: [phone]   DIAGNOSIS:  BONE MARROW, ASPIRATE, CLOT, CORE: -  Essentially normocellular bone marrow with orderly trilineage hematopoiesis, no morphologic evidence of the patient's known chronic myeloid leukemia  PERIPHERAL BLOOD: -  Macrocytic anemia and thrombocytopenia  Note: Clinical history of both CML and early breast cancer is noted. The peripheral blood does show some subtle abnormal nuclear clumping to the neutrophilic lineage; however, this is insufficient for morphologic diagnosis of dysplasia.  The aspirate smear slides show no morphologic evidence of dysplasia.  The biopsy and clot section are essentially normocellular.  There is no morphologic evidence of the patient's known CML (CML and morphologic remission).  The clinical history of breast cancer is noted, and a keratin stain is negative for malignancy.  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  If clinically indicated material is reserved for NexGen sequencing as clinically indicated.  Clinical correlation recommended.    08/22/2023 Pathology Results   BCR/ABL is undetectable

## 2023-10-24 NOTE — Assessment & Plan Note (Addendum)
 She has multifactorial anemia, anemia of chronic kidney disease and related to side effects of imatinib  She has received erythropoietin  injection recently with good response The patient is not symptomatic from the anemia and we have made informed decision to stop injection, as long as her hemoglobin is above the 9 range

## 2023-11-26 DIAGNOSIS — N952 Postmenopausal atrophic vaginitis: Secondary | ICD-10-CM | POA: Diagnosis not present

## 2023-11-26 DIAGNOSIS — L900 Lichen sclerosus et atrophicus: Secondary | ICD-10-CM | POA: Diagnosis not present

## 2023-11-26 DIAGNOSIS — N3941 Urge incontinence: Secondary | ICD-10-CM | POA: Diagnosis not present

## 2023-11-26 DIAGNOSIS — Z1331 Encounter for screening for depression: Secondary | ICD-10-CM | POA: Diagnosis not present

## 2023-11-26 DIAGNOSIS — Z779 Other contact with and (suspected) exposures hazardous to health: Secondary | ICD-10-CM | POA: Diagnosis not present

## 2023-12-11 DIAGNOSIS — H353132 Nonexudative age-related macular degeneration, bilateral, intermediate dry stage: Secondary | ICD-10-CM | POA: Diagnosis not present

## 2023-12-11 LAB — HM DIABETES EYE EXAM

## 2024-01-14 ENCOUNTER — Inpatient Hospital Stay: Attending: Hematology and Oncology

## 2024-01-14 DIAGNOSIS — C9210 Chronic myeloid leukemia, BCR/ABL-positive, not having achieved remission: Secondary | ICD-10-CM | POA: Diagnosis not present

## 2024-01-14 DIAGNOSIS — T451X5A Adverse effect of antineoplastic and immunosuppressive drugs, initial encounter: Secondary | ICD-10-CM | POA: Insufficient documentation

## 2024-01-14 DIAGNOSIS — D61810 Antineoplastic chemotherapy induced pancytopenia: Secondary | ICD-10-CM | POA: Diagnosis not present

## 2024-01-14 DIAGNOSIS — Z79899 Other long term (current) drug therapy: Secondary | ICD-10-CM | POA: Insufficient documentation

## 2024-01-14 DIAGNOSIS — N1832 Chronic kidney disease, stage 3b: Secondary | ICD-10-CM | POA: Insufficient documentation

## 2024-01-14 LAB — CBC WITH DIFFERENTIAL/PLATELET
Abs Immature Granulocytes: 0.01 K/uL (ref 0.00–0.07)
Basophils Absolute: 0 K/uL (ref 0.0–0.1)
Basophils Relative: 0 %
Eosinophils Absolute: 0.1 K/uL (ref 0.0–0.5)
Eosinophils Relative: 2 %
HCT: 26.4 % — ABNORMAL LOW (ref 36.0–46.0)
Hemoglobin: 9.1 g/dL — ABNORMAL LOW (ref 12.0–15.0)
Immature Granulocytes: 0 %
Lymphocytes Relative: 29 %
Lymphs Abs: 1.4 K/uL (ref 0.7–4.0)
MCH: 37.9 pg — ABNORMAL HIGH (ref 26.0–34.0)
MCHC: 34.5 g/dL (ref 30.0–36.0)
MCV: 110 fL — ABNORMAL HIGH (ref 80.0–100.0)
Monocytes Absolute: 0.5 K/uL (ref 0.1–1.0)
Monocytes Relative: 11 %
Neutro Abs: 2.7 K/uL (ref 1.7–7.7)
Neutrophils Relative %: 58 %
Platelets: 130 K/uL — ABNORMAL LOW (ref 150–400)
RBC: 2.4 MIL/uL — ABNORMAL LOW (ref 3.87–5.11)
RDW: 13.3 % (ref 11.5–15.5)
WBC: 4.7 K/uL (ref 4.0–10.5)
nRBC: 0 % (ref 0.0–0.2)

## 2024-01-14 LAB — CMP (CANCER CENTER ONLY)
ALT: 17 U/L (ref 0–44)
AST: 36 U/L (ref 15–41)
Albumin: 4.2 g/dL (ref 3.5–5.0)
Alkaline Phosphatase: 65 U/L (ref 38–126)
Anion gap: 2 — ABNORMAL LOW (ref 5–15)
BUN: 34 mg/dL — ABNORMAL HIGH (ref 8–23)
CO2: 32 mmol/L (ref 22–32)
Calcium: 9.6 mg/dL (ref 8.9–10.3)
Chloride: 105 mmol/L (ref 98–111)
Creatinine: 1.5 mg/dL — ABNORMAL HIGH (ref 0.44–1.00)
GFR, Estimated: 34 mL/min — ABNORMAL LOW (ref >60)
Glucose, Bld: 104 mg/dL — ABNORMAL HIGH (ref 70–99)
Potassium: 4.9 mmol/L (ref 3.5–5.1)
Sodium: 139 mmol/L (ref 135–145)
Total Bilirubin: 0.4 mg/dL (ref 0.0–1.2)
Total Protein: 6.6 g/dL (ref 6.5–8.1)

## 2024-01-18 LAB — BCR-ABL1, CML/ALL, PCR, QUANT
E1A2 Transcript: <0.0032 %
Interpretation (BCRAL):: NEGATIVE
b2a2 transcript: <0.0032 %
b3a2 transcript: <0.0032 %

## 2024-01-24 ENCOUNTER — Inpatient Hospital Stay: Admitting: Hematology and Oncology

## 2024-01-24 ENCOUNTER — Encounter: Payer: Self-pay | Admitting: Hematology and Oncology

## 2024-01-24 VITALS — BP 133/72 | HR 92 | Temp 97.8°F | Resp 17 | Wt 117.6 lb

## 2024-01-24 DIAGNOSIS — N1832 Chronic kidney disease, stage 3b: Secondary | ICD-10-CM | POA: Diagnosis not present

## 2024-01-24 DIAGNOSIS — C9210 Chronic myeloid leukemia, BCR/ABL-positive, not having achieved remission: Secondary | ICD-10-CM | POA: Diagnosis not present

## 2024-01-24 DIAGNOSIS — D61810 Antineoplastic chemotherapy induced pancytopenia: Secondary | ICD-10-CM | POA: Diagnosis not present

## 2024-01-24 DIAGNOSIS — Z79899 Other long term (current) drug therapy: Secondary | ICD-10-CM | POA: Diagnosis not present

## 2024-01-24 DIAGNOSIS — T451X5A Adverse effect of antineoplastic and immunosuppressive drugs, initial encounter: Secondary | ICD-10-CM | POA: Diagnosis not present

## 2024-01-24 NOTE — Progress Notes (Signed)
 [facility] OFFICE PROGRESS NOTE  Patient Care Team: Geofm Glade PARAS, MD as PCP - General (Internal Medicine) Lavona Agent, MD as PCP - Cardiology (Cardiology) Melodi Lerner, MD as Consulting Physician (Orthopedic Surgery) Lonn Hicks, MD as Consulting Physician (Hematology and Oncology) Cleatus Collar, MD as Consulting Physician (Ophthalmology)  Assessment & Plan Chronic myeloid leukemia [facility]) The patient wasCML since 2008 She was placed on imatinib  with major molecular response She had a trial of discontinuation of imatinib  in 2018 but had relapse and was placed back on imatinib  with complete response She had bone marrow biopsy done in 2024 due to progressive pancytopenia which showed no evidence of CML She will continue imatinib  and has attained major molecular response  For now, she will continue imatinib  indefinitely I plan to recheck her labs again in 3 months Pancytopenia due to antineoplastic chemotherapy Bone marrow biopsy in 2024 showed no evidence of MDS or cancer infiltration Multifactorial, could be due to CML and occasional alcohol intake  She received 3 doses of erythropoietin  stimulating agent with stabilization of her blood count and she has not needed it for over 5 months Observe closely for now Stage 3b chronic kidney disease (HCC) Her serum creatinine fluctuated a little bit She is not symptomatic Recommend increase oral fluid hydration  No orders of the defined types were placed in this encounter.    Hicks Lonn, MD  INTERVAL HISTORY: she returns for treatment follow-up on Gleevec  for CML Complications related to previous cycle of chemotherapy included pancytopenia, and elevated serum creatinine Overall, she tolerated treatment very well  PHYSICAL EXAMINATION: ECOG PERFORMANCE STATUS: 0 - Asymptomatic  No results found for: [id number]    Latest Ref Rng & Units 01/14/2024    1:14 PM 10/24/2023   10:25 AM 08/23/2023   10:51 AM  CBC   WBC 4.0 - 10.5 K/uL 4.7  4.0  3.6   Hemoglobin 12.0 - 15.0 g/dL 9.1  9.3  9.6   Hematocrit 36.0 - 46.0 % 26.4  26.6  27.6   Platelets 150 - 400 K/uL 130  127  116       Chemistry      Component Value Date/Time   NA 139 01/14/2024 1314   NA 140 02/18/2017 0808   K 4.9 01/14/2024 1314   K 4.4 02/18/2017 0808   CL 105 01/14/2024 1314   CL 105 09/04/2012 1006   CO2 32 01/14/2024 1314   CO2 29 02/18/2017 0808   BUN 34 (H) 01/14/2024 1314   BUN 20.3 02/18/2017 0808   CREATININE 1.50 (H) 01/14/2024 1314   CREATININE 1.1 02/18/2017 0808      Component Value Date/Time   CALCIUM 9.6 01/14/2024 1314   CALCIUM 9.4 02/18/2017 0808   ALKPHOS 65 01/14/2024 1314   ALKPHOS 53 02/18/2017 0808   AST 36 01/14/2024 1314   AST 34 02/18/2017 0808   ALT 17 01/14/2024 1314   ALT 18 02/18/2017 0808   BILITOT 0.4 01/14/2024 1314   BILITOT 0.76 02/18/2017 0808       Vitals:   01/24/24 1251  BP: 133/72  Pulse: 92  Resp: 17  Temp: 97.8 F (36.6 C)  SpO2: 98%   Filed Weights   01/24/24 1251  Weight: 117 lb 9.6 oz (53.3 kg)   Other relevant data reviewed during this visit included CBC, CMP, BCR/ABL

## 2024-01-24 NOTE — Assessment & Plan Note (Addendum)
 Bone marrow biopsy in 2024 showed no evidence of MDS or cancer infiltration Multifactorial, could be due to CML and occasional alcohol intake  She received 3 doses of erythropoietin  stimulating agent with stabilization of her blood count and she has not needed it for over 5 months Observe closely for now

## 2024-01-24 NOTE — Assessment & Plan Note (Addendum)
 Her serum creatinine fluctuated a little bit She is not symptomatic Recommend increase oral fluid hydration

## 2024-01-24 NOTE — Assessment & Plan Note (Addendum)
 The patient wasCML since 2008 She was placed on imatinib  with major molecular response She had a trial of discontinuation of imatinib  in 2018 but had relapse and was placed back on imatinib  with complete response She had bone marrow biopsy done in 2024 due to progressive pancytopenia which showed no evidence of CML She will continue imatinib  and has attained major molecular response  For now, she will continue imatinib  indefinitely I plan to recheck her labs again in 3 months

## 2024-02-11 ENCOUNTER — Encounter: Payer: Self-pay | Admitting: Internal Medicine

## 2024-02-11 DIAGNOSIS — H04123 Dry eye syndrome of bilateral lacrimal glands: Secondary | ICD-10-CM | POA: Diagnosis not present

## 2024-02-11 DIAGNOSIS — H16223 Keratoconjunctivitis sicca, not specified as Sjogren's, bilateral: Secondary | ICD-10-CM | POA: Diagnosis not present

## 2024-02-11 LAB — OPHTHALMOLOGY REPORT-SCANNED

## 2024-02-27 DIAGNOSIS — N1832 Chronic kidney disease, stage 3b: Secondary | ICD-10-CM | POA: Diagnosis not present

## 2024-02-27 DIAGNOSIS — Z809 Family history of malignant neoplasm, unspecified: Secondary | ICD-10-CM | POA: Diagnosis not present

## 2024-02-27 DIAGNOSIS — D8481 Immunodeficiency due to conditions classified elsewhere: Secondary | ICD-10-CM | POA: Diagnosis not present

## 2024-02-27 DIAGNOSIS — D84821 Immunodeficiency due to drugs: Secondary | ICD-10-CM | POA: Diagnosis not present

## 2024-02-27 DIAGNOSIS — C9511 Chronic leukemia of unspecified cell type, in remission: Secondary | ICD-10-CM | POA: Diagnosis not present

## 2024-03-09 ENCOUNTER — Other Ambulatory Visit: Payer: Self-pay | Admitting: Internal Medicine

## 2024-04-28 ENCOUNTER — Inpatient Hospital Stay: Attending: Hematology and Oncology

## 2024-04-28 DIAGNOSIS — C9210 Chronic myeloid leukemia, BCR/ABL-positive, not having achieved remission: Secondary | ICD-10-CM | POA: Diagnosis present

## 2024-04-28 DIAGNOSIS — D631 Anemia in chronic kidney disease: Secondary | ICD-10-CM

## 2024-04-28 LAB — CMP (CANCER CENTER ONLY)
ALT: 20 U/L (ref 0–44)
AST: 31 U/L (ref 15–41)
Albumin: 4.7 g/dL (ref 3.5–5.0)
Alkaline Phosphatase: 53 U/L (ref 38–126)
Anion gap: 9 (ref 5–15)
BUN: 37 mg/dL — ABNORMAL HIGH (ref 8–23)
CO2: 29 mmol/L (ref 22–32)
Calcium: 10.3 mg/dL (ref 8.9–10.3)
Chloride: 104 mmol/L (ref 98–111)
Creatinine: 1.27 mg/dL — ABNORMAL HIGH (ref 0.44–1.00)
GFR, Estimated: 41 mL/min — ABNORMAL LOW
Glucose, Bld: 98 mg/dL (ref 70–99)
Potassium: 4.4 mmol/L (ref 3.5–5.1)
Sodium: 142 mmol/L (ref 135–145)
Total Bilirubin: 0.5 mg/dL (ref 0.0–1.2)
Total Protein: 7.5 g/dL (ref 6.5–8.1)

## 2024-04-28 LAB — CBC WITH DIFFERENTIAL/PLATELET
Abs Immature Granulocytes: 0.01 K/uL (ref 0.00–0.07)
Basophils Absolute: 0 K/uL (ref 0.0–0.1)
Basophils Relative: 1 %
Eosinophils Absolute: 0.1 K/uL (ref 0.0–0.5)
Eosinophils Relative: 2 %
HCT: 27.1 % — ABNORMAL LOW (ref 36.0–46.0)
Hemoglobin: 9.3 g/dL — ABNORMAL LOW (ref 12.0–15.0)
Immature Granulocytes: 0 %
Lymphocytes Relative: 23 %
Lymphs Abs: 1 K/uL (ref 0.7–4.0)
MCH: 37.8 pg — ABNORMAL HIGH (ref 26.0–34.0)
MCHC: 34.3 g/dL (ref 30.0–36.0)
MCV: 110.2 fL — ABNORMAL HIGH (ref 80.0–100.0)
Monocytes Absolute: 0.7 K/uL (ref 0.1–1.0)
Monocytes Relative: 16 %
Neutro Abs: 2.4 K/uL (ref 1.7–7.7)
Neutrophils Relative %: 58 %
Platelets: 141 K/uL — ABNORMAL LOW (ref 150–400)
RBC: 2.46 MIL/uL — ABNORMAL LOW (ref 3.87–5.11)
RDW: 13.5 % (ref 11.5–15.5)
WBC: 4.1 K/uL (ref 4.0–10.5)
nRBC: 0 % (ref 0.0–0.2)

## 2024-05-06 LAB — BCR-ABL1, CML/ALL, PCR, QUANT
E1A2 Transcript: <0.0032 %
Interpretation (BCRAL):: NEGATIVE
b2a2 transcript: <0.0032 %
b3a2 transcript: <0.0032 %

## 2024-05-08 ENCOUNTER — Inpatient Hospital Stay: Attending: Hematology and Oncology | Admitting: Hematology and Oncology

## 2024-05-08 VITALS — BP 131/68 | HR 81 | Temp 99.1°F | Resp 18 | Ht 61.0 in | Wt 112.4 lb

## 2024-05-08 DIAGNOSIS — D631 Anemia in chronic kidney disease: Secondary | ICD-10-CM | POA: Insufficient documentation

## 2024-05-08 DIAGNOSIS — D539 Nutritional anemia, unspecified: Secondary | ICD-10-CM | POA: Diagnosis not present

## 2024-05-08 DIAGNOSIS — C9210 Chronic myeloid leukemia, BCR/ABL-positive, not having achieved remission: Secondary | ICD-10-CM | POA: Diagnosis not present

## 2024-05-08 DIAGNOSIS — N189 Chronic kidney disease, unspecified: Secondary | ICD-10-CM | POA: Insufficient documentation

## 2024-05-08 DIAGNOSIS — D6959 Other secondary thrombocytopenia: Secondary | ICD-10-CM

## 2024-05-08 DIAGNOSIS — T50905A Adverse effect of unspecified drugs, medicaments and biological substances, initial encounter: Secondary | ICD-10-CM | POA: Diagnosis not present

## 2024-05-08 DIAGNOSIS — D696 Thrombocytopenia, unspecified: Secondary | ICD-10-CM | POA: Diagnosis not present

## 2024-05-08 NOTE — Assessment & Plan Note (Addendum)
 She has multifactorial anemia, anemia of chronic kidney disease and related to side effects of imatinib  She is not symptomatic Observe only

## 2024-05-08 NOTE — Assessment & Plan Note (Addendum)
 The patient wasCML since 2008 She was placed on imatinib  with major molecular response She had a trial of discontinuation of imatinib  in 2018 but had relapse and was placed back on imatinib  with complete response She had bone marrow biopsy done in 2024 due to progressive pancytopenia which showed no evidence of CML She will continue imatinib  and has attained major molecular response  For now, she will continue imatinib  indefinitely I plan to recheck her labs again in 3 months

## 2024-05-08 NOTE — Assessment & Plan Note (Addendum)
This is likely due to recent treatment. The patient denies recent history of bleeding such as epistaxis, hematuria or hematochezia. She is asymptomatic from the low platelet count. I will observe for now.  she does not require transfusion now. I will continue the chemotherapy at current dose without dosage adjustment.  If the thrombocytopenia gets progressive worse in the future, I might have to delay her treatment or adjust the chemotherapy dose.   

## 2024-05-08 NOTE — Progress Notes (Signed)
 [facility] OFFICE PROGRESS NOTE  Patient Care Team: Geofm Glade PARAS, MD as PCP - General (Internal Medicine) Lavona Agent, MD as PCP - Cardiology (Cardiology) Melodi Lerner, MD as Consulting Physician (Orthopedic Surgery) Lonn Hicks, MD as Consulting Physician (Hematology and Oncology) Cleatus Collar, MD as Consulting Physician (Ophthalmology)  Assessment & Plan Chronic myeloid leukemia [facility]) The patient wasCML since 2008 She was placed on imatinib  with major molecular response She had a trial of discontinuation of imatinib  in 2018 but had relapse and was placed back on imatinib  with complete response She had bone marrow biopsy done in 2024 due to progressive pancytopenia which showed no evidence of CML She will continue imatinib  and has attained major molecular response  For now, she will continue imatinib  indefinitely I plan to recheck her labs again in 3 months Thrombocytopenia due to drugs This is likely due to recent treatment. The patient denies recent history of bleeding such as epistaxis, hematuria or hematochezia. She is asymptomatic from the low platelet count. I will observe for now.  she does not require transfusion now. I will continue the chemotherapy at current dose without dosage adjustment.  If the thrombocytopenia gets progressive worse in the future, I might have to delay her treatment or adjust the chemotherapy dose.   Deficiency anemia She has multifactorial anemia, anemia of chronic kidney disease and related to side effects of imatinib  She is not symptomatic Observe only  No orders of the defined types were placed in this encounter.    Hicks Lonn, MD  INTERVAL HISTORY: she returns for surveillance follow-up for myeloproliferative disorder/neoplasm Patient denies recent bleeding such as epistaxis, hematuria or hematochezia No recent infection We reviewed medication list and discussed medication changes We discussed test results and  future plan of care as outlined above  PHYSICAL EXAMINATION: ECOG PERFORMANCE STATUS: 0 - Asymptomatic  Vitals:   05/08/24 1156  BP: 131/68  Pulse: 81  Resp: 18  Temp: 99.1 F (37.3 C)  SpO2: 100%   Lab Results  Component Value Date   WBC 4.1 04/28/2024   HGB 9.3 (L) 04/28/2024   HCT 27.1 (L) 04/28/2024   MCV 110.2 (H) 04/28/2024   PLT 141 (L) 04/28/2024

## 2024-06-04 ENCOUNTER — Ambulatory Visit: Payer: Medicare PPO

## 2024-06-12 ENCOUNTER — Ambulatory Visit

## 2024-08-07 ENCOUNTER — Inpatient Hospital Stay

## 2024-08-17 ENCOUNTER — Inpatient Hospital Stay: Admitting: Hematology and Oncology
